# Patient Record
Sex: Female | Born: 1940
Health system: Southern US, Community
[De-identification: ages and names within clinical notes are randomized; demographics above are authoritative.]

## PROBLEM LIST (undated history)

## (undated) DIAGNOSIS — M51369 Other intervertebral disc degeneration, lumbar region without mention of lumbar back pain or lower extremity pain: Secondary | ICD-10-CM

## (undated) DIAGNOSIS — D1803 Hemangioma of intra-abdominal structures: Secondary | ICD-10-CM

## (undated) DIAGNOSIS — G629 Polyneuropathy, unspecified: Secondary | ICD-10-CM

## (undated) DIAGNOSIS — Z9889 Other specified postprocedural states: Secondary | ICD-10-CM

## (undated) DIAGNOSIS — I10 Essential (primary) hypertension: Secondary | ICD-10-CM

## (undated) DIAGNOSIS — M5136 Other intervertebral disc degeneration, lumbar region: Secondary | ICD-10-CM

## (undated) DIAGNOSIS — K5790 Diverticulosis of intestine, part unspecified, without perforation or abscess without bleeding: Secondary | ICD-10-CM

## (undated) DIAGNOSIS — C801 Malignant (primary) neoplasm, unspecified: Secondary | ICD-10-CM

## (undated) DIAGNOSIS — K429 Umbilical hernia without obstruction or gangrene: Secondary | ICD-10-CM

## (undated) DIAGNOSIS — G5760 Lesion of plantar nerve, unspecified lower limb: Secondary | ICD-10-CM

## (undated) DIAGNOSIS — E785 Hyperlipidemia, unspecified: Secondary | ICD-10-CM

## (undated) DIAGNOSIS — D682 Hereditary deficiency of other clotting factors: Secondary | ICD-10-CM

## (undated) DIAGNOSIS — D649 Anemia, unspecified: Secondary | ICD-10-CM

## (undated) DIAGNOSIS — Z9289 Personal history of other medical treatment: Secondary | ICD-10-CM

## (undated) DIAGNOSIS — G4700 Insomnia, unspecified: Secondary | ICD-10-CM

## (undated) DIAGNOSIS — F419 Anxiety disorder, unspecified: Secondary | ICD-10-CM

## (undated) DIAGNOSIS — K5792 Diverticulitis of intestine, part unspecified, without perforation or abscess without bleeding: Secondary | ICD-10-CM

## (undated) DIAGNOSIS — T8859XA Other complications of anesthesia, initial encounter: Secondary | ICD-10-CM

## (undated) DIAGNOSIS — S92901A Unspecified fracture of right foot, initial encounter for closed fracture: Secondary | ICD-10-CM

## (undated) DIAGNOSIS — M1990 Unspecified osteoarthritis, unspecified site: Secondary | ICD-10-CM

## (undated) DIAGNOSIS — R112 Nausea with vomiting, unspecified: Secondary | ICD-10-CM

## (undated) DIAGNOSIS — J189 Pneumonia, unspecified organism: Secondary | ICD-10-CM

## (undated) DIAGNOSIS — M797 Fibromyalgia: Secondary | ICD-10-CM

## (undated) DIAGNOSIS — Z8719 Personal history of other diseases of the digestive system: Secondary | ICD-10-CM

## (undated) DIAGNOSIS — T4145XA Adverse effect of unspecified anesthetic, initial encounter: Secondary | ICD-10-CM

## (undated) DIAGNOSIS — F32A Depression, unspecified: Secondary | ICD-10-CM

## (undated) DIAGNOSIS — N816 Rectocele: Secondary | ICD-10-CM

## (undated) DIAGNOSIS — E611 Iron deficiency: Secondary | ICD-10-CM

## (undated) DIAGNOSIS — K589 Irritable bowel syndrome without diarrhea: Secondary | ICD-10-CM

## (undated) DIAGNOSIS — K219 Gastro-esophageal reflux disease without esophagitis: Secondary | ICD-10-CM

## (undated) DIAGNOSIS — B2790 Infectious mononucleosis, unspecified without complication: Secondary | ICD-10-CM

## (undated) DIAGNOSIS — F329 Major depressive disorder, single episode, unspecified: Secondary | ICD-10-CM

## (undated) DIAGNOSIS — N3010 Interstitial cystitis (chronic) without hematuria: Secondary | ICD-10-CM

## (undated) HISTORY — DX: Anemia, unspecified: D64.9

## (undated) HISTORY — DX: Gastro-esophageal reflux disease without esophagitis: K21.9

## (undated) HISTORY — PX: RIGHT OOPHORECTOMY: SHX2359

## (undated) HISTORY — DX: Iron deficiency: E61.1

## (undated) HISTORY — DX: Depression, unspecified: F32.A

## (undated) HISTORY — DX: Insomnia, unspecified: G47.00

## (undated) HISTORY — DX: Malignant (primary) neoplasm, unspecified: C80.1

## (undated) HISTORY — PX: CHOLECYSTECTOMY: SHX55

## (undated) HISTORY — DX: Irritable bowel syndrome, unspecified: K58.9

## (undated) HISTORY — DX: Diverticulitis of intestine, part unspecified, without perforation or abscess without bleeding: K57.92

## (undated) HISTORY — PX: OOPHORECTOMY: SHX86

## (undated) HISTORY — DX: Umbilical hernia without obstruction or gangrene: K42.9

## (undated) HISTORY — PX: SHOULDER SURGERY: SHX246

## (undated) HISTORY — PX: TONSILLECTOMY: SUR1361

## (undated) HISTORY — PX: HIATAL HERNIA REPAIR: SHX195

## (undated) HISTORY — DX: Hemangioma of intra-abdominal structures: D18.03

## (undated) HISTORY — PX: GANGLION CYST EXCISION: SHX1691

## (undated) HISTORY — DX: Major depressive disorder, single episode, unspecified: F32.9

## (undated) HISTORY — DX: Other intervertebral disc degeneration, lumbar region without mention of lumbar back pain or lower extremity pain: M51.369

## (undated) HISTORY — DX: Lesion of plantar nerve, unspecified lower limb: G57.60

## (undated) HISTORY — DX: Anxiety disorder, unspecified: F41.9

## (undated) HISTORY — PX: HEMORRHOID SURGERY: SHX153

## (undated) HISTORY — DX: Unspecified fracture of right foot, initial encounter for closed fracture: S92.901A

## (undated) HISTORY — DX: Infectious mononucleosis, unspecified without complication: B27.90

## (undated) HISTORY — DX: Other intervertebral disc degeneration, lumbar region: M51.36

## (undated) HISTORY — DX: Diverticulosis of intestine, part unspecified, without perforation or abscess without bleeding: K57.90

## (undated) HISTORY — PX: LAPAROSCOPIC INCISIONAL / UMBILICAL / VENTRAL HERNIA REPAIR: SUR789

## (undated) HISTORY — PX: HERNIA REPAIR: SHX51

## (undated) HISTORY — DX: Rectocele: N81.6

## (undated) HISTORY — PX: LAPAROSCOPIC ESOPHAGOGASTRIC FUNDOPLASTY: SUR767

## (undated) HISTORY — DX: Essential (primary) hypertension: I10

## (undated) HISTORY — DX: Hyperlipidemia, unspecified: E78.5

## (undated) HISTORY — DX: Hereditary deficiency of other clotting factors: D68.2

## (undated) HISTORY — DX: Unspecified osteoarthritis, unspecified site: M19.90

## (undated) HISTORY — DX: Polyneuropathy, unspecified: G62.9

## (undated) HISTORY — DX: Interstitial cystitis (chronic) without hematuria: N30.10

## (undated) SURGERY — COLONOSCOPY WITH PROPOFOL
Anesthesia: Monitor Anesthesia Care

---

## 1978-05-04 HISTORY — PX: OTHER SURGICAL HISTORY: SHX169

## 1993-05-04 DIAGNOSIS — S92901A Unspecified fracture of right foot, initial encounter for closed fracture: Secondary | ICD-10-CM

## 1993-05-04 HISTORY — DX: Unspecified fracture of right foot, initial encounter for closed fracture: S92.901A

## 1997-05-04 DIAGNOSIS — D1803 Hemangioma of intra-abdominal structures: Secondary | ICD-10-CM

## 1997-05-04 HISTORY — DX: Hemangioma of intra-abdominal structures: D18.03

## 1997-08-29 ENCOUNTER — Other Ambulatory Visit: Admission: RE | Admit: 1997-08-29 | Discharge: 1997-08-29 | Payer: Self-pay | Admitting: Internal Medicine

## 1998-02-15 ENCOUNTER — Ambulatory Visit (HOSPITAL_COMMUNITY): Admission: RE | Admit: 1998-02-15 | Discharge: 1998-02-15 | Payer: Self-pay | Admitting: *Deleted

## 1998-06-27 ENCOUNTER — Other Ambulatory Visit: Admission: RE | Admit: 1998-06-27 | Discharge: 1998-06-27 | Payer: Self-pay | Admitting: *Deleted

## 1998-08-29 ENCOUNTER — Emergency Department (HOSPITAL_COMMUNITY): Admission: EM | Admit: 1998-08-29 | Discharge: 1998-08-29 | Payer: Self-pay | Admitting: Emergency Medicine

## 1998-08-29 ENCOUNTER — Encounter: Payer: Self-pay | Admitting: Emergency Medicine

## 1999-04-08 ENCOUNTER — Encounter: Payer: Self-pay | Admitting: *Deleted

## 1999-04-08 ENCOUNTER — Ambulatory Visit (HOSPITAL_COMMUNITY): Admission: RE | Admit: 1999-04-08 | Discharge: 1999-04-08 | Payer: Self-pay | Admitting: *Deleted

## 1999-04-11 ENCOUNTER — Emergency Department (HOSPITAL_COMMUNITY): Admission: EM | Admit: 1999-04-11 | Discharge: 1999-04-11 | Payer: Self-pay | Admitting: Emergency Medicine

## 1999-04-11 ENCOUNTER — Encounter: Payer: Self-pay | Admitting: Emergency Medicine

## 1999-04-15 ENCOUNTER — Ambulatory Visit (HOSPITAL_COMMUNITY): Admission: RE | Admit: 1999-04-15 | Discharge: 1999-04-15 | Payer: Self-pay | Admitting: General Surgery

## 1999-06-02 ENCOUNTER — Encounter: Admission: RE | Admit: 1999-06-02 | Discharge: 1999-06-02 | Payer: Self-pay | Admitting: Internal Medicine

## 1999-06-02 ENCOUNTER — Encounter: Payer: Self-pay | Admitting: Internal Medicine

## 1999-07-31 ENCOUNTER — Other Ambulatory Visit: Admission: RE | Admit: 1999-07-31 | Discharge: 1999-07-31 | Payer: Self-pay | Admitting: *Deleted

## 1999-09-08 ENCOUNTER — Encounter: Admission: RE | Admit: 1999-09-08 | Discharge: 1999-12-07 | Payer: Self-pay | Admitting: Internal Medicine

## 1999-10-28 ENCOUNTER — Encounter: Admission: RE | Admit: 1999-10-28 | Discharge: 2000-01-26 | Payer: Self-pay | Admitting: Orthopedic Surgery

## 2000-04-29 ENCOUNTER — Encounter: Payer: Self-pay | Admitting: *Deleted

## 2000-04-29 ENCOUNTER — Ambulatory Visit (HOSPITAL_COMMUNITY): Admission: RE | Admit: 2000-04-29 | Discharge: 2000-04-29 | Payer: Self-pay | Admitting: *Deleted

## 2000-06-13 ENCOUNTER — Ambulatory Visit (HOSPITAL_BASED_OUTPATIENT_CLINIC_OR_DEPARTMENT_OTHER): Admission: RE | Admit: 2000-06-13 | Discharge: 2000-06-13 | Payer: Self-pay | Admitting: Internal Medicine

## 2000-06-24 ENCOUNTER — Ambulatory Visit (HOSPITAL_COMMUNITY): Admission: RE | Admit: 2000-06-24 | Discharge: 2000-06-24 | Payer: Self-pay | Admitting: Internal Medicine

## 2000-06-24 ENCOUNTER — Encounter: Payer: Self-pay | Admitting: Internal Medicine

## 2000-08-31 ENCOUNTER — Ambulatory Visit (HOSPITAL_BASED_OUTPATIENT_CLINIC_OR_DEPARTMENT_OTHER): Admission: RE | Admit: 2000-08-31 | Discharge: 2000-08-31 | Payer: Self-pay | Admitting: Orthopaedic Surgery

## 2000-10-22 ENCOUNTER — Other Ambulatory Visit: Admission: RE | Admit: 2000-10-22 | Discharge: 2000-10-22 | Payer: Self-pay | Admitting: *Deleted

## 2001-07-29 ENCOUNTER — Encounter: Admission: RE | Admit: 2001-07-29 | Discharge: 2001-07-29 | Payer: Self-pay | Admitting: Internal Medicine

## 2001-07-29 ENCOUNTER — Encounter: Payer: Self-pay | Admitting: Internal Medicine

## 2001-12-05 ENCOUNTER — Other Ambulatory Visit: Admission: RE | Admit: 2001-12-05 | Discharge: 2001-12-05 | Payer: Self-pay | Admitting: Obstetrics and Gynecology

## 2002-02-28 ENCOUNTER — Ambulatory Visit (HOSPITAL_COMMUNITY): Admission: RE | Admit: 2002-02-28 | Discharge: 2002-02-28 | Payer: Self-pay | Admitting: Obstetrics and Gynecology

## 2002-02-28 ENCOUNTER — Encounter: Payer: Self-pay | Admitting: Obstetrics and Gynecology

## 2002-04-10 ENCOUNTER — Encounter (INDEPENDENT_AMBULATORY_CARE_PROVIDER_SITE_OTHER): Payer: Self-pay

## 2002-04-10 ENCOUNTER — Ambulatory Visit (HOSPITAL_COMMUNITY): Admission: RE | Admit: 2002-04-10 | Discharge: 2002-04-10 | Payer: Self-pay | Admitting: Gastroenterology

## 2003-03-07 ENCOUNTER — Ambulatory Visit (HOSPITAL_COMMUNITY): Admission: RE | Admit: 2003-03-07 | Discharge: 2003-03-07 | Payer: Self-pay | Admitting: Obstetrics and Gynecology

## 2003-03-20 ENCOUNTER — Other Ambulatory Visit: Admission: RE | Admit: 2003-03-20 | Discharge: 2003-03-20 | Payer: Self-pay | Admitting: Obstetrics and Gynecology

## 2003-05-05 DIAGNOSIS — K5792 Diverticulitis of intestine, part unspecified, without perforation or abscess without bleeding: Secondary | ICD-10-CM

## 2003-05-05 DIAGNOSIS — K5790 Diverticulosis of intestine, part unspecified, without perforation or abscess without bleeding: Secondary | ICD-10-CM

## 2003-05-05 HISTORY — DX: Diverticulitis of intestine, part unspecified, without perforation or abscess without bleeding: K57.92

## 2003-05-05 HISTORY — DX: Diverticulosis of intestine, part unspecified, without perforation or abscess without bleeding: K57.90

## 2003-05-08 ENCOUNTER — Encounter (INDEPENDENT_AMBULATORY_CARE_PROVIDER_SITE_OTHER): Payer: Self-pay | Admitting: Specialist

## 2003-05-08 ENCOUNTER — Ambulatory Visit (HOSPITAL_COMMUNITY): Admission: RE | Admit: 2003-05-08 | Discharge: 2003-05-08 | Payer: Self-pay | Admitting: Orthopaedic Surgery

## 2003-05-08 ENCOUNTER — Ambulatory Visit (HOSPITAL_BASED_OUTPATIENT_CLINIC_OR_DEPARTMENT_OTHER): Admission: RE | Admit: 2003-05-08 | Discharge: 2003-05-08 | Payer: Self-pay | Admitting: Orthopaedic Surgery

## 2003-09-18 ENCOUNTER — Observation Stay (HOSPITAL_COMMUNITY): Admission: RE | Admit: 2003-09-18 | Discharge: 2003-09-19 | Payer: Self-pay | Admitting: Obstetrics and Gynecology

## 2003-09-18 ENCOUNTER — Encounter (INDEPENDENT_AMBULATORY_CARE_PROVIDER_SITE_OTHER): Payer: Self-pay | Admitting: Specialist

## 2003-10-11 ENCOUNTER — Encounter: Admission: RE | Admit: 2003-10-11 | Discharge: 2004-01-09 | Payer: Self-pay | Admitting: Internal Medicine

## 2004-03-28 ENCOUNTER — Ambulatory Visit (HOSPITAL_COMMUNITY): Admission: RE | Admit: 2004-03-28 | Discharge: 2004-03-28 | Payer: Self-pay | Admitting: Obstetrics and Gynecology

## 2005-05-14 ENCOUNTER — Ambulatory Visit (HOSPITAL_COMMUNITY): Admission: RE | Admit: 2005-05-14 | Discharge: 2005-05-14 | Payer: Self-pay | Admitting: Obstetrics and Gynecology

## 2005-05-22 ENCOUNTER — Ambulatory Visit (HOSPITAL_COMMUNITY): Admission: RE | Admit: 2005-05-22 | Discharge: 2005-05-22 | Payer: Self-pay | Admitting: Internal Medicine

## 2006-05-17 ENCOUNTER — Ambulatory Visit (HOSPITAL_COMMUNITY): Admission: RE | Admit: 2006-05-17 | Discharge: 2006-05-17 | Payer: Self-pay | Admitting: Obstetrics and Gynecology

## 2006-06-23 ENCOUNTER — Encounter: Admission: RE | Admit: 2006-06-23 | Discharge: 2006-06-23 | Payer: Self-pay | Admitting: Internal Medicine

## 2006-08-21 ENCOUNTER — Encounter: Admission: RE | Admit: 2006-08-21 | Discharge: 2006-08-21 | Payer: Self-pay | Admitting: Orthopaedic Surgery

## 2006-10-29 ENCOUNTER — Encounter: Admission: RE | Admit: 2006-10-29 | Discharge: 2006-10-29 | Payer: Self-pay | Admitting: Gastroenterology

## 2007-04-11 ENCOUNTER — Ambulatory Visit (HOSPITAL_COMMUNITY): Admission: RE | Admit: 2007-04-11 | Discharge: 2007-04-11 | Payer: Self-pay | Admitting: General Surgery

## 2007-05-10 ENCOUNTER — Encounter: Admission: RE | Admit: 2007-05-10 | Discharge: 2007-05-10 | Payer: Self-pay | Admitting: Gastroenterology

## 2007-05-26 ENCOUNTER — Ambulatory Visit (HOSPITAL_COMMUNITY): Admission: RE | Admit: 2007-05-26 | Discharge: 2007-05-26 | Payer: Self-pay | Admitting: Obstetrics and Gynecology

## 2007-06-01 ENCOUNTER — Ambulatory Visit (HOSPITAL_COMMUNITY): Admission: RE | Admit: 2007-06-01 | Discharge: 2007-06-01 | Payer: Self-pay | Admitting: General Surgery

## 2007-06-22 ENCOUNTER — Encounter (INDEPENDENT_AMBULATORY_CARE_PROVIDER_SITE_OTHER): Payer: Self-pay | Admitting: General Surgery

## 2007-06-23 ENCOUNTER — Inpatient Hospital Stay (HOSPITAL_COMMUNITY): Admission: AD | Admit: 2007-06-23 | Discharge: 2007-06-27 | Payer: Self-pay | Admitting: General Surgery

## 2007-07-01 ENCOUNTER — Inpatient Hospital Stay (HOSPITAL_COMMUNITY): Admission: AD | Admit: 2007-07-01 | Discharge: 2007-07-09 | Payer: Self-pay | Admitting: General Surgery

## 2007-07-13 ENCOUNTER — Emergency Department (HOSPITAL_COMMUNITY): Admission: EM | Admit: 2007-07-13 | Discharge: 2007-07-14 | Payer: Self-pay | Admitting: Emergency Medicine

## 2007-07-17 ENCOUNTER — Inpatient Hospital Stay (HOSPITAL_COMMUNITY): Admission: EM | Admit: 2007-07-17 | Discharge: 2007-07-26 | Payer: Self-pay | Admitting: Emergency Medicine

## 2007-07-17 ENCOUNTER — Ambulatory Visit: Payer: Self-pay | Admitting: Internal Medicine

## 2007-07-20 ENCOUNTER — Encounter (INDEPENDENT_AMBULATORY_CARE_PROVIDER_SITE_OTHER): Payer: Self-pay | Admitting: Surgery

## 2007-10-31 ENCOUNTER — Encounter: Admission: RE | Admit: 2007-10-31 | Discharge: 2007-10-31 | Payer: Self-pay | Admitting: Gastroenterology

## 2008-05-30 IMAGING — XA IR REPLACE G/J TUBE W/ FLUORO
1 series · 2 of 2 positions shown · non-contrast
Comparison: none

CLINICAL DATA: Gastrojejunostomy tube is occluded.  
 GASTROJEJUNOSTOMY TUBE EXCHANGE 07/21/07 AT 4991 HOURS:
 Procedure:  The gastrojejunostomy tube was prepped and draped in a sterile fashion and lidocaine was utilized for local anesthesia.  It was cut and exchanged over a stiff glidewire for a new 28 French gastrojejunostomy tube.  The tip was positioned in the jejunum.  The gastric tip was positioned in the antrum of the stomach.  Contrast was injected into each port opacifying the stomach and jejunum.  No complications.

[Series 1000: run · 0.15mm/px · 2 of 2 slices shown]
[im 1/2]
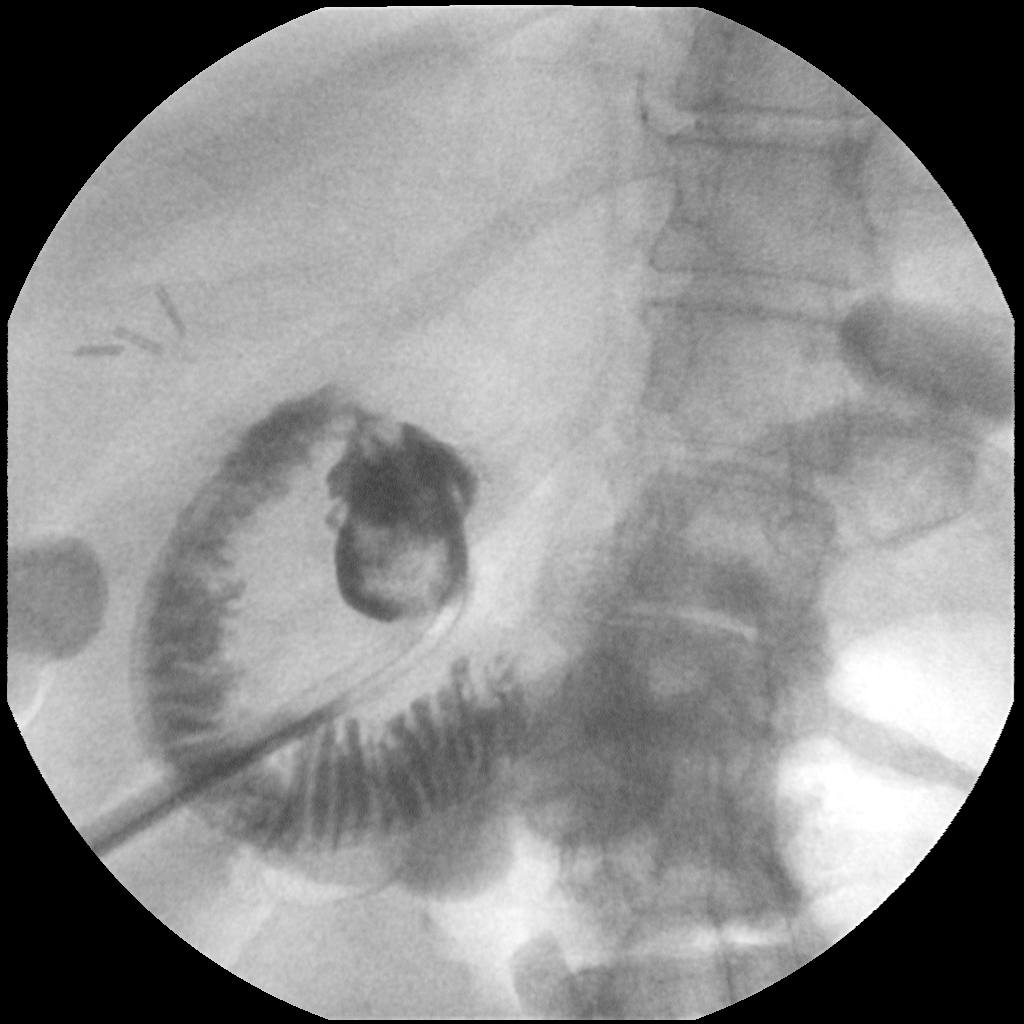
[im 2/2]
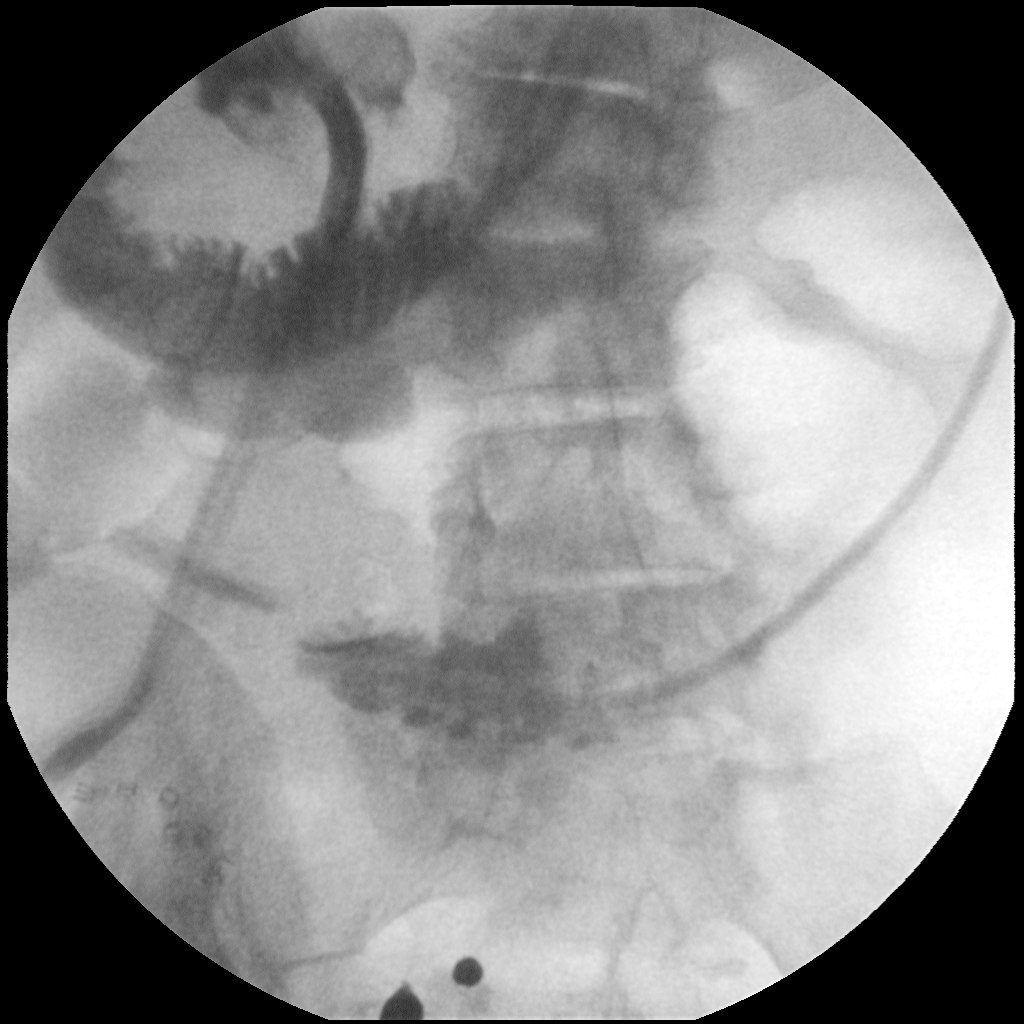

[2 of 2 positions shown; findings below may reference images not displayed]

FINDINGS: The image demonstrates exchange of the gastrojejunostomy tube.
IMPRESSION: Successful gastrojejunostomy exchange.

## 2008-10-18 ENCOUNTER — Ambulatory Visit (HOSPITAL_COMMUNITY): Admission: RE | Admit: 2008-10-18 | Discharge: 2008-10-18 | Payer: Self-pay | Admitting: Obstetrics and Gynecology

## 2009-08-16 ENCOUNTER — Ambulatory Visit (HOSPITAL_BASED_OUTPATIENT_CLINIC_OR_DEPARTMENT_OTHER): Admission: RE | Admit: 2009-08-16 | Discharge: 2009-08-16 | Payer: Self-pay | Admitting: Urology

## 2010-03-03 ENCOUNTER — Ambulatory Visit (HOSPITAL_COMMUNITY): Admission: RE | Admit: 2010-03-03 | Discharge: 2010-03-03 | Payer: Self-pay | Admitting: Obstetrics and Gynecology

## 2010-05-04 DIAGNOSIS — K429 Umbilical hernia without obstruction or gangrene: Secondary | ICD-10-CM

## 2010-05-04 HISTORY — DX: Umbilical hernia without obstruction or gangrene: K42.9

## 2010-05-25 ENCOUNTER — Encounter: Payer: Self-pay | Admitting: Obstetrics and Gynecology

## 2010-05-25 ENCOUNTER — Encounter: Payer: Self-pay | Admitting: Gastroenterology

## 2010-07-22 LAB — GLUCOSE, CAPILLARY: Glucose-Capillary: 106 mg/dL — ABNORMAL HIGH (ref 70–99)

## 2010-07-22 LAB — POCT I-STAT 4, (NA,K, GLUC, HGB,HCT)
Hemoglobin: 14.3 g/dL (ref 12.0–15.0)
Sodium: 140 mEq/L (ref 135–145)

## 2010-09-16 NOTE — Op Note (Signed)
[name], Gross                 ACCOUNT NO.:  [id number]   MEDICAL RECORD NO.:  [id number]          PATIENT TYPE:  INP   LOCATION:  1335                         FACILITY:  [facility]   PHYSICIAN:  Clovis Pu. Cornett, M.D.DATE OF BIRTH:  1941-03-05   DATE OF PROCEDURE:  07/20/2007  DATE OF DISCHARGE:                               OPERATIVE REPORT   PREOPERATIVE DIAGNOSIS:  Anal canal and rectal pain.   POSTOPERATIVE DIAGNOSIS:  Thrombosed grade 3 prolapsed internal and  external hemorrhoids involving the left lateral column of the anal  canal.   PROCEDURE:  1. Exam under anesthesia.  2. Excision of thrombosed grade 3 left lateral internal/external      thrombosed hemorrhoids.   SURGEON:  Maisie Fus A. Cornett, M.D.   ANESTHESIA:  General anesthesia with 0.25% local anesthesia.   ESTIMATED BLOOD LOSS:  50 mL.   SPECIMEN:  Hemorrhoidal tissue which was showing signs of thrombosis  from the left lateral posterior column of the anal canal.   INDICATIONS FOR PROCEDURE:  The patient is a 70 year old female who  underwent a laparoscopic hiatal hernia repair with placement of a  gastrostomy tube last month by Dr. Avel Peace.  She was admitted  early Sunday morning with abdominal pain and no bowel movement for seven  days.  I was asked to see her by the internal medicine doctor covering  her for that day for that.  She was also complaining of rectal pain.  Her abdominal pain had gone away, but she still had issues of  constipation but she was having no nausea, vomiting, or abdominal pain  at that point.  She was complaining mostly of rectal pain.  On  examination, she had a column of hemorrhoidal disease that looked  inflamed but did not appear thrombosed on external examination.  We  elected to manage her medically for a couple of days to see if we could  help with this, but her pain did not improve and I recommended exam  under anesthesia to exclude an abscess or any necrotic  internal  component that I could not see on exam.  She is brought to the operating  room today for persistent anal pain.   DESCRIPTION OF PROCEDURE:  The patient was brought to the operating room  and placed supine.  After induction of general anesthesia, she was  placed in lithotomy and the perianal canal was prepped and draped in a  sterile fashion.  Of note, she had a tremendous amount of hard  inspissated stool in the rectum which I irrigated and went ahead and  flushed this out initially.  Once I was able to irrigate out her rectum,  she had a very large pile of thrombosed grade 3 prolapsed left lateral  internal and external hemorrhoids.  I felt that excising this would help  with examination with the anoscope.  I placed a stitch in the apex of  the hemorrhoid and then used a large curved Mayo, laid these flat so as  to not cut the internal sphincter mechanism, and excised this area to  include a  thrombosed external component with it once we opened this.  After removing the tissue and clot, we oversewed it with 3-0 Monocryl.  Upon examination, there was another cluster of grade 2 hemorrhoids that  actually had clotted with an internal component in the right posterior  region.  I placed a rubber band on this under direct vision.  There was  still some swelling in this area, probably from hematoma, and I  oversewed this with 3-0 Monocryl with adequate result.  The remainder of  her anal canal examination did not reveal any other significant  abnormality.  She had some hemorrhoidal tissue in the right lateral  region and this did not appear to be an issue today.  After removing the  anoscope, I placed some gauze packing, waited about five minutes,  rechecked it, and there was no signs of bleeding.  I then infiltrated  the region with 2% lidocaine jelly and placed a packing consisting of  Gelfoam and Surgicel in the anal canal.  Hemostasis was excellent.  All  final counts of sponge,  needle and instruments were found to be correct  this portion of the case.  The patient was then awakened and taken to  recovery in satisfactory condition.      Thomas A. Cornett, M.D.  Electronically Signed     TAC/MEDQ  D:  07/20/2007  T:  07/20/2007  Job:  [id number]   cc:   Thora Lance, M.D.  Fax: [phone]   Adolph Pollack, M.D.  [address], [address]  [place]  [place] [address]

## 2010-09-16 NOTE — H&P (Signed)
Annette, Gross                 ACCOUNT NO.:  [id number]   MEDICAL RECORD NO.:  [id number]          PATIENT TYPE:  OIB   LOCATION:  1528                         FACILITY:  [facility]   PHYSICIAN:  Adolph Pollack, M.D.DATE OF BIRTH:  [birth year]   DATE OF ADMISSION:  06/22/2007  DATE OF DISCHARGE:                              HISTORY & PHYSICAL   REASON:  Repair of large hiatal hernia.   HISTORY OF PRESENT ILLNESS:  Annette Gross is a 70 year old female who has  had a longstanding hiatal hernia.  She has been becoming more  symptomatic with intermittent dysphagia and reflux.  Proton pump  inhibitors have helped her reflux,  but she still has intermittent  feelings of dysphagia.  An upper GI demonstrated a combination of a  sliding and paraesophageal hernia without any complicating features.  At  least 50% of the stomach was involved in the hernia.  A 13-mm tablet did  pass through easily.  No evidence of volvulus.  We attempted to get  manometry, but because of the way hernia was the probe could not be  placed in the proper position.  I did get a gastric-emptying study on  her, and this showed  some delayed emptying.  I sent her back to Dr.  Laural Benes, but he felt that she did not need to be started on Reglan.  I  had long discussions with her.  The plan is for a laparoscopic hiatal  hernia repair with fundoplication and placement of a gastrostomy.  The  procedure and the risks have been discussed with her at length  preoperatively.   PAST MEDICAL HISTORY:  1. Hypertension.  2. Longstanding hiatal hernia with gastroesophageal reflux disease.  3. Type 2 diabetes.  4. Hypercholesterolemia.  5. 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  6. Asthma, bronchitis.  7. History of factor V deficiency although she has not had any      bleeding with recent surgeries.  8. Depression.  9. Mononucleosis.  10.Iron deficiency anemia.  11.Hepatic hemangioma.   PREVIOUS  OPERATIONS:  1. Tonsillectomy.  2. Hemorrhoidectomy.  3. Cholecystectomy.  4. Left oophorectomy.  5. Laparoscopic umbilical hernia repair.  6. Right shoulder surgery.  7. Removal of right ganglion cyst.  8. Right salpingo-oophorectomy.   ALLERGIES:  X-RAY DYE.  ALSO SHE HAS INTOLERANCE TO SHE STATES VICODIN  AND OXYCODONE.  SHE ALSO GETS ITCHING WITH SULFA MEDICINES.   MEDICATIONS:  Diovan HCT, Zyrtec, diclofenac, Protonix, Tylenol,  multivitamin, vitamin C, aspirin, albuterol inhaler, Darvocet as needed  for pain.   SOCIAL HISTORY:  She is married and works as a Runner, broadcasting/film/video for [employer].  No tobacco or alcohol use.   FAMILY HISTORY:  Notable for heart disease.   PHYSICAL EXAMINATION:  VITAL SIGNS:  Temperature is 98 degrees, pulse  102, blood pressure 111/68.  GENERAL:  An overweight female.  She is in no acute distress, pleasant  and cooperative.  HEENT:  Extraocular motions intact.  No icterus.  NECK:  Supple without masses.  RESPIRATORY:  The breath sounds are equal  and clear.  Respirations are  unlabored.  CARDIOVASCULAR:  Demonstrates regular rate, regular rhythm.  No murmur.  ABDOMEN:  Soft.  Small scars noted.  Nontender, nondistended.  No  organomegaly or masses.  EXTREMITIES:  SCDs on.   IMPRESSION:  Large longstanding hiatal hernia with some delayed gastric  emptying, which likely is chronic although she does not have symptoms of  this.   PLAN:  Laparoscopic, possible open, hiatal hernia repair with Nissen  fundoplication and gastrostomy.      Adolph Pollack, M.D.  Electronically Signed     TJR/MEDQ  D:  06/22/2007  T:  06/23/2007  Job:  [id number]

## 2010-09-16 NOTE — H&P (Signed)
NAME:  Annette Gross, Annette Gross                 ACCOUNT NO.:  [id number]   MEDICAL RECORD NO.:  [id number]          PATIENT TYPE:  INP   LOCATION:                               FACILITY:  [facility]   PHYSICIAN:  Donalynn Furlong, MD      DATE OF BIRTH:  [birth year]   DATE OF ADMISSION:  07/16/2007  DATE OF DISCHARGE:                              HISTORY & PHYSICAL   PRIMARY CARE PHYSICIAN:  Thora Lance, M.D.   CHIEF COMPLAINT:  Rectal pain and nausea along with urinary complaints.   HISTORY OF PRESENT ILLNESS:  Ms. Blunck is a 70 year old female who  presented to the ER tonight with the complaint of rectal pain starting  at 10:00 yesterday morning. She mentioned that she was hurting in her  rectum every time when she tried to pass bowel movement and felt like  she is having hemorrhoids. She does have a history of hemorrhoids. She  mentioned that she had no bowel movements for the last seven days; that  is why she took some laxatives and suppository the night before  yesterday, and she had some bowel movement yesterday morning when it  started hurting in her rectum. She did have lower abdominal cramps at  the same time. She complained of nausea. She mentions that she had some  burning pain while passing urine but no fever, chills, or other  symptoms. She was treated for UTI with Cipro by Dr. Abbey Chatters about a  week ago. She finished the course of therapy but did not get better. She  saw Dr. Abbey Chatters on Friday, and she received one antibiotic (name not  known) from him. In the ER, the patient got one dose of IV Rocephin. The  patient is stable at this time. The patient denies any cough, shortness  of breath, chest pain, headache, or other symptoms.   PAST MEDICAL HISTORY:  1. Chronic gastroparesis with large hiatal hernia.  2. Type 2 diabetes mellitus.  3. Hypertension.  4. Hyperlipidemia.  5. Degenerative disease of back with spinal stenosis.  6. Asthma/bronchitis.  7. History of Factor V  deficiency.  8. Depression.  9. Iron-deficiency anemia.  10.Hepatic angioma.   PAST SURGERIES:  1. Tonsillectomy.  2. Hemorrhoidectomy.  3. Cholecystectomy.  4. Left oophorectomy.  5. Laparoscopic umbilical hernia repair.  6. Right shoulder surgery.  7. Removal of right ganglion cyst.  8. Right salpingo-oophorectomy.  9. She did have surgery for hiatal hernia which was laparoscopic      hiatal hernia repair with Nissen fundoplication gastrostomy and J-      tube placement in the month of February.   FAMILY HISTORY:  Nothing contributory.   SOCIAL HISTORY:  The patient is married. Lives with her husband. Works  as a Runner, broadcasting/film/video in [employer]. No alcohol, drug,  tobacco use.   ALLERGIES:  INCLUDE CONTRAST MEDIA, SULFA, CODEINE, OXYCODONE,  HYDROCODONE.   REVIEW OF SYSTEMS:  Positive as per HPI. Otherwise negative review of  systems done for 14 systems.   PHYSICAL EXAMINATION:  VITAL SIGNS:  Blood pressure 147/88, pulse  117,  respirations 18, temperature 98.2, oxygen saturation 100% on room air.  GENERAL EXAM:  Alert and oriented x3. Not in acute distress.  HEAD:  Normocephalic, nontraumatic. EYES:  Pupils are equal, round, and  reactive to light and accommodation. Extraocular muscles intact. ORAL  CAVITY:  Oral mucosa moist. No thrush noted.  NECK:  No thyromegaly or JVD.  CARDIOVASCULAR:  S1/S2 regular. Tachycardia. No murmur, rub, gallop  appreciated.  RESPIRATORY SYSTEM:  Lungs clear to auscultation bilaterally. Good  bilateral air entry.  ABDOMEN:  Nondistended. The patient is obese. Mild tenderness over the  lower part of the abdomen over the suprapubic area. No organomegaly. J  tube identified. No guarding or rigidity.  EXTREMITIES:  No clubbing, cyanosis, or edema. Pulses palpable in all  four extremities.  SKIN:  No rash or bruises.  NEUROLOGIC EXAM:  Shows intact cranial nerves, sensation, and reflexes.  PSYCHIATRIC EXAM:  Shows no depression  or anxiety.   LABORATORY DATA:  WBC 14.7, hemoglobin 13.5, platelets 553. Sodium 141,  potassium 3.7, chloride 98, bicarbonate 32, glucose 166, BUN 33,  creatinine 0.83, calcium 10, total protein 7.5, albumin 3.7, AST 34, ALT  29, alkaline phosphatase 113, bilirubin 0.7. GFR more than 60. Urine  showed too numerous to count and many bacteria. Urine color is amber,  appearance turbid, along with moderate hemoglobin, trace ketones, total  protein 100, nitrite positive, leukocyte esterase large. CT abdomen and  pelvis obtained which does not show any acute abnormal findings. CT  abdomen and pelvis reading shows no intestinal obstruction. X-ray of  abdomen shows no acute findings. Chest x-ray shows no pneumonia.  Unremarkable bowel-gas pattern on the abdominal x-ray.   ASSESSMENT AND PLAN:  1. Urinary tract infection.  2. Rectal pain with possible hemorrhoids. No blood in the stool noted.  3. Chronic gastroparesis.  4. Hypertension.  5. Diabetes mellitus.  6. Degenerative joint disease.  7. Hyperlipidemia.  8. Asthma/bronchitis.  9. Factor V deficiency.  10.Depression.  11.Iron-deficiency anemia.   PLAN:  We will admit patient to telemetry bed under Dr. Kirby Funk. We  will check CBC, CMP, magnesium, and phosphorus in the morning. We will  check blood culture, urinalysis, urine culture now. We will continue IV  Rocephin which was started by ER physician. We will start IV hydration  with normal saline at 60 mL per hour. We will give pain medication,  Darvocet-N at home dose. Will give Protonix, Zyrtec also. Will start  Lovenox for DVT prophylaxis. We will consult Dr. Abbey Chatters in the  morning. Further workup according to the lab pending.      Donalynn Furlong, MD  Electronically Signed     TVP/MEDQ  D:  07/17/2007  T:  07/17/2007  Job:  [id number]   cc:   Thora Lance, M.D.  Fax: [phone]

## 2010-09-16 NOTE — Op Note (Signed)
[name], Gross                 ACCOUNT NO.:  [id number]   MEDICAL RECORD NO.:  [id number]          PATIENT TYPE:  OIB   LOCATION:  1528                         FACILITY:  [facility]   PHYSICIAN:  Adolph Pollack, M.D.DATE OF BIRTH:  08/19/1940   DATE OF PROCEDURE:  06/22/2007  DATE OF DISCHARGE:                               OPERATIVE REPORT   PREOPERATIVE DIAGNOSIS:  Large hiatal hernia and gastroesophageal reflux  disease.   POSTOPERATIVE DIAGNOSIS:  Large hiatal hernia and gastroesophageal  reflux disease.   PROCEDURE:  1. Laparoscopic hiatal hernia repair with Nissen fundoplication.  2. Upper endoscopy.  3. Laparoscopic gastrostomy (#24-French Foley).   SURGEON:  Adolph Pollack, M.D.   ASSISTANT:  Ollen Gross. Vernell Morgans, M.D.   ANESTHESIA:  General.   INDICATION:  This 70 year old female has been suffering with a large  hiatal hernia for a very long time.  She is becoming increasingly  symptomatic from it.  She now presents for the above procedures.   TECHNIQUE:  She is seen in the holding area and brought to the operating  and placed supine on the operating table.  General anesthetic was  administered.  Abdominal wall sterilely prepped and draped and Foley  catheter inserted.  An epigastric incision was made through the skin and  subcutaneous tissue.  The fascia was then divided entering the  peritoneal cavity.  She has very thin, attenuated fascia.  A pursestring  suture of 0-0 Vicryl placed around the fascial edges.  A Hasson trocar  was introduced to the peritoneal cavity and pneumoperitoneum created by  insufflation of CO2 gas.   Next, a laparoscope was introduced.  I then placed an 11 mm trocar in  the right upper quadrant and one just lateral to the midline on the  right and also two in the left upper quadrant region.  A 5 mm trocar was  placed in the subxiphoid region and a self-retaining Nathanson's liver  retractor was inserted and the left lobe of liver  was held up.  Left  lobe of the liver was very floppy.  Also of note was right around the  hiatus there was a hepatic hemangioma.   I began reducing the stomach from the hernia and a least 50 to 60% of  stomach was up into the chest.  I divided the thin gastrohepatic  ligament with a harmonic scalpel heading toward the right crus.  A large  amount of sac was adherent to the right crus made of a lot of fibrofatty  tissue which was quite vascular.  I carefully used the harmonic scalpel  to dissect this free and removed part of it with some small samples.  I  then did dissected some of the sac free anteriorly using the harmonic  scalpel and identified the anterior aspect of the left crus.  There is a  lot of sac between the stomach and the left crus.  I used the harmonic  scalpel to divide the sac and remove it in small sample type pieces.  The stomach appeared to be fairly flaccid and atonic in  the part that I  was reducing.   Next, I identified the greater curvature and made sure it was in proper  orientation.  I then divided short gastric vessels off the fundus of  stomach up to the angle of His region.  I then identified posterior sac  attachments to the stomach and used harmonic scalpel to divide these and  removed them in small sample sizes as well.  The sac was fairly vascular  and we had of some intermittent bleeding from this which was controlled  with harmonic scalpel.  I then approached the right crus and during  trying to mobilize some of the right crus, I noted hemangioma and it  started bleeding some.  I controlled this with cautery and Surgicel.  Also noted was there was a small abrasion on the anterior aspect of  liver from one of the instruments.  The bleeding was controlled with  electrocautery.  However, during the case I noted a very small leakage  of bile from this area.  This stopped with cautery and a piece of  Surgicel.  There was a very superficial abrasion to the  surface of the  liver.   Following this I was then able to create a retroesophageal window.  I  saw more sac attachments between the mediastinum and the stomach and I  divided these with a harmonic scalpel and remove them.  I then had all  of the stomach reduced and no further sac attachments.   Following this I then repaired the hiatus.  I used a size 0-0  nonabsorbable pledgeted sutures and put five sutures in which resulted  in a good closure.   Upper endoscopy was performed visualizing esophagus and no perforation  was noted.  She had diffuse gastritis.  I then evacuated all the air and  removed the endoscope.   Following this, a size 56 lighted bougie was then passed into the  esophagus and stomach under laparoscopic vision.  I performed a 360  degree fundoplication passing the fundus posteriorly at the  gastroesophageal junction.  This was done using three stitches, the  proximal two of which were clipped to be able to identify them on  further x-ray needed.  The bougie was removed intact and the wrap was  loose and under no tension.  I then irrigated out the area and evacuated  fluid.  I examined the spleen and liver.  There was no further bleeding.  No further bile leak noted.  However because of the bleeding from the  hemangioma and some of bile leakage, I decided to go ahead and leave a  drain in the right side area and thus a 19 Blake drain was inserted  through the lateral 11 mm trocar in the right side, placed appropriately  and then anchored to the skin with 3-0 nylon suture.   Following this I released some of the pneumoperitoneum.  I chose a spot  in the left upper quadrant and made incision through the skin,  subcutaneous tissue and fascia.  Using a Babcock, I grabbed part of the  body of the stomach and brought it up through the small wound.  I then  placed a pursestring suture of 2-0 silk around the stomach.  I then  performed a four-quadrant gastropexy using  interrupted 2-0 silk to the  posterior abdominal wall.  A gastrotomy was made, a 24-French Foley with  5 mL balloon was then inserted through the gastrotomy to the stomach.  The  pursestring suture was tightened down, the balloon insufflated.  I  then tied down the gastropexy sutures as well.   I then pulled up the gastrostomy tube until the balloon was snug against  the pursestring suture.  I subsequently closed the small fascial defect  around the gastrostomy with interrupted 0-0 Vicryl sutures.  I then  anchored to the gastrostomy to the skin partially closing the wound also  with two 3-0 nylon sutures.   Following this I reinsufflated the abdomen and hemostasis was adequate.  Gastrostomy looked good with good gastropexy.  I then released the CO2  gas and removed the remaining trocars.   The epigastric fascial defect was closed by tightening up and tying down  the pursestring suture.  The remaining skin incisions were closed with  staples followed by a sterile dressings.   She tolerated the procedure well without any apparent complications and  was taken to recovery room in satisfactory condition.      Adolph Pollack, M.D.  Electronically Signed     TJR/MEDQ  D:  06/22/2007  T:  06/23/2007  Job:  [id number]   cc:   Thora Lance, M.D.  Fax: [phone]   Danise Edge, M.D.  Fax: [phone]

## 2010-09-16 NOTE — Discharge Summary (Signed)
Annette Gross, Annette Gross                 ACCOUNT NO.:  [id number]   MEDICAL RECORD NO.:  [id number]          PATIENT TYPE:  INP   LOCATION:  1335                         FACILITY:  [facility]   PHYSICIAN:  Thora Lance, M.D.  DATE OF BIRTH:  March 16, 1941   DATE OF ADMISSION:  07/16/2007  DATE OF DISCHARGE:  07/26/2007                               DISCHARGE SUMMARY   REASON FOR ADMISSION:  A 70 year old white female who presented  complaining of rectal pain of 24 hours duration.  She had severe pain on  trying to pass a bowel movement.  She had no bowel movement for the last  7 days and had taken laxatives and a suppository the night before  and  then began having pain in the rectum, abdominal cramps and pain with  defecation.  She also had burning pain on urination but no fever, chills  or other symptoms.  She had been treated for a UTI by Dr. Abbey Chatters 1  week prior with Cipro.  She had finished a course of therapy but had not  gotten better.   SIGNIFICANT FINDINGS:  Blood pressure 147/88, heart rate 117,  respirations 18, temperature 98.2.  ABDOMEN:  Nondistended.  Mild tenderness in the left lower quadrant and  over the suprapubic area.  No organomegaly.  A GJ tube was in place.   LABORATORY:  WBC 14.7, hemoglobin 13.5, platelet count 553, sodium 141,  potassium 3.7, chloride 98, bicarbonate 32, glucose 166, BUN 33,  creatinine 0.8, calcium 10, total protein 7.5, albumin 3.7, AST 34, ALT  29, alkaline phosphatase 113, total bilirubin 0.7.  UA showed too  numerous to count WBC, many bacteria.  CT scan of the pelvis in the ER  did not show any acute abdominal findings.  Chest x-ray was no acute  disease.   HOSPITAL COURSE:  1. UTI.  The patient was started on Rocephin for her UTI.  Her urine      culture which had been done as an outpatient proved to grow an E-      coli which was resistant to ciprofloxacin.  The patient was treated      with 1 week of Rocephin and had a documented  clearing of her urine      culture.  Her Foley was discontinued 4 days prior to discharge,      although had to be briefly replaced because of some bladder spasm      and difficulty urinating.  It was then rediscontinued 1 day prior      to discharge and that she had no trouble urinating in the last 24      hours.  2. Constipation.  The patient had significant constipation prior to      being admitted.  She was nauseated.  She was treated with MiraLAX,      Senokot, and Colace and eventually began having bowel movements and      then diarrhea.  She was also treated briefly with IV Reglan.      Because of the diarrhea the patient's laxatives were eventually  stopped.  C.  Difficile antigen was negative.  The patient's      diarrhea resolved and at discharge she had had a small bowel      movement 1 day prior to discharge.  3. Hemorrhoids.  The patient had a thrombosed hemorrhoid.  She was      seen by Dr. Doristine Counter of surgical service.  She was treated with      ProctoFoam HC and sitz baths.  She continued to have rectal pain      and was taken to the OR on July 19, 2007 and had a      hemorrhoidectomy.  Afterwards she was treated with sitz baths and      local care.  Her hemorrhoid pain gradually improved but was still      having some at discharge and will be treated with lidocaine jelly      p.r.n.  4. Tube feeding.  The patient had had a hiatal hernia repair several      weeks prior to admission.  After the surgery she had severe nausea      and was unable to tolerate a diet so a G J tube had been placed by      Dr. Abbey Chatters.  During the hospitalization she was fed through the      jejunal tube.  She did have some nausea initially but this did      improve by discharge.  She was seen by Dr. Laural Benes her      gastroenterologist who had noted that she had a preop and postop      nuclear medicine scan showing 80% gastric retention at 2 hours.      Dr. Henriette Combs actions were to stop  her laxatives because they were      causing diarrhea.  The patient was started on IV Reglan for nausea      relief and gastric stimulation.  She was placed on a regular diet      which she tolerated well.  On the day prior to discharge her      jejunal tube feedings were discontinued.  On two occasions her J      tube was proved to be blocked and had to be repositioned and      flushed by interventional radiology.  5. Hypertension.  The patient's Diovan HCT was held during the      admission.  The patient's blood pressure remained normal and this      will 9 be held and restarted likely as an outpatient.  6. Diabetes mellitus type 2.  The patient's blood sugars generally      remained under pretty good control.  At one point they got into the      high 100's and her J-tube feedings were switched to Glucerna with      good control in the low 100s thereafter.  She is diet controlled      diabetic.   DISCHARGE DIAGNOSES:  1. Urinary tract infection.  2. Constipation.  3. Thrombosed external hemorrhoid.  4. Diabetic gastroparesis.  5. Diabetes mellitus.  6. Hypertension.  7. Gastroesophageal reflux disease with a stable large hiatal hernia      surgically repaired. 8.  Hyperlipidemia.  8. Degenerative disc disease of the back.  9. Asthma.  10.Depression.  11.Iron-deficiency anemia.   PROCEDURES:  One column hemorrhoidectomy.   DISCHARGE MEDICATIONS:  1. Protonix 40 mg one p.o. daily.  2. Zyrtec 10 mg one p.o.  daily.  3. MiraLAX 17 grams in water daily b.i.d. p.r.n. for constipation.  4. Reglan 10 mg 30 minutes before each meal.  5. Colace 100 mg twice a day.  6. Senokot 2-4 each p.m. as needed for constipation.  7. Lidocaine 2% jelly to anal area twice a day as needed.  8. Diovan HCT,  diclofenac and aspirin held at discharge.   DISPOSITION:  Discharged to home.   FOLLOW-UP:  With Dr. Valentina Lucks in 1 week.  With Dr. Abbey Chatters as  instructed by general surgery.   DIET:   Regular diet.   ACTIVITY:  As tolerated.   The GJ tube care per general surgery.  Local care for hemorrhoids per  general surgery.           ______________________________  Thora Lance, M.D.     JJG/MEDQ  D:  07/26/2007  T:  07/26/2007  Job:  [id number]   cc:   Danise Edge, M.D.  Fax: [phone]   Adolph Pollack, M.D.  [address]  [place]  [place] [address]   Clovis Pu. Cornett, M.D.  [address] [place] [address]  [place] [address]

## 2010-09-16 NOTE — Consult Note (Signed)
[name], Annette Gross                 ACCOUNT NO.:  [id number]   MEDICAL RECORD NO.:  [id number]          PATIENT TYPE:  INP   LOCATION:  1335                         FACILITY:  [facility]   PHYSICIAN:  Clovis Pu. Cornett, M.D.DATE OF BIRTH:  1941-01-14   DATE OF CONSULTATION:  07/17/2007  DATE OF DISCHARGE:                                 CONSULTATION   PHYSICIAN REQUESTING CONSULTATION:  Theressa Millard, M.D.   REASON FOR CONSULTATION:  Status post laparoscopic hiatal hernia repair  and G tube placement by Dr. Avel Peace last month with rectal pain,  constipation and abdominal pain, readmitted to the medical service  overnight.   HISTORY OF PRESENT ILLNESS:  The patient is a 70 year old female with  multiple medical problems.  Last month she underwent a laparoscopic  hiatal hernia repair by Dr. Avel Peace with placement of a  gastrostomy tube due to poor gastric emptying for a large hiatal hernia.  Her course was complicated.  She was discharged and readmitted for  failure to thrive.  She has problems with chronic constipation,  hemorrhoids and actually poor gastric emptying.  The G tube was  exchanged on March 4 for a feeding gastrojejunostomy tube to try to feed  her distal to her stomach.  She states she has had no bowel movement for  7 days.  She presented to the emergency department last night with  severe abdominal pain.  CT was obtained which showed no acute process.  Her feeding tube was in an appropriate position, her stomach with the  balloon inflated, contrast went past this with contrast in her small  bowel at the tip of her feeding tube in the jejunum.  No evidence of  perforation, free air abscess, or any complicating problem from her  surgery it looked like.  She did not have any significant small-bowel  dilation her or actually colonic dilation.  There is stool in her colon,  but this did not seem extensive to me.  In any event, she has been  getting tube feeds at  home.  She has had significant rectal  pain and  what appeared to be a urinary tract infection and was admitted to the  medical service.  I was asked to see her to help sort out some of these  issues to see if there is anything else to offer from the standpoint of  her complaint.  Currently, she denies any significant abdominal pain.  No evidence of nausea, vomiting, fever or chills.  She is complaining of  rectal pain.  This is severe in nature.  She has had a longstanding  history of hemorrhoids.  She has tried some Preparation H but nothing  else.   PAST MEDICAL HISTORY:  1. Hypertension.  2. Type 2 diabetes mellitus.  3. Elevated cholesterol.  4. Degenerative joint disease.  5. Obesity.  6. Aspirin.  7. Factor V deficiency.  8 . Depression.  1. History of internal hemorrhoids.  2. Iron deficiency anemia.  3. Hepatic angioma.   PAST SURGICAL HISTORY:  1. Laparoscopic hiatal hernia repair with NG tube placement  by Dr.      Abbey Chatters in February 2009.  2. Ventral hernia repair as mentioned  3. Hemorrhoidectomy.  4. Tonsillectomy.  5. Cholecystectomy.  6. Oophorectomy and bilateral salpingo-oophorectomy.   FAMILY HISTORY:  Significant for peripheral vascular disease.   ALLERGIES:  X-RAY DYE, VICODIN, OXYCODONE, and SULFA.   MEDICATIONS:  Include Diovan, hydrochlorothiazide, Zyrtec, diclofenac,  Protonix, Tylenol, multivitamin, vitamin C.   FAMILY HISTORY:  Noncontributory.   REVIEW OF SYSTEMS:  As stated above, otherwise 15 points negative.   PHYSICAL EXAMINATION:  VITAL SIGNS:  Temperature is 97, blood pressure  149/85, respiratory rate 22.  GENERAL:  White female in no apparent distress.  HEENT:  Extraocular movements are intact.  Pupils are equal, round, and  reactive to light.  Oropharynx clear.  NECK:  Supple, nontender.  No mass.  CHEST:  Clear to auscultation.  Chest wall motion normal.  CARDIOVASCULAR:  Regular rate and rhythm without rub, murmur or  gallop.  EXTREMITIES:  Warm, well perfused.  ABDOMEN:  Soft, nontender.  No rebound or guarding. Incision is well  healed.  Gastrostomy tube site clean, dry and intact.  EXTREMITIES:  No clubbing, cyanosis nor edema.  Muscle tone is  decreased.  RECTAL:  She does have significant internal and external hemorrhoid  disease. These are Grade 3.  These do reduce even though they are  swollen and engorged and inflamed appearing today.  I do not see any  evidence of pus or abscess at this point in time.   DIAGNOSTIC STUDIES:  I reviewed her abdominal and pelvic CT.  No acute  changes were noted.  Post surgical changes appear intact with no  problems from that standpoint.  Feeding tube is in the jejunum and  appears to be in good position without perforation.  Gastrostomy tube  was inflated without problem.   Laboratory studies show white count 9000 this morning.  Hemoglobin 12.  Platelet count 432,000.  Electrolytes within normal limits.  SGOT 330,  SGPT 312, normal bilirubin and a slightly elevated alkaline phosphatase  to 165.  UA shows signs of a urinary tract infection.   IMPRESSION:  1. Status post laparoscopic hiatal hernia repair with gastrostomy tube      in a 70 year old female with multiple medical problems to include      poor gastric emptying, chronic constipation, poor GI motility,      failure to thrive, now with 7-day history of no bowel movement,      elevated transaminase, poor feeding tolerance, and hemorrhoids.   PLAN:  At this point in time, will start Sitz baths, ProctoFoam for  local treatment of her hemorrhoids while she is in the hospital.  She  does have significant grade 3 disease which appears quite swollen and  inflamed today and would benefit from hemorrhoidectomy down road once  her medical issues are better controlled.  I do not see any evidence of  infection or abscess at this point.   As for her constipation, she needs to be placed on some laxatives and   may even benefit from a trial of Reglan to see if helping her stomach  empty helps some of her poor feeding.  She would benefit from a GI  consultation, and Dr. Laural Benes has seen her in the past for some of these  issues.   As for transaminases, I see no evidence of ductal dilatation.  She may  require gastroenterology's help on that issue as well.   I do not  see anything related to her surgery that is complicating her  care, and these appear to be mostly medical issues; but hopefully if we  can manage better, that will help her rectal pain and help her feel  better and be able to be discharged home.  I see no complicating factors  from her surgery at this point and have nothing further to add except to  treat the above issues.  Dr. Abbey Chatters is out of town.  I will follow  her in his absence.  Thank you for this consultation.      Thomas A. Cornett, M.D.  Electronically Signed     TAC/MEDQ  D:  07/17/2007  T:  07/17/2007  Job:  [id number]   cc:   Thora Lance, M.D.  Fax: [phone]   Adolph Pollack, M.D.  [address]  [place]  [place] [address]   Danise Edge, M.D.  Fax: [phone]

## 2010-09-16 NOTE — H&P (Signed)
Annette Gross, Annette Gross NO.:  [id number]   MEDICAL RECORD NO.:  [id number]          PATIENT TYPE:  INP   LOCATION:  5157                         FACILITY:  [facility]   PHYSICIAN:  Adolph Pollack, M.D.DATE OF BIRTH:  1941-04-30   DATE OF ADMISSION:  07/01/2007  DATE OF DISCHARGE:                              HISTORY & PHYSICAL   REASON:  Gastroparesis and failure to thrive.   HISTORY OF PRESENT ILLNESS:  Annette Gross is a 70 year old female who had  a longstanding (20 to 30 years) hiatal hernia, becoming increasingly  symptomatic with dysphasia, reflux.  At least 58% of her stomach was in  her chest/mediastinum.  She had known preoperative delayed gastric  emptying but had no symptoms of this.  She subsequently underwent a  laparoscopic repair of the large hiatal hernia with Nissen  fundoplication and a gastrostomy, June 22, 2007.  Postoperatively,  she was placed on Prokinetics, actually did well in and out of the  hospital and was at home when she started having increasing drainage  around her G-tube the past 2 days.  She states it has begun to look  somewhat like the food she is eating.  She is having BMs as well.  She  comes in today for evaluation to the office.  There have been no fever  or chills.   PAST MEDICAL HISTORY:  1. Large hiatal hernia with gastroesophageal reflux.  2. Delayed gastric emptying.  3. Type 2 diabetes.  4. Hypertension.  5. Hypercholesteremia.  6. Degenerative disease of the back with spinal stenosis.  7. Asthma and bronchitis.  8. History of factor 5 deficiency.  9. Depression.  10.Mononucleosis.  11.Iron deficiency anemia.  12.Hepatic hemangioma.   PREVIOUS OPERATIONS:  1. Tonsillectomy.  2. Hemorrhoidectomy.  3. Cholecystectomy.  4. Left oophorectomy.  5. Laparoscopic umbilical hernia repair.  6. Right shoulder surgery.  7. Removal of right ganglion cyst.  8. Right salpingo-oophorectomy.   ALLERGIES:  INCLUDE X-RAY  DIET, AS WELL AS SULFA, VICODIN, OXYCODONE,  AND CODEINE.   SOCIAL HISTORY:  She is married here with her husband.  Works as a  Runner, broadcasting/film/video for [employer].  No tobacco or alcohol  use.   PHYSICAL EXAM:  GENERAL:  Slightly weak female but very pleasant and  cooperative.  She is afebrile.  HEENT:  EYES:  Extraocular systemic no icterus.  RESPIRATORY:  Breath sounds equal and clear, respirations unlabored.  CARDIOVASCULAR:  Demonstrates a regular rate and regular rhythm.  I hear  no murmur.  ABDOMEN:  Soft with well-healed incisions.  There is a mild amount of  tenderness around the G-tube site with little redness.  There is some  gastric drainage coming out around the G-tube side.  The G-tube was  flushed and aspirated and is patent, and it returns gastric contents.  SKIN:  No jaundice.   IMPRESSION:  Postoperative gastroparesis,  in somebody who has known,  chronic delayed gastric emptying.  Also looks to have failure to thrive  at this time.   PLAN:  Will admit to the hospital and  re-hydrate her.  Will start her on  TNA with plans to cycle it.  Will place here G tube to gravity and give  G-tube wound care.  Will plan on repeating a gastric emptying scan in 3  days.  This been discussed with her and her husband at length.  I told  her there is a good chance she will need to be on prolonged parenteral  nutrition and even potentially in all some form of tube feeding to allow  the stomach to regain it's tone and start working well again.      Adolph Pollack, M.D.  Electronically Signed     TJR/MEDQ  D:  07/01/2007  T:  07/02/2007  Job:  [id number]   cc:   Danise Edge, M.D.  Thora Lance, M.D.

## 2010-09-16 NOTE — Discharge Summary (Signed)
Annette Gross, Annette Gross                 ACCOUNT NO.:  [id number]   MEDICAL RECORD NO.:  [id number]          PATIENT TYPE:  INP   LOCATION:  1528                         FACILITY:  [facility]   PHYSICIAN:  Adolph Pollack, M.D.DATE OF BIRTH:  [birth year]   DATE OF ADMISSION:  06/22/2007  DATE OF DISCHARGE:  06/27/2007                               DISCHARGE SUMMARY   FINAL DISCHARGE DIAGNOSIS:  Large hiatal hernia.   SECONDARY DIAGNOSES:  1. Chronic gastroparesis.  2. Hypertension.  3. Type 2 diabetes.  4. Degenerative joint disease.  5. Hypercholesterolemia.  6. Asthma.  7. Bronchitis.  8. History of  Factor V deficiency  9. Depression.  10.Iron-deficiency anemia.   PROCEDURE:  Laparoscopic hiatal hernia repair, Nissen fundoplication,  gastrostomy, upper endoscopy June 22, 2007.   INDICATION:  Annette Gross is a 70 year old female who has had a very  longstanding hiatal hernia.  She has intermittent dysphagia and reflux  that is progressively worsening.  She has had a complete evaluation.  An  upper GI showed at least 50% of the stomach into the mediastinum.  Manometry was unable to be done as the catheter was not able to be  positioned properly.  She has also known delayed gastric emptying.  Because of her progressive symptoms, she was admitted for elective  repair.   HOSPITAL COURSE:  She underwent the above procedure.  Postoperatively  she had some problems with urinary retention.  On postop day #1 upper GI  demonstrated no evidence of leak from the esophagus or stomach.  I  started her on some Reglan and erythromycin for delayed gastric  emptying.  A G tube was to gravity.  She had some nausea as well so I  was holding her diet.  The nausea passed, and I was able to start her on  a diet.  Foley was removed on the fourth postoperative day, and she  tolerated this fairly well.  She began to tolerate a full liquid diet.  I was able to clamp the G tube.  She had a drain left in,  which was  removed, and by her fifth postoperative day her staples were removed,  drain was removed, and she was able to be discharged with instructions.   DISPOSITION:  Discharged home June 27, 2007.  She was given specific  instructions on a discharge instruction sheet and some Darvocet for pain  as well as Reglan and erythromycin.  She was told that if she felt  nauseated and bloated she could go ahead and open up the G tube, which  is clamped, and drain it.  Plan to see her back in the office in about a  week.      Adolph Pollack, M.D.  Electronically Signed     TJR/MEDQ  D:  07/12/2007  T:  07/13/2007  Job:  [id number]   cc:   Danise Edge, M.D.  Fax: [phone]   Thora Lance, M.D.  Fax: [phone]

## 2010-09-19 NOTE — Op Note (Signed)
NAME:  TALICIA, SUI                           ACCOUNT NO.:  [id number]   MEDICAL RECORD NO.:  [id number]                   PATIENT TYPE:  AMB   LOCATION:  ENDO                                 FACILITY:  [facility]   PHYSICIAN:  Danise Edge, M.D.                DATE OF BIRTH:  [birth year]   DATE OF PROCEDURE:  04/10/2002  DATE OF DISCHARGE:                                 OPERATIVE REPORT   PROCEDURE:  Esophagogastroduodenoscopy, small bowel biopsy, and colonoscopy.   INDICATIONS:  The patient is a 70 year old female born 04/18/1941.  The patient  has unexplained iron-deficiency anemia based on a low serum ferritin, low  iron saturation, hemoglobin 9.5 g, with microcytic indices.   In 1999 the patient underwent a flexible proctosigmoidoscopy followed by air  contrast barium enema, which revealed left colonic diverticulosis.  An upper  GI small bowel follow-through x-ray series revealed a large hiatal hernia.  MRI of the liver revealed two hemangiomas.   A March 2003 CT scan of the abdomen and pelvis revealed a large hiatal  hernia, umbilical hernia, right ovarian cyst, and liver hemangiomas.   ENDOSCOPIST:  Danise Edge, M.D.   PREMEDICATION:  The patient received a total of 10 mg Versed and 80 mg of  Demerol for both procedures.   PROCEDURE:  Esophagogastroduodenoscopy with small bowel biopsies.   DESCRIPTION OF PROCEDURE:  After obtaining informed consent, the patient was  placed in the left lateral decubitus position.  I administered intravenous  Demerol and intravenous Versed to achieve conscious sedation for the  procedure.  The patient's blood pressure, oxygen saturation, and cardiac  rhythm were monitored throughout the procedure and documented in the medical  record.   The Olympus pediatric colonoscope was passed through the posterior pharynx  into the proximal esophagus without difficulty.  The hypopharynx and larynx  appeared normal.  I did not visualize the vocal  cords.   Esophagoscopy:  The proximal, mid-, and lower segments of the esophagus  appear normal.   Gastroscopy:  The patient has a large hiatal hernia.  Retroflexed view of  the gastric cardia and fundus was normal.  The gastric body, antrum, and  pylorus appeared normal.   Duodenoscopy:  The duodenal bulb, mid-duodenum, distal duodenum, and  proximal jejunum appeared normal.  Six biopsies were taken from the second-  third portions of the duodenum to rule out celiac sprue.   ASSESSMENT:  Large hiatal hernia, otherwise normal  esophagogastroduodenoscopy.  Small bowel biopsies are pending.   PROCEDURE:  Proctocolonoscopy with rectal polypectomy.   DESCRIPTION OF PROCEDURE:  Anal inspection was normal.  Digital rectal exam  was normal.  The Olympus pediatric video colonoscope was introduced into the  rectum and advanced to the cecum.  Colonic preparation for the exam today  was excellent.   Rectum:  From the distal rectum a 2 mm sessile polyp was removed with the  electrocautery snare and submitted for pathologic interpretation.   Sigmoid colon and descending colon reveal left colonic diverticulosis.   Splenic flexure normal.   Transverse colon normal.   Hepatic flexure normal.   Ascending colon normal.   Cecum and ileocecal valve normal.   ASSESSMENT:  1. Left colonic diverticulosis.  2. A 2 mm sessile polyp was removed from the distal rectum.                                               Danise Edge, M.D.    MJ/MEDQ  D:  04/10/2002  T:  04/10/2002  Job:  [id number]   cc:   Thora Lance, M.D.  [address]  [place]  [place] [address]  Fax: [phone]

## 2010-09-19 NOTE — Discharge Summary (Signed)
NAMEEMILIE, CARP                 ACCOUNT NO.:  [id number]   MEDICAL RECORD NO.:  [id number]          PATIENT TYPE:  INP   LOCATION:  5157                         FACILITY:  [facility]   PHYSICIAN:  Adolph Pollack, M.D.DATE OF BIRTH:  Sep 04, 1940   DATE OF ADMISSION:  07/01/2007  DATE OF DISCHARGE:  07/09/2007                               DISCHARGE SUMMARY   PRINCIPAL DISCHARGE DIAGNOSIS:  Postoperative gastroparesis.   SECONDARY DIAGNOSES:  1. Chronic delayed gastric emptying.  2. Type 2 diabetes.  3. Hypertension.  4. Hypercholesterolemia.  5. 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  6. Asthma.  7. Bronchitis.  8. Factor V deficiency.  9. Depression.  10.Mononucleosis.  11.Anemia.  12.Hepatic hemangioma.  13.Failure to thrive.  14.Protein-calorie malnutrition.   PROCEDURES:  Change of gastrostomy tube to gastrostomy/jejunostomy tube.   REASON FOR ADMISSION:  This is a 70 year old female who underwent a  laparoscopic repair of a chronic large hiatal hernia with Nissen  fundoplication on June 22, 2007 and did well until, she came to the  office with increasing drainage around her G tube and failure to thrive.  Because of this and her inability to hold to gain adequate nutrition,  she was admitted to the hospital.   HOSPITAL COURSE:  She is admitted to the hospital PICC line started.  She is on TNA, but she immediately got severe arthralgias and headaches  from that TNA.  Gastric emptying scan did demonstrated some delayed  gastric emptying as well.  Subsequently, the TNA was modified to take  the Intralipids out of the TNA and she tolerated this much better.  She  had a wound care consult and they were able to help control leakage  around the G tube.  She had interventional radiology do Gastrografin G-  tube study, which showed that the G tube was in adequate condition.  We  have tried her on Reglan and erythromycin, but this showed no  improvement and gastric emptying as she had 78% retention after 2 hours  which is of improvement.  On July 06, 2007, she had a gastrostomy tube  exchanged for gastrostomy/jejunostomy tube and a leakage around the G  tube resolved.  She was maintained on T&A and I had begun her on some  essential Intralipid replacements and then started J-tube feedings.  Eventually, we were able to get around a 16-hour J-tube feedings, which  she tolerated well.  By July 09, 2007, I was able to have her discharge  with home health nursing care as well as checking her blood glucoses at  home.   DISPOSITION:  Discharged to home with home health nursing care on July 09, 2007 on 16-hour a day jejunostomy feedings as well as free water  administration.  She was given specific discharge instructions.  She is  going to monitor her blood sugars and report these results to my office.  I will have her come back and see me in 6 days.      Adolph Pollack, M.D.  Electronically Signed     TJR/MEDQ  D:  08/16/2007  T:  08/17/2007  Job:  [id number]   cc:   Danise Edge, M.D.  Thora Lance, M.D.

## 2010-09-19 NOTE — Op Note (Signed)
NAME:  Annette Gross, Annette Gross                           ACCOUNT NO.:  [id number]   MEDICAL RECORD NO.:  [id number]                   PATIENT TYPE:  AMB   LOCATION:  DSC                                  FACILITY:  [facility]   PHYSICIAN:  Lubertha Basque. Jerl Santos, M.D.             DATE OF BIRTH:  Feb 05, 1941   DATE OF PROCEDURE:  05/08/2003  DATE OF DISCHARGE:                                 OPERATIVE REPORT   PREOPERATIVE DIAGNOSIS:  Right foot cyst.   POSTOPERATIVE DIAGNOSIS:  Right foot cyst.   PROCEDURE:  Excision of cyst, right foot.   ANESTHESIA:  Ankle block, MAC.   ATTENDING SURGEON:  Lubertha Basque. Jerl Santos, M.D.   ASSISTANT:  Lindwood Qua, P.A.   INDICATIONS FOR PROCEDURE:  The patient is a 70 year old woman with a long  history of a right foot cyst.  This has limited her shoe wear and caused  some discomfort.  She would like to have it removed.  She is offered  excision under ankle block anesthetic.  Informed operative consent was  obtained after a discussion of the possible complications of reaction to  anesthesia, infection, neurovascular injury and a 10% chance of recurrence.   DESCRIPTION OF PROCEDURE:  The patient was taken to the operating suite  where ankle block was applied along with some sedation.  She was positioned  supine and prepped and draped in a normal sterile fashion.  After the  administration of preoperative IV antibiotics, the right leg was elevated,  exsanguinated, and a tourniquet inflated about the calf.  A longitudinal  incision was made over her dorsolateral cyst with dissection down to the  cyst and an overlying vein.  The vein was ligated.  The cyst was then  removed.  This was a gelatinous-filled cyst about 1 cm in diameter with a  stalk which I also removed.  The tourniquet was deflated and the foot became  pink and warm immediately.  A small amount of bleeding was easily controlled  with Bovie cautery.  The wound was irrigated followed by re-approximation of  deep tissues with 2-0 undyed Vicryl and the skin with nylon.  Adaptic was  placed over the wound followed by dry gauze and a loose Ace wrap.  Estimated  blood loss and intraoperative fluids can be obtained from the anesthesia  records as can accurate tourniquet time.   DISPOSITION:  The patient was taken to the recovery room in stable  condition.  The plans were for her to go home the same day and to follow up  in the office in less than a week.  I will contact her by phone tonight.                                               Theron Arista  Janann Colonel, M.D.    PGD/MEDQ  D:  05/08/2003  T:  05/08/2003  Job:  [id number]

## 2010-09-19 NOTE — Op Note (Signed)
[place]  Patient:    Annette Gross, Annette Gross                        MRN: [id number] Proc. Date: 08/31/00 Adm. Date:  [id number] Attending:  Marcene Corning                           Operative Report  PREOPERATIVE DIAGNOSES: 1. Right shoulder rotator cuff tear. 2. Right shoulder impingement.  POSTOPERATIVE DIAGNOSES: 1. Right shoulder rotator cuff tear. 2. Right shoulder impingement.  PROCEDURES: 1. Right shoulder arthroscopic rotator cuff repair. 2. Right shoulder arthroscopic acromioplasty.  ANESTHESIA:  General and block.  SURGEON:  Lubertha Basque. Jerl Santos, M.D.  ASSISTANT:  Prince Rome, P.A.  INDICATION FOR PROCEDURE:  The patient is a 70 year old woman with a long history of right-dominant shoulder pain.  This has persisted despite oral anti-inflammatories and activity restriction.  She did respond in a transient way to a subacromial injection.  She underwent a preoperative MRI scan, which showed a small, retracted complete rotator cuff tear involving the supraspinatus and also some significant cuff degeneration and impingement. Planned procedure at this point is for arthroscopy.  The procedure was discussed with the patient, and informed operative consent was obtained after discussion of the possible complications of, reaction to anesthesia, and infection.  DESCRIPTION OF PROCEDURE:  The patient was taken to the operating suite, where a general anesthetic was applied without difficulty.  She was given a regional block in the preanesthesia area.  She was positioned in beach chair position and prepped and draped in normal sterile fashion.  After the administration of preoperative IV antibiotics, an arthroscopy of the right shoulder was performed through a total of three portals.  The glenohumeral joint showed no degenerative change, and the biceps tendon and all labral structures were well-attached.  The rotator cuff appeared at least  partially torn on undersurface inspection.  In the subacromial space, she had a prominent subacromial spur, which was addressed with a decompression back to a flat surface.  This was done with the bur in the lateral position, followed by transfer of the bur to the posterior position.  The [facility] joint was not addressed, as she had no pain in that area and no spur was seen.  The rotator cuff was thoroughly examined.  She did have a small tear, which measured about a centimeter across.  The tear was directly off the greater tuberosity.  This degenerative area was removed, and a bleeding bed of bone was created below with an arthroscopic bur.  A single bio-corkscrew anchor was placed with two Ethibond sutures emanating.  These were then passed through the rotator cuff free edge and used to tie this down in mattress fashion to the bleeding bed of bone.  We achieved a good, tight repair.  The shoulder was thoroughly irrigated at the end of the case, followed by placement of Marcaine with epinephrine and morphine.  Simple sutures of nylon were used to reapproximate the portals, followed by Adaptic and a dry gauze dressing with tape. Estimated blood loss and intraoperative fluids can be obtained from anesthesia records.  DISPOSITION:  The patient was extubated in the operating room and taken to the recovery room in stable condition.  Plans were for her to go home the same day and to follow up in the office in less than a week.  I will contact her by phone  tonight. DD:  08/31/00 TD:  08/31/00 Job: [id number]

## 2010-09-19 NOTE — Consult Note (Signed)
[place]  Patient:    Annette Gross, Annette Gross                          MRN: [id number] Proc. Date: 10/28/99 Attending:  Barry Dienes. Eloise Harman, M.D. Dictator:   Barry Dienes. Eloise Harman, M.D.                          Consultation Report  REQUESTING PHYSICIAN:  Dr. Kirby Funk.  REASON FOR CONSULTATION:  Left foot pain in a patient with diabetes.  HISTORY OF PRESENT ILLNESS:  The patient is a 70 year old white female with diabetes mellitus type 2, diagnosed in February 2001.  She is on diet and exercise to control her diabetes, and her last hemoglobin A1C was 7.5% on July 11, 1999.  She complains of pain in the distal lateral left foot dorsum for approximately one month.  The patient is increased with swimming or other activities, has a burning sensation to it, and is somewhat improved with rest.  PAST MEDICAL HISTORY: 1. Hypertension. 2. Asthma. 3. Degenerative disc disease in the lumbar spine. 4. Gastroesophageal reflux disease.  PAST SURGICAL HISTORY: 1. Remote tonsillectomy in 1991. 2. Cholecystectomy. 3. Hernia repair in 2000.  ALLERGIES:  SULFA, MORPHINE, CODEINE, SHRIMP.  MEDICATIONS: 1. AeroBid 2 puffs p.o. b.i.d. 2. Albuterol 2 puffs p.o. q.4h. p.r.n. 3. Zurtec 10 mg p.o. q.d. 4. Flonase 2 sprays each nostril q.d. 5. Serzone 150 mg p.o. b.i.d. 6. Prevacid 30 mg p.o. q.d. 7. Diovan 80 mg p.o. q.d. 8. Ocuvite one tablet p.o. q.d. 9. Lipitor 20 mg p.o. q.d.  PHYSICAL EXAMINATION:  The shape of her feet are normal, as are her pedal pulses.  She has normal sensation to testing with 5.07 weight not on filament.  There is mild onychomycosis affecting multiple toenails.  On the left foot between the third and fourth digits there is tenderness to deep palpation that reproduces the pain of her chief complaint.  There is no evidence of heavy callus formation or foot ulceration.  IMPRESSION:  Left foot pain likely due to Mortons neuroma, and less  likely due to diabetic peripheral neuropathy or stress fracture.  RECOMMENDATIONS:  A custom insert will be made for her shoes to decrease the pressure on the Mortons neuroma.  She would rather hold on other options such as corticosteroid local injection or surgical repair.  She will be seen in followup as necessary if her symptoms persist. DD:  10/28/99 TD:  10/28/99 Job: [id number]

## 2010-09-19 NOTE — Op Note (Signed)
NAME:  Annette Gross, Annette Gross                           ACCOUNT NO.:  [id number]   MEDICAL RECORD NO.:  [id number]                   PATIENT TYPE:  INP   LOCATION:  9310                                 FACILITY:  [facility]   PHYSICIAN:  Sherry A. Rosalio Macadamia, M.D.           DATE OF BIRTH:  [birth year]   DATE OF PROCEDURE:  09/18/2003  DATE OF DISCHARGE:                                 OPERATIVE REPORT   PREOPERATIVE DIAGNOSES:  1. Postmenopausal bleeding.  2. Right lower quadrant pain.  3. Enlarged right ovary.   POSTOPERATIVE DIAGNOSES:  1. Postmenopausal bleeding.  2. Right lower quadrant pain.  3. Enlarged right ovary.  4. Right retroperitoneal adnexal cyst.   SURGEONS:  Sherry A. Rosalio Macadamia, M.D., Genia Del, M.D., and Pershing Cox, M.D.   PROCEDURES:  1. Dilatation and curettage, hysteroscopy with resectoscope.  2. Open diagnostic laparoscopy with right salpingo-oophorectomy and removal     of right adnexal cyst.   ANESTHESIA:  General.   INDICATIONS:  This is a 70 year old G6, P4-0-2-4, woman who complained of  postmenopausal bleeding with small blood clots.  The patient stated she  could not tell whether this was from the vagina or from the uterus but felt  that this was probably from the uterus.  The patient had complaints of right  lower quadrant pain, for which she had an abdominal CT scan.  Abdominal CT  revealed a right ovarian cyst approximately 5 cm in size.  Ultrasound was  performed, which showed a normal-sized uterus with normal endometrium and  normal right ovary but because of the persistence of her right lower  quadrant and the abnormality found on CT scan, the patient is brought to the  operating room for [facility] hysteroscopy with resectoscope as well as diagnostic  laparoscopy and removal of right tube and ovary.   FINDINGS:  Normal-sized anteflexed uterus.  Surgically absent left ovary.  Normal appendix.  Normal right tube and ovary.  Right adnexal  retroperitoneal cyst.  Normal endometrium.   PROCEDURE:  The patient was brought into the operating room and given  adequate general anesthesia.  She was placed in a dorsal lithotomy position.  Her abdomen and vagina were washed with Hibiclens, a Foley catheter was  inserted in the bladder.  The patient was draped in a sterile fashion.  A  speculum was placed within the vagina.  The anterior lip of the cervix was  grasped with a single-tooth tenaculum after changing to excessively long  speculum.  Then a paracervical block was administered with 1% Nesacaine.  Cervix was sounded.  Cervix was dilated with Pratt dilators to a #31.  Hysteroscope was introduced into the endometrial cavity.  Pictures were  obtained.  Superficial endometrial resections were taken with a double loop  resectoscope circumferentially.  Adequate hemostasis was present.  The  hysteroscope was removed.  A single-tooth Hulka tenaculum was placed in the  cervix into  the endometrial cavity in anteflexed fashion.  First tenaculum  and speculum were then removed.  Surgeon's gown and gloves were changed.  Subumbilical incision was made and brought down sharply to fascia.  Fascia  was grasped with Kocher clamps.  Fascia was identified.  While identifying  the fascia, there were some small wire corkscrew springs that were removed  through the incision.  Four of these were found and were removed.  The what  was felt to be fascial tissue was incised; however, after evaluating these  coil springs and this tissue, it was felt that this had probably been a mesh  placed for an abdominal hernia at some previous time, which the patient had  not mentioned to the surgeon.  This tissue was opened and the peritoneum was  identified.  There were some adhesions of omentum to the anterior abdominal  wall above the umbilicus, but the peritoneal space could be identified just  below the umbilicus.  A Hasson trocar was able to be placed into  the  peritoneal space and cinched down with a 0 Vicryl suture that had been  placed circumferentially prior to placing the Hasson.  Carbon dioxide was  insufflated.  The pelvic cavity was identified.  Marcaine was used for all  skin incisions prior to any incision, and a suprapubic incision was made  with a suprapubic trocar placed under direct visualization.  Nezhat was used  for irrigation and for pelvic washings.  Pelvic washings were removed.  The  pelvis was inspected with the above findings and a decision was made to try  to attempt to remove the right tube and ovary and retroperitoneal cyst.  The  right fallopian tube was grasped along its cornual region and cauterized x3  and cut in the middle.  The right utero-ovarian ligament was identified.  It  was cauterized x3, then cut in the middle.  A careful dissection was  performed below the ovary with cautery and cutting.  The lateral wall  peritoneum was visualized, small bleeders were cauterized, and the  peritoneum was cut to be able to release the peritoneal cyst.  The cyst was  dissected free with blunt and some sharp dissection.  Any blood vessels  below it were cauterized and then cut using the tripolar cautery.  The  specimen was then continued to be dissected with cautery x2 and cutting  right next to the peritoneal edge to be able to remove the tube, ovary, and  cyst intact.  This was accomplished.  A 5 mm scope was then placed through  the right side port and an EndoCatch bag was placed through the umbilical  port.  The specimen was able to be placed in the EndoCatch bag, and this was  removed through the umbilical incision by removing the Hasson and taking the  EndoCatch bag out through the incision.  The Hasson was then replaced and  cinched down with 0 Vicryl sutures.  The regular laparoscope was reconnected and placed in the umbilical port.  The pelvis was inspected.  The pelvis was  irrigated with Nezhat fluid.  There  was a small amount of bleeding along the  base of the right retroperitoneal dissection.  These were cauterized  superficially with the tripolar.  The ureter was attempted to be identified  but because of the patient's severe obesity and positioning necessary to be  able to see in the operating site, the patient's urine output was very  limited, there was no peristalsis that  could be seen, this area was felt to  be well above any ureter, but only careful superficial cautery was  performed.  A small amount of Surgicel was placed in this retroperitoneal  space.  Adequate hemostasis was felt to be present.  The entire pelvis was  inspected.  During the procedure there were some white patches seen on the  rectosigmoid colon as well as on portions of the small intestine.  The white  patches on the small intestine were felt to be consistent with some  adhesions.  The white lesion on the rectosigmoid could not be identified.  The uterus was inspected.  There was no cautery injury through the uterus  from the resectoscope.  There was no cautery injury from the tripolar as the  procedure had been done; however, this white patch was very unusual in its  location and description.  Therefore, a decision was made to just monitor  the patient carefully.  The pelvis was reinspected.  Carbon dioxide was  allowed to escape, and the surgical site was inspected and no further  significant bleeding was present.  All irrigation was removed.  Carbon  dioxide was removed.  The right trocar was removed under direct  visualization.  The suprapubic trocar was removed after all carbon dioxide  had escaped, and the Hasson sleeve was removed.  The fascia was closed over  a finger to assure no bowel being caught in the fascial stitch.  Using 0  Vicryl, the fascia or mesh was closed from the pursestring stitch.  Some  deep subcutaneous tissue was closed with 0 Vicryl in a mattress-type stitch.  Incision was closed with  4-0 Monocryl in a subcuticular running stitch.  All  three incisions were then closed with Dermabond.  The Hulka tenaculum was  removed from the vagina.  The patient was taken out of the dorsal lithotomy  position.  She was awakened.  She was extubated.  She was removed from the  operating table to a stretcher in stable condition.   COMPLICATIONS:  None.   ESTIMATED BLOOD LOSS:  10 mL.   SORBITOL DIFFERENTIAL:  -50 mL.   SPECIMENS:  1. Endometrial resections.  2. Right tube and ovary and cyst.                                               Cordelia Pen A. Rosalio Macadamia, M.D.    SAD/MEDQ  D:  09/18/2003  T:  09/19/2003  Job:  [id number]

## 2011-01-23 ENCOUNTER — Encounter (INDEPENDENT_AMBULATORY_CARE_PROVIDER_SITE_OTHER): Payer: Self-pay | Admitting: General Surgery

## 2011-01-23 LAB — CBC
HCT: 39.3
HCT: 29.6 — ABNORMAL LOW
Hemoglobin: 10.1 — ABNORMAL LOW
MCHC: 34.1
MCV: 91.1
MCV: 93
RBC: 4.31
RBC: 3.19 — ABNORMAL LOW
RDW: 13.7
WBC: 7.1

## 2011-01-23 LAB — BASIC METABOLIC PANEL
CO2: 33 — ABNORMAL HIGH
Calcium: 8.2 — ABNORMAL LOW
Chloride: 103
Chloride: 100
GFR calc Af Amer: >60
Glucose, Bld: 149 — ABNORMAL HIGH
Potassium: 4.5
Sodium: 136

## 2011-01-23 LAB — URINALYSIS, ROUTINE W REFLEX MICROSCOPIC
Hgb urine dipstick: NEGATIVE
Nitrite: NEGATIVE
Specific Gravity, Urine: 1.025
Urobilinogen, UA: 0.2

## 2011-01-23 LAB — PROTIME-INR: INR: 1

## 2011-01-23 LAB — DIFFERENTIAL
Eosinophils Absolute: 0.4
Eosinophils Relative: 6 — ABNORMAL HIGH
Lymphocytes Relative: 31
Lymphs Abs: 2.2
Monocytes Relative: 12
Neutrophils Relative %: 50

## 2011-01-23 LAB — PROTEIN, TOTAL: Total Protein: 6.4

## 2011-01-23 LAB — TYPE AND SCREEN
ABO/RH(D): O POS
Antibody Screen: NEGATIVE

## 2011-01-23 LAB — URINE MICROSCOPIC-ADD ON

## 2011-01-23 LAB — AST: AST: 19

## 2011-01-26 ENCOUNTER — Encounter (INDEPENDENT_AMBULATORY_CARE_PROVIDER_SITE_OTHER): Payer: Self-pay | Admitting: General Surgery

## 2011-01-26 ENCOUNTER — Other Ambulatory Visit (INDEPENDENT_AMBULATORY_CARE_PROVIDER_SITE_OTHER): Payer: Self-pay

## 2011-01-26 ENCOUNTER — Ambulatory Visit (INDEPENDENT_AMBULATORY_CARE_PROVIDER_SITE_OTHER): Payer: Medicare Other | Admitting: General Surgery

## 2011-01-26 VITALS — BP 126/82 | HR 70 | Temp 97.3°F | Resp 20 | Ht 60.0 in | Wt 169.8 lb

## 2011-01-26 DIAGNOSIS — K432 Incisional hernia without obstruction or gangrene: Secondary | ICD-10-CM

## 2011-01-26 DIAGNOSIS — R140 Abdominal distension (gaseous): Secondary | ICD-10-CM

## 2011-01-26 LAB — MAGNESIUM
Magnesium: 2
Magnesium: 2
Magnesium: 2.2

## 2011-01-26 LAB — DIFFERENTIAL
Basophils Absolute: 0.1
Basophils Absolute: 0.1
Basophils Relative: 1
Basophils Relative: 1
Eosinophils Absolute: 0
Eosinophils Absolute: 0.6
Eosinophils Relative: 4
Eosinophils Relative: 0
Lymphocytes Relative: 22
Lymphocytes Relative: 29
Lymphs Abs: 2.1
Monocytes Absolute: 0.9
Monocytes Relative: 10
Monocytes Relative: 15 — ABNORMAL HIGH
Neutro Abs: 5.7
Neutrophils Relative %: 79 — ABNORMAL HIGH
Neutrophils Relative %: 47

## 2011-01-26 LAB — URINALYSIS, ROUTINE W REFLEX MICROSCOPIC
Bilirubin Urine: NEGATIVE
Glucose, UA: NEGATIVE
Glucose, UA: NEGATIVE
Hgb urine dipstick: NEGATIVE
Protein, ur: 100 — AB
Protein, ur: NEGATIVE
Urobilinogen, UA: 1
Urobilinogen, UA: 0.2

## 2011-01-26 LAB — COMPREHENSIVE METABOLIC PANEL
ALT: 29
ALT: 16
ALT: 150 — ABNORMAL HIGH
AST: 24
AST: 34
AST: 36
AST: 62 — ABNORMAL HIGH
Albumin: 2.5 — ABNORMAL LOW
Albumin: 2.4 — ABNORMAL LOW
Albumin: 2.6 — ABNORMAL LOW
Alkaline Phosphatase: 88
Alkaline Phosphatase: 113
Alkaline Phosphatase: 90
Alkaline Phosphatase: 135 — ABNORMAL HIGH
BUN: 7
BUN: 19
BUN: 24 — ABNORMAL HIGH
CO2: 32
CO2: 32
CO2: 32
Calcium: 8.5
Calcium: 8.6
Calcium: 9.1
Chloride: 107
Chloride: 98
Chloride: 107
Chloride: 99
Creatinine, Ser: 0.54
Creatinine, Ser: 0.65
Creatinine, Ser: 0.72
GFR calc Af Amer: >60
GFR calc Af Amer: >60
GFR calc Af Amer: >60
GFR calc Af Amer: >60
GFR calc non Af Amer: >60
GFR calc non Af Amer: >60
GFR calc non Af Amer: >60
Glucose, Bld: 166 — ABNORMAL HIGH
Glucose, Bld: 177 — ABNORMAL HIGH
Glucose, Bld: 157 — ABNORMAL HIGH
Glucose, Bld: 172 — ABNORMAL HIGH
Potassium: 3.6
Potassium: 4
Potassium: 3.1 — ABNORMAL LOW
Sodium: 141
Sodium: 136
Sodium: 138
Total Bilirubin: 0.6
Total Bilirubin: 0.7
Total Bilirubin: 0.3
Total Bilirubin: 0.5
Total Protein: 5.4 — ABNORMAL LOW
Total Protein: 5.2 — ABNORMAL LOW
Total Protein: 5.1 — ABNORMAL LOW
Total Protein: 6.2

## 2011-01-26 LAB — PREALBUMIN: Prealbumin: 16.3 — ABNORMAL LOW

## 2011-01-26 LAB — BASIC METABOLIC PANEL
BUN: 12
BUN: 4 — ABNORMAL LOW
BUN: 7
CO2: 24
CO2: 26
CO2: 26
CO2: 26
Calcium: 8.7
Calcium: 8.7
Calcium: 8.6
Calcium: 8.6
Calcium: 8.8
Chloride: 101
Chloride: 105
Chloride: 105
Chloride: 103
Chloride: 98
Creatinine, Ser: 0.5
Creatinine, Ser: 0.55
Creatinine, Ser: 0.55
GFR calc Af Amer: >60
GFR calc Af Amer: >60
GFR calc Af Amer: >60
GFR calc Af Amer: >60
GFR calc Af Amer: >60
GFR calc Af Amer: >60
GFR calc non Af Amer: >60
GFR calc non Af Amer: >60
GFR calc non Af Amer: >60
GFR calc non Af Amer: >60
GFR calc non Af Amer: >60
GFR calc non Af Amer: >60
Glucose, Bld: 104 — ABNORMAL HIGH
Glucose, Bld: 129 — ABNORMAL HIGH
Glucose, Bld: 181 — ABNORMAL HIGH
Potassium: 3.7
Potassium: 3.8
Potassium: 3.8
Potassium: 3.9
Potassium: 3.9
Potassium: 4.1
Sodium: 135
Sodium: 137
Sodium: 138
Sodium: 138
Sodium: 136
Sodium: 137

## 2011-01-26 LAB — CULTURE, BLOOD (ROUTINE X 2): Culture: NO GROWTH

## 2011-01-26 LAB — CLOSTRIDIUM DIFFICILE EIA
C difficile Toxins A+B, EIA: NEGATIVE
C difficile Toxins A+B, EIA: NEGATIVE

## 2011-01-26 LAB — CBC
HCT: 31.5 — ABNORMAL LOW
HCT: 35.9 — ABNORMAL LOW
Hemoglobin: 13.5
Hemoglobin: 12
MCHC: 33.8
MCHC: 33.5
MCHC: 34.1
MCV: 92.8
MCV: 89.8
MCV: 90.3
Platelets: 538 — ABNORMAL HIGH
Platelets: 644 — ABNORMAL HIGH
Platelets: 359
RBC: 4.4
RBC: 3.51 — ABNORMAL LOW
RBC: 3.53 — ABNORMAL LOW
RDW: 13.8
RDW: 14
RDW: 13.8
WBC: 9
WBC: 14.7 — ABNORMAL HIGH
WBC: 7.4

## 2011-01-26 LAB — HEMOGLOBIN A1C
Hgb A1c MFr Bld: 6.2 — ABNORMAL HIGH
Mean Plasma Glucose: 143

## 2011-01-26 LAB — PHOSPHORUS
Phosphorus: 2.5
Phosphorus: 3.8

## 2011-01-26 LAB — CHOLESTEROL, TOTAL: Cholesterol: 155

## 2011-01-26 LAB — TRIGLYCERIDES: Triglycerides: 347 — ABNORMAL HIGH

## 2011-01-26 LAB — URINE CULTURE: Culture: NO GROWTH

## 2011-01-26 NOTE — Progress Notes (Signed)
Chief Complaint  Patient presents with  . Other    Eval umb hernia    HPI Annette Gross is a 70 y.o. female.   HPI  She is referred by Dr. Valentina Lucks for evaluation of an umbilical hernia. She has irritable bowel syndrome. This causes intermittent cramping pains. She noticed a firm nodule to the left of her umbilicus. She showed this to Dr. Valentina Lucks he thought it might be a hernia versus abdominal mass. She underwent a laparoscopic repair of this hernia 12 years ago. Mesh was used. It had been chronically incarcerated. She does a lot of repetitive lifting. No nausea or vomiting secondary to this.  Past Medical History  Diagnosis Date  . Hypertension   . Diabetes mellitus   . Hyperlipidemia   . Degenerative joint disease   . Asthma   . Factor V deficiency   . Depression   . Mononucleosis   . Anemia     Past Surgical History  Procedure Date  . Tonsillectomy   . Hemorrhoid surgery   . Cholecystectomy   . Oophorectomy     left  . Laparoscopic incisional / umbilical / ventral hernia repair     umbilical hernia  . Shoulder surgery     right  . Ganglion cyst excision     right  . Blood vessel tumor removal 1980    from chin    Family History  Problem Relation Age of Onset  . Stroke Father   . Heart disease Father     Social History History  Substance Use Topics  . Smoking status: Not on file  . Smokeless tobacco: Never Used  . Alcohol Use: No    Allergies  Allergen Reactions  . Ivp Dye (Iodinated Diagnostic Agents) Anaphylaxis  . Codeine Other (See Comments)    Severe stomach cramping.  . Lomotil Other (See Comments)    Severe stomach cramping  . Iohexol     Current Outpatient Prescriptions  Medication Sig Dispense Refill  . Cetirizine HCl (ZYRTEC PO) Take by mouth.        . Cholecalciferol (VITAMIN D-3 PO) Take 1,000 mg by mouth daily.        . hyoscyamine (LEVBID) 0.375 MG 12 hr tablet Take 0.375 mg by mouth every 12 (twelve) hours as needed.        .  Multiple Vitamin (MULTIVITAMIN) tablet Take 1 tablet by mouth daily.        . Nutritional Supplements (MELATONIN PO) Take by mouth daily.        Marland Kitchen omeprazole (PRILOSEC) 40 MG capsule Take 40 mg by mouth daily.        Marland Kitchen aspirin 81 MG tablet Take 81 mg by mouth daily.        Marland Kitchen DICLOFENAC POTASSIUM PO Take by mouth.        . Pantoprazole Sodium (PROTONIX PO) Take by mouth.        . Valsartan-Hydrochlorothiazide (DIOVAN HCT PO) Take by mouth.          Review of Systems Review of Systems  Constitutional: Negative for activity change and unexpected weight change.  Respiratory: Negative for chest tightness.   Cardiovascular: Negative for chest pain.  Gastrointestinal:       Chronic intermittent nausea. She has delayed gastric emptying.    Blood pressure 126/82, pulse 70, temperature 97.3 F (36.3 C), temperature source Temporal, resp. rate 20, height 5' (1.524 m), weight 169 lb 12.8 oz (77.021 kg).  Physical Exam Physical Exam  Constitutional: No distress.       Obese.  Cardiovascular: Normal rate and regular rhythm.   No murmur heard. Pulmonary/Chest: Effort normal and breath sounds normal.  Abdominal: Soft. She exhibits mass. She exhibits no distension. There is no tenderness.       There is a bulge to the left of the umbilicus that is not reducible. It is nontender. Small upper abdominal scars are noted.  Musculoskeletal: She exhibits no edema.  Skin: No rash noted.    Data Reviewed Dr. Jone Baseman note. Assessment    Periumbilical mass which could be a soft tissue mass versus recurrent umbilical hernia. Minimal asymptomatic. No obstruction symptoms.    Plan    CT scan of the abdomen and pelvis to define this process. If it is a recurrent hernia, and I recommended open repair with mesh.    I have discussed the procedure, risks, and aftercare. Risks include but are not limited to bleeding, infection, wound healing problems, anesthesia, recurrence, accidental injury to  intra-abdominal organs-such as intestine, liver, spleen, bladder, etc. We also discussed the rare complication of mesh rejection. All questions were answered.  Will await results of the CT scan.  Adlynn Lowenstein J 01/26/2011, 10:27 AM

## 2011-01-30 ENCOUNTER — Ambulatory Visit
Admission: RE | Admit: 2011-01-30 | Discharge: 2011-01-30 | Disposition: A | Discharge status: Home / Self Care | Payer: Medicare Other | Source: Ambulatory Visit | Attending: General Surgery | Admitting: General Surgery

## 2011-01-30 DIAGNOSIS — R140 Abdominal distension (gaseous): Secondary | ICD-10-CM

## 2011-02-04 ENCOUNTER — Telehealth (INDEPENDENT_AMBULATORY_CARE_PROVIDER_SITE_OTHER): Payer: Self-pay

## 2011-02-04 NOTE — Telephone Encounter (Signed)
LM with pt's husband for her to contact our office for CT results.

## 2011-02-10 ENCOUNTER — Telehealth (INDEPENDENT_AMBULATORY_CARE_PROVIDER_SITE_OTHER): Payer: Self-pay | Admitting: General Surgery

## 2011-02-10 NOTE — Telephone Encounter (Signed)
I spoke with Annette Gross about her CT results. This demonstrates an umbilical hernia with some fatty tissue on the right side and fluid on the left side. Mesh is noted in the area and is contracted. She states she really doesn't have a lot of discomfort from this. She wondered if she really had to have the surgery. I told her there were 2 options. First was to proceed with the umbilical hernia repair like we discussed. The second was expected management. This meant that if the hernia became larger or more comfortable that she would come back and see me and we will talk about the surgery again. She prefers not to have an operation at this time due to family issues. Thus she would like to pursue the expected management course. I told her to call us back if became uncomfortable or started getting larger.

## 2011-02-19 ENCOUNTER — Other Ambulatory Visit (HOSPITAL_COMMUNITY): Payer: Self-pay | Admitting: Internal Medicine

## 2011-02-19 DIAGNOSIS — Z1231 Encounter for screening mammogram for malignant neoplasm of breast: Secondary | ICD-10-CM

## 2011-03-11 ENCOUNTER — Ambulatory Visit (HOSPITAL_COMMUNITY)
Admission: RE | Admit: 2011-03-11 | Discharge: 2011-03-11 | Disposition: A | Discharge status: Home / Self Care | Payer: Medicare Other | Source: Ambulatory Visit | Attending: Internal Medicine | Admitting: Internal Medicine

## 2011-03-11 DIAGNOSIS — Z1231 Encounter for screening mammogram for malignant neoplasm of breast: Secondary | ICD-10-CM | POA: Insufficient documentation

## 2011-09-03 ENCOUNTER — Other Ambulatory Visit: Payer: Self-pay | Admitting: Allergy

## 2011-09-03 ENCOUNTER — Ambulatory Visit
Admission: RE | Admit: 2011-09-03 | Discharge: 2011-09-03 | Disposition: A | Discharge status: Home / Self Care | Payer: Medicare Other | Source: Ambulatory Visit | Attending: Allergy | Admitting: Allergy

## 2011-09-03 DIAGNOSIS — J019 Acute sinusitis, unspecified: Secondary | ICD-10-CM

## 2012-03-29 ENCOUNTER — Encounter (INDEPENDENT_AMBULATORY_CARE_PROVIDER_SITE_OTHER): Payer: Self-pay

## 2012-03-29 ENCOUNTER — Other Ambulatory Visit (HOSPITAL_COMMUNITY): Payer: Self-pay | Admitting: Internal Medicine

## 2012-03-29 DIAGNOSIS — Z1231 Encounter for screening mammogram for malignant neoplasm of breast: Secondary | ICD-10-CM

## 2012-04-14 ENCOUNTER — Ambulatory Visit (HOSPITAL_COMMUNITY): Payer: Medicare Other | Attending: Internal Medicine

## 2012-04-15 ENCOUNTER — Ambulatory Visit
Admission: RE | Admit: 2012-04-15 | Discharge: 2012-04-15 | Disposition: A | Discharge status: Home / Self Care | Payer: Medicare Other | Source: Ambulatory Visit | Attending: Internal Medicine | Admitting: Internal Medicine

## 2012-04-15 ENCOUNTER — Other Ambulatory Visit: Payer: Self-pay | Admitting: Internal Medicine

## 2012-04-15 DIAGNOSIS — R1031 Right lower quadrant pain: Secondary | ICD-10-CM

## 2012-04-19 ENCOUNTER — Ambulatory Visit (INDEPENDENT_AMBULATORY_CARE_PROVIDER_SITE_OTHER): Payer: Medicare Other | Admitting: General Surgery

## 2012-04-19 ENCOUNTER — Encounter (INDEPENDENT_AMBULATORY_CARE_PROVIDER_SITE_OTHER): Payer: Self-pay | Admitting: General Surgery

## 2012-04-19 VITALS — BP 118/68 | HR 60 | Temp 98.4°F | Resp 12 | Ht 59.5 in | Wt 175.0 lb

## 2012-04-19 DIAGNOSIS — D2360 Other benign neoplasm of skin of unspecified upper limb, including shoulder: Secondary | ICD-10-CM

## 2012-04-19 NOTE — Progress Notes (Signed)
Subjective:     Patient ID: Annette Gross, female   DOB: July 04, 1940, 71 y.o.   MRN: [id number]  HPI  She is sent over by Dr. Pete Glatter to evaluate a cystic mass in the left upper arm.  She has had this mass for a long time resulting from some allergy shots in the distant past. She recently noticed a tic in this area and removed it subsequently has developed a rash. She states the nodule may be slightly larger since she had a tick bite but it is not painful.   Review of SystemsNo fever or chills. She was treated with doxycycline initially for the tick bite.     Objective:   Physical Exam Gen.-she looks well and is noted to distress.  Musculoskeletal-in the left  upper arm there is a dry rash with underlying 1 cm mobile soft tissue mass.In the right arm there is a 0.5 cm mass in the same area.    Assessment:     Left upper arm subcutaneous mass which patient states has been there for quite some time. There is an overlying rash following a tick bite.  Given the condition of the skin over the subcutaneous mass I do not think it would heal following the procedure. Also, since the mass seems to be fairly chronic unless it becomes larger or painful I do not necessarily think it needs to be removed at this time.    Plan:     I recommended she gets a moisturizing cream with aloe to put on the rash. If this does not work she may need referral to a dermatologist. Return to see me if the subcutaneous mass starts getting larger or becomes painful.

## 2012-04-19 NOTE — Patient Instructions (Signed)
Using moisturizing cream with aloe on the area. If he does not improve within 3 weeks please call us and we can arrange for you to be seen by a dermatologist.  If the area (lump) gets larger please come back and see me.

## 2012-05-06 ENCOUNTER — Ambulatory Visit (HOSPITAL_COMMUNITY)
Admission: RE | Admit: 2012-05-06 | Discharge: 2012-05-06 | Disposition: A | Discharge status: Home / Self Care | Payer: Medicare Other | Source: Ambulatory Visit | Attending: Internal Medicine | Admitting: Internal Medicine

## 2012-05-06 DIAGNOSIS — Z1231 Encounter for screening mammogram for malignant neoplasm of breast: Secondary | ICD-10-CM | POA: Insufficient documentation

## 2012-05-12 ENCOUNTER — Other Ambulatory Visit: Payer: Self-pay | Admitting: Internal Medicine

## 2012-05-12 DIAGNOSIS — R928 Other abnormal and inconclusive findings on diagnostic imaging of breast: Secondary | ICD-10-CM

## 2012-05-23 ENCOUNTER — Other Ambulatory Visit: Payer: Self-pay | Admitting: Dermatology

## 2012-11-29 ENCOUNTER — Encounter: Payer: Medicare Other | Attending: Internal Medicine | Admitting: *Deleted

## 2012-11-29 VITALS — Ht 59.0 in | Wt 169.8 lb

## 2012-11-29 DIAGNOSIS — E119 Type 2 diabetes mellitus without complications: Secondary | ICD-10-CM | POA: Insufficient documentation

## 2012-11-29 DIAGNOSIS — Z713 Dietary counseling and surveillance: Secondary | ICD-10-CM | POA: Insufficient documentation

## 2012-12-02 ENCOUNTER — Encounter: Payer: Self-pay | Admitting: *Deleted

## 2012-12-02 NOTE — Progress Notes (Signed)
Patient was seen on 11/29/2012 for the first of a series of three diabetes self-management courses at the [facility].   Current HbA1c: 6.4% on 11/16/2012  The following learning objectives were met by the patient during this course:   Defines the role of glucose and insulin  Identifies type of diabetes and pathophysiology  Defines the diagnostic criteria for diabetes and prediabetes  States the risk factors for Type 2 Diabetes  States the symptoms of Type 2 Diabetes  Defines Type 2 Diabetes treatment goals  Defines Type 2 Diabetes treatment options  States the rationale for glucose monitoring  Identifies A1C, glucose targets, and testing times  Identifies proper sharps disposal  Defines the purpose of a diabetes food plan  Identifies carbohydrate food groups  Defines effects of carbohydrate foods on glucose levels  Identifies carbohydrate choices/grams/food labels  States benefits of physical activity and effect on glucose  Review of suggested activity guidelines  Handouts given during class include:  Type 2 Diabetes: Basics Book  My Food Plan Book  Food and Activity Log  Your patient has identified their diabetes self-care support plan as:  [facility] support group  Continued diabetes education classes  Follow-Up Plan: Attend core 2 and core 3

## 2012-12-06 ENCOUNTER — Encounter: Payer: Medicare Other | Attending: Internal Medicine

## 2012-12-06 DIAGNOSIS — E119 Type 2 diabetes mellitus without complications: Secondary | ICD-10-CM | POA: Insufficient documentation

## 2012-12-06 DIAGNOSIS — Z713 Dietary counseling and surveillance: Secondary | ICD-10-CM | POA: Insufficient documentation

## 2012-12-07 NOTE — Progress Notes (Signed)
Patient was seen on 12/06/12 for the second of a series of three diabetes self-management courses at the [facility]. The following learning objectives were met by the patient during this course:   Explain basic nutrition maintenance and quality assurance  Describe causes, symptoms and treatment of hypoglycemia and hyperglycemia  Explain how to manage diabetes during illness  Describe the importance of good nutrition for health and healthy eating strategies  List strategies to follow meal plan when dining out  Describe the effects of alcohol on glucose and how to use it safely  Describe problem solving skills for day-to-day glucose challenges  Describe strategies to use when treatment plan needs to change  Identify important factors involved in successful weight loss  Describe ways to remain physically active  Describe the impact of regular activity on insulin resistance  Identify current diabetes medications, their action on blood glucose, and [pssible side effects.  Handouts given in class:  Refrigerator magnet for Sick Day Guidelines  [facility] Oral medication/insulin handout  Your patient has identified their diabetes self-care support plan as:  [facility] support group   Follow-Up Plan: Patient will attend the final class of the ADA Diabetes Self-Care Education.   

## 2012-12-20 DIAGNOSIS — E119 Type 2 diabetes mellitus without complications: Secondary | ICD-10-CM

## 2012-12-22 NOTE — Progress Notes (Signed)
Patient was seen on 12/20/2012 for the third of a series of three diabetes self-management courses at the [facility]. The following learning objectives were met by the patient during this course:    Describe how diabetes changes over time   Identify diabetes complications and ways to prevent them   Describe strategies that can promote heart health including lowering blood pressure and cholesterol   Describe strategies to lower dietary fat and sodium in the diet   Identify physical activities that benefit cardiovascular health   Describe role of stress on blood glucose and develop strategies to address psychosocial issues   Evaluate success in meeting personal goal   Describe the belief that they can live successfully with diabetes day to day   Establish 2-3 goals that they will plan to diligently work on until they return for the free 51-month follow-up visit  The following handouts were given in class:  Goal setting handout  Class evaluation form  Low-sodium seasoning tips  Stress management handout  Your patient has established the following 4 month goals for diabetes self-care:  Reduce amount of sugar in diet  Be active 30 minutes or more 4 times a week  Your patient has identified these potential barriers to change:  No barriers offered today  Your patient has identified their diabetes self-care support plan as:  [facility] support group   Follow-Up Plan: Patient was offered a 4 month follow-up visit for diabetes self-management education.

## 2013-04-19 ENCOUNTER — Ambulatory Visit: Payer: Medicare Other | Admitting: *Deleted

## 2014-02-02 ENCOUNTER — Other Ambulatory Visit (HOSPITAL_COMMUNITY): Payer: Self-pay | Admitting: Internal Medicine

## 2014-02-02 DIAGNOSIS — Z1231 Encounter for screening mammogram for malignant neoplasm of breast: Secondary | ICD-10-CM

## 2014-02-07 ENCOUNTER — Ambulatory Visit (HOSPITAL_COMMUNITY)
Admission: RE | Admit: 2014-02-07 | Discharge: 2014-02-07 | Disposition: A | Discharge status: Home / Self Care | Payer: Medicare Other | Source: Ambulatory Visit | Attending: Internal Medicine | Admitting: Internal Medicine

## 2014-02-07 DIAGNOSIS — Z1231 Encounter for screening mammogram for malignant neoplasm of breast: Secondary | ICD-10-CM | POA: Insufficient documentation

## 2014-02-27 ENCOUNTER — Other Ambulatory Visit: Payer: Self-pay | Admitting: Nurse Practitioner

## 2014-02-27 ENCOUNTER — Ambulatory Visit
Admission: RE | Admit: 2014-02-27 | Discharge: 2014-02-27 | Disposition: A | Discharge status: Home / Self Care | Payer: Medicare Other | Source: Ambulatory Visit | Attending: Nurse Practitioner | Admitting: Nurse Practitioner

## 2014-02-27 DIAGNOSIS — J069 Acute upper respiratory infection, unspecified: Secondary | ICD-10-CM

## 2014-04-11 ENCOUNTER — Ambulatory Visit
Admission: RE | Admit: 2014-04-11 | Discharge: 2014-04-11 | Disposition: A | Discharge status: Home / Self Care | Payer: Medicare Other | Source: Ambulatory Visit | Attending: Allergy and Immunology | Admitting: Allergy and Immunology

## 2014-04-11 ENCOUNTER — Other Ambulatory Visit: Payer: Self-pay | Admitting: Allergy and Immunology

## 2014-04-11 DIAGNOSIS — R059 Cough, unspecified: Secondary | ICD-10-CM

## 2014-04-11 DIAGNOSIS — R05 Cough: Secondary | ICD-10-CM

## 2015-02-05 ENCOUNTER — Other Ambulatory Visit: Payer: Self-pay | Admitting: Internal Medicine

## 2015-02-05 ENCOUNTER — Ambulatory Visit
Admission: RE | Admit: 2015-02-05 | Discharge: 2015-02-05 | Disposition: A | Discharge status: Home / Self Care | Payer: Medicare Other | Source: Ambulatory Visit | Attending: Internal Medicine | Admitting: Internal Medicine

## 2015-02-05 ENCOUNTER — Other Ambulatory Visit: Payer: Self-pay

## 2015-02-05 DIAGNOSIS — R1032 Left lower quadrant pain: Secondary | ICD-10-CM

## 2015-07-11 ENCOUNTER — Other Ambulatory Visit: Payer: Self-pay | Admitting: Gastroenterology

## 2015-07-30 ENCOUNTER — Other Ambulatory Visit: Payer: Self-pay | Admitting: Gastroenterology

## 2015-08-26 ENCOUNTER — Encounter (HOSPITAL_COMMUNITY): Admission: RE | Payer: Self-pay | Source: Ambulatory Visit

## 2015-08-26 ENCOUNTER — Ambulatory Visit (HOSPITAL_COMMUNITY): Admission: RE | Admit: 2015-08-26 | Payer: Medicare Other | Source: Ambulatory Visit | Admitting: Gastroenterology

## 2015-08-26 SURGERY — COLONOSCOPY WITH PROPOFOL
Anesthesia: Monitor Anesthesia Care

## 2015-08-28 ENCOUNTER — Other Ambulatory Visit: Payer: Self-pay | Admitting: Gastroenterology

## 2015-08-30 ENCOUNTER — Encounter (HOSPITAL_COMMUNITY): Payer: Self-pay | Admitting: *Deleted

## 2015-09-02 ENCOUNTER — Ambulatory Visit (HOSPITAL_COMMUNITY): Payer: Medicare Other | Admitting: Anesthesiology

## 2015-09-02 ENCOUNTER — Encounter (HOSPITAL_COMMUNITY)
Admission: RE | Disposition: A | Discharge status: Home / Self Care | Payer: Self-pay | Source: Ambulatory Visit | Attending: Gastroenterology

## 2015-09-02 ENCOUNTER — Ambulatory Visit (HOSPITAL_COMMUNITY)
Admission: RE | Admit: 2015-09-02 | Discharge: 2015-09-02 | Disposition: A | Discharge status: Home / Self Care | Payer: Medicare Other | Source: Ambulatory Visit | Attending: Gastroenterology | Admitting: Gastroenterology

## 2015-09-02 ENCOUNTER — Encounter (HOSPITAL_COMMUNITY): Payer: Self-pay

## 2015-09-02 DIAGNOSIS — E7800 Pure hypercholesterolemia, unspecified: Secondary | ICD-10-CM | POA: Diagnosis not present

## 2015-09-02 DIAGNOSIS — Z1211 Encounter for screening for malignant neoplasm of colon: Secondary | ICD-10-CM | POA: Diagnosis not present

## 2015-09-02 DIAGNOSIS — I10 Essential (primary) hypertension: Secondary | ICD-10-CM | POA: Diagnosis not present

## 2015-09-02 DIAGNOSIS — Z9049 Acquired absence of other specified parts of digestive tract: Secondary | ICD-10-CM | POA: Insufficient documentation

## 2015-09-02 DIAGNOSIS — K5730 Diverticulosis of large intestine without perforation or abscess without bleeding: Secondary | ICD-10-CM | POA: Diagnosis not present

## 2015-09-02 DIAGNOSIS — D122 Benign neoplasm of ascending colon: Secondary | ICD-10-CM | POA: Diagnosis not present

## 2015-09-02 DIAGNOSIS — E1140 Type 2 diabetes mellitus with diabetic neuropathy, unspecified: Secondary | ICD-10-CM | POA: Diagnosis not present

## 2015-09-02 HISTORY — DX: Personal history of other medical treatment: Z92.89

## 2015-09-02 HISTORY — DX: Adverse effect of unspecified anesthetic, initial encounter: T41.45XA

## 2015-09-02 HISTORY — DX: Other complications of anesthesia, initial encounter: T88.59XA

## 2015-09-02 HISTORY — DX: Other specified postprocedural states: Z98.890

## 2015-09-02 HISTORY — PX: COLONOSCOPY WITH PROPOFOL: SHX5780

## 2015-09-02 HISTORY — DX: Other specified postprocedural states: R11.2

## 2015-09-02 LAB — GLUCOSE, CAPILLARY: Glucose-Capillary: 89 mg/dL (ref 65–99)

## 2015-09-02 SURGERY — COLONOSCOPY WITH PROPOFOL
Anesthesia: Monitor Anesthesia Care

## 2015-09-02 MED ORDER — LACTATED RINGERS IV SOLN
INTRAVENOUS | Status: DC
Start: 2015-09-02 — End: 2015-09-02
  Administered 2015-09-02: 07:00:00 via INTRAVENOUS
  Administered 2015-09-02: 1000 mL via INTRAVENOUS

## 2015-09-02 MED ORDER — PROPOFOL 10 MG/ML IV BOLUS
INTRAVENOUS | Status: AC
  Filled 2015-09-02: qty 40

## 2015-09-02 MED ORDER — LIDOCAINE HCL (CARDIAC) 20 MG/ML IV SOLN
INTRAVENOUS | Status: AC
  Filled 2015-09-02: qty 5

## 2015-09-02 MED ORDER — PROPOFOL 10 MG/ML IV BOLUS
INTRAVENOUS | Status: AC
  Filled 2015-09-02: qty 20

## 2015-09-02 MED ORDER — PROPOFOL 500 MG/50ML IV EMUL
INTRAVENOUS | Status: DC | PRN
  Administered 2015-09-02: 100 ug/kg/min via INTRAVENOUS

## 2015-09-02 MED ORDER — PROPOFOL 500 MG/50ML IV EMUL
INTRAVENOUS | Status: DC | PRN
  Administered 2015-09-02 (×3): 20 mg via INTRAVENOUS
  Administered 2015-09-02: 40 mg via INTRAVENOUS
  Administered 2015-09-02 (×2): 20 mg via INTRAVENOUS

## 2015-09-02 SURGICAL SUPPLY — 22 items

## 2015-09-02 NOTE — Op Note (Signed)
[facility] Patient Name: Annette Gross Procedure Date: 09/02/2015 MRN: [id number] Attending MD: Garlan Fair , MD Date of Birth: 1941/02/05 CSN:  Age: 75 Admit Type: Outpatient Procedure:                Colonoscopy Indications:              Screening for colorectal malignant neoplasm Providers:                Garlan Fair, MD, Laverta Baltimore, RN, Alfonso Patten, Technician, Rosario Adie, CRNA Referring MD:              Medicines:                Propofol per Anesthesia Complications:            No immediate complications. Estimated Blood Loss:     Estimated blood loss: none. Procedure:                Pre-Anesthesia Assessment:                           - Prior to the procedure, a History and Physical                            was performed, and patient medications and                            allergies were reviewed. The patient's tolerance of                            previous anesthesia was also reviewed. The risks                            and benefits of the procedure and the sedation                            options and risks were discussed with the patient.                            All questions were answered, and informed consent                            was obtained. Prior Anticoagulants: The patient has                            taken no previous anticoagulant or antiplatelet                            agents. ASA Grade Assessment: III - A patient with                            severe systemic disease. After reviewing the risks  and benefits, the patient was deemed in                            satisfactory condition to undergo the procedure.                           After obtaining informed consent, the colonoscope                            was passed under direct vision. Throughout the                            procedure, the patient's blood pressure, pulse, and            oxygen saturations were monitored continuously. The                            [id number]) scope was introduced through                            the anus and advanced to the the cecum, identified                            by appendiceal orifice and ileocecal valve. The                            colonoscopy was technically difficult and complex                            due to significant looping. The patient tolerated                            the procedure well. The quality of the bowel                            preparation was good. The appendiceal orifice and                            the rectum were photographed. Scope In: 8:13:36 AM Scope Out: 8:45:07 AM Scope Withdrawal Time: 0 hours 12 minutes 51 seconds  Total Procedure Duration: 0 hours 31 minutes 31 seconds  Findings:      The perianal and digital rectal examinations were normal.      A 5 mm polyp was found in the mid ascending colon. The polyp was       sessile. The polyp was removed with a cold biopsy forceps. Resection and       retrieval were complete.      A 3 mm polyp was found in the distal ascending colon. The polyp was       sessile. The polyp was removed with a cold biopsy forceps. Resection and       retrieval were complete.      Multiple small and large-mouthed diverticula were found in the sigmoid       colon.      The exam was otherwise without abnormality. Impression:               -  One 5 mm polyp in the mid ascending colon,                            removed with a cold biopsy forceps. Resected and                            retrieved.                           - One 3 mm polyp in the distal ascending colon,                            removed with a cold biopsy forceps. Resected and                            retrieved.                           - Diverticulosis in the sigmoid colon.                           - The examination was otherwise normal. Moderate Sedation:      N/A- Per  Anesthesia Care Recommendation:           - Patient has a contact number available for                            emergencies. The signs and symptoms of potential                            delayed complications were discussed with the                            patient. Return to normal activities tomorrow.                            Written discharge instructions were provided to the                            patient.                           - Repeat colonoscopy is not recommended for                            surveillance.                           - Resume previous diet.                           - Continue present medications. Procedure Code(s):        --- Professional ---                           [phone], Colonoscopy, flexible; with biopsy, single  or multiple Diagnosis Code(s):        --- Professional ---                           Z12.11, Encounter for screening for malignant                            neoplasm of colon                           D12.2, Benign neoplasm of ascending colon                           K57.30, Diverticulosis of large intestine without                            perforation or abscess without bleeding CPT copyright 2016 American [employer]. All rights reserved. The codes documented in this report are preliminary and upon coder review may  be revised to meet current compliance requirements. Earle Gell, MD Garlan Fair, MD 09/02/2015 8:52:26 AM This report has been signed electronically. Number of Addenda: 0

## 2015-09-02 NOTE — Discharge Instructions (Signed)

## 2015-09-02 NOTE — H&P (Signed)
  Procedure: Screening colonoscopy. 2009 normal colonoscopy to the transverse colon followed by virtual colonoscopy. No colon polyps were seen. October 2016 CT scan of the abdomen and pelvis was consistent with sigmoid colon diverticulitis.  History: The patient is a 74 year old female born [birth year]. She is scheduled to undergo a repeat screening colonoscopy today. She was treated for sigmoid colon diverticulitis in October 2016.  Past medical history: Type 2 diabetes mellitus. Hypercholesterolemia. Mixed irritable bowel syndrome. Interstitial cystitis syndrome. Allergic rhinitis. Gastro assist. Diabetic neuropathy. Anxiety with depression. Rectocele. Liver hemangiomas by MRI performed in 1999. Right foot fracture. Tonsillectomy. Hemorrhoidectomy. Cholecystectomy. Left oophorectomy. Umbilical hernia repair. Right shoulder surgery. Right ganglion cyst surgery. Bilateral salpingo-oophorectomy.  Exam: The patient is alert and lying comfortably on the endoscopy stretcher. Abdomen is soft and nontender to palpation. Lungs are clear to auscultation. Cardiac exam reveals a regular rhythm.  Plan: Proceed with screening colonoscopy

## 2015-09-02 NOTE — Anesthesia Postprocedure Evaluation (Signed)
Anesthesia Post Note  Patient: Annette Gross  Procedure(s) Performed: Procedure(s) (LRB): COLONOSCOPY WITH PROPOFOL (N/A)  Patient location during evaluation: PACU Anesthesia Type: MAC Level of consciousness: awake Pain management: pain level controlled Vital Signs Assessment: post-procedure vital signs reviewed and stable Respiratory status: spontaneous breathing Cardiovascular status: stable Anesthetic complications: no    Last Vitals:  Filed Vitals:   09/02/15 0855 09/02/15 0910  BP: 170/78 195/88  Pulse: 78   Temp: 36.4 C   Resp: 18     Last Pain:  Filed Vitals:   09/02/15 0915  PainSc: 4                  EDWARDS,Fardeen Steinberger

## 2015-09-02 NOTE — Anesthesia Preprocedure Evaluation (Addendum)
Anesthesia Evaluation  Patient identified by MRN, date of birth, ID band Patient awake    History of Anesthesia Complications (+) PONV  Airway Mallampati: II  TM Distance: >3 FB Neck ROM: Full    Dental   Pulmonary asthma ,    breath sounds clear to auscultation       Cardiovascular hypertension,  Rhythm:Regular Rate:Normal     Neuro/Psych    GI/Hepatic Neg liver ROS, GERD  ,  Endo/Other  diabetes  Renal/GU      Musculoskeletal   Abdominal   Peds  Hematology   Anesthesia Other Findings   Reproductive/Obstetrics                            Anesthesia Physical Anesthesia Plan  ASA: III  Anesthesia Plan: MAC   Post-op Pain Management:    Induction: Intravenous  Airway Management Planned: Simple Face Mask  Additional Equipment:   Intra-op Plan:   Post-operative Plan:   Informed Consent: I have reviewed the patients History and Physical, chart, labs and discussed the procedure including the risks, benefits and alternatives for the proposed anesthesia with the patient or authorized representative who has indicated his/her understanding and acceptance.   Dental advisory given  Plan Discussed with: CRNA and Anesthesiologist  Anesthesia Plan Comments:         Anesthesia Quick Evaluation

## 2015-09-02 NOTE — Transfer of Care (Signed)
Immediate Anesthesia Transfer of Care Note  Patient: Annette Gross  Procedure(s) Performed: Procedure(s): COLONOSCOPY WITH PROPOFOL (N/A)  Patient Location: PACU  Anesthesia Type:MAC  Level of Consciousness: awake, alert  and oriented  Airway & Oxygen Therapy: Patient Spontanous Breathing and Patient connected to face mask oxygen  Post-op Assessment: Report given to RN and Post -op Vital signs reviewed and stable  Post vital signs: Reviewed and stable  Last Vitals:  Filed Vitals:   09/02/15 0715  BP: 191/85  Pulse: 82  Temp: 36.7 C  Resp: 14    Last Pain:  Filed Vitals:   09/02/15 0716  PainSc: 4          Complications: No apparent anesthesia complications

## 2015-09-03 ENCOUNTER — Encounter (HOSPITAL_COMMUNITY): Payer: Self-pay | Admitting: Gastroenterology

## 2016-02-17 ENCOUNTER — Other Ambulatory Visit: Payer: Self-pay | Admitting: Internal Medicine

## 2016-02-17 ENCOUNTER — Ambulatory Visit
Admission: RE | Admit: 2016-02-17 | Discharge: 2016-02-17 | Disposition: A | Discharge status: Home / Self Care | Payer: Medicare Other | Source: Ambulatory Visit | Attending: Internal Medicine | Admitting: Internal Medicine

## 2016-02-17 DIAGNOSIS — J3489 Other specified disorders of nose and nasal sinuses: Secondary | ICD-10-CM

## 2016-04-20 ENCOUNTER — Other Ambulatory Visit: Payer: Self-pay | Admitting: Internal Medicine

## 2016-04-20 DIAGNOSIS — R1319 Other dysphagia: Secondary | ICD-10-CM

## 2016-04-21 ENCOUNTER — Ambulatory Visit
Admission: RE | Admit: 2016-04-21 | Discharge: 2016-04-21 | Disposition: A | Discharge status: Home / Self Care | Payer: Medicare Other | Source: Ambulatory Visit | Attending: Internal Medicine | Admitting: Internal Medicine

## 2016-04-21 DIAGNOSIS — R1319 Other dysphagia: Secondary | ICD-10-CM

## 2016-04-23 ENCOUNTER — Other Ambulatory Visit: Payer: Self-pay | Admitting: Internal Medicine

## 2016-04-23 DIAGNOSIS — E041 Nontoxic single thyroid nodule: Secondary | ICD-10-CM

## 2016-05-07 ENCOUNTER — Ambulatory Visit
Admission: RE | Admit: 2016-05-07 | Discharge: 2016-05-07 | Disposition: A | Discharge status: Home / Self Care | Payer: Medicare Other | Source: Ambulatory Visit | Attending: Internal Medicine | Admitting: Internal Medicine

## 2016-05-07 ENCOUNTER — Other Ambulatory Visit (HOSPITAL_COMMUNITY)
Admission: RE | Admit: 2016-05-07 | Discharge: 2016-05-07 | Disposition: A | Discharge status: Home / Self Care | Payer: Medicare Other | Source: Ambulatory Visit | Attending: Radiology | Admitting: Radiology

## 2016-05-07 DIAGNOSIS — E041 Nontoxic single thyroid nodule: Secondary | ICD-10-CM

## 2016-05-27 ENCOUNTER — Ambulatory Visit: Payer: Self-pay | Admitting: General Surgery

## 2016-05-27 NOTE — H&P (Signed)
Annette Gross 05/27/2016 10:20 AM Location: [facility] Patient #: [id number] DOB: Sep 05, 1940 Married / Language: English / Race: White Female  History of Present Illness Odis Hollingshead MD; 05/27/2016 12:03 PM) The patient is a 76 year old female.   Note:She is referred by Dr. Lavone Orn for consultation regarding a left thyroid nodule with atypical follicular cells suspicious for malignancy. This all started out with her having some cervical dysphasia at times. An ultrasound demonstrated multiple small nodules bilaterally and a 1.2 cm nodule in the left upper pole. Fine needle aspiration biopsy was recommended. This was done with the above pathology results. She reports that her TSH level is normal. There is no family history of thyroid cancer. She reports that her sister has hypothyroidism. She reports increased fatigue and having her hair fall out at times. Her husband is here with her.  Her past medical history is notable for type 2 diabetes, depression, peripheral neuropathy, iron deficiency anemia, hyperlipidemia, gastroparesis, bronchitis.  Past Surgical History (April Staton, CMA; 05/27/2016 10:20 AM) Colon Polyp Removal - Colonoscopy Foot Surgery Left. Gallbladder Surgery - Laparoscopic Hemorrhoidectomy Nissen Fundoplication Shoulder Surgery Right. Tonsillectomy Ventral / Umbilical Hernia Surgery Bilateral.  Diagnostic Studies History (April Staton, CMA; 05/27/2016 10:20 AM) Colonoscopy within last year Mammogram 1-3 years ago Pap Smear 1-5 years ago  Allergies (April Staton, CMA; 05/27/2016 10:38 AM) Sulfa Antibiotics Codeine and Related IBUPROFEN ASPIRIN Lipitor *ANTIHYPERLIPIDEMICS* Lexapro *ANTIDEPRESSANTS* Milk-related Compounds Beef/Potatoes/Peas *DIETARY PRODUCTS/DIETARY MANAGEMENT PRODUCTS* Trees Shrimp Mold Spores Dust Cockroaches  Social History (April Staton, CMA; 05/27/2016 10:20 AM) Alcohol use Occasional  alcohol use. Caffeine use Carbonated beverages, Coffee. No drug use Tobacco use Never smoker.  Family History (April Staton, [place]; 05/27/2016 10:20 AM) Cancer Family Members In General. Depression Father. Heart Disease Father. Hypertension Father.  Pregnancy / Birth History (April Staton, [place]; 05/27/2016 10:20 AM) Age at menarche 23 years. Age of menopause [age] Contraceptive History Oral contraceptives. Gravida 6 Length (months) of breastfeeding 12-24 Maternal age 13-25 Para 18  Other Problems (April Staton, CMA; 05/27/2016 10:20 AM) Anxiety Disorder Arthritis Asthma Back Pain Bladder Problems Cholelithiasis Depression Diabetes Mellitus Diverticulosis Gastroesophageal Reflux Disease Hemorrhoids High blood pressure Hypercholesterolemia Oophorectomy Bilateral. Thyroid Cancer Thyroid Disease Transfusion history Umbilical Hernia Repair     Review of Systems (April Staton CMA; 05/27/2016 10:20 AM) General Present- Fatigue and Weight Gain. Not Present- Appetite Loss, Chills, Fever, Night Sweats and Weight Loss. Skin Not Present- Change in Wart/Mole, Dryness, Hives, Jaundice, New Lesions, Non-Healing Wounds, Rash and Ulcer. HEENT Present- Seasonal Allergies, Sinus Pain, Sore Throat and Wears glasses/contact lenses. Not Present- Earache, Hearing Loss, Hoarseness, Nose Bleed, Oral Ulcers, Ringing in the Ears, Visual Disturbances and Yellow Eyes. Respiratory Present- Wheezing. Not Present- Bloody sputum, Chronic Cough, Difficulty Breathing and Snoring. Breast Not Present- Breast Mass, Breast Pain, Nipple Discharge and Skin Changes. Cardiovascular Not Present- Chest Pain, Difficulty Breathing Lying Down, Leg Cramps, Palpitations, Rapid Heart Rate, Shortness of Breath and Swelling of Extremities. Gastrointestinal Present- Abdominal Pain, Bloating, Chronic diarrhea, Constipation, Difficulty Swallowing, Hemorrhoids, Indigestion and Nausea. Not Present-  Bloody Stool, Change in Bowel Habits, Excessive gas, Gets full quickly at meals, Rectal Pain and Vomiting. Female Genitourinary Present- Frequency, Nocturia and Urgency. Not Present- Painful Urination and Pelvic Pain. Musculoskeletal Present- Back Pain, Joint Stiffness and Muscle Weakness. Not Present- Joint Pain, Muscle Pain and Swelling of Extremities. Psychiatric Present- Anxiety and Depression. Not Present- Bipolar, Change in Sleep Pattern, Fearful and Frequent crying. Endocrine Not Present- Cold Intolerance, Excessive Hunger, Hair  Changes, Heat Intolerance, Hot flashes and New Diabetes. Hematology Not Present- Blood Thinners, Easy Bruising, Excessive bleeding, Gland problems, HIV and Persistent Infections.  Vitals (April Staton CMA; 05/27/2016 10:41 AM) 05/27/2016 10:40 AM Weight: 179.38 lb Height: 60in Body Surface Area: 1.78 m Body Mass Index: 35.03 kg/m  Temp.: 98.65F(Oral)  Pulse: 98 (Regular)  BP: 160/100 (Sitting, Left Arm, Standard)      Physical Exam Odis Hollingshead MD; 05/27/2016 12:05 PM)  The physical exam findings are as follows: Note:GENERAL APPEARANCE: Obese female in NAD. Pleasant and cooperative.  EARS, NOSE, MOUTH THROAT: [place]/AT external ears: no lesions or deformities external nose: no lesions or deformities hearing: grossly normal lips: moist, no deformities EYES external: conjunctiva, lids, sclerae normal pupils: equal, round glasses: no  NECK: Supple, no obvious mass or thyroid mass/enlargement, no trachea deviation  CV ascultation: RRR, no murmur extremity edema: no extremity varicosities: no  RESP auscultation: breath sounds equal and clear respiratory effort: normal  GASTROINTESTINAL abdomen: Soft, non-tender, non-distended, no masses liver and spleen: not enlarged. hernia: small epigastric scar: multiple MUSCULOSKELETAL station and gait: normal digits/nails: no clubbing or cyanosis instability: none   LYMPHATIC: No  palpable cervical, supraclavicular adenopathy.  SKIN NEUROLOGIC speech: normal, no hoarseness  PSYCHIATRIC alertness and orientation: normal mood/affect/behavior: normal judgement and insight: normal    Assessment & Plan Odis Hollingshead MD; 05/27/2016 11:20 AM)  LEFT THYROID NODULE (E04.1) Impression: Bethesda level V. We discussed that this implies that there is a 60-75% chance of malignancy. Has multiple small nodules on the right side.  Plan: We discussed partial thyroidectomy and if malignancy is confirmed, going back for completion thyroidectomy versus total thyroidectomy initially. She would prefer proceeding with total thyroidectomy and I am in agreement with this. The procedure and risks of thyroid surgery have been discussed. Risks include but not limited to bleeding, infection, wound healing problems, presence of scar, anesthesia, injury to recurrent laryngeal nerve and permanent hoarseness, permanent hypocalcemia, voice changes, dysphagia. She seems to understand and agrees with the plan.  Jackolyn Confer, M.D.

## 2016-06-29 ENCOUNTER — Encounter (HOSPITAL_COMMUNITY)
Admission: RE | Admit: 2016-06-29 | Discharge: 2016-06-29 | Disposition: A | Discharge status: Home / Self Care | Payer: Medicare Other | Source: Ambulatory Visit | Attending: General Surgery | Admitting: General Surgery

## 2016-06-29 ENCOUNTER — Encounter (HOSPITAL_COMMUNITY): Payer: Self-pay

## 2016-06-29 DIAGNOSIS — Z01810 Encounter for preprocedural cardiovascular examination: Secondary | ICD-10-CM | POA: Insufficient documentation

## 2016-06-29 DIAGNOSIS — K432 Incisional hernia without obstruction or gangrene: Secondary | ICD-10-CM | POA: Insufficient documentation

## 2016-06-29 DIAGNOSIS — I10 Essential (primary) hypertension: Secondary | ICD-10-CM | POA: Diagnosis not present

## 2016-06-29 DIAGNOSIS — E119 Type 2 diabetes mellitus without complications: Secondary | ICD-10-CM | POA: Diagnosis not present

## 2016-06-29 DIAGNOSIS — E785 Hyperlipidemia, unspecified: Secondary | ICD-10-CM | POA: Diagnosis not present

## 2016-06-29 DIAGNOSIS — K219 Gastro-esophageal reflux disease without esophagitis: Secondary | ICD-10-CM | POA: Diagnosis not present

## 2016-06-29 DIAGNOSIS — I4510 Unspecified right bundle-branch block: Secondary | ICD-10-CM | POA: Diagnosis not present

## 2016-06-29 DIAGNOSIS — Z01812 Encounter for preprocedural laboratory examination: Secondary | ICD-10-CM | POA: Diagnosis not present

## 2016-06-29 DIAGNOSIS — D2360 Other benign neoplasm of skin of unspecified upper limb, including shoulder: Secondary | ICD-10-CM | POA: Insufficient documentation

## 2016-06-29 HISTORY — DX: Pneumonia, unspecified organism: J18.9

## 2016-06-29 HISTORY — DX: Fibromyalgia: M79.7

## 2016-06-29 HISTORY — DX: Personal history of other diseases of the digestive system: Z87.19

## 2016-06-29 LAB — COMPREHENSIVE METABOLIC PANEL
ALK PHOS: 110 U/L (ref 38–126)
ALT: 19 U/L (ref 14–54)
ANION GAP: 9 (ref 5–15)
AST: 21 U/L (ref 15–41)
Albumin: 3.9 g/dL (ref 3.5–5.0)
BUN: 16 mg/dL (ref 6–20)
CALCIUM: 9.9 mg/dL (ref 8.9–10.3)
CO2: 26 mmol/L (ref 22–32)
CREATININE: 0.86 mg/dL (ref 0.44–1.00)
Chloride: 101 mmol/L (ref 101–111)
GFR calc non Af Amer: >60 mL/min (ref >60)
GLUCOSE: 184 mg/dL — AB (ref 65–99)
Potassium: 4.4 mmol/L (ref 3.5–5.1)
Sodium: 136 mmol/L (ref 135–145)
TOTAL PROTEIN: 6.7 g/dL (ref 6.5–8.1)
Total Bilirubin: 0.6 mg/dL (ref 0.3–1.2)

## 2016-06-29 LAB — CBC WITH DIFFERENTIAL/PLATELET
Basophils Absolute: 0.1 10*3/uL (ref 0.0–0.1)
Basophils Relative: 1 %
Eosinophils Absolute: 0.2 10*3/uL (ref 0.0–0.7)
Eosinophils Relative: 3 %
HEMATOCRIT: 43.1 % (ref 36.0–46.0)
HEMOGLOBIN: 14.3 g/dL (ref 12.0–15.0)
LYMPHS ABS: 3.5 10*3/uL (ref 0.7–4.0)
LYMPHS PCT: 38 %
MCH: 31.6 pg (ref 26.0–34.0)
MCHC: 33.2 g/dL (ref 30.0–36.0)
MCV: 95.1 fL (ref 78.0–100.0)
MONOS PCT: 11 %
Monocytes Absolute: 1 10*3/uL (ref 0.1–1.0)
NEUTROS ABS: 4.4 10*3/uL (ref 1.7–7.7)
NEUTROS PCT: 47 %
Platelets: 334 10*3/uL (ref 150–400)
RBC: 4.53 MIL/uL (ref 3.87–5.11)
RDW: 12.9 % (ref 11.5–15.5)
WBC: 9.2 10*3/uL (ref 4.0–10.5)

## 2016-06-29 LAB — TSH: TSH: 2.675 u[IU]/mL (ref 0.350–4.500)

## 2016-06-29 NOTE — Pre-Procedure Instructions (Addendum)
     Annette Gross  06/29/2016      [id number] [place], [place] - [address] [place] [address] Phone: [phone] Fax: [place], [place] [facility] [address] [place] [address] [place] [address] Phone: [phone] Fax: [phone]    Your procedure is scheduled on Monday, March 5th   Report to [facility] Admitting at 9:00 AM             (posted surgery time 11:00 am - 1:30 pm)   Call this number if you have problems the MORNING of surgery:  [phone].  [place] is closed on weekends.     Remember:  Do not eat food or drink liquids after midnight Sunday.   Take these medicines the morning of surgery with A SIP OF WATER : Cymbalta, Levothyroxine, Omeprazole.             4-5 days prior to surgery, STOP taking any Vitamins, Herbal Supplements, Anti-inflammatories   Do not wear jewelry, make-up or nail polish.  Do not wear lotions, powders,  perfumes, or deoderant.  Do not shave underarms & legs 48 hours prior to surgery.     Do not bring valuables to the hospital.  [facility] is not responsible for any belongings or valuables.  Contacts, dentures or bridgework may not be worn into surgery.  Leave your suitcase in the car.  After surgery it may be brought to your room.  Please read over the following fact sheets that you were given. Pain Booklet and Surgical Site Infection Prevention

## 2016-06-29 NOTE — Progress Notes (Addendum)
PCP is Dr. Lavone Orn  LOv 06/2016 for her yrly physical Denies any current cardiac issues.  She states that she had some "heart tests, done 20 yrs ago", results were negative Diagnosed "pre" diabetic -- diet controlled and takes no meds

## 2016-06-30 LAB — GLUCOSE, CAPILLARY: Glucose-Capillary: 222 mg/dL — ABNORMAL HIGH (ref 65–99)

## 2016-06-30 LAB — HEMOGLOBIN A1C
HEMOGLOBIN A1C: 7.3 % — AB (ref 4.8–5.6)
MEAN PLASMA GLUCOSE: 163 mg/dL

## 2016-07-06 ENCOUNTER — Inpatient Hospital Stay (HOSPITAL_COMMUNITY)
Admission: AD | Admit: 2016-07-06 | Discharge: 2016-07-10 | DRG: 625 | Disposition: A | Discharge status: Home / Self Care | Payer: Medicare Other | Source: Ambulatory Visit | Attending: General Surgery | Admitting: General Surgery

## 2016-07-06 ENCOUNTER — Encounter (HOSPITAL_COMMUNITY): Payer: Self-pay | Admitting: Urology

## 2016-07-06 ENCOUNTER — Encounter (HOSPITAL_COMMUNITY)
Admission: AD | Disposition: A | Discharge status: Home / Self Care | Payer: Self-pay | Source: Ambulatory Visit | Attending: General Surgery

## 2016-07-06 ENCOUNTER — Ambulatory Visit (HOSPITAL_COMMUNITY): Payer: Medicare Other | Admitting: Certified Registered"

## 2016-07-06 DIAGNOSIS — Z91013 Allergy to seafood: Secondary | ICD-10-CM

## 2016-07-06 DIAGNOSIS — E876 Hypokalemia: Secondary | ICD-10-CM | POA: Diagnosis not present

## 2016-07-06 DIAGNOSIS — R062 Wheezing: Secondary | ICD-10-CM

## 2016-07-06 DIAGNOSIS — Z91041 Radiographic dye allergy status: Secondary | ICD-10-CM

## 2016-07-06 DIAGNOSIS — E785 Hyperlipidemia, unspecified: Secondary | ICD-10-CM | POA: Diagnosis present

## 2016-07-06 DIAGNOSIS — E46 Unspecified protein-calorie malnutrition: Secondary | ICD-10-CM | POA: Diagnosis present

## 2016-07-06 DIAGNOSIS — Z888 Allergy status to other drugs, medicaments and biological substances status: Secondary | ICD-10-CM

## 2016-07-06 DIAGNOSIS — Z808 Family history of malignant neoplasm of other organs or systems: Secondary | ICD-10-CM

## 2016-07-06 DIAGNOSIS — M545 Low back pain: Secondary | ICD-10-CM | POA: Diagnosis present

## 2016-07-06 DIAGNOSIS — Z91018 Allergy to other foods: Secondary | ICD-10-CM

## 2016-07-06 DIAGNOSIS — E041 Nontoxic single thyroid nodule: Secondary | ICD-10-CM | POA: Diagnosis present

## 2016-07-06 DIAGNOSIS — Z91011 Allergy to milk products: Secondary | ICD-10-CM

## 2016-07-06 DIAGNOSIS — Z91048 Other nonmedicinal substance allergy status: Secondary | ICD-10-CM

## 2016-07-06 DIAGNOSIS — Z6835 Body mass index (BMI) 35.0-35.9, adult: Secondary | ICD-10-CM

## 2016-07-06 DIAGNOSIS — Y95 Nosocomial condition: Secondary | ICD-10-CM | POA: Diagnosis not present

## 2016-07-06 DIAGNOSIS — J45909 Unspecified asthma, uncomplicated: Secondary | ICD-10-CM | POA: Diagnosis present

## 2016-07-06 DIAGNOSIS — E871 Hypo-osmolality and hyponatremia: Secondary | ICD-10-CM | POA: Diagnosis present

## 2016-07-06 DIAGNOSIS — M4800 Spinal stenosis, site unspecified: Secondary | ICD-10-CM | POA: Diagnosis present

## 2016-07-06 DIAGNOSIS — E1142 Type 2 diabetes mellitus with diabetic polyneuropathy: Secondary | ICD-10-CM | POA: Diagnosis present

## 2016-07-06 DIAGNOSIS — R0981 Nasal congestion: Secondary | ICD-10-CM | POA: Diagnosis present

## 2016-07-06 DIAGNOSIS — C73 Malignant neoplasm of thyroid gland: Principal | ICD-10-CM | POA: Diagnosis present

## 2016-07-06 DIAGNOSIS — Z885 Allergy status to narcotic agent status: Secondary | ICD-10-CM

## 2016-07-06 DIAGNOSIS — J329 Chronic sinusitis, unspecified: Secondary | ICD-10-CM | POA: Diagnosis present

## 2016-07-06 DIAGNOSIS — F329 Major depressive disorder, single episode, unspecified: Secondary | ICD-10-CM | POA: Diagnosis present

## 2016-07-06 DIAGNOSIS — E1143 Type 2 diabetes mellitus with diabetic autonomic (poly)neuropathy: Secondary | ICD-10-CM | POA: Diagnosis present

## 2016-07-06 DIAGNOSIS — E042 Nontoxic multinodular goiter: Secondary | ICD-10-CM | POA: Diagnosis present

## 2016-07-06 DIAGNOSIS — F419 Anxiety disorder, unspecified: Secondary | ICD-10-CM | POA: Diagnosis present

## 2016-07-06 DIAGNOSIS — K3184 Gastroparesis: Secondary | ICD-10-CM | POA: Diagnosis present

## 2016-07-06 DIAGNOSIS — Z882 Allergy status to sulfonamides status: Secondary | ICD-10-CM

## 2016-07-06 DIAGNOSIS — Z79899 Other long term (current) drug therapy: Secondary | ICD-10-CM

## 2016-07-06 DIAGNOSIS — J189 Pneumonia, unspecified organism: Secondary | ICD-10-CM | POA: Diagnosis not present

## 2016-07-06 DIAGNOSIS — K9189 Other postprocedural complications and disorders of digestive system: Secondary | ICD-10-CM | POA: Diagnosis not present

## 2016-07-06 DIAGNOSIS — M797 Fibromyalgia: Secondary | ICD-10-CM | POA: Diagnosis present

## 2016-07-06 DIAGNOSIS — Z886 Allergy status to analgesic agent status: Secondary | ICD-10-CM

## 2016-07-06 DIAGNOSIS — K219 Gastro-esophageal reflux disease without esophagitis: Secondary | ICD-10-CM | POA: Diagnosis present

## 2016-07-06 DIAGNOSIS — M479 Spondylosis, unspecified: Secondary | ICD-10-CM | POA: Diagnosis present

## 2016-07-06 DIAGNOSIS — I10 Essential (primary) hypertension: Secondary | ICD-10-CM | POA: Diagnosis present

## 2016-07-06 DIAGNOSIS — G8929 Other chronic pain: Secondary | ICD-10-CM | POA: Diagnosis present

## 2016-07-06 DIAGNOSIS — D682 Hereditary deficiency of other clotting factors: Secondary | ICD-10-CM | POA: Diagnosis present

## 2016-07-06 DIAGNOSIS — Z881 Allergy status to other antibiotic agents status: Secondary | ICD-10-CM

## 2016-07-06 HISTORY — PX: TOTAL THYROIDECTOMY: SHX2547

## 2016-07-06 HISTORY — PX: THYROIDECTOMY: SHX17

## 2016-07-06 LAB — BASIC METABOLIC PANEL
Anion gap: 13 (ref 5–15)
BUN: 8 mg/dL (ref 6–20)
CHLORIDE: 94 mmol/L — AB (ref 101–111)
CO2: 24 mmol/L (ref 22–32)
CREATININE: 0.59 mg/dL (ref 0.44–1.00)
Calcium: 8.4 mg/dL — ABNORMAL LOW (ref 8.9–10.3)
GFR calc non Af Amer: >60 mL/min (ref >60)
Glucose, Bld: 250 mg/dL — ABNORMAL HIGH (ref 65–99)
POTASSIUM: 3.6 mmol/L (ref 3.5–5.1)
SODIUM: 131 mmol/L — AB (ref 135–145)

## 2016-07-06 LAB — GLUCOSE, CAPILLARY
GLUCOSE-CAPILLARY: 125 mg/dL — AB (ref 65–99)
GLUCOSE-CAPILLARY: 163 mg/dL — AB (ref 65–99)
GLUCOSE-CAPILLARY: 229 mg/dL — AB (ref 65–99)
Glucose-Capillary: 129 mg/dL — ABNORMAL HIGH (ref 65–99)

## 2016-07-06 SURGERY — THYROIDECTOMY
Anesthesia: General | Site: Neck

## 2016-07-06 MED ORDER — ROCURONIUM BROMIDE 50 MG/5ML IV SOSY
PREFILLED_SYRINGE | INTRAVENOUS | Status: AC
  Filled 2016-07-06: qty 5

## 2016-07-06 MED ORDER — PROMETHAZINE HCL 25 MG/ML IJ SOLN
6.2500 mg | Freq: Once | INTRAMUSCULAR | Status: AC
  Administered 2016-07-06: 6.25 mg via INTRAVENOUS

## 2016-07-06 MED ORDER — CEFAZOLIN SODIUM-DEXTROSE 2-4 GM/100ML-% IV SOLN
INTRAVENOUS | Status: AC
  Filled 2016-07-06: qty 100

## 2016-07-06 MED ORDER — ENALAPRILAT 1.25 MG/ML IV SOLN
1.2500 mg | Freq: Four times a day (QID) | INTRAVENOUS | Status: DC | PRN
  Filled 2016-07-06 (×2): qty 1

## 2016-07-06 MED ORDER — DIPHENHYDRAMINE HCL 50 MG/ML IJ SOLN
INTRAMUSCULAR | Status: AC
  Filled 2016-07-06: qty 1

## 2016-07-06 MED ORDER — FENTANYL CITRATE (PF) 100 MCG/2ML IJ SOLN
INTRAMUSCULAR | Status: AC
  Filled 2016-07-06: qty 2

## 2016-07-06 MED ORDER — ACETAMINOPHEN 325 MG PO TABS
650.0000 mg | ORAL_TABLET | Freq: Four times a day (QID) | ORAL | Status: DC | PRN
  Administered 2016-07-07 – 2016-07-10 (×4): 650 mg via ORAL
  Filled 2016-07-06 (×4): qty 2

## 2016-07-06 MED ORDER — CHLORHEXIDINE GLUCONATE CLOTH 2 % EX PADS: 6.0000 | MEDICATED_PAD | Freq: Once | CUTANEOUS | Status: DC

## 2016-07-06 MED ORDER — KCL IN DEXTROSE-NACL 20-5-0.9 MEQ/L-%-% IV SOLN
INTRAVENOUS | Status: DC
  Administered 2016-07-06 – 2016-07-07 (×2): via INTRAVENOUS
  Filled 2016-07-06 (×3): qty 1000

## 2016-07-06 MED ORDER — DULOXETINE HCL 30 MG PO CPEP
30.0000 mg | ORAL_CAPSULE | Freq: Every day | ORAL | Status: DC
  Administered 2016-07-07 – 2016-07-10 (×4): 30 mg via ORAL
  Filled 2016-07-06 (×4): qty 1

## 2016-07-06 MED ORDER — FENTANYL CITRATE (PF) 100 MCG/2ML IJ SOLN
INTRAMUSCULAR | Status: DC | PRN
  Administered 2016-07-06: 25 ug via INTRAVENOUS
  Administered 2016-07-06: 50 ug via INTRAVENOUS
  Administered 2016-07-06: 25 ug via INTRAVENOUS
  Administered 2016-07-06: 100 ug via INTRAVENOUS

## 2016-07-06 MED ORDER — BUPIVACAINE HCL (PF) 0.5 % IJ SOLN
INTRAMUSCULAR | Status: DC | PRN
  Administered 2016-07-06: 30 mL

## 2016-07-06 MED ORDER — CEFAZOLIN SODIUM-DEXTROSE 2-4 GM/100ML-% IV SOLN
2.0000 g | INTRAVENOUS | Status: AC
  Administered 2016-07-06: 2 g via INTRAVENOUS

## 2016-07-06 MED ORDER — PHENYLEPHRINE HCL 10 MG/ML IJ SOLN
INTRAMUSCULAR | Status: DC | PRN
  Administered 2016-07-06: 40 ug via INTRAVENOUS
  Administered 2016-07-06 (×2): 80 ug via INTRAVENOUS
  Administered 2016-07-06: 120 ug via INTRAVENOUS

## 2016-07-06 MED ORDER — LABETALOL HCL 5 MG/ML IV SOLN
INTRAVENOUS | Status: AC
  Filled 2016-07-06: qty 4

## 2016-07-06 MED ORDER — METOCLOPRAMIDE HCL 5 MG/ML IJ SOLN
INTRAMUSCULAR | Status: AC
  Filled 2016-07-06: qty 2

## 2016-07-06 MED ORDER — PROPOFOL 10 MG/ML IV BOLUS
INTRAVENOUS | Status: DC | PRN
  Administered 2016-07-06: 150 mg via INTRAVENOUS

## 2016-07-06 MED ORDER — ALBUTEROL SULFATE (2.5 MG/3ML) 0.083% IN NEBU
2.5000 mg | INHALATION_SOLUTION | RESPIRATORY_TRACT | Status: DC | PRN
Start: 2016-07-06 — End: 2016-07-08
  Administered 2016-07-06 – 2016-07-08 (×5): 2.5 mg via RESPIRATORY_TRACT
  Filled 2016-07-06 (×5): qty 3

## 2016-07-06 MED ORDER — SUGAMMADEX SODIUM 200 MG/2ML IV SOLN
INTRAVENOUS | Status: DC | PRN
  Administered 2016-07-06: 200 mg via INTRAVENOUS

## 2016-07-06 MED ORDER — LORATADINE 10 MG PO TABS
10.0000 mg | ORAL_TABLET | Freq: Every day | ORAL | Status: DC
  Administered 2016-07-06 – 2016-07-09 (×4): 10 mg via ORAL
  Filled 2016-07-06 (×4): qty 1

## 2016-07-06 MED ORDER — PANTOPRAZOLE SODIUM 40 MG PO TBEC
40.0000 mg | DELAYED_RELEASE_TABLET | Freq: Every day | ORAL | Status: DC
  Administered 2016-07-07 – 2016-07-10 (×4): 40 mg via ORAL
  Filled 2016-07-06 (×4): qty 1

## 2016-07-06 MED ORDER — ALUM & MAG HYDROXIDE-SIMETH 200-200-20 MG/5 ML NICU TOPICAL
1.0000 "application " | TOPICAL | Status: DC | PRN
  Filled 2016-07-06: qty 355

## 2016-07-06 MED ORDER — ONDANSETRON HCL 4 MG/2ML IJ SOLN
4.0000 mg | INTRAMUSCULAR | Status: DC | PRN
  Administered 2016-07-06 – 2016-07-10 (×10): 4 mg via INTRAVENOUS
  Filled 2016-07-06 (×10): qty 2

## 2016-07-06 MED ORDER — DOCUSATE SODIUM 100 MG PO CAPS
100.0000 mg | ORAL_CAPSULE | Freq: Two times a day (BID) | ORAL | Status: DC
Start: 2016-07-06 — End: 2016-07-10
  Administered 2016-07-06 – 2016-07-10 (×7): 100 mg via ORAL
  Filled 2016-07-06 (×8): qty 1

## 2016-07-06 MED ORDER — ALUM & MAG HYDROXIDE-SIMETH 200-200-20 MG/5ML PO SUSP: 15.0000 mL | ORAL | Status: DC | PRN

## 2016-07-06 MED ORDER — LACTATED RINGERS IV SOLN
INTRAVENOUS | Status: DC
  Administered 2016-07-06 (×2): via INTRAVENOUS

## 2016-07-06 MED ORDER — ONDANSETRON HCL 4 MG/2ML IJ SOLN
INTRAMUSCULAR | Status: DC | PRN
  Administered 2016-07-06: 4 mg via INTRAVENOUS

## 2016-07-06 MED ORDER — PROMETHAZINE HCL 25 MG/ML IJ SOLN
INTRAMUSCULAR | Status: AC
  Filled 2016-07-06: qty 1

## 2016-07-06 MED ORDER — SUGAMMADEX SODIUM 200 MG/2ML IV SOLN
INTRAVENOUS | Status: AC
  Filled 2016-07-06: qty 2

## 2016-07-06 MED ORDER — BUPIVACAINE HCL (PF) 0.5 % IJ SOLN
INTRAMUSCULAR | Status: AC
  Filled 2016-07-06: qty 30

## 2016-07-06 MED ORDER — HYDROCODONE-ACETAMINOPHEN 5-325 MG PO TABS
1.0000 | ORAL_TABLET | ORAL | Status: DC | PRN
  Administered 2016-07-07 – 2016-07-09 (×4): 2 via ORAL
  Filled 2016-07-06 (×5): qty 2

## 2016-07-06 MED ORDER — ESMOLOL HCL 100 MG/10ML IV SOLN
INTRAVENOUS | Status: DC | PRN
  Administered 2016-07-06 (×3): 20 mg via INTRAVENOUS

## 2016-07-06 MED ORDER — PROPOFOL 10 MG/ML IV BOLUS
INTRAVENOUS | Status: AC
  Filled 2016-07-06: qty 20

## 2016-07-06 MED ORDER — MORPHINE SULFATE (PF) 2 MG/ML IV SOLN
2.0000 mg | INTRAVENOUS | Status: DC | PRN
  Administered 2016-07-06 – 2016-07-07 (×5): 2 mg via INTRAVENOUS
  Filled 2016-07-06 (×5): qty 1

## 2016-07-06 MED ORDER — 0.9 % SODIUM CHLORIDE (POUR BTL) OPTIME
TOPICAL | Status: DC | PRN
  Administered 2016-07-06: 1000 mL

## 2016-07-06 MED ORDER — LIDOCAINE HCL 4 % EX SOLN
CUTANEOUS | Status: DC | PRN
  Administered 2016-07-06: 2.5 mL via TOPICAL

## 2016-07-06 MED ORDER — METOCLOPRAMIDE HCL 5 MG/ML IJ SOLN
10.0000 mg | Freq: Once | INTRAMUSCULAR | Status: AC | PRN
  Administered 2016-07-06: 10 mg via INTRAVENOUS

## 2016-07-06 MED ORDER — FENTANYL CITRATE (PF) 100 MCG/2ML IJ SOLN
25.0000 ug | INTRAMUSCULAR | Status: DC | PRN
  Administered 2016-07-06 (×3): 50 ug via INTRAVENOUS

## 2016-07-06 MED ORDER — ALUM & MAG HYDROXIDE-SIMETH 200-200-20 MG/5ML PO SUSP
30.0000 mL | Freq: Every day | ORAL | Status: DC | PRN
  Administered 2016-07-06: 30 mL via ORAL
  Filled 2016-07-06: qty 30

## 2016-07-06 MED ORDER — ROCURONIUM BROMIDE 100 MG/10ML IV SOLN
INTRAVENOUS | Status: DC | PRN
  Administered 2016-07-06: 10 mg via INTRAVENOUS
  Administered 2016-07-06: 50 mg via INTRAVENOUS

## 2016-07-06 MED ORDER — LEVOCETIRIZINE DIHYDROCHLORIDE 5 MG PO TABS: 5.0000 mg | ORAL_TABLET | Freq: Every evening | ORAL | Status: DC

## 2016-07-06 MED ORDER — LABETALOL HCL 5 MG/ML IV SOLN
10.0000 mg | Freq: Once | INTRAVENOUS | Status: AC
  Administered 2016-07-06: 10 mg via INTRAVENOUS

## 2016-07-06 MED ORDER — LEVOTHYROXINE SODIUM 100 MCG PO TABS
100.0000 ug | ORAL_TABLET | Freq: Every day | ORAL | Status: DC
  Administered 2016-07-07 – 2016-07-10 (×4): 100 ug via ORAL
  Filled 2016-07-06 (×4): qty 1

## 2016-07-06 MED ORDER — DIPHENHYDRAMINE HCL 50 MG/ML IJ SOLN
INTRAMUSCULAR | Status: DC | PRN
  Administered 2016-07-06: 12.5 mg via INTRAVENOUS

## 2016-07-06 MED ORDER — CALCIUM CARBONATE ANTACID 500 MG PO CHEW
2.0000 | CHEWABLE_TABLET | Freq: Three times a day (TID) | ORAL | Status: DC
  Administered 2016-07-06 – 2016-07-07 (×2): 400 mg via ORAL
  Filled 2016-07-06 (×2): qty 2

## 2016-07-06 MED ORDER — ONDANSETRON 4 MG PO TBDP: 4.0000 mg | ORAL_TABLET | Freq: Four times a day (QID) | ORAL | Status: DC | PRN

## 2016-07-06 MED ORDER — LIDOCAINE HCL (CARDIAC) 20 MG/ML IV SOLN
INTRAVENOUS | Status: DC | PRN
Start: 2016-07-06 — End: 2016-07-06
  Administered 2016-07-06: 100 mg via INTRAVENOUS

## 2016-07-06 MED ORDER — HYDRALAZINE HCL 20 MG/ML IJ SOLN
10.0000 mg | INTRAMUSCULAR | Status: DC | PRN
  Administered 2016-07-06 – 2016-07-08 (×3): 10 mg via INTRAVENOUS
  Filled 2016-07-06 (×3): qty 1

## 2016-07-06 MED ORDER — MIDAZOLAM HCL 2 MG/2ML IJ SOLN
INTRAMUSCULAR | Status: AC
  Filled 2016-07-06: qty 2

## 2016-07-06 SURGICAL SUPPLY — 52 items
APL SKNCLS STERI-STRIP NONHPOA (GAUZE/BANDAGES/DRESSINGS) ×1
BENZOIN TINCTURE PRP APPL 2/3 (GAUZE/BANDAGES/DRESSINGS) ×3 IMPLANT
BLADE CLIPPER SURG (BLADE) IMPLANT
BLADE SURG 10 STRL SS (BLADE) ×1 IMPLANT
BLADE SURG 15 STRL LF DISP TIS (BLADE) ×1 IMPLANT
BLADE SURG 15 STRL SS (BLADE) ×3
CANISTER SUCT 3000ML PPV (MISCELLANEOUS) ×3 IMPLANT
CHLORAPREP W/TINT 10.5 ML (MISCELLANEOUS) ×3 IMPLANT
CLIP TI MEDIUM 24 (CLIP) ×3 IMPLANT
CLIP TI WIDE RED SMALL 24 (CLIP) ×3 IMPLANT
CLOSURE WOUND 1/2 X4 (GAUZE/BANDAGES/DRESSINGS) ×1
COVER SURGICAL LIGHT HANDLE (MISCELLANEOUS) ×3 IMPLANT
CRADLE DONUT ADULT HEAD (MISCELLANEOUS) ×3 IMPLANT
DECANTER SPIKE VIAL GLASS SM (MISCELLANEOUS) ×2 IMPLANT
DRAPE LAPAROTOMY 100X72 PEDS (DRAPES) ×3 IMPLANT
DRAPE UTILITY XL STRL (DRAPES) ×3 IMPLANT
ELECT CAUTERY BLADE 6.4 (BLADE) ×3 IMPLANT
ELECT REM PT RETURN 9FT ADLT (ELECTROSURGICAL) ×3
ELECTRODE REM PT RTRN 9FT ADLT (ELECTROSURGICAL) ×1 IMPLANT
GAUZE SPONGE 4X4 12PLY STRL (GAUZE/BANDAGES/DRESSINGS) ×3 IMPLANT
GAUZE SPONGE 4X4 16PLY XRAY LF (GAUZE/BANDAGES/DRESSINGS) ×5 IMPLANT
GLOVE BIOGEL PI IND STRL 8 (GLOVE) ×1 IMPLANT
GLOVE BIOGEL PI INDICATOR 8 (GLOVE) ×2
GLOVE ECLIPSE 8.0 STRL XLNG CF (GLOVE) ×3 IMPLANT
GOWN STRL REUS W/ TWL LRG LVL3 (GOWN DISPOSABLE) ×2 IMPLANT
GOWN STRL REUS W/TWL LRG LVL3 (GOWN DISPOSABLE) ×6
HEMOSTAT SURGICEL 2X4 FIBR (HEMOSTASIS) ×3 IMPLANT
ILLUMINATOR WAVEGUIDE N/F (MISCELLANEOUS) ×2 IMPLANT
KIT BASIN OR (CUSTOM PROCEDURE TRAY) ×3 IMPLANT
KIT ROOM TURNOVER OR (KITS) ×3 IMPLANT
NDL HYPO 25GX1X1/2 BEV (NEEDLE) IMPLANT
NEEDLE HYPO 25GX1X1/2 BEV (NEEDLE) ×3 IMPLANT
NS IRRIG 1000ML POUR BTL (IV SOLUTION) ×3 IMPLANT
PACK SURGICAL SETUP 50X90 (CUSTOM PROCEDURE TRAY) ×3 IMPLANT
PAD ARMBOARD 7.5X6 YLW CONV (MISCELLANEOUS) ×3 IMPLANT
PENCIL BUTTON HOLSTER BLD 10FT (ELECTRODE) ×3 IMPLANT
SHEARS HARMONIC 9CM CVD (BLADE) ×3 IMPLANT
SPECIMEN JAR MEDIUM (MISCELLANEOUS) ×3 IMPLANT
SPONGE INTESTINAL PEANUT (DISPOSABLE) ×3 IMPLANT
STRIP CLOSURE SKIN 1/2X4 (GAUZE/BANDAGES/DRESSINGS) ×2 IMPLANT
SUT MON AB 4-0 PC3 18 (SUTURE) ×3 IMPLANT
SUT SILK 2 0 (SUTURE) ×3
SUT SILK 2-0 18XBRD TIE 12 (SUTURE) ×1 IMPLANT
SUT SILK 3 0 (SUTURE) ×3
SUT SILK 3-0 18XBRD TIE 12 (SUTURE) ×1 IMPLANT
SUT VIC AB 3-0 SH 18 (SUTURE) ×5 IMPLANT
SYR BULB 3OZ (MISCELLANEOUS) ×3 IMPLANT
SYR CONTROL 10ML LL (SYRINGE) ×2 IMPLANT
TOWEL OR 17X24 6PK STRL BLUE (TOWEL DISPOSABLE) ×3 IMPLANT
TOWEL OR 17X26 10 PK STRL BLUE (TOWEL DISPOSABLE) ×1 IMPLANT
TUBE CONNECTING 12'X1/4 (SUCTIONS) ×1
TUBE CONNECTING 12X1/4 (SUCTIONS) ×2 IMPLANT

## 2016-07-06 NOTE — Transfer of Care (Cosign Needed)
Immediate Anesthesia Transfer of Care Note  Patient: Annette Gross  Procedure(s) Performed: Procedure(s): TOTAL THYROIDECTOMY (N/A)  Patient Location: PACU  Anesthesia Type:General  Level of Consciousness: awake, alert  and oriented  Airway & Oxygen Therapy: Patient Spontanous Breathing and Patient connected to nasal cannula oxygen  Post-op Assessment: Report given to RN and Post -op Vital signs reviewed and stable  Post vital signs: Reviewed and stable  Last Vitals:  Vitals:   07/06/16 0853  BP: (!) 153/74  Pulse: 91  Resp: 18  Temp: 36.7 C    Last Pain:  Vitals:   07/06/16 0853  TempSrc: Oral         Complications: No apparent anesthesia complications

## 2016-07-06 NOTE — Progress Notes (Signed)
   07/06/16 1733  Vitals  BP (!) 183/69  Dr. Redmond Pulling notified and orders received.

## 2016-07-06 NOTE — Op Note (Signed)
OPERATIVE NOTE:  Pre-operative Diagnosis: Dysplastic left thyroid nodule (Bethesda 5)  Post-operative Diagnosis:  Same  Procedure:  Total thyroidectomy  Surgeon:  Jackolyn Confer, M.D.  Assistant: Armandina Gemma MD   Anesthesia:  General  Estimated Blood Loss: Less than 100 cc          Specimen: thyroid to pathology  Indications: This is a 76 year old female who has multiple thyroid nodules.  She had a greater than 1 cm in total the left lobe of the thyroid gland.  Needle biopsy demonstrated dysplastic thyroid cells in the Bethesda level 5.  She now presents for total thyroidectomy.  Procedure Details:  She was brought to the operating room and placed in a supine position on the operating room table.  Following administration of general anesthesia, the patient was positioned and then prepped and draped in the usual aseptic fashion.  A timeout was performed.    After ascertaining that an adequate level of anesthesia had been achieved, a lower transverse neck incision was made with #15 blade.  Dissection was carried through subcutaneous tissues and platysma. Hemostasis was achieved with the electrocautery.  Subplatysmal flaps were elevated cephalad and caudad from the thyroid notch to the sternal notch.  The [place] self-retaining retractor was placed for exposure.  Strap muscles were incised in the midline and dissection was begun on the left side.  Strap muscles were reflected laterally.  Left thyroid lobe was identified and the nodule palpated.  The left lobe was gently mobilized with blunt dissection staying on the thyroid gland capsule.  Superior pole vessels were dissected out and divided individually between small and medium Ligaclips with the Harmonic scalpel.  The thyroid lobe was rolled anteriorly.  Branches of the inferior thyroid artery were divided between small Ligaclips with the Harmonic scalpel.  Inferior and middle venous tributaries were divided between Ligaclips.  Both the  superior and inferior parathyroid glands were identified and preserved on their vascular pedicles.  The recurrent laryngeal nerve was identified and preserved along its course.  The ligament of Gwenlyn Found was released with the electrocautery and the gland was mobilized onto the anterior trachea. Isthmus was mobilized across the midline.  There was minimal pyramidal lobe present and it was mobilized.  Dry pack was placed in the left neck.  Next, the right thyroid lobe was gently mobilized with blunt dissection staying on the capsule of the gland.  Right thyroid lobe was noted to have multiple small nodules.  Superior pole vessels were dissected out and divided between small and medium Ligaclips with the Harmonic scalpel.  Superior  parathyroid gland was identified and preserved.  I did not definitely see an inferior right parathyroid gland.  Inferior and middle venous tributaries were divided between medium Ligaclips with the Harmonic scalpel.  The right thyroid lobe was rolled anteriorly and the branches of the inferior thyroid artery divided between small Ligaclips.  The right recurrent laryngeal nerve was identified and preserved along its course.  The ligament of Gwenlyn Found was released with the electrocautery.  The right thyroid lobe was mobilized onto the anterior trachea and the remainder of the thyroid was dissected off the anterior trachea and the thyroid was completely excised.  A suture was used to mark the upper pole of the left lobe. The entire thyroid gland was submitted to pathology for review.  The neck was irrigated with warm saline.  Fibrillar was placed throughout the operative field.  Strap muscles were reapproximated in the midline with interrupted 3-0 Vicryl sutures.  Platysma was closed with interrupted 3-0 Vicryl sutures.  Skin was closed with a running 4-0 Monocryl subcuticular suture.  Wound was washed and dried and benzoin and steri-strips were applied.  Dry gauze dressing was placed.  The  procedure was well tolerated, without any apparent complications and she was awakened from anesthesia and brought to the recovery room in satisfactory condition.

## 2016-07-06 NOTE — H&P (Signed)
Annette Gross DOB: [birth year] Married / Language: English / Race: White Female  History of Present Illness  The patient is a 76 year old female.   Note:She was referred by Dr. Lavone Orn for consultation regarding a left thyroid nodule with atypical follicular cells suspicious for malignancy. This all started out with her having some cervical dysphasia at times. An ultrasound demonstrated multiple small nodules bilaterally and a 1.2 cm nodule in the left upper pole. Fine needle aspiration biopsy was recommended. This was done with the above pathology results. She reports that her TSH level is normal. There is no family history of thyroid cancer. She reports that her sister has hypothyroidism. She reports increased fatigue and having her hair fall out at times. Her husband is here with her.  Her past medical history is notable for type 2 diabetes, depression, peripheral neuropathy, iron deficiency anemia, hyperlipidemia, gastroparesis, bronchitis.  Past Surgical History  Colon Polyp Removal - Colonoscopy Foot Surgery Left. Gallbladder Surgery - Laparoscopic Hemorrhoidectomy Nissen Fundoplication Shoulder Surgery Right. Tonsillectomy Ventral / Umbilical Hernia Surgery Bilateral.  Allergies  Sulfa Antibiotics Codeine and Related IBUPROFEN ASPIRIN Lipitor *ANTIHYPERLIPIDEMICS* Lexapro *ANTIDEPRESSANTS* Milk-related Compounds Beef/Potatoes/Peas *DIETARY PRODUCTS/DIETARY MANAGEMENT PRODUCTS* Trees Shrimp Mold Spores Dust Cockroaches  Prior to Admission medications   Medication Sig Start Date End Date Taking? Authorizing Provider  acetaminophen (TYLENOL) 500 MG tablet Take 1,000 mg by mouth 2 (two) times daily.   Yes Historical Provider, MD  albuterol (PROVENTIL HFA;VENTOLIN HFA) 108 (90 Base) MCG/ACT inhaler Inhale 2 puffs into the lungs every 4 (four) hours as needed for wheezing or shortness of breath.   Yes Historical Provider, MD   Cholecalciferol (VITAMIN D3) 2000 units TABS Take 4,000 Units by mouth daily.   Yes Historical Provider, MD  docusate sodium (COLACE) 100 MG capsule Take 100 mg by mouth 2 (two) times daily as needed for constipation.   Yes Historical Provider, MD  DULoxetine (CYMBALTA) 30 MG capsule Take 30 mg by mouth daily. 08/09/15  Yes Historical Provider, MD  EPINEPHrine 0.3 mg/0.3 mL IJ SOAJ injection Inject 0.3 mg into the muscle as needed.   Yes Historical Provider, MD  fluticasone (FLONASE) 50 MCG/ACT nasal spray Place 2 sprays into the nose daily.    Yes Historical Provider, MD  levocetirizine (XYZAL) 5 MG tablet Take 5 mg by mouth every evening. 08/09/15  Yes Historical Provider, MD  levothyroxine (SYNTHROID, LEVOTHROID) 25 MCG tablet Take 25 mcg by mouth daily before breakfast.   Yes Historical Provider, MD  montelukast (SINGULAIR) 10 MG tablet Take 10 mg by mouth every evening. 06/07/15  Yes Historical Provider, MD  Multiple Vitamin (MULTIVITAMIN) tablet Take 1 tablet by mouth daily.     Yes Historical Provider, MD  Multiple Vitamins-Minerals (MULTIVITAMIN ADULT) CHEW Chew 2 each by mouth daily.   Yes Historical Provider, MD  omeprazole (PRILOSEC) 40 MG capsule Take 40 mg by mouth daily.     Yes Historical Provider, MD  Propylene Glycol (SYSTANE BALANCE OP) Place 1 drop into both eyes daily as needed (dry eyes).    Historical Provider, MD     Social History  Alcohol use Occasional alcohol use. Caffeine use Carbonated beverages, Coffee. No drug use Tobacco use Never smoker.  Family History  Cancer Family Members In General. Depression Father. Heart Disease Father. Hypertension Father.  Other Problems (April Staton, CMA; 05/27/2016 10:20 AM) Anxiety Disorder Arthritis Asthma Back Pain Bladder Problems Cholelithiasis Depression Diabetes Mellitus Diverticulosis Gastroesophageal Reflux Disease Hemorrhoids High blood pressure Hypercholesterolemia Oophorectomy  Bilateral. Thyroid Cancer Thyroid Disease Transfusion history Umbilical Hernia Repair   Review of Systems:  No recent fever, chills, nausea, vomiting, diarrhea.     Physical Exam   The physical exam findings are as follows: Note:GENERAL APPEARANCE: Obese female in NAD. Pleasant and cooperative.  EARS, NOSE, MOUTH THROAT: [place]/AT external ears: no lesions or deformities external nose: no lesions or deformities hearing: grossly normal lips: moist, no deformities EYES external: conjunctiva, lids, sclerae normal pupils: equal, round glasses: no  NECK: Supple, no obvious mass or thyroid mass/enlargement, no trachea deviation  CV ascultation: RRR, no murmur extremity edema: no extremity varicosities: no  RESP auscultation: breath sounds equal and clear respiratory effort: normal  GASTROINTESTINAL abdomen: Soft, non-tender, non-distended, no masses liver and spleen: not enlarged. hernia: small epigastric scar: multiple MUSCULOSKELETAL station and gait: normal digits/nails: no clubbing or cyanosis instability: none   LYMPHATIC: No palpable cervical, supraclavicular adenopathy.  SKIN NEUROLOGIC speech: normal, no hoarseness  PSYCHIATRIC alertness and orientation: normal mood/affect/behavior: normal judgement and insight: normal    Assessment & Plan   LEFT THYROID NODULE (E04.1) Impression: Bethesda level V. We discussed that this implies that there is a 60-75% chance of malignancy. Has multiple small nodules on the right side.  Plan: We discussed partial thyroidectomy and if malignancy is confirmed, going back for completion thyroidectomy versus total thyroidectomy initially. She would prefer proceeding with total thyroidectomy and I am in agreement with this. The procedure and risks of thyroid surgery have been discussed. Risks include but not limited to bleeding, infection, wound healing problems, presence of scar, anesthesia, injury  to recurrent laryngeal nerve and permanent hoarseness, permanent hypocalcemia, voice changes, dysphagia. She seems to understand and agrees with the plan.  Jackolyn Confer, M.D.

## 2016-07-06 NOTE — Discharge Instructions (Signed)
[facility]      [facility] °[phone] ° °THYROID/ PARATHYROID SURGERY: POST OP INSTRUCTIONS ° °Always review your discharge instruction sheet given to you by the facility where your surgery was performed. ° °IF YOU HAVE DISABILITY OR FAMILY LEAVE FORMS, YOU MUST BRING THEM TO THE OFFICE FOR PROCESSING.  PLEASE DO NOT GIVE THEM TO YOUR DOCTOR. ° °1. A prescription for pain medication may be given to you upon discharge.  Take your pain medication as prescribed, if needed.  If narcotic pain medicine is not needed, then you may take acetaminophen (Tylenol) or ibuprofen (Advil) as needed. °2. Take your usually prescribed medications unless otherwise directed. °3. If you need a refill on your pain medication, please contact your pharmacy. They will contact our office to request authorization.  Prescriptions will not be filled after 5pm or on week-ends. °4. You should follow a light diet the first 24 hours after arrival home, such as soup and crackers, etc.  Be sure to include lots of fluids daily.  Resume your normal diet the day after surgery. °5. Most patients will experience some swelling and bruising on the chest and neck area.  Ice packs will help.  Swelling and bruising can take several days to resolve.  °6. It is common to experience some constipation if taking pain medication after surgery.  Increasing fluid intake and taking a stool softener will usually help or prevent this problem from occurring.  A mild laxative (Milk of Magnesia or Miralax) should be taken according to package directions if there are no bowel movements after 48 hours. °7. Unless discharge instructions indicate otherwise, you may remove your bandages 24-48 hours after surgery, and you may shower at that time.  You may have steri-strips (small skin tapes) in place directly over the incision.  These strips should be left on the skin for 7-10 days.  If your surgeon used skin glue on the incision, you may shower in 24 hours.  The  glue will flake off over the next 2-3 weeks.  Any sutures or staples will be removed at the office during your follow-up visit. °8. ACTIVITIES:  You may resume regular (light) daily activities beginning the next day--such as daily self-care, walking, climbing stairs--gradually increasing activities as tolerated.  You may have sexual intercourse when it is comfortable.  Refrain from any heavy lifting or straining until approved by your doctor. °a. You may drive when you no longer are taking prescription pain medication, you can comfortably wear a seatbelt, and you can safely maneuver your car and apply brakes °b. RETURN TO WORK:  __________________________________________________________ °9. You should see your doctor in the office for a follow-up appointment approximately two weeks after your surgery.  Make sure that you call for this appointment within a day or two after you arrive home to insure a convenient appointment time. °10. OTHER INSTRUCTIONS: ____________________________________________________________________________ _________________________________________________________________________________________________________________ °_________________________________________________________________________________________________________________ ° ° °WHEN TO CALL YOUR DOCTOR: °1. Fever over 101.0 °2. Inability to urinate °3. Nausea and/or vomiting °4. Extreme swelling or bruising °5. Continued bleeding from incision. °6. Increased pain, redness, or drainage from the incision. °7. Difficulty swallowing or breathing °8. Muscle cramping or spasms. °9. Numbness or tingling in hands or feet or around lips. ° °The clinic staff is available to answer your questions during regular business hours.  Please don’t hesitate to call and ask to speak to one of the nurses if you have concerns. ° °For further questions, please visit [email or web address] °

## 2016-07-06 NOTE — Anesthesia Postprocedure Evaluation (Signed)
Anesthesia Post Note  Patient: Buck Mam  Procedure(s) Performed: Procedure(s) (LRB): TOTAL THYROIDECTOMY (N/A)  Patient location during evaluation: PACU Anesthesia Type: General Level of consciousness: awake and alert Pain management: pain level controlled Vital Signs Assessment: post-procedure vital signs reviewed and stable Respiratory status: spontaneous breathing, nonlabored ventilation, respiratory function stable and patient connected to nasal cannula oxygen Cardiovascular status: blood pressure returned to baseline and stable Postop Assessment: no signs of nausea or vomiting Anesthetic complications: no       Last Vitals:  Vitals:   07/06/16 1322 07/06/16 1335  BP: (!) 163/92 (!) 182/74  Pulse: 94 (!) 106  Resp: (!) 25 15  Temp: 36.5 C     Last Pain:  Vitals:   07/06/16 1350  TempSrc:   PainSc: 4                  Montez Hageman

## 2016-07-06 NOTE — Anesthesia Preprocedure Evaluation (Signed)
Anesthesia Evaluation  Patient identified by MRN, date of birth, ID band Patient awake    Reviewed: Allergy & Precautions, NPO status , Patient's Chart, lab work & pertinent test results  History of Anesthesia Complications (+) PONV  Airway Mallampati: II  TM Distance: >3 FB Neck ROM: Full    Dental no notable dental hx.    Pulmonary asthma ,    Pulmonary exam normal breath sounds clear to auscultation       Cardiovascular hypertension, Normal cardiovascular exam Rhythm:Regular Rate:Normal     Neuro/Psych negative neurological ROS  negative psych ROS   GI/Hepatic negative GI ROS, Neg liver ROS,   Endo/Other  diabetes  Renal/GU negative Renal ROS  negative genitourinary   Musculoskeletal negative musculoskeletal ROS (+) Fibromyalgia -  Abdominal   Peds negative pediatric ROS (+)  Hematology negative hematology ROS (+)   Anesthesia Other Findings   Reproductive/Obstetrics negative OB ROS                             Anesthesia Physical Anesthesia Plan  ASA: II  Anesthesia Plan: General   Post-op Pain Management:    Induction: Intravenous  Airway Management Planned: Oral ETT  Additional Equipment:   Intra-op Plan:   Post-operative Plan: Extubation in OR  Informed Consent: I have reviewed the patients History and Physical, chart, labs and discussed the procedure including the risks, benefits and alternatives for the proposed anesthesia with the patient or authorized representative who has indicated his/her understanding and acceptance.   Dental advisory given  Plan Discussed with: CRNA  Anesthesia Plan Comments:         Anesthesia Quick Evaluation

## 2016-07-06 NOTE — Interval H&P Note (Signed)
History and Physical Interval Note:  07/06/2016 11:18 AM  Annette Gross  has presented today for surgery, with the diagnosis of atypical left thyroid nodule-Bethesda V  The various methods of treatment have been discussed with the patient and family. After consideration of risks, benefits and other options for treatment, the patient has consented to  Procedure(s): TOTAL THYROIDECTOMY (N/A) as a surgical intervention .  The patient's history has been reviewed, patient examined, no change in status, stable for surgery.  I have reviewed the patient's chart and labs.  Questions were answered to the patient's satisfaction.     Vaniah Chambers Lenna Sciara

## 2016-07-06 NOTE — Anesthesia Preprocedure Evaluation (Signed)
Anesthesia Evaluation    History of Anesthesia Complications (+) PONV  Airway        Dental   Pulmonary asthma ,           Cardiovascular hypertension, Pt. on medications      Neuro/Psych    GI/Hepatic   Endo/Other  diabetes  Renal/GU      Musculoskeletal  (+) Fibromyalgia -  Abdominal   Peds  Hematology   Anesthesia Other Findings   Reproductive/Obstetrics                             Anesthesia Physical Anesthesia Plan Anesthesia Quick Evaluation

## 2016-07-06 NOTE — Progress Notes (Signed)
Dr Ermalene Postin notified of BP 193/79 consistently having systolic ranging from 0000000. Will give 10mg  labetalol IV as ordered and continue to monitor.

## 2016-07-06 NOTE — Anesthesia Procedure Notes (Cosign Needed)
Procedure Name: Intubation Date/Time: 07/06/2016 11:34 AM Performed by: CARIGNAN, PETER Pre-anesthesia Checklist: Patient identified, Emergency Drugs available, Suction available and Patient being monitored Patient Re-evaluated:Patient Re-evaluated prior to inductionOxygen Delivery Method: Circle system utilized Preoxygenation: Pre-oxygenation with 100% oxygen Intubation Type: IV induction Ventilation: Mask ventilation without difficulty Laryngoscope Size: Miller and 2 Grade View: Grade I Tube type: Oral Tube size: 7.0 mm Number of attempts: 1 Airway Equipment and Method: Patient positioned with wedge pillow,  Stylet and LTA kit utilized Placement Confirmation: ETT inserted through vocal cords under direct vision and positive ETCO2 Secured at: 19 cm Tube secured with: Tape Dental Injury: Teeth and Oropharynx as per pre-operative assessment        

## 2016-07-07 ENCOUNTER — Encounter (HOSPITAL_COMMUNITY): Payer: Self-pay | Admitting: General Surgery

## 2016-07-07 DIAGNOSIS — M4800 Spinal stenosis, site unspecified: Secondary | ICD-10-CM | POA: Diagnosis present

## 2016-07-07 DIAGNOSIS — E041 Nontoxic single thyroid nodule: Secondary | ICD-10-CM | POA: Diagnosis not present

## 2016-07-07 DIAGNOSIS — J189 Pneumonia, unspecified organism: Secondary | ICD-10-CM | POA: Diagnosis not present

## 2016-07-07 DIAGNOSIS — J329 Chronic sinusitis, unspecified: Secondary | ICD-10-CM | POA: Diagnosis present

## 2016-07-07 DIAGNOSIS — E1142 Type 2 diabetes mellitus with diabetic polyneuropathy: Secondary | ICD-10-CM | POA: Diagnosis present

## 2016-07-07 DIAGNOSIS — E039 Hypothyroidism, unspecified: Secondary | ICD-10-CM | POA: Diagnosis not present

## 2016-07-07 DIAGNOSIS — C73 Malignant neoplasm of thyroid gland: Secondary | ICD-10-CM | POA: Diagnosis present

## 2016-07-07 DIAGNOSIS — E1143 Type 2 diabetes mellitus with diabetic autonomic (poly)neuropathy: Secondary | ICD-10-CM | POA: Diagnosis present

## 2016-07-07 DIAGNOSIS — E876 Hypokalemia: Secondary | ICD-10-CM | POA: Diagnosis not present

## 2016-07-07 DIAGNOSIS — J45909 Unspecified asthma, uncomplicated: Secondary | ICD-10-CM | POA: Diagnosis present

## 2016-07-07 DIAGNOSIS — E118 Type 2 diabetes mellitus with unspecified complications: Secondary | ICD-10-CM | POA: Diagnosis not present

## 2016-07-07 DIAGNOSIS — D682 Hereditary deficiency of other clotting factors: Secondary | ICD-10-CM | POA: Diagnosis present

## 2016-07-07 DIAGNOSIS — K9189 Other postprocedural complications and disorders of digestive system: Secondary | ICD-10-CM | POA: Diagnosis not present

## 2016-07-07 DIAGNOSIS — K3184 Gastroparesis: Secondary | ICD-10-CM | POA: Diagnosis present

## 2016-07-07 DIAGNOSIS — F329 Major depressive disorder, single episode, unspecified: Secondary | ICD-10-CM | POA: Diagnosis present

## 2016-07-07 DIAGNOSIS — Y95 Nosocomial condition: Secondary | ICD-10-CM | POA: Diagnosis not present

## 2016-07-07 DIAGNOSIS — R0981 Nasal congestion: Secondary | ICD-10-CM | POA: Diagnosis present

## 2016-07-07 DIAGNOSIS — E042 Nontoxic multinodular goiter: Secondary | ICD-10-CM | POA: Diagnosis present

## 2016-07-07 DIAGNOSIS — E871 Hypo-osmolality and hyponatremia: Secondary | ICD-10-CM | POA: Diagnosis present

## 2016-07-07 DIAGNOSIS — E46 Unspecified protein-calorie malnutrition: Secondary | ICD-10-CM | POA: Diagnosis present

## 2016-07-07 DIAGNOSIS — K219 Gastro-esophageal reflux disease without esophagitis: Secondary | ICD-10-CM | POA: Diagnosis present

## 2016-07-07 DIAGNOSIS — I10 Essential (primary) hypertension: Secondary | ICD-10-CM | POA: Diagnosis present

## 2016-07-07 DIAGNOSIS — M479 Spondylosis, unspecified: Secondary | ICD-10-CM | POA: Diagnosis present

## 2016-07-07 DIAGNOSIS — M797 Fibromyalgia: Secondary | ICD-10-CM | POA: Diagnosis present

## 2016-07-07 DIAGNOSIS — E785 Hyperlipidemia, unspecified: Secondary | ICD-10-CM | POA: Diagnosis present

## 2016-07-07 DIAGNOSIS — F419 Anxiety disorder, unspecified: Secondary | ICD-10-CM | POA: Diagnosis present

## 2016-07-07 LAB — BASIC METABOLIC PANEL
ANION GAP: 11 (ref 5–15)
Anion gap: 10 (ref 5–15)
BUN: 8 mg/dL (ref 6–20)
BUN: 8 mg/dL (ref 6–20)
CHLORIDE: 102 mmol/L (ref 101–111)
CO2: 27 mmol/L (ref 22–32)
CO2: 29 mmol/L (ref 22–32)
CREATININE: 0.67 mg/dL (ref 0.44–1.00)
Calcium: 7.6 mg/dL — ABNORMAL LOW (ref 8.9–10.3)
Calcium: 8.7 mg/dL — ABNORMAL LOW (ref 8.9–10.3)
Chloride: 97 mmol/L — ABNORMAL LOW (ref 101–111)
Creatinine, Ser: 0.62 mg/dL (ref 0.44–1.00)
GFR calc Af Amer: >60 mL/min (ref >60)
GFR calc non Af Amer: >60 mL/min (ref >60)
GLUCOSE: 229 mg/dL — AB (ref 65–99)
Glucose, Bld: 80 mg/dL (ref 65–99)
POTASSIUM: 3.8 mmol/L (ref 3.5–5.1)
Potassium: 3.4 mmol/L — ABNORMAL LOW (ref 3.5–5.1)
SODIUM: 141 mmol/L (ref 135–145)
Sodium: 135 mmol/L (ref 135–145)

## 2016-07-07 LAB — GLUCOSE, CAPILLARY
GLUCOSE-CAPILLARY: 213 mg/dL — AB (ref 65–99)
GLUCOSE-CAPILLARY: 91 mg/dL (ref 65–99)
Glucose-Capillary: 232 mg/dL — ABNORMAL HIGH (ref 65–99)
Glucose-Capillary: 179 mg/dL — ABNORMAL HIGH (ref 65–99)

## 2016-07-07 MED ORDER — ENOXAPARIN SODIUM 40 MG/0.4ML ~~LOC~~ SOLN
40.0000 mg | SUBCUTANEOUS | Status: DC
  Administered 2016-07-07 – 2016-07-10 (×4): 40 mg via SUBCUTANEOUS
  Filled 2016-07-07 (×4): qty 0.4

## 2016-07-07 MED ORDER — SODIUM CHLORIDE 0.9 % IV SOLN
2.0000 g | Freq: Once | INTRAVENOUS | Status: AC
  Administered 2016-07-07: 2 g via INTRAVENOUS
  Filled 2016-07-07: qty 20

## 2016-07-07 MED ORDER — CALCIUM CARBONATE 1250 (500 CA) MG PO TABS
1000.0000 mg | ORAL_TABLET | Freq: Three times a day (TID) | ORAL | Status: DC
  Administered 2016-07-07 (×2): 1000 mg via ORAL
  Filled 2016-07-07 (×2): qty 1

## 2016-07-07 MED ORDER — PROMETHAZINE HCL 25 MG/ML IJ SOLN
6.2500 mg | Freq: Four times a day (QID) | INTRAMUSCULAR | Status: DC | PRN
  Administered 2016-07-07 – 2016-07-08 (×4): 6.25 mg via INTRAVENOUS
  Filled 2016-07-07 (×4): qty 1

## 2016-07-07 MED ORDER — POTASSIUM CHLORIDE IN NACL 20-0.9 MEQ/L-% IV SOLN
INTRAVENOUS | Status: DC
  Administered 2016-07-07 – 2016-07-08 (×2): via INTRAVENOUS
  Filled 2016-07-07 (×2): qty 1000

## 2016-07-07 MED ORDER — CALCIUM CARBONATE ANTACID 500 MG PO CHEW
800.0000 mg | CHEWABLE_TABLET | Freq: Every day | ORAL | Status: DC
  Administered 2016-07-07: 800 mg via ORAL
  Filled 2016-07-07: qty 4

## 2016-07-07 MED ORDER — INSULIN ASPART 100 UNIT/ML ~~LOC~~ SOLN
0.0000 [IU] | Freq: Three times a day (TID) | SUBCUTANEOUS | Status: DC
Start: 2016-07-07 — End: 2016-07-10
  Administered 2016-07-07 – 2016-07-08 (×2): 5 [IU] via SUBCUTANEOUS
  Administered 2016-07-08 – 2016-07-09 (×4): 3 [IU] via SUBCUTANEOUS
  Administered 2016-07-10: 2 [IU] via SUBCUTANEOUS
  Administered 2016-07-10: 3 [IU] via SUBCUTANEOUS

## 2016-07-07 NOTE — Progress Notes (Addendum)
Assessment Principal Problem:   Highly dysplastic (Bethesda V) left thyroid nodule s/p total thyroidectomy   Post op hypocalcemia   Asthma-required Albuterol nebulizer treatment   HTN-BP better this AM.   DM-glucose > 200.   DVT prophylaxis-SCDs, no evidence of postop bleeding so will start Lovenox today.   Plan:  Increase calcium supplementation and recheck calcium level this evening. Start SSI.  Albuterol treatments as needed.  Add low dose Phenergan for breakthrough nausea.  Mobilize.  Not ready for discharge.    LOS: 0 days     1 Day Post-Op  Subjective: Having postop n/v.  Wheezing. She reports that nebulizer treatments help. Did not sleep well.   Objective: Vital signs in last 24 hours: Temp:  [97.5 F (36.4 C)-98 F (36.7 C)] 97.7 F (36.5 C) (03/06 0539) Pulse Rate:  [72-106] 89 (03/06 0539) Resp:  [14-25] 16 (03/06 0539) BP: (145-193)/(57-93) 145/68 (03/06 0539) SpO2:  [92 %-98 %] 98 % (03/06 0539)    Intake/Output from previous day: 03/05 0701 - 03/06 0700 In: 2382.5 [P.O.:330; I.V.:2052.5] Out: 270 [Urine:250; Blood:20] Intake/Output this shift: No intake/output data recorded.  PE: General- In NAD. Not hoarse. No Chvostek's sign. Neck-incision is clean and intact, no significant swelling Lungs-bilateral wheezes; some stridor when she tries to breath fast, no stridor when she breaths normally. No increased WOB.   Lab Results:  No results for input(s): WBC, HGB, HCT, PLT in the last 72 hours. BMET  Recent Labs  07/06/16 1834 07/07/16 0737  NA 131* 135  K 3.6 3.8  CL 94* 97*  CO2 24 27  GLUCOSE 250* 229*  BUN 8 8  CREATININE 0.59 0.62  CALCIUM 8.4* 7.6*   PT/INR No results for input(s): LABPROT, INR in the last 72 hours. Comprehensive Metabolic Panel:    Component Value Date/Time   NA 135 07/07/2016 0737   NA 131 (L) 07/06/2016 1834   K 3.8 07/07/2016 0737   K 3.6 07/06/2016 1834   CL 97 (L) 07/07/2016 0737   CL 94 (L) 07/06/2016 1834   CO2 27 07/07/2016 0737   CO2 24 07/06/2016 1834   BUN 8 07/07/2016 0737   BUN 8 07/06/2016 1834   CREATININE 0.62 07/07/2016 0737   CREATININE 0.59 07/06/2016 1834   GLUCOSE 229 (H) 07/07/2016 0737   GLUCOSE 250 (H) 07/06/2016 1834   CALCIUM 7.6 (L) 07/07/2016 0737   CALCIUM 8.4 (L) 07/06/2016 1834   AST 21 06/29/2016 1151   AST 36 07/19/2007 0415   ALT 19 06/29/2016 1151   ALT 103 (H) 07/19/2007 0415   ALKPHOS 110 06/29/2016 1151   ALKPHOS 145 (H) 07/19/2007 0415   BILITOT 0.6 06/29/2016 1151   BILITOT 0.5 07/19/2007 0415   PROT 6.7 06/29/2016 1151   PROT 5.1 (L) 07/19/2007 0415   ALBUMIN 3.9 06/29/2016 1151   ALBUMIN 2.4 (L) 07/19/2007 0415     Studies/Results: No results found.  Anti-infectives: Anti-infectives    Start     Dose/Rate Route Frequency Ordered Stop   07/06/16 0919  ceFAZolin (ANCEF) 2-4 GM/100ML-% IVPB    Comments:  Rosenberger, [name]: cabinet override      07/06/16 0919 07/06/16 1141   07/06/16 0914  ceFAZolin (ANCEF) IVPB 2g/100 mL premix     2 g 200 mL/hr over 30 Minutes Intravenous On call to O.R. 07/06/16 0914 07/06/16 1141       Annette Gross J 07/07/2016

## 2016-07-07 NOTE — Progress Notes (Signed)
RN called for prn tx for pt. Noted pt to have intermittent upper airway inspiratory noise (stridor ?).  Pt denies SOB, no distress noted, no increased WOB noted.  Recommended RN follow up w/ MD.

## 2016-07-08 ENCOUNTER — Inpatient Hospital Stay (HOSPITAL_COMMUNITY): Payer: Medicare Other

## 2016-07-08 DIAGNOSIS — E876 Hypokalemia: Secondary | ICD-10-CM

## 2016-07-08 DIAGNOSIS — E118 Type 2 diabetes mellitus with unspecified complications: Secondary | ICD-10-CM

## 2016-07-08 DIAGNOSIS — J189 Pneumonia, unspecified organism: Secondary | ICD-10-CM

## 2016-07-08 DIAGNOSIS — I10 Essential (primary) hypertension: Secondary | ICD-10-CM

## 2016-07-08 DIAGNOSIS — E041 Nontoxic single thyroid nodule: Secondary | ICD-10-CM

## 2016-07-08 LAB — GLUCOSE, CAPILLARY
GLUCOSE-CAPILLARY: 161 mg/dL — AB (ref 65–99)
Glucose-Capillary: 185 mg/dL — ABNORMAL HIGH (ref 65–99)
Glucose-Capillary: 202 mg/dL — ABNORMAL HIGH (ref 65–99)
Glucose-Capillary: 113 mg/dL — ABNORMAL HIGH (ref 65–99)

## 2016-07-08 LAB — BASIC METABOLIC PANEL
ANION GAP: 10 (ref 5–15)
ANION GAP: 11 (ref 5–15)
BUN: 8 mg/dL (ref 6–20)
BUN: 6 mg/dL (ref 6–20)
CHLORIDE: 98 mmol/L — AB (ref 101–111)
CHLORIDE: 93 mmol/L — AB (ref 101–111)
CO2: 28 mmol/L (ref 22–32)
CO2: 29 mmol/L (ref 22–32)
Calcium: 7.7 mg/dL — ABNORMAL LOW (ref 8.9–10.3)
Calcium: 7.9 mg/dL — ABNORMAL LOW (ref 8.9–10.3)
Creatinine, Ser: 0.6 mg/dL (ref 0.44–1.00)
Creatinine, Ser: 0.52 mg/dL (ref 0.44–1.00)
GFR calc non Af Amer: >60 mL/min (ref >60)
GFR calc non Af Amer: >60 mL/min (ref >60)
Glucose, Bld: 181 mg/dL — ABNORMAL HIGH (ref 65–99)
Glucose, Bld: 174 mg/dL — ABNORMAL HIGH (ref 65–99)
POTASSIUM: 3.7 mmol/L (ref 3.5–5.1)
POTASSIUM: 3.3 mmol/L — AB (ref 3.5–5.1)
SODIUM: 136 mmol/L (ref 135–145)
SODIUM: 133 mmol/L — AB (ref 135–145)

## 2016-07-08 LAB — STREP PNEUMONIAE URINARY ANTIGEN: Strep Pneumo Urinary Antigen: NEGATIVE

## 2016-07-08 LAB — MAGNESIUM: MAGNESIUM: 1.6 mg/dL — AB (ref 1.7–2.4)

## 2016-07-08 MED ORDER — DIPHENHYDRAMINE HCL 25 MG PO CAPS
25.0000 mg | ORAL_CAPSULE | Freq: Every evening | ORAL | Status: DC | PRN
  Administered 2016-07-09: 50 mg via ORAL
  Filled 2016-07-08: qty 2

## 2016-07-08 MED ORDER — POLYETHYLENE GLYCOL 3350 17 G PO PACK
17.0000 g | PACK | Freq: Every day | ORAL | Status: DC
  Administered 2016-07-08 – 2016-07-09 (×2): 17 g via ORAL
  Filled 2016-07-08 (×2): qty 1

## 2016-07-08 MED ORDER — LABETALOL HCL 5 MG/ML IV SOLN
20.0000 mg | INTRAVENOUS | Status: DC | PRN
  Administered 2016-07-08: 20 mg via INTRAVENOUS
  Filled 2016-07-08 (×2): qty 4

## 2016-07-08 MED ORDER — METOCLOPRAMIDE HCL 5 MG/ML IJ SOLN
10.0000 mg | Freq: Four times a day (QID) | INTRAMUSCULAR | Status: DC | PRN
  Administered 2016-07-08 – 2016-07-09 (×2): 10 mg via INTRAVENOUS
  Filled 2016-07-08 (×2): qty 2

## 2016-07-08 MED ORDER — MONTELUKAST SODIUM 10 MG PO TABS
10.0000 mg | ORAL_TABLET | Freq: Every evening | ORAL | Status: DC
  Administered 2016-07-08 – 2016-07-09 (×2): 10 mg via ORAL
  Filled 2016-07-08 (×2): qty 1

## 2016-07-08 MED ORDER — VANCOMYCIN HCL IN DEXTROSE 750-5 MG/150ML-% IV SOLN
750.0000 mg | Freq: Two times a day (BID) | INTRAVENOUS | Status: DC
  Administered 2016-07-08 – 2016-07-10 (×3): 750 mg via INTRAVENOUS
  Filled 2016-07-08 (×6): qty 150

## 2016-07-08 MED ORDER — ALBUTEROL SULFATE (2.5 MG/3ML) 0.083% IN NEBU
2.5000 mg | INHALATION_SOLUTION | Freq: Four times a day (QID) | RESPIRATORY_TRACT | Status: DC
  Administered 2016-07-08: 2.5 mg via RESPIRATORY_TRACT
  Filled 2016-07-08: qty 3

## 2016-07-08 MED ORDER — CALCITRIOL 0.25 MCG PO CAPS
0.2500 ug | ORAL_CAPSULE | Freq: Every day | ORAL | Status: DC
  Administered 2016-07-08 – 2016-07-10 (×3): 0.25 ug via ORAL
  Filled 2016-07-08 (×3): qty 1

## 2016-07-08 MED ORDER — CALCIUM CARBONATE 1250 (500 CA) MG PO TABS
1000.0000 mg | ORAL_TABLET | Freq: Four times a day (QID) | ORAL | Status: DC
  Administered 2016-07-08 (×2): 1000 mg via ORAL
  Filled 2016-07-08 (×4): qty 1

## 2016-07-08 MED ORDER — SODIUM CHLORIDE 0.9 % IV SOLN
1.0000 g | Freq: Once | INTRAVENOUS | Status: AC
  Administered 2016-07-08: 1 g via INTRAVENOUS
  Filled 2016-07-08: qty 10

## 2016-07-08 MED ORDER — BUTALBITAL-APAP-CAFFEINE 50-325-40 MG PO TABS
1.0000 | ORAL_TABLET | ORAL | Status: DC | PRN
  Administered 2016-07-09: 1 via ORAL
  Filled 2016-07-08: qty 1

## 2016-07-08 MED ORDER — FLUTICASONE PROPIONATE 50 MCG/ACT NA SUSP
2.0000 | Freq: Every day | NASAL | Status: DC
  Administered 2016-07-08 – 2016-07-10 (×3): 2 via NASAL
  Filled 2016-07-08: qty 16

## 2016-07-08 MED ORDER — ALBUTEROL SULFATE (2.5 MG/3ML) 0.083% IN NEBU
3.0000 mL | INHALATION_SOLUTION | RESPIRATORY_TRACT | Status: DC | PRN
  Administered 2016-07-10: 3 mL via RESPIRATORY_TRACT
  Filled 2016-07-08: qty 3

## 2016-07-08 MED ORDER — POTASSIUM CHLORIDE IN NACL 40-0.9 MEQ/L-% IV SOLN
INTRAVENOUS | Status: DC
  Administered 2016-07-08: 75 mL/h via INTRAVENOUS
  Filled 2016-07-08 (×2): qty 1000

## 2016-07-08 MED ORDER — LISINOPRIL 10 MG PO TABS
10.0000 mg | ORAL_TABLET | Freq: Every day | ORAL | Status: DC
  Administered 2016-07-09 – 2016-07-10 (×2): 10 mg via ORAL
  Filled 2016-07-08 (×2): qty 1

## 2016-07-08 MED ORDER — MAGNESIUM SULFATE 2 GM/50ML IV SOLN
2.0000 g | Freq: Once | INTRAVENOUS | Status: AC
  Administered 2016-07-08: 2 g via INTRAVENOUS
  Filled 2016-07-08: qty 50

## 2016-07-08 MED ORDER — DEXTROSE 5 % IV SOLN
1.0000 g | Freq: Three times a day (TID) | INTRAVENOUS | Status: DC
  Administered 2016-07-08 – 2016-07-10 (×6): 1 g via INTRAVENOUS
  Filled 2016-07-08 (×8): qty 1

## 2016-07-08 MED ORDER — POTASSIUM CHLORIDE CRYS ER 20 MEQ PO TBCR: 40.0000 meq | EXTENDED_RELEASE_TABLET | Freq: Once | ORAL | Status: DC

## 2016-07-08 NOTE — Progress Notes (Signed)
Spoke with RN.  Still having nausea and some vomiting despite Zofran and Phenergan.  Also, having some shortness of breath.  Will check CXR to see if she has aspirated.

## 2016-07-08 NOTE — Progress Notes (Signed)
Pharmacy Antibiotic Note  Annette Gross is a 76 y.o. female admitted on 07/06/2016 for total thyroidectomy. CXR today concerning for RLL pneumonia.  Pharmacy has been consulted for Vancomycin dosing x 8 days for pneumonia.  Also starting Cefepime 1 gm IV q8hrs x 8 days. Dose appropriate.  Plan:   Vancomycin 750 mg IV q12hrs x 8 days.   Target Vancomycin troughs 15-20 mcg/ml.   Cefepime 1 gm IV q8hrs x 8 days as ordered.   Will follow renal function, culture data, progress.   Consider Vanc trough level at steady-state.  Height: 5' (152.4 cm) Weight: 182 lb (82.6 kg) IBW/kg (Calculated) : 45.5  Temp (24hrs), Avg:99.1 F (37.3 C), Min:98.6 F (37 C), Max:99.6 F (37.6 C)   Recent Labs Lab 07/06/16 1834 07/07/16 0737 07/07/16 1623 07/08/16 0508 07/08/16 1600  CREATININE 0.59 0.62 0.67 0.60 0.52    Estimated Creatinine Clearance: 57.8 mL/min (by C-G formula based on SCr of 0.52 mg/dL).    Allergies  Allergen Reactions  . Ciprofloxacin Other (See Comments)    Tendonitis  . Iohexol Anaphylaxis  . Ivp Dye [Iodinated Diagnostic Agents] Anaphylaxis  . Kiwi Extract Anaphylaxis  . Gabapentin Other (See Comments)    Auditory hallucinations  . Sulfa Antibiotics Hives    Covered legs  . Aspirin Other (See Comments)    Stomach pain  . Lexapro [Escitalopram Oxalate] Other (See Comments)    Muscle pain  . Lipitor [Atorvastatin] Other (See Comments)    Muscle pain  . Codeine Other (See Comments)    Severe stomach cramping.  . Diphenoxylate-Atropine Other (See Comments)    Severe stomach and dizziness    Antimicrobials this admission:   Cefazolin 2gm IV pre-op on 3/5   Vanc 3/7>>(3/15)   Cefepime 3/7>>(3/15)  Dose adjustments this admission:  n/a  Microbiology results:   3/7 blood x 2 -   3/7 sputum -  Thank you for allowing pharmacy to be a part of this patient's care.  Arty Baumgartner, [place] Pager: [phone] 07/08/2016 9:26 PM

## 2016-07-08 NOTE — Progress Notes (Signed)
CXR is concerning for RLL pneumonia.  DM is under fair control with SSI.  Systolic HTN is still a problem.  Calcium a little better this afternoon.  She has a PMH of HTN and DM but does not take medication for these.  Have requested an Medicine consultation to assist with the PNA and her other comorbidities.

## 2016-07-08 NOTE — Consult Note (Signed)
Medical Consultation   Annette Gross  FIE:[id number]  DOB: [birth year]  DOA: 07/06/2016  PCP: Irven Shelling, MD  Outpatient specialists:   Requesting physician: Dr. Jackolyn Confer, general surgery  Reason for consultation: Medical management of HTN, DM, pneumonia   History of Present Illness: 76 year old female with history of hypertension, diabetes mellitus, asthma, hyperlipidemia, anxiety, depression, who presented with left thyroid nodule. 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 Patient was admitted by general surgery. They have been supplementing for calcium. Patient does not take any medications at home for hypertension or diabetes. Currently she states that she feels horrible.  She has had a wheeze since her surgery. She also complains of nausea and vomiting and inability to keep anything down. Patient also complains of sinus pressure and headache. She states she recently saw her primary doctor last month and was told everything was fine. Denies chest pain, diarrhea, dizziness.  Endorses some shortness of breath with mildly productive cough. Feels she's been constipated although states her her intake has been decreased.  Review of Systems:  All other systems reviewed and are negative.    Past Medical History: Past Medical History:  Diagnosis Date  . Anemia    many yrs ago  . Anxiety   . Asthma    no recent issues  . Complication of anesthesia   . Cystitis, interstitial   . Degenerative disc disease, lumbar   . Degenerative joint disease    spinal stenosis "Chronic low back pain"  . Depression   . Diabetes mellitus    diet control.  . Diverticulitis 2005  . Diverticulosis 2005  . Factor V deficiency ([place])   . Fibromyalgia   . Fracture of right foot 1995  . GERD (gastroesophageal reflux disease)   . History of hiatal hernia   . Hyperlipidemia   . Hypertension    off med x 5 yrs.  . IBS  (irritable bowel syndrome)   . Insomnia   . Iron deficiency   . Liver hemangioma 1999   MRI  . Mononucleosis   . Morton's neuroma    Left foot  . Peripheral neuropathy ([place])   . Pneumonia    years 66 & 39.  None since  . PONV (postoperative nausea and vomiting)    nausea no vomiting  . Rectocele   . Transfusion history    child x2, many yrs ago after childbirth"hemorrhage"  . Umbilical hernia 1660    Past Surgical History: Past Surgical History:  Procedure Laterality Date  . blood vessel tumor removal  1980   from chin  . CHOLECYSTECTOMY    . COLONOSCOPY WITH PROPOFOL N/A 09/02/2015   Procedure: COLONOSCOPY WITH PROPOFOL;  Surgeon: Garlan Fair, MD;  Location: [facility];  Service: Endoscopy;  Laterality: N/A;  . GANGLION CYST EXCISION     right  . HEMORRHOID SURGERY    . HERNIA REPAIR     repair was aimed at the [facility]  . HIATAL HERNIA REPAIR     and nissen fundoplication  . LAPAROSCOPIC ESOPHAGOGASTRIC FUNDOPLASTY    . LAPAROSCOPIC INCISIONAL / UMBILICAL / VENTRAL HERNIA REPAIR     umbilical hernia  . OOPHORECTOMY     left  . RIGHT OOPHORECTOMY     '05-laparaoscopic  . SHOULDER SURGERY Right   . THYROIDECTOMY N/A 07/06/2016   Procedure: TOTAL THYROIDECTOMY;  Surgeon: Jackolyn Confer, MD;  Location: [place];  Service: General;  Laterality: N/A;  . TONSILLECTOMY    .  TOTAL THYROIDECTOMY  07/06/2016    Allergies:   Allergies  Allergen Reactions  . Ciprofloxacin Other (See Comments)    Tendonitis  . Iohexol Anaphylaxis  . Ivp Dye [Iodinated Diagnostic Agents] Anaphylaxis  . Kiwi Extract Anaphylaxis  . Gabapentin Other (See Comments)    Auditory hallucinations  . Sulfa Antibiotics Hives    Covered legs  . Aspirin Other (See Comments)    Stomach pain  . Lexapro [Escitalopram Oxalate] Other (See Comments)    Muscle pain  . Lipitor [Atorvastatin] Other (See Comments)    Muscle pain  . Codeine Other (See Comments)    Severe stomach cramping.  .  Diphenoxylate-Atropine Other (See Comments)    Severe stomach and dizziness    Social History:  reports that she has never smoked. She has never used smokeless tobacco. She reports that she drinks about 1.2 oz of alcohol per week . She reports that she does not use drugs.  Family History: Family History  Problem Relation Age of Onset  . Stroke Father   . Heart disease Father     Physical Exam: Blood pressure (!) 136/59, pulse 83, temperature 98.6 F (37 C), temperature source Oral, resp. rate 16, height 5' (1.524 m), weight 82.6 kg (182 lb), SpO2 96 %.   General: Well developed, well nourished, NAD, appears stated age  HEENT: NCAT, PERRLA, EOMI, Anicteic Sclera, mucous membranes moist.   Neck: Supple, no JVD, no masses  Cardiovascular: S1 S2 auscultated, no rubs, murmurs or gallops. Regular rate and rhythm.  Respiratory: Clear to auscultation bilaterally with equal chest rise  Abdomen: Soft, nontender, nondistended, + bowel sounds  Extremities: warm dry without cyanosis clubbing or edema  Neuro: AAOx3, cranial nerves grossly intact. Strength 5/5 in patient's upper and lower extremities bilaterally  Skin: Without rashes exudates or nodules  Psych: Normal affect and demeanor with intact judgement and insight  Data reviewed:  I have personally reviewed following labs and imaging studies Labs:  CBC: No results for input(s): WBC, NEUTROABS, HGB, HCT, MCV, PLT in the last 168 hours.  Basic Metabolic Panel:  Recent Labs Lab 07/06/16 1834 07/07/16 0737 07/07/16 1623 07/08/16 0508 07/08/16 1600  NA 131* 135 141 136 133*  K 3.6 3.8 3.4* 3.7 3.3*  CL 94* 97* 102 98* 93*  CO2 24 27 29 28 29   GLUCOSE 250* 229* 80 181* 174*  BUN 8 8 8 8 6   CREATININE 0.59 0.62 0.67 0.60 0.52  CALCIUM 8.4* 7.6* 8.7* 7.7* 7.9*   GFR Estimated Creatinine Clearance: 57.8 mL/min (by C-G formula based on SCr of 0.52 mg/dL). Liver Function Tests: No results for input(s): AST, ALT, ALKPHOS,  BILITOT, PROT, ALBUMIN in the last 168 hours. No results for input(s): LIPASE, AMYLASE in the last 168 hours. No results for input(s): AMMONIA in the last 168 hours. Coagulation profile No results for input(s): INR, PROTIME in the last 168 hours.  Cardiac Enzymes: No results for input(s): CKTOTAL, CKMB, CKMBINDEX, TROPONINI in the last 168 hours. BNP: Invalid input(s): POCBNP CBG:  Recent Labs Lab 07/07/16 1644 07/07/16 2155 07/08/16 0815 07/08/16 1222 07/08/16 1719  GLUCAP 91 179* 185* 161* 202*   D-Dimer No results for input(s): DDIMER in the last 72 hours. Hgb A1c No results for input(s): HGBA1C in the last 72 hours. Lipid Profile No results for input(s): CHOL, HDL, LDLCALC, TRIG, CHOLHDL, LDLDIRECT in the last 72 hours. Thyroid function studies No results for input(s): TSH, T4TOTAL, T3FREE, THYROIDAB in the last 72 hours.  Invalid  input(s): FREET3 Anemia work up No results for input(s): VITAMINB12, FOLATE, FERRITIN, TIBC, IRON, RETICCTPCT in the last 72 hours. Urinalysis    Component Value Date/Time   COLORURINE YELLOW 07/23/2007 0932   APPEARANCEUR CLEAR 07/23/2007 0932   LABSPEC 1.018 07/23/2007 0932   PHURINE 7.5 07/23/2007 0932   GLUCOSEU NEGATIVE 07/23/2007 0932   HGBUR NEGATIVE 07/23/2007 0932   BILIRUBINUR NEGATIVE 07/23/2007 0932   KETONESUR NEGATIVE 07/23/2007 0932   PROTEINUR NEGATIVE 07/23/2007 0932   UROBILINOGEN 0.2 07/23/2007 0932   NITRITE NEGATIVE 07/23/2007 0932   LEUKOCYTESUR  07/23/2007 0932    NEGATIVE MICROSCOPIC NOT DONE ON URINES WITH NEGATIVE PROTEIN, BLOOD, LEUKOCYTES, NITRITE, OR GLUCOSE <1000 mg/dL.    Sepsis Labs Invalid input(s): PROCALCITONIN,  WBC,  LACTICIDVEN Microbiology No results found for this or any previous visit (from the past 240 hour(s)).  Inpatient Medications:   Scheduled Meds: . calcitRIOL  0.25 mcg Oral Daily  . calcium carbonate  1,000 mg of elemental calcium Oral QID  . calcium gluconate  1 g Intravenous  Once  . ceFEPime (MAXIPIME) IV  1 g Intravenous Q8H  . docusate sodium  100 mg Oral BID  . DULoxetine  30 mg Oral Daily  . enoxaparin (LOVENOX) injection  40 mg Subcutaneous Q24H  . fluticasone  2 spray Each Nare Daily  . insulin aspart  0-15 Units Subcutaneous TID WC  . levothyroxine  100 mcg Oral QAC breakfast  . lisinopril  10 mg Oral Daily  . loratadine  10 mg Oral QHS  . montelukast  10 mg Oral QPM  . pantoprazole  40 mg Oral Daily  . polyethylene glycol  17 g Oral Daily   Continuous Infusions: . 0.9 % NaCl with KCl 40 mEq / L      Radiological Exams on Admission: Dg Chest 2 View  Result Date: 07/08/2016 CLINICAL DATA:  Fever, shortness of breath, wheezing EXAM: CHEST  2 VIEW COMPARISON:  10/02/2014 FINDINGS: Cardiomegaly. Consolidation at the right lung base. No focal opacity on the left. No effusions or acute bony abnormality. IMPRESSION: Opacity at the right lung base concerning for pneumonia. Cardiomegaly. Electronically Signed   By: Rolm Baptise M.D.   On: 07/08/2016 15:45    Impression/Recommendations Left thyroid nodule/papillary carcinoma -Treatment per primary -Status post thyroidectomy  Essential hypertension -Currently on no home medications -Given diabetes, will start patient on lisinopril 10 mg and will monitor BP as well as renal function closely  Diabetes mellitus, type II -Last hemoglobin A1c was 7.3 in February 2018 -No not sure why patient is not on any medications for diabetes  -Will consult diabetes coordinator for education -Continue insulin sliding scale CBG monitoring -Upon discharge, metformin may be a possibility  Pneumonia, healthcare associated -Will order pneumonia order set, vancomycin and cefepime per pharmacy -Sputum culture if possible -HIV, urine strep pneumonia urine antigen -Fluttler valve, incentive spirometry -CBC for the morning  Hypocalcemia -Will obtain CMP and ionized calcium for the morning -Patient may need correction for  her albumin -Continue supplementation  Hypokalemia /Hypomagnesemia -Will replace and continue to monitor  Malnutrition -Will consult nutrition, patient may benefit from supplements  Sinus headache/congestion -Continue flonase, claritin -Will add on Fioricet for headache  Asthma -Continue albuterol nebs scheduled and PRN  Thank you for the consultation and allowing [place] to participate in the care and management of your patient. Will continue to follow the patient with you.  Time Spent: 60 minutes  Miracle Mongillo D.O. [facility] 07/08/2016, 5:34 PM

## 2016-07-08 NOTE — Progress Notes (Addendum)
Assessment Principal Problem:   Highly dysplastic (Bethesda V) left thyroid nodule s/p total thyroidectomy-pathology demonstrates a 1.2 cm papillary carcinoma with extracapsular extension (we discussed this, outpatient Endocrinology referral   and possible need for radioactive iodine treatment).   Post op hypocalcemia-Calcium low this AM.   Asthma-requiring intermittent Albuterol nebulizer treatment   HTN-SBP elevated this AM;    DM- CBGs under better control with SSI.   DVT prophylaxis-SCDs, Lovenox.   Plan:  Increase oral calcium supplementation and add Rocaltrol. Restart Flonase and Singulair for sinus headache. Check calcium levels BID. Continue SSI.  Albuterol treatments as needed.  Miralax for constipation. Prn labetolol.    LOS: 1 day     2 Days Post-Op  Subjective: She c/o a sinus headache this AM.  She has chronic nausea in the mornings and had some this morning. Constipated (takes Miralax at home). Not sleeping well. Not walking.  No flatus or BM.  Objective: Vital signs in last 24 hours: Temp:  [98.6 F (37 C)-99.5 F (37.5 C)] 99.2 F (37.3 C) (03/07 0515) Pulse Rate:  [89-94] 94 (03/07 0515) Resp:  [17-19] 18 (03/07 0515) BP: (155-171)/(57-89) 165/58 (03/07 0515) SpO2:  [96 %-98 %] 97 % (03/07 0515)    Intake/Output from previous day: 03/06 0701 - 03/07 0700 In: 1480 [P.O.:180; I.V.:1300] Out: 1675 [Urine:1675] Intake/Output this shift: No intake/output data recorded.  PE: General- In NAD. Voice ok. Neck-incision is clean and intact, no swelling Lungs-soft, bilateral wheezes;  No stridor or increased WOB.   Lab Results:  No results for input(s): WBC, HGB, HCT, PLT in the last 72 hours. BMET  Recent Labs  07/07/16 1623 07/08/16 0508  NA 141 136  K 3.4* 3.7  CL 102 98*  CO2 29 28  GLUCOSE 80 181*  BUN 8 8  CREATININE 0.67 0.60  CALCIUM 8.7* 7.7*   PT/INR No results for input(s): LABPROT, INR in the last 72 hours. Comprehensive Metabolic  Panel:    Component Value Date/Time   NA 136 07/08/2016 0508   NA 141 07/07/2016 1623   K 3.7 07/08/2016 0508   K 3.4 (L) 07/07/2016 1623   CL 98 (L) 07/08/2016 0508   CL 102 07/07/2016 1623   CO2 28 07/08/2016 0508   CO2 29 07/07/2016 1623   BUN 8 07/08/2016 0508   BUN 8 07/07/2016 1623   CREATININE 0.60 07/08/2016 0508   CREATININE 0.67 07/07/2016 1623   GLUCOSE 181 (H) 07/08/2016 0508   GLUCOSE 80 07/07/2016 1623   CALCIUM 7.7 (L) 07/08/2016 0508   CALCIUM 8.7 (L) 07/07/2016 1623   AST 21 06/29/2016 1151   AST 36 07/19/2007 0415   ALT 19 06/29/2016 1151   ALT 103 (H) 07/19/2007 0415   ALKPHOS 110 06/29/2016 1151   ALKPHOS 145 (H) 07/19/2007 0415   BILITOT 0.6 06/29/2016 1151   BILITOT 0.5 07/19/2007 0415   PROT 6.7 06/29/2016 1151   PROT 5.1 (L) 07/19/2007 0415   ALBUMIN 3.9 06/29/2016 1151   ALBUMIN 2.4 (L) 07/19/2007 0415     Studies/Results: No results found.  Anti-infectives: Anti-infectives    Start     Dose/Rate Route Frequency Ordered Stop   07/06/16 0919  ceFAZolin (ANCEF) 2-4 GM/100ML-% IVPB    Comments:  Rosenberger, [name]: cabinet override      07/06/16 0919 07/06/16 1141   07/06/16 0914  ceFAZolin (ANCEF) IVPB 2g/100 mL premix     2 g 200 mL/hr over 30 Minutes Intravenous On call to O.R. 07/06/16 3536  07/06/16 1141       Annette Gross J 07/08/2016

## 2016-07-09 LAB — GLUCOSE, CAPILLARY
GLUCOSE-CAPILLARY: 182 mg/dL — AB (ref 65–99)
GLUCOSE-CAPILLARY: 133 mg/dL — AB (ref 65–99)
GLUCOSE-CAPILLARY: 83 mg/dL (ref 65–99)
Glucose-Capillary: 185 mg/dL — ABNORMAL HIGH (ref 65–99)

## 2016-07-09 LAB — COMPREHENSIVE METABOLIC PANEL
ALBUMIN: 3 g/dL — AB (ref 3.5–5.0)
ALK PHOS: 82 U/L (ref 38–126)
ALT: 24 U/L (ref 14–54)
AST: 19 U/L (ref 15–41)
Anion gap: 8 (ref 5–15)
BUN: 8 mg/dL (ref 6–20)
CALCIUM: 7.6 mg/dL — AB (ref 8.9–10.3)
CO2: 30 mmol/L (ref 22–32)
CREATININE: 0.58 mg/dL (ref 0.44–1.00)
Chloride: 92 mmol/L — ABNORMAL LOW (ref 101–111)
GFR calc Af Amer: >60 mL/min (ref >60)
GFR calc non Af Amer: >60 mL/min (ref >60)
GLUCOSE: 130 mg/dL — AB (ref 65–99)
Potassium: 3.4 mmol/L — ABNORMAL LOW (ref 3.5–5.1)
Sodium: 130 mmol/L — ABNORMAL LOW (ref 135–145)
Total Bilirubin: 0.8 mg/dL (ref 0.3–1.2)
Total Protein: 5.9 g/dL — ABNORMAL LOW (ref 6.5–8.1)

## 2016-07-09 LAB — MAGNESIUM: Magnesium: 1.9 mg/dL (ref 1.7–2.4)

## 2016-07-09 LAB — CBC
HEMATOCRIT: 39.3 % (ref 36.0–46.0)
HEMOGLOBIN: 12.9 g/dL (ref 12.0–15.0)
MCH: 31.2 pg (ref 26.0–34.0)
MCHC: 32.8 g/dL (ref 30.0–36.0)
MCV: 95.2 fL (ref 78.0–100.0)
Platelets: 317 10*3/uL (ref 150–400)
RBC: 4.13 MIL/uL (ref 3.87–5.11)
RDW: 13.1 % (ref 11.5–15.5)
WBC: 9.5 10*3/uL (ref 4.0–10.5)

## 2016-07-09 LAB — CALCIUM, IONIZED: Calcium, Ionized, Serum: 4.2 mg/dL — ABNORMAL LOW (ref 4.5–5.6)

## 2016-07-09 MED ORDER — CALCITRIOL 0.25 MCG PO CAPS: 0.2500 ug | ORAL_CAPSULE | Freq: Every day | ORAL | 1 refills | Status: DC

## 2016-07-09 MED ORDER — BOOST / RESOURCE BREEZE PO LIQD
1.0000 | Freq: Two times a day (BID) | ORAL | Status: DC
  Administered 2016-07-09 – 2016-07-10 (×4): 1 via ORAL

## 2016-07-09 MED ORDER — CALCIUM CARBONATE 1250 (500 CA) MG PO TABS: 4.0000 | ORAL_TABLET | Freq: Four times a day (QID) | ORAL | 3 refills | Status: DC

## 2016-07-09 MED ORDER — CARVEDILOL 6.25 MG PO TABS
6.2500 mg | ORAL_TABLET | Freq: Two times a day (BID) | ORAL | Status: DC
  Administered 2016-07-09 – 2016-07-10 (×3): 6.25 mg via ORAL
  Filled 2016-07-09 (×3): qty 1

## 2016-07-09 MED ORDER — CALCIUM CARBONATE 1250 (500 CA) MG PO TABS
2000.0000 mg | ORAL_TABLET | Freq: Four times a day (QID) | ORAL | Status: DC
  Administered 2016-07-09 – 2016-07-10 (×7): 2000 mg via ORAL
  Filled 2016-07-09 (×9): qty 4

## 2016-07-09 MED ORDER — SODIUM CHLORIDE 0.9 % IV SOLN
1.0000 g | Freq: Once | INTRAVENOUS | Status: AC
  Administered 2016-07-09: 1 g via INTRAVENOUS
  Filled 2016-07-09: qty 10

## 2016-07-09 MED ORDER — ALBUTEROL SULFATE (2.5 MG/3ML) 0.083% IN NEBU
2.5000 mg | INHALATION_SOLUTION | Freq: Three times a day (TID) | RESPIRATORY_TRACT | Status: DC
  Administered 2016-07-09 (×3): 2.5 mg via RESPIRATORY_TRACT
  Filled 2016-07-09 (×4): qty 3

## 2016-07-09 MED ORDER — POTASSIUM CHLORIDE CRYS ER 20 MEQ PO TBCR
40.0000 meq | EXTENDED_RELEASE_TABLET | Freq: Once | ORAL | Status: AC
  Administered 2016-07-09: 40 meq via ORAL
  Filled 2016-07-09: qty 2

## 2016-07-09 NOTE — Progress Notes (Signed)
Initial Nutrition Assessment  DOCUMENTATION CODES:   Obesity unspecified  INTERVENTION:   -Boost Breeze po BID, each supplement provides 250 kcal and 9 grams of protein  NUTRITION DIAGNOSIS:   Inadequate oral intake related to nausea as evidenced by meal completion < 25%, per patient/family report.  GOAL:   Patient will meet greater than or equal to 90% of their needs  MONITOR:   PO intake, Supplement acceptance, Labs, Weight trends, Skin, I & O's  REASON FOR ASSESSMENT:   Consult Assessment of nutrition requirement/status  ASSESSMENT:   76 year old female with history of hypertension, diabetes mellitus, asthma, hyperlipidemia, anxiety, depression, who presented with left thyroid nodule. 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 Patient was admitted by general surgery. They have been supplementing for calcium. Patient does not take any medications at home for hypertension or diabetes.  S/p Procedure(s) (LRB) on 07/06/16: TOTAL THYROIDECTOMY (N/A)  Spoke with pt at bedside, who reports great appetite PTA. She generally consumes 3 meals per day, consisting of meat, starch, and vegetable. Beverages are mainly diet soda and water. Pt reports she has gained wt over the past year, secondary to thyroid condition.   Pt shares fair DM control PTA. She does not self-test at home and was not on any medications PTA. Per her report, her last Hgb A1c was 7.3 and goal was set for 7.6- pt shares that MD would likely put her on oral medications of DM control if Hgb A1c rose about 7.6. DM coordinator following.   Pt reports poor oral intake during hospitalization, secondary to nausea. She shares the food odors exacerbate symptoms. Noted meal completion 10%. Discussed with pt ordering cold foods with less odors to help alleviate nausea to improve PO intake. Pt shares that she is lactose intolerance and refused Glucerna supplement. Will  trial Boost Breeze and will monitor blood sugars and acceptance.   Nutrition-Focused physical exam completed. Findings are no fat depletion, no muscle depletion, and no edema.   Albumin has a half-life of 21 days and is strongly affected by stress response and inflammatory process, therefore, do not expect to see an improvement in this lab value during acute hospitalization. When a patient presents with low albumin, it is likely skewed due to the acute inflammatory response.  Unless it is suspected that patient had poor PO intake or malnutrition prior to admission, then RD should not be consulted solely for low albumin. Note that low albumin is no longer used to diagnose malnutrition; [facility] uses the new malnutrition guidelines published by the American Society for Parenteral and Enteral Nutrition (A.S.P.E.N.) and the Academy of Nutrition and Dietetics (AND).    Labs reviewed: Na: 130 (on IV supplementation), K: 3.4 (on IV supplementation), CBGS: 113-182.   Diet Order:  Diet Carb Modified Fluid consistency: Thin; Room service appropriate? Yes  Skin:   (closed neck incision)  Last BM:  PTA  Height:   Ht Readings from Last 1 Encounters:  07/06/16 5' (1.524 m)    Weight:   Wt Readings from Last 1 Encounters:  07/06/16 182 lb (82.6 kg)    Ideal Body Weight:  45.5 kg  BMI:  Body mass index is 35.54 kg/m.  Estimated Nutritional Needs:   Kcal:  1600-1800  Protein:  70-85 grams  Fluid:  1.6-1.8 L  EDUCATION NEEDS:   Education needs addressed  Thu Baggett A. Jimmye Norman, RD, LDN, CDE Pager: [phone] After hours Pager: [phone]

## 2016-07-09 NOTE — Progress Notes (Signed)
PROGRESS NOTE    Annette Gross  IOE:[id number] DOB: [birth year] DOA: 07/06/2016 PCP: Irven Shelling, MD   No chief complaint on file.    Brief Narrative:  Consulted for medical management of DM, HTN, Asthma, pneumonia  Assessment & Plan   Left thyroid nodule/papillary carcinoma -Treatment per primary -Status post thyroidectomy  Essential hypertension -Currently on no home medications -Given diabetes, Started patient on lisinopril 10 mg  -Spoke with Dr. Laurann Montana, patient has historically had a low/Normal BP.  Possibly elevated as she is in the hospital. Would like for patient to follow up with him in the office for BP meds/needs  Diabetes mellitus, type II -Last hemoglobin A1c was 7.3 in February 2018 -No not sure why patient is not on any medications for diabetes  -Will consult diabetes coordinator for education -Continue insulin sliding scale CBG monitoring -Upon discharge, metformin may be a possibility (Dr. Laurann Montana did agree with metformin upon discharge). Will start low and slow, 500mg  daily.  Pneumonia, healthcare associated -Continue vancomycin and cefepime per pharmacy -Sputum culture if possible -HIV, urine strep pneumonia urine antigen -Fluttler valve, incentive spirometry -CBC for the morning  Hypocalcemia -corrected calcium 8.4 this morning -ionized calcium 4.2 -Continue supplementation  Hypokalemia /Hypomagnesemia -Continue to replace and monitor   Hyponatremia -Currently 130 -TSH 2.675 on 06/29/2016 -Currently on NS -Continue to monitor BMP  Malnutrition -Nutrition consulted, patient may benefit from supplements  Sinus headache/congestion -Continue flonase, claritin -Continue Fioricet, hydrocodone for headache  Asthma -Continue albuterol nebs scheduled and PRN  DVT Prophylaxis  Lovenox  Code Status: Full  Family Communication: None at bedside  Disposition Plan: Admitted, disposition per primary  team  Consultants TRH  Procedures  thyroidectomy  Antibiotics   Anti-infectives    Start     Dose/Rate Route Frequency Ordered Stop   07/08/16 1900  vancomycin (VANCOCIN) IVPB 750 mg/150 ml premix     750 mg 150 mL/hr over 60 Minutes Intravenous Every 12 hours 07/08/16 1806 07/16/16 1859   07/08/16 1800  ceFEPIme (MAXIPIME) 1 g in dextrose 5 % 50 mL IVPB     1 g 100 mL/hr over 30 Minutes Intravenous Every 8 hours 07/08/16 1734 07/16/16 1759   07/06/16 0919  ceFAZolin (ANCEF) 2-4 GM/100ML-% IVPB    Comments:  Rosenberger, [name]: cabinet override      07/06/16 0919 07/06/16 1141   07/06/16 0914  ceFAZolin (ANCEF) IVPB 2g/100 mL premix     2 g 200 mL/hr over 30 Minutes Intravenous On call to O.R. 07/06/16 0914 07/06/16 1141      Subjective:   Karsten Fells seen and examined today.  Patient states she is feeling better today.  Feels her headache has improved as well as sinus congestion.  Feels breathing is also improving.  Continues to feel weak.  Denies chest pain, abdominal pain.   Objective:   Vitals:   07/08/16 2057 07/08/16 2148 07/09/16 0643 07/09/16 0928  BP:  (!) 139/50 (!) 154/93   Pulse:  87 92 91  Resp:  18  18  Temp:  98.7 F (37.1 C) 98.7 F (37.1 C)   TempSrc:  Oral Oral   SpO2: 97% 98% 97% 96%  Weight:      Height:        Intake/Output Summary (Last 24 hours) at 07/09/16 1217 Last data filed at 07/09/16 1159  Gross per 24 hour  Intake          1589.17 ml  Output  1740 ml  Net          -150.83 ml   Filed Weights   07/06/16 0853  Weight: 82.6 kg (182 lb)    Exam  General: Well developed, well nourished, NAD, appears stated age  HEENT: NCAT, mucous membranes moist.   Cardiovascular: S1 S2 auscultated, no rubs, murmurs or gallops. Regular rate and rhythm.  Respiratory: Diminished but clear, mild expiratory wheezing anteriorly (?VC)  Abdomen: Soft, nontender, nondistended, + bowel sounds  Extremities: warm dry without cyanosis  clubbing or edema  Neuro: AAOx3, nonfocal  Psych: Normal affect and demeanor with intact judgement and insight   Data Reviewed: I have personally reviewed following labs and imaging studies  CBC:  Recent Labs Lab 07/09/16 0415  WBC 9.5  HGB 12.9  HCT 39.3  MCV 95.2  PLT 119   Basic Metabolic Panel:  Recent Labs Lab 07/07/16 0737 07/07/16 1623 07/08/16 0508 07/08/16 1600 07/08/16 1730 07/09/16 0415  NA 135 141 136 133*  --  130*  K 3.8 3.4* 3.7 3.3*  --  3.4*  CL 97* 102 98* 93*  --  92*  CO2 27 29 28 29   --  30  GLUCOSE 229* 80 181* 174*  --  130*  BUN 8 8 8 6   --  8  CREATININE 0.62 0.67 0.60 0.52  --  0.58  CALCIUM 7.6* 8.7* 7.7* 7.9*  --  7.6*  MG  --   --   --   --  1.6* 1.9   GFR: Estimated Creatinine Clearance: 57.8 mL/min (by C-G formula based on SCr of 0.58 mg/dL). Liver Function Tests:  Recent Labs Lab 07/09/16 0415  AST 19  ALT 24  ALKPHOS 82  BILITOT 0.8  PROT 5.9*  ALBUMIN 3.0*   No results for input(s): LIPASE, AMYLASE in the last 168 hours. No results for input(s): AMMONIA in the last 168 hours. Coagulation Profile: No results for input(s): INR, PROTIME in the last 168 hours. Cardiac Enzymes: No results for input(s): CKTOTAL, CKMB, CKMBINDEX, TROPONINI in the last 168 hours. BNP (last 3 results) No results for input(s): PROBNP in the last 8760 hours. HbA1C: No results for input(s): HGBA1C in the last 72 hours. CBG:  Recent Labs Lab 07/08/16 1222 07/08/16 1719 07/08/16 2147 07/09/16 0806 07/09/16 1156  GLUCAP 161* 202* 113* 182* 133*   Lipid Profile: No results for input(s): CHOL, HDL, LDLCALC, TRIG, CHOLHDL, LDLDIRECT in the last 72 hours. Thyroid Function Tests: No results for input(s): TSH, T4TOTAL, FREET4, T3FREE, THYROIDAB in the last 72 hours. Anemia Panel: No results for input(s): VITAMINB12, FOLATE, FERRITIN, TIBC, IRON, RETICCTPCT in the last 72 hours. Urine analysis:    Component Value Date/Time   COLORURINE  YELLOW 07/23/2007 0932   APPEARANCEUR CLEAR 07/23/2007 0932   LABSPEC 1.018 07/23/2007 0932   PHURINE 7.5 07/23/2007 0932   GLUCOSEU NEGATIVE 07/23/2007 0932   HGBUR NEGATIVE 07/23/2007 0932   BILIRUBINUR NEGATIVE 07/23/2007 0932   KETONESUR NEGATIVE 07/23/2007 0932   PROTEINUR NEGATIVE 07/23/2007 0932   UROBILINOGEN 0.2 07/23/2007 0932   NITRITE NEGATIVE 07/23/2007 0932   LEUKOCYTESUR  07/23/2007 0932    NEGATIVE MICROSCOPIC NOT DONE ON URINES WITH NEGATIVE PROTEIN, BLOOD, LEUKOCYTES, NITRITE, OR GLUCOSE <1000 mg/dL.   Sepsis Labs: @LABRCNTIP (procalcitonin:4,lacticidven:4)  )No results found for this or any previous visit (from the past 240 hour(s)).    Radiology Studies: Dg Chest 2 View  Result Date: 07/08/2016 CLINICAL DATA:  Fever, shortness of breath, wheezing EXAM: CHEST  2  VIEW COMPARISON:  10/02/2014 FINDINGS: Cardiomegaly. Consolidation at the right lung base. No focal opacity on the left. No effusions or acute bony abnormality. IMPRESSION: Opacity at the right lung base concerning for pneumonia. Cardiomegaly. Electronically Signed   By: Rolm Baptise M.D.   On: 07/08/2016 15:45     Scheduled Meds: . albuterol  2.5 mg Nebulization TID  . calcitRIOL  0.25 mcg Oral Daily  . calcium carbonate  2,000 mg of elemental calcium Oral QID  . carvedilol  6.25 mg Oral BID WC  . ceFEPime (MAXIPIME) IV  1 g Intravenous Q8H  . docusate sodium  100 mg Oral BID  . DULoxetine  30 mg Oral Daily  . enoxaparin (LOVENOX) injection  40 mg Subcutaneous Q24H  . feeding supplement  1 Container Oral BID BM  . fluticasone  2 spray Each Nare Daily  . insulin aspart  0-15 Units Subcutaneous TID WC  . levothyroxine  100 mcg Oral QAC breakfast  . lisinopril  10 mg Oral Daily  . loratadine  10 mg Oral QHS  . montelukast  10 mg Oral QPM  . pantoprazole  40 mg Oral Daily  . polyethylene glycol  17 g Oral Daily  . vancomycin  750 mg Intravenous Q12H   Continuous Infusions: . 0.9 % NaCl with KCl 40  mEq / L 50 mL/hr (07/09/16 0845)     LOS: 2 days   Time Spent in minutes   30 minutes  Vonzella Althaus D.O. on 07/09/2016 at 12:17 PM  Between 7am to 7pm - Pager - [phone]  After 7pm go to [email or web address] - password [email or web address]  And look for the night coverage person covering for me after hours  [facility] Office  [phone]

## 2016-07-09 NOTE — Progress Notes (Signed)
Assessment Principal Problem:   Papillary thyroid cancer s/p total thyroidectomy-pathology demonstrates a 1.2 cm papillary carcinoma with extracapsular extension (we discussed this, outpatient Endocrinology referral   and possible need for radioactive iodine treatment).   Post op hypocalcemia-calcium level 7.6 (corrected for hypoalbuminemia is about 8.4) this AM; 9.9 preop   Asthma-requiring intermittent Albuterol nebulizer treatment   HTN-medications started   DM- CBGs under better control with SSI.   DVT prophylaxis-SCDs, Lovenox.   Plan:  Increase oral calcium supplementation.  Await ionized calcium results. Solid diet.    I will be out of town beginning this afternoon.  Dr. Harlow Asa will see her tomorrow for me.    LOS: 2 days     3 Days Post-Op  Subjective: She feels better this morning after antibiotics were started. Would like to try solid food.  Objective: Vital signs in last 24 hours: Temp:  [98.6 F (37 C)-99.6 F (37.6 C)] 98.7 F (37.1 C) (03/08 0643) Pulse Rate:  [83-105] 92 (03/08 0643) Resp:  [16-18] 18 (03/07 2148) BP: (136-195)/(50-93) 154/93 (03/08 0643) SpO2:  [92 %-98 %] 97 % (03/08 0643)    Intake/Output from previous day: 03/07 0701 - 03/08 0700 In: 1699.2 [P.O.:240; I.V.:1099.2; IV Piggyback:360] Out: 1700 [Urine:1650; Blood:50] Intake/Output this shift: Total I/O In: 1171.3 [P.O.:120; I.V.:801.3; IV Piggyback:250] Out: 1150 [Urine:1100; Blood:50]  PE: General- In NAD. Voice ok. Neck-incision is clean and intact, no swelling    Lab Results:   Recent Labs  07/09/16 0415  WBC 9.5  HGB 12.9  HCT 39.3  PLT 317   BMET  Recent Labs  07/08/16 1600 07/09/16 0415  NA 133* 130*  K 3.3* 3.4*  CL 93* 92*  CO2 29 30  GLUCOSE 174* 130*  BUN 6 8  CREATININE 0.52 0.58  CALCIUM 7.9* 7.6*   PT/INR No results for input(s): LABPROT, INR in the last 72 hours. Comprehensive Metabolic Panel:    Component Value Date/Time   NA 130 (L)  07/09/2016 0415   NA 133 (L) 07/08/2016 1600   K 3.4 (L) 07/09/2016 0415   K 3.3 (L) 07/08/2016 1600   CL 92 (L) 07/09/2016 0415   CL 93 (L) 07/08/2016 1600   CO2 30 07/09/2016 0415   CO2 29 07/08/2016 1600   BUN 8 07/09/2016 0415   BUN 6 07/08/2016 1600   CREATININE 0.58 07/09/2016 0415   CREATININE 0.52 07/08/2016 1600   GLUCOSE 130 (H) 07/09/2016 0415   GLUCOSE 174 (H) 07/08/2016 1600   CALCIUM 7.6 (L) 07/09/2016 0415   CALCIUM 7.9 (L) 07/08/2016 1600   AST 19 07/09/2016 0415   AST 21 06/29/2016 1151   ALT 24 07/09/2016 0415   ALT 19 06/29/2016 1151   ALKPHOS 82 07/09/2016 0415   ALKPHOS 110 06/29/2016 1151   BILITOT 0.8 07/09/2016 0415   BILITOT 0.6 06/29/2016 1151   PROT 5.9 (L) 07/09/2016 0415   PROT 6.7 06/29/2016 1151   ALBUMIN 3.0 (L) 07/09/2016 0415   ALBUMIN 3.9 06/29/2016 1151     Studies/Results: Dg Chest 2 View  Result Date: 07/08/2016 CLINICAL DATA:  Fever, shortness of breath, wheezing EXAM: CHEST  2 VIEW COMPARISON:  10/02/2014 FINDINGS: Cardiomegaly. Consolidation at the right lung base. No focal opacity on the left. No effusions or acute bony abnormality. IMPRESSION: Opacity at the right lung base concerning for pneumonia. Cardiomegaly. Electronically Signed   By: Rolm Baptise M.D.   On: 07/08/2016 15:45    Anti-infectives: Anti-infectives    Start  Dose/Rate Route Frequency Ordered Stop   07/08/16 1900  vancomycin (VANCOCIN) IVPB 750 mg/150 ml premix     750 mg 150 mL/hr over 60 Minutes Intravenous Every 12 hours 07/08/16 1806 07/16/16 1859   07/08/16 1800  ceFEPIme (MAXIPIME) 1 g in dextrose 5 % 50 mL IVPB     1 g 100 mL/hr over 30 Minutes Intravenous Every 8 hours 07/08/16 1734 07/16/16 1759   07/06/16 0919  ceFAZolin (ANCEF) 2-4 GM/100ML-% IVPB    Comments:  Rosenberger, [name]: cabinet override      07/06/16 0919 07/06/16 1141   07/06/16 0914  ceFAZolin (ANCEF) IVPB 2g/100 mL premix     2 g 200 mL/hr over 30 Minutes Intravenous On call to  O.R. 07/06/16 0914 07/06/16 1141       Demetri Kerman J 07/09/2016

## 2016-07-09 NOTE — Progress Notes (Signed)
Inpatient Diabetes Program Recommendations  AACE/ADA: New Consensus Statement on Inpatient Glycemic Control (2015)  Target Ranges:  Prepandial:   less than 140 mg/dL      Peak postprandial:   less than 180 mg/dL (1-2 hours)      Critically ill patients:  140 - 180 mg/dL   Spoke with patient about diabetes and home regimen for diabetes control. Patient reports that she is followed by her PCP, Dr. Laurann Montana with [facility], for diabetes management and currently is not on medications. She took DM classes 2 years ago. Patient reports because of the bowel syndrome that she has, most of the veggies that she eats "runs right through her." Patient reports she follows up with her Doctor every 6 months and this past time her A1c was 7.6% and the possible need for oral DM medications was brought up. Patient states at her next appointment if her A1c increased, She was going to be placed on medications. There is not a whole lot of modifications for the patient to do and she is not able to exercise. Spoke with patient about her current A1c level.  Patient does not have any questions, concerns, or needs at this time. Recommend Patient follow up with her PCP as scheduled and have him reevaluate her need for DM medications. Patient's A1c is almost at goal.  Patient has a glucose meter and supplies at home.   Thanks,  Tama Headings RN, MSN, [facility] Inpatient Diabetes Coordinator Team Pager [phone] (8a-5p)

## 2016-07-10 ENCOUNTER — Inpatient Hospital Stay (HOSPITAL_COMMUNITY): Payer: Medicare Other

## 2016-07-10 DIAGNOSIS — E039 Hypothyroidism, unspecified: Secondary | ICD-10-CM

## 2016-07-10 LAB — BASIC METABOLIC PANEL
Anion gap: 6 (ref 5–15)
BUN: 9 mg/dL (ref 6–20)
CHLORIDE: 94 mmol/L — AB (ref 101–111)
CO2: 29 mmol/L (ref 22–32)
Calcium: 7.1 mg/dL — ABNORMAL LOW (ref 8.9–10.3)
Creatinine, Ser: 0.62 mg/dL (ref 0.44–1.00)
GFR calc Af Amer: >60 mL/min (ref >60)
GLUCOSE: 181 mg/dL — AB (ref 65–99)
POTASSIUM: 4 mmol/L (ref 3.5–5.1)
Sodium: 129 mmol/L — ABNORMAL LOW (ref 135–145)

## 2016-07-10 LAB — GLUCOSE, CAPILLARY
GLUCOSE-CAPILLARY: 193 mg/dL — AB (ref 65–99)
Glucose-Capillary: 143 mg/dL — ABNORMAL HIGH (ref 65–99)

## 2016-07-10 LAB — MAGNESIUM: MAGNESIUM: 1.5 mg/dL — AB (ref 1.7–2.4)

## 2016-07-10 MED ORDER — MAGNESIUM SULFATE 2 GM/50ML IV SOLN
2.0000 g | Freq: Once | INTRAVENOUS | Status: AC
  Administered 2016-07-10: 2 g via INTRAVENOUS
  Filled 2016-07-10: qty 50

## 2016-07-10 MED ORDER — METFORMIN HCL 500 MG PO TABS: 500.0000 mg | ORAL_TABLET | Freq: Every day | ORAL | 0 refills | Status: DC

## 2016-07-10 MED ORDER — ALBUTEROL SULFATE (2.5 MG/3ML) 0.083% IN NEBU: 2.5000 mg | INHALATION_SOLUTION | Freq: Two times a day (BID) | RESPIRATORY_TRACT | Status: DC

## 2016-07-10 MED ORDER — BOOST / RESOURCE BREEZE PO LIQD: 1.0000 | Freq: Two times a day (BID) | ORAL | 0 refills | Status: DC

## 2016-07-10 MED ORDER — AMOXICILLIN-POT CLAVULANATE 875-125 MG PO TABS: 1.0000 | ORAL_TABLET | Freq: Two times a day (BID) | ORAL | 0 refills | Status: DC

## 2016-07-10 MED ORDER — LISINOPRIL 10 MG PO TABS: 10.0000 mg | ORAL_TABLET | Freq: Every day | ORAL | 0 refills | Status: DC

## 2016-07-10 MED ORDER — CARVEDILOL 6.25 MG PO TABS: 6.2500 mg | ORAL_TABLET | Freq: Two times a day (BID) | ORAL | 0 refills | Status: DC

## 2016-07-10 NOTE — Progress Notes (Signed)
PROGRESS NOTE    Annette Gross  DJT:[id number] DOB: Dec 08, 1940 DOA: 07/06/2016 PCP: Irven Shelling, MD   No chief complaint on file.    Brief Narrative:  Consulted for medical management of DM, HTN, Asthma, pneumonia  Assessment & Plan   Left thyroid nodule/papillary carcinoma -Treatment per primary -Status post thyroidectomy  Essential hypertension -Currently on no home medications -Given diabetes, Started patient on lisinopril 10 mg  -Spoke with Dr. Laurann Montana, patient has historically had a low/Normal BP.  Possibly elevated as she is in the hospital. Would like for patient to follow up with him in the office for BP meds/needs -Will discharge patient with scripts for lisinopril and coreg. Have instructed patient to use her husband's blood pressure cuff and write down her BP readings and follow up with Dr. Laurann Montana.   Diabetes mellitus, type II -Last hemoglobin A1c was 7.3 in February 2018 -No not sure why patient is not on any medications for diabetes  -Will consult diabetes coordinator for education -Continue insulin sliding scale CBG monitoring -Upon discharge, metformin may be a possibility (Dr. Laurann Montana did agree with metformin upon discharge). Will start low and slow, 500mg  daily.  Pneumonia, healthcare associated -Was placed on  vancomycin and cefepime per pharmacy- will discharge with Augmentin -Sputum culture if possible -urine strep pneumonia urine antigen negative -Fluttler valve, incentive spirometry  Hypocalcemia -corrected calcium still low, currently 7.9 -ionized calcium 4.2 -Continue supplementation -repeat calcium in one week  Hypokalemia /Hypomagnesemia -Continue to replace and monitor- repeat BMP and magnesium in one week  Hyponatremia -Currently 129 -TSH 2.675 on 06/29/2016 -Currently on NS  Malnutrition -Nutrition consulted, patient may benefit from supplements  Sinus headache/congestion -Continue flonase,  claritin -Improved.  Asthma -Continue albuterol nebs scheduled and PRN  DVT Prophylaxis  Lovenox  Code Status: Full  Family Communication: None at bedside  Disposition Plan: Admitted, disposition per primary team (home today) Have spoken with Dr. Harlow Asa. Scripts for BP meds, metformin, Augmentin sent to patient's pharmacy.  Consultants [facility]  Procedures  thyroidectomy  Antibiotics   Anti-infectives    Start     Dose/Rate Route Frequency Ordered Stop   07/10/16 0000  amoxicillin-clavulanate (AUGMENTIN) 875-125 MG tablet     1 tablet Oral 2 times daily 07/10/16 1433     07/08/16 1900  vancomycin (VANCOCIN) IVPB 750 mg/150 ml premix     750 mg 150 mL/hr over 60 Minutes Intravenous Every 12 hours 07/08/16 1806 07/16/16 1759   07/08/16 1800  ceFEPIme (MAXIPIME) 1 g in dextrose 5 % 50 mL IVPB     1 g 100 mL/hr over 30 Minutes Intravenous Every 8 hours 07/08/16 1734 07/16/16 1759   07/06/16 0919  ceFAZolin (ANCEF) 2-4 GM/100ML-% IVPB    Comments:  Rosenberger, [name]: cabinet override      07/06/16 0919 07/06/16 1141   07/06/16 0914  ceFAZolin (ANCEF) IVPB 2g/100 mL premix     2 g 200 mL/hr over 30 Minutes Intravenous On call to O.R. 07/06/16 0914 07/06/16 1141      Subjective:   Annette Gross seen and examined today.  Feels her headache has improved as well as sinus congestion.  Continues to feel weak.  Denies chest pain, abdominal pain.   Objective:   Vitals:   07/09/16 1924 07/09/16 2137 07/10/16 0609 07/10/16 0905  BP:  118/63 (!) 149/57   Pulse:  83 81   Resp:  19 18   Temp:  99 F (37.2 C) 98.5 F (36.9 C) 98.2 F (36.8 C)  TempSrc:  Oral Oral Oral  SpO2: 99% 98% 97% 95%  Weight:      Height:        Intake/Output Summary (Last 24 hours) at 07/10/16 1435 Last data filed at 07/10/16 0906  Gross per 24 hour  Intake          2247.92 ml  Output             1600 ml  Net           647.92 ml   Filed Weights   07/06/16 0853  Weight: 82.6 kg (182 lb)     Exam  General: Well developed, well nourished, NAD, appears stated age  94: NCAT, mucous membranes moist.   Cardiovascular: S1 S2 auscultated, RRR, no murmurs  Respiratory: Diminished but clear  Abdomen: Soft, nontender, nondistended, + bowel sounds  Extremities: warm dry without cyanosis clubbing or edema  Neuro: AAOx3, nonfocal  Psych: Normal affect and demeanor , pleasant   Data Reviewed: I have personally reviewed following labs and imaging studies  CBC:  Recent Labs Lab 07/09/16 0415  WBC 9.5  HGB 12.9  HCT 39.3  MCV 95.2  PLT 474   Basic Metabolic Panel:  Recent Labs Lab 07/07/16 1623 07/08/16 0508 07/08/16 1600 07/08/16 1730 07/09/16 0415 07/10/16 1002  NA 141 136 133*  --  130* 129*  K 3.4* 3.7 3.3*  --  3.4* 4.0  CL 102 98* 93*  --  92* 94*  CO2 29 28 29   --  30 29  GLUCOSE 80 181* 174*  --  130* 181*  BUN 8 8 6   --  8 9  CREATININE 0.67 0.60 0.52  --  0.58 0.62  CALCIUM 8.7* 7.7* 7.9*  --  7.6* 7.1*  MG  --   --   --  1.6* 1.9 1.5*   GFR: Estimated Creatinine Clearance: 57.8 mL/min (by C-G formula based on SCr of 0.62 mg/dL). Liver Function Tests:  Recent Labs Lab 07/09/16 0415  AST 19  ALT 24  ALKPHOS 82  BILITOT 0.8  PROT 5.9*  ALBUMIN 3.0*   No results for input(s): LIPASE, AMYLASE in the last 168 hours. No results for input(s): AMMONIA in the last 168 hours. Coagulation Profile: No results for input(s): INR, PROTIME in the last 168 hours. Cardiac Enzymes: No results for input(s): CKTOTAL, CKMB, CKMBINDEX, TROPONINI in the last 168 hours. BNP (last 3 results) No results for input(s): PROBNP in the last 8760 hours. HbA1C: No results for input(s): HGBA1C in the last 72 hours. CBG:  Recent Labs Lab 07/09/16 1156 07/09/16 1727 07/09/16 2134 07/10/16 0737 07/10/16 1103  GLUCAP 133* 83 185* 143* 193*   Lipid Profile: No results for input(s): CHOL, HDL, LDLCALC, TRIG, CHOLHDL, LDLDIRECT in the last 72  hours. Thyroid Function Tests: No results for input(s): TSH, T4TOTAL, FREET4, T3FREE, THYROIDAB in the last 72 hours. Anemia Panel: No results for input(s): VITAMINB12, FOLATE, FERRITIN, TIBC, IRON, RETICCTPCT in the last 72 hours. Urine analysis:    Component Value Date/Time   COLORURINE YELLOW 07/23/2007 0932   APPEARANCEUR CLEAR 07/23/2007 0932   LABSPEC 1.018 07/23/2007 0932   PHURINE 7.5 07/23/2007 0932   GLUCOSEU NEGATIVE 07/23/2007 0932   HGBUR NEGATIVE 07/23/2007 0932   BILIRUBINUR NEGATIVE 07/23/2007 0932   KETONESUR NEGATIVE 07/23/2007 0932   PROTEINUR NEGATIVE 07/23/2007 0932   UROBILINOGEN 0.2 07/23/2007 0932   NITRITE NEGATIVE 07/23/2007 0932   LEUKOCYTESUR  07/23/2007 0932    NEGATIVE MICROSCOPIC NOT DONE  ON URINES WITH NEGATIVE PROTEIN, BLOOD, LEUKOCYTES, NITRITE, OR GLUCOSE <1000 mg/dL.   Sepsis Labs: @LABRCNTIP (procalcitonin:4,lacticidven:4)  ) Recent Results (from the past 240 hour(s))  Culture, blood (routine x 2) Call MD if unable to obtain prior to antibiotics being given     Status: None (Preliminary result)   Collection Time: 07/08/16  6:17 PM  Result Value Ref Range Status   Specimen Description BLOOD LEFT ANTECUBITAL  Final   Special Requests BOTTLES DRAWN AEROBIC AND ANAEROBIC 5CC  Final   Culture NO GROWTH 2 DAYS  Final   Report Status PENDING  Incomplete  Culture, blood (routine x 2) Call MD if unable to obtain prior to antibiotics being given     Status: None (Preliminary result)   Collection Time: 07/08/16  6:20 PM  Result Value Ref Range Status   Specimen Description BLOOD RIGHT ANTECUBITAL  Final   Special Requests BOTTLES DRAWN AEROBIC AND ANAEROBIC 5CC  Final   Culture NO GROWTH 2 DAYS  Final   Report Status PENDING  Incomplete      Radiology Studies: Dg Chest 2 View  Result Date: 07/10/2016 CLINICAL DATA:  Shortness of Breath EXAM: CHEST  2 VIEW COMPARISON:  07/08/2016 FINDINGS: Mild cardiomegaly. Low lung volumes with bibasilar  atelectasis. No effusions. No acute bony abnormality. IMPRESSION: Low lung volumes, bibasilar atelectasis.  Stable cardiomegaly. Electronically Signed   By: Rolm Baptise M.D.   On: 07/10/2016 09:17   Dg Chest 2 View  Result Date: 07/08/2016 CLINICAL DATA:  Fever, shortness of breath, wheezing EXAM: CHEST  2 VIEW COMPARISON:  10/02/2014 FINDINGS: Cardiomegaly. Consolidation at the right lung base. No focal opacity on the left. No effusions or acute bony abnormality. IMPRESSION: Opacity at the right lung base concerning for pneumonia. Cardiomegaly. Electronically Signed   By: Rolm Baptise M.D.   On: 07/08/2016 15:45     Scheduled Meds: . albuterol  2.5 mg Nebulization BID  . calcitRIOL  0.25 mcg Oral Daily  . calcium carbonate  2,000 mg of elemental calcium Oral QID  . carvedilol  6.25 mg Oral BID WC  . ceFEPime (MAXIPIME) IV  1 g Intravenous Q8H  . docusate sodium  100 mg Oral BID  . DULoxetine  30 mg Oral Daily  . enoxaparin (LOVENOX) injection  40 mg Subcutaneous Q24H  . feeding supplement  1 Container Oral BID BM  . fluticasone  2 spray Each Nare Daily  . insulin aspart  0-15 Units Subcutaneous TID WC  . levothyroxine  100 mcg Oral QAC breakfast  . lisinopril  10 mg Oral Daily  . loratadine  10 mg Oral QHS  . magnesium sulfate 1 - 4 g bolus IVPB  2 g Intravenous Once  . montelukast  10 mg Oral QPM  . pantoprazole  40 mg Oral Daily  . polyethylene glycol  17 g Oral Daily  . vancomycin  750 mg Intravenous Q12H   Continuous Infusions: . 0.9 % NaCl with KCl 40 mEq / L 50 mL/hr (07/09/16 0845)     LOS: 3 days   Time Spent in minutes   30 minutes  Annette Gross D.O. on 07/10/2016 at 2:35 PM  Between 7am to 7pm - Pager - [phone]  After 7pm go to [email or web address] - password [email or web address]  And look for the night coverage person covering for me after hours  [facility] Office  [phone]

## 2016-07-10 NOTE — Progress Notes (Signed)
Pharmacy Antibiotic Note  Annette Gross is a 75 y.o. female admitted on 07/06/2016 with pneumonia.  Pharmacy has been consulted for Vancomycin and Cefepime dosing x 8 days therapy.  Pt is afebrile with good uop and last WBC wnl.  Labs remain pending this AM.  Vancomycin dose not given last PM; dose due at change of shift.  Plan: Continue Vancomycin 750mg  IV q12, changing times to 6A and 6P Continue Cefepime 1g IV q8 F/U cx and trend clinical status Vancomycin trough at steady state  Height: 5' (152.4 cm) Weight: 182 lb (82.6 kg) IBW/kg (Calculated) : 45.5  Temp (24hrs), Avg:98.7 F (37.1 C), Min:98.2 F (36.8 C), Max:99 F (37.2 C)   Recent Labs Lab 07/07/16 0737 07/07/16 1623 07/08/16 0508 07/08/16 1600 07/09/16 0415  WBC  --   --   --   --  9.5  CREATININE 0.62 0.67 0.60 0.52 0.58    Estimated Creatinine Clearance: 57.8 mL/min (by C-G formula based on SCr of 0.58 mg/dL).    Allergies  Allergen Reactions  . Ciprofloxacin Other (See Comments)    Tendonitis  . Iohexol Anaphylaxis  . Ivp Dye [Iodinated Diagnostic Agents] Anaphylaxis  . Kiwi Extract Anaphylaxis  . Gabapentin Other (See Comments)    Auditory hallucinations  . Sulfa Antibiotics Hives    Covered legs  . Aspirin Other (See Comments)    Stomach pain  . Lexapro [Escitalopram Oxalate] Other (See Comments)    Muscle pain  . Lipitor [Atorvastatin] Other (See Comments)    Muscle pain  . Codeine Other (See Comments)    Severe stomach cramping.  . Diphenoxylate-Atropine Other (See Comments)    Severe stomach and dizziness    Antimicrobials this admission: Cefazolin 2gm IV pre-op on 3/5 Vanc 3/7>>(3/15) Cefepime 3/7>>(3/15)  Dose adjustments this admission: n/a  Microbiology results: 3/7 blood x 2 - 3/7 sputum -   Thank you for allowing pharmacy to be a part of this patient's care.  Gracy Bruins, PharmD Clinical Pharmacist [facility]

## 2016-07-10 NOTE — Progress Notes (Signed)
Discharge home. Home discharge instruction given, no question verbalized. 

## 2016-07-10 NOTE — Progress Notes (Signed)
Patient reports SOB after nasal cannula displaced and fell to floor. Audible inspiratory wheezing noted on assessment. Nasal cannula repositioned and PRN nebulizer treatment administered. Patient reports symptoms resolved post med neb.

## 2016-07-10 NOTE — Progress Notes (Signed)
General Surgery [facility]  Assessment & Plan:  POD#4 - status post total thyroidectomy  Papillary thyroid cancer  Post op hypocalcemia   Calcium carbonate 4X daily   Rocaltrol daily   Levels stabilized in 7.5-8.0 range  Asthma   requiring intermittent Albuterol nebulizer treatment  Hospital acquired pneumonia  HTN   On home meds  DM   CBGs under better control with SSI.  Patient appears stable.  Voice stronger.  Taking po diet without dysphagia.  OK for discharge home on calcium and Vit D supplements when stable from medical service standpoint.        Earnstine Regal, MD, [facility]       Office: [phone]    Subjective: Patient up at bedside, eating breakfast.  States voice is stronger.  Wants to shower.  Objective: Vital signs in last 24 hours: Temp:  [98.2 F (36.8 C)-99 F (37.2 C)] 98.2 F (36.8 C) (03/09 0905) Pulse Rate:  [81-83] 81 (03/09 0609) Resp:  [16-19] 18 (03/09 0609) BP: (118-149)/(57-71) 149/57 (03/09 0609) SpO2:  [95 %-100 %] 95 % (03/09 0905) Last BM Date: 07/09/16  Intake/Output from previous day: 03/08 0701 - 03/09 0700 In: 620 [P.O.:620] Out: 2090 [Urine:2090] Intake/Output this shift: Total I/O In: 1867.9 [I.V.:1417.9; IV Piggyback:450] Out: 300 [Urine:300]  Physical Exam: HEENT - sclerae clear, mucous membranes moist Neck - wound dry and intact; mild STS; mild hoarseness; no stridor Chest - clear bilaterally Cor - RRR Neuro - alert & oriented, no focal deficits  Lab Results:   Recent Labs  07/09/16 0415  WBC 9.5  HGB 12.9  HCT 39.3  PLT 317   BMET  Recent Labs  07/08/16 1600 07/09/16 0415  NA 133* 130*  K 3.3* 3.4*  CL 93* 92*  CO2 29 30  GLUCOSE 174* 130*  BUN 6 8  CREATININE 0.52 0.58  CALCIUM 7.9* 7.6*   PT/INR No results for input(s): LABPROT, INR in the last 72 hours. Comprehensive Metabolic Panel:    Component Value Date/Time   NA 130 (L)  07/09/2016 0415   NA 133 (L) 07/08/2016 1600   K 3.4 (L) 07/09/2016 0415   K 3.3 (L) 07/08/2016 1600   CL 92 (L) 07/09/2016 0415   CL 93 (L) 07/08/2016 1600   CO2 30 07/09/2016 0415   CO2 29 07/08/2016 1600   BUN 8 07/09/2016 0415   BUN 6 07/08/2016 1600   CREATININE 0.58 07/09/2016 0415   CREATININE 0.52 07/08/2016 1600   GLUCOSE 130 (H) 07/09/2016 0415   GLUCOSE 174 (H) 07/08/2016 1600   CALCIUM 7.6 (L) 07/09/2016 0415   CALCIUM 7.9 (L) 07/08/2016 1600   AST 19 07/09/2016 0415   AST 21 06/29/2016 1151   ALT 24 07/09/2016 0415   ALT 19 06/29/2016 1151   ALKPHOS 82 07/09/2016 0415   ALKPHOS 110 06/29/2016 1151   BILITOT 0.8 07/09/2016 0415   BILITOT 0.6 06/29/2016 1151   PROT 5.9 (L) 07/09/2016 0415   PROT 6.7 06/29/2016 1151   ALBUMIN 3.0 (L) 07/09/2016 0415   ALBUMIN 3.9 06/29/2016 1151    Studies/Results: Dg Chest 2 View  Result Date: 07/10/2016 CLINICAL DATA:  Shortness of Breath EXAM: CHEST  2 VIEW COMPARISON:  07/08/2016 FINDINGS: Mild cardiomegaly. Low lung volumes with bibasilar atelectasis. No effusions. No acute bony abnormality. IMPRESSION: Low lung volumes, bibasilar atelectasis.  Stable cardiomegaly. Electronically Signed   By: Rolm Baptise M.D.  On: 07/10/2016 09:17   Dg Chest 2 View  Result Date: 07/08/2016 CLINICAL DATA:  Fever, shortness of breath, wheezing EXAM: CHEST  2 VIEW COMPARISON:  10/02/2014 FINDINGS: Cardiomegaly. Consolidation at the right lung base. No focal opacity on the left. No effusions or acute bony abnormality. IMPRESSION: Opacity at the right lung base concerning for pneumonia. Cardiomegaly. Electronically Signed   By: Rolm Baptise M.D.   On: 07/08/2016 15:45      [place] M 07/10/2016  Patient ID: Annette Gross, female   DOB: May 14, 1940, 76 y.o.   MRN: [id number]

## 2016-07-10 NOTE — Progress Notes (Signed)
Patient reports nausea, PRN medication given.

## 2016-07-13 LAB — CULTURE, BLOOD (ROUTINE X 2)
CULTURE: NO GROWTH
Culture: NO GROWTH

## 2016-07-27 NOTE — Discharge Summary (Signed)
Physician Discharge Summary  Patient ID: Annette Gross MRN: [id number] DOB/AGE: December 13, 1940 76 y.o.  Admit date: 07/06/2016 Discharge date: 07/10/2016  Admission Diagnoses:  Highly dysplastic left thyroid nodule  Discharge Diagnoses: Papillary thyroid cancer Postoperative hypocalcemia Asthma Hypertension Diabetes mellitus Anxiety and depression History of factor V deficiency Postoperative nausea vomiting Pneumonia     Discharged Condition: good  Hospital Course: She was taken to the operating room the day of admission and underwent total thyroidectomy. Postoperatively she struggled with nausea and vomiting as well as hypocalcemia. Calcium levels went to the mid sevens but corrected were in the low 8 range. She was receiving oral calcium as well as Rocaltrol and intermittent IV calcium.  Postoperatively she had some and a productive cough. Chest x-ray suggested pneumonia and she started empirically on antibiotics. She was also started on albuterol treatments which helped her asthma. States she got her calcium under control. She was discharged to home on calcium and vitamin D supplements. She will follow-up in the office and have her calcium rechecked as a level stabilized between 7.5 and 8 range which corrected would be approximately 8.3-8.8. Consults: Internal medicine   Discharge Exam: Blood pressure 128/63, pulse 85, temperature 98.6 F (37 C), temperature source Oral, resp. rate 17, height 5' (1.524 m), weight 82.6 kg (182 lb), SpO2 94 %.   Disposition: 01-Home or Self Care  Discharge Instructions    Apply dressing    Complete by:  As directed    Apply light gauze dressing to wound before discharge home today.     Allergies as of 07/10/2016      Reactions   Ciprofloxacin Other (See Comments)   Tendonitis   Iohexol Anaphylaxis   Ivp Dye [iodinated Diagnostic Agents] Anaphylaxis   Kiwi Extract Anaphylaxis   Gabapentin Other (See Comments)   Auditory hallucinations   Sulfa  Antibiotics Hives   Covered legs   Aspirin Other (See Comments)   Stomach pain   Lexapro [escitalopram Oxalate] Other (See Comments)   Muscle pain   Lipitor [atorvastatin] Other (See Comments)   Muscle pain   Codeine Other (See Comments)   Severe stomach cramping.   Diphenoxylate-atropine Other (See Comments)   Severe stomach and dizziness      Medication List    TAKE these medications   acetaminophen 500 MG tablet Commonly known as:  TYLENOL Take 1,000 mg by mouth 2 (two) times daily.   albuterol 108 (90 Base) MCG/ACT inhaler Commonly known as:  PROVENTIL HFA;VENTOLIN HFA Inhale 2 puffs into the lungs every 4 (four) hours as needed for wheezing or shortness of breath.   amoxicillin-clavulanate 875-125 MG tablet Commonly known as:  AUGMENTIN Take 1 tablet by mouth 2 (two) times daily.   calcitRIOL 0.25 MCG capsule Commonly known as:  ROCALTROL Take 1 capsule (0.25 mcg total) by mouth daily.   calcium carbonate 1250 (500 Ca) MG tablet Commonly known as:  OS-CAL - dosed in mg of elemental calcium Take 4 tablets (2,000 mg of elemental calcium total) by mouth 4 (four) times daily.   carvedilol 6.25 MG tablet Commonly known as:  COREG Take 1 tablet (6.25 mg total) by mouth 2 (two) times daily with a meal.   docusate sodium 100 MG capsule Commonly known as:  COLACE Take 100 mg by mouth 2 (two) times daily as needed for constipation.   DULoxetine 30 MG capsule Commonly known as:  CYMBALTA Take 30 mg by mouth daily.   EPINEPHrine 0.3 mg/0.3 mL Soaj injection Commonly known  as:  EPI-PEN Inject 0.3 mg into the muscle as needed.   feeding supplement Liqd Take 1 Container by mouth 2 (two) times daily between meals.   fluticasone 50 MCG/ACT nasal spray Commonly known as:  FLONASE Place 2 sprays into the nose daily.   levocetirizine 5 MG tablet Commonly known as:  XYZAL Take 5 mg by mouth every evening.   levothyroxine 25 MCG tablet Commonly known as:  SYNTHROID,  LEVOTHROID Take 25 mcg by mouth daily before breakfast.   lisinopril 10 MG tablet Commonly known as:  PRINIVIL,ZESTRIL Take 1 tablet (10 mg total) by mouth daily.   metFORMIN 500 MG tablet Commonly known as:  GLUCOPHAGE Take 1 tablet (500 mg total) by mouth daily with breakfast.   montelukast 10 MG tablet Commonly known as:  SINGULAIR Take 10 mg by mouth every evening.   MULTIVITAMIN ADULT Chew Chew 2 each by mouth daily.   multivitamin tablet Take 1 tablet by mouth daily.   omeprazole 40 MG capsule Commonly known as:  PRILOSEC Take 40 mg by mouth daily.   SYSTANE BALANCE OP Place 1 drop into both eyes daily as needed (dry eyes).   Vitamin D3 2000 units Tabs Take 4,000 Units by mouth daily.      Follow-up Information    Aundrey Elahi J, MD. Schedule an appointment as soon as possible for a visit in 3 week(s).   Specialty:  General Surgery Contact information: [address] [place] [address] [phone]        Irven Shelling, MD. Schedule an appointment as soon as possible for a visit in 1 week(s).   Specialty:  Internal Medicine Why:  Hospital follow up, Discuss blood pressure and diabetes.  Have labs drawn- BMP, Magnesium,  Contact information: [address] [place] [address] [phone]           Signed: Odis Hollingshead 07/27/2016, 7:32 AM

## 2016-09-04 ENCOUNTER — Other Ambulatory Visit: Payer: Self-pay | Admitting: Internal Medicine

## 2016-09-04 DIAGNOSIS — C73 Malignant neoplasm of thyroid gland: Secondary | ICD-10-CM

## 2016-09-11 ENCOUNTER — Ambulatory Visit
Admission: RE | Admit: 2016-09-11 | Discharge: 2016-09-11 | Disposition: A | Discharge status: Home / Self Care | Payer: Medicare Other | Source: Ambulatory Visit | Attending: Internal Medicine | Admitting: Internal Medicine

## 2016-09-11 DIAGNOSIS — C73 Malignant neoplasm of thyroid gland: Secondary | ICD-10-CM

## 2016-10-12 ENCOUNTER — Other Ambulatory Visit (HOSPITAL_COMMUNITY): Payer: Self-pay | Admitting: Internal Medicine

## 2016-10-12 DIAGNOSIS — C73 Malignant neoplasm of thyroid gland: Secondary | ICD-10-CM

## 2016-10-13 ENCOUNTER — Other Ambulatory Visit (HOSPITAL_COMMUNITY): Payer: Self-pay | Admitting: Internal Medicine

## 2016-10-13 DIAGNOSIS — C73 Malignant neoplasm of thyroid gland: Secondary | ICD-10-CM

## 2016-12-28 ENCOUNTER — Other Ambulatory Visit (HOSPITAL_COMMUNITY): Payer: Medicare Other

## 2016-12-28 ENCOUNTER — Encounter (HOSPITAL_COMMUNITY): Payer: Self-pay

## 2016-12-29 ENCOUNTER — Other Ambulatory Visit (HOSPITAL_COMMUNITY): Payer: Medicare Other

## 2016-12-30 ENCOUNTER — Other Ambulatory Visit (HOSPITAL_COMMUNITY): Payer: Medicare Other

## 2016-12-30 ENCOUNTER — Encounter (HOSPITAL_COMMUNITY)
Admission: RE | Admit: 2016-12-30 | Discharge: 2016-12-30 | Disposition: A | Discharge status: Home / Self Care | Payer: Medicare Other | Source: Ambulatory Visit | Attending: Internal Medicine | Admitting: Internal Medicine

## 2016-12-30 DIAGNOSIS — C73 Malignant neoplasm of thyroid gland: Secondary | ICD-10-CM | POA: Insufficient documentation

## 2016-12-30 MED ORDER — THYROTROPIN ALFA 1.1 MG IM SOLR
INTRAMUSCULAR | Status: AC
  Filled 2016-12-30: qty 0.9

## 2016-12-30 MED ORDER — THYROTROPIN ALFA 1.1 MG IM SOLR
0.9000 mg | INTRAMUSCULAR | Status: AC
  Administered 2016-12-30: 0.9 mg via INTRAMUSCULAR

## 2016-12-31 ENCOUNTER — Encounter (HOSPITAL_COMMUNITY)
Admission: RE | Admit: 2016-12-31 | Discharge: 2016-12-31 | Disposition: A | Discharge status: Home / Self Care | Payer: Medicare Other | Source: Ambulatory Visit | Attending: Internal Medicine | Admitting: Internal Medicine

## 2016-12-31 DIAGNOSIS — C73 Malignant neoplasm of thyroid gland: Secondary | ICD-10-CM | POA: Diagnosis not present

## 2016-12-31 MED ORDER — THYROTROPIN ALFA 1.1 MG IM SOLR
0.9000 mg | INTRAMUSCULAR | Status: AC
  Administered 2016-12-31: 0.9 mg via INTRAMUSCULAR

## 2016-12-31 MED ORDER — THYROTROPIN ALFA 1.1 MG IM SOLR
INTRAMUSCULAR | Status: AC
  Filled 2016-12-31: qty 0.9

## 2017-01-01 ENCOUNTER — Other Ambulatory Visit (HOSPITAL_COMMUNITY): Payer: Medicare Other

## 2017-01-01 ENCOUNTER — Encounter (HOSPITAL_COMMUNITY)
Admission: RE | Admit: 2017-01-01 | Discharge: 2017-01-01 | Disposition: A | Discharge status: Home / Self Care | Payer: Medicare Other | Source: Ambulatory Visit | Attending: Internal Medicine | Admitting: Internal Medicine

## 2017-01-01 DIAGNOSIS — C73 Malignant neoplasm of thyroid gland: Secondary | ICD-10-CM | POA: Diagnosis not present

## 2017-01-01 MED ORDER — SODIUM IODIDE I 131 CAPSULE
124.3000 | Freq: Once | INTRAVENOUS | Status: AC | PRN
  Administered 2017-01-01: 124.3 via ORAL

## 2017-01-11 ENCOUNTER — Encounter (HOSPITAL_COMMUNITY)
Admission: RE | Admit: 2017-01-11 | Discharge: 2017-01-11 | Disposition: A | Discharge status: Home / Self Care | Payer: Medicare Other | Source: Ambulatory Visit | Attending: Internal Medicine | Admitting: Internal Medicine

## 2017-01-11 DIAGNOSIS — C73 Malignant neoplasm of thyroid gland: Secondary | ICD-10-CM | POA: Insufficient documentation

## 2017-08-13 ENCOUNTER — Encounter: Payer: Self-pay | Admitting: Podiatry

## 2017-08-13 ENCOUNTER — Ambulatory Visit: Payer: Medicare Other | Admitting: Podiatry

## 2017-08-13 DIAGNOSIS — M129 Arthropathy, unspecified: Secondary | ICD-10-CM

## 2017-08-13 DIAGNOSIS — M79675 Pain in left toe(s): Secondary | ICD-10-CM | POA: Diagnosis not present

## 2017-08-13 DIAGNOSIS — B351 Tinea unguium: Secondary | ICD-10-CM

## 2017-08-13 DIAGNOSIS — M79674 Pain in right toe(s): Secondary | ICD-10-CM | POA: Diagnosis not present

## 2017-08-13 DIAGNOSIS — M205X9 Other deformities of toe(s) (acquired), unspecified foot: Secondary | ICD-10-CM

## 2017-08-13 DIAGNOSIS — M2020 Hallux rigidus, unspecified foot: Secondary | ICD-10-CM

## 2017-08-13 NOTE — Progress Notes (Signed)
This patient presents to the office with chief complaint of long thick nails and diabetic feet.  This patient  says he is having no pain and discomfort in his feet.  This patient says she  has long thick painful nails.  These nails are painful walking and wearing shoes.  She has no history of infection or drainage from both feet.  This patient presents the office today for treatment of the  long nails and a foot evaluation due to history of  Diabetes. Patient has diabetes and is diet controlled.  General Appearance  Alert, conversant and in no acute stress.  Vascular  Dorsalis pedis and posterior tibial  pulses are palpable  bilaterally.  Capillary return is within normal limits  bilaterally. Temperature is within normal limits  bilaterally.  Neurologic  Senn-Weinstein monofilament wire test within normal limits  bilaterally. Muscle power within normal limits bilaterally.  Nails Thick disfigured discolored nails with subungual debris  from hallux to fifth toes bilaterally. No evidence of bacterial infection or drainage bilaterally.  Orthopedic  No limitations of motion of motion feet .  No crepitus or effusions noted.  DJD 1st MPJ  B/L.  DJD 1st MCJ  B/L  Overlapping second digit left foot.  Tailors bunion left foot.  Skin  normotropic skin with no porokeratosis noted bilaterally.  No signs of infections or ulcers noted.     Onychomycosis  Diabetes with no foot complications  IE  Debride nails x 10.  A diabetic foot exam was performed and there is no evidence of any vascular or neurologic pathology.   RTC 3 months.   Gardiner Barefoot DPM

## 2017-11-12 ENCOUNTER — Ambulatory Visit: Payer: Medicare Other | Admitting: Podiatry

## 2017-12-01 ENCOUNTER — Ambulatory Visit: Payer: Medicare Other | Admitting: Podiatry

## 2017-12-01 ENCOUNTER — Encounter: Payer: Self-pay | Admitting: Podiatry

## 2017-12-01 ENCOUNTER — Ambulatory Visit (INDEPENDENT_AMBULATORY_CARE_PROVIDER_SITE_OTHER): Payer: Medicare Other

## 2017-12-01 DIAGNOSIS — M76821 Posterior tibial tendinitis, right leg: Secondary | ICD-10-CM

## 2017-12-01 DIAGNOSIS — M129 Arthropathy, unspecified: Secondary | ICD-10-CM

## 2017-12-01 DIAGNOSIS — M2141 Flat foot [pes planus] (acquired), right foot: Secondary | ICD-10-CM

## 2017-12-01 DIAGNOSIS — M76822 Posterior tibial tendinitis, left leg: Secondary | ICD-10-CM

## 2017-12-01 NOTE — Progress Notes (Signed)
His patient presents the office with chief complaint of painful right foot.  She says she's had pain and discomfort in her foot for over 20 years.  She states that her foot is very flat when she walks.  She says that her foot becomes painful after activity.  She does have a history of diabetes.  She presents the office today for an evaluation and treatment of her right foot.  General Appearance  Alert, conversant and in no acute stress.  Vascular  Dorsalis pedis and posterior tibial  pulses are palpable  bilaterally.  Capillary return is within normal limits  bilaterally. Temperature is within normal limits  bilaterally.  Neurologic  Senn-Weinstein monofilament wire test within normal limits  bilaterally. Muscle power within normal limits bilaterally.  Nails Thick disfigured discolored nails with subungual debris  from hallux to fifth toes bilaterally. No evidence of bacterial infection or drainage bilaterally.  Orthopedic  No limitations of motion of motion feet .  No crepitus or effusions noted.  No bony pathology or digital deformities noted.  Skin  normotropic skin with no porokeratosis noted bilaterally.  No signs of infections or ulcers noted.  DJD 1st MPJ  B/L.  DJD Liz-Frank feet  B/L with right foot greater than left foot. Overlapping second digit left foot.  Tailors bunion, left foot.   Chronic arthropathy B/L  Pes planus  B/l  ROV.  X-rays reveal significant arthritis in the midfoot, right foot. Healed fracture second metatarsal left foot.  The talus is significantly plantarflexed leading  to the pes planus foot type.  Patient states that her pain is 1 out of 10 and that she is not interested in any medication or injection therapy today.  He is to return to the office when necessary.   Gardiner Barefoot DPM

## 2018-04-20 ENCOUNTER — Other Ambulatory Visit: Payer: Self-pay | Admitting: Internal Medicine

## 2018-04-20 DIAGNOSIS — R109 Unspecified abdominal pain: Secondary | ICD-10-CM

## 2018-04-20 DIAGNOSIS — R110 Nausea: Secondary | ICD-10-CM

## 2018-04-20 DIAGNOSIS — K219 Gastro-esophageal reflux disease without esophagitis: Secondary | ICD-10-CM

## 2018-04-28 ENCOUNTER — Ambulatory Visit
Admission: RE | Admit: 2018-04-28 | Discharge: 2018-04-28 | Disposition: A | Discharge status: Home / Self Care | Payer: Medicare Other | Source: Ambulatory Visit | Attending: Internal Medicine | Admitting: Internal Medicine

## 2018-04-28 DIAGNOSIS — K219 Gastro-esophageal reflux disease without esophagitis: Secondary | ICD-10-CM

## 2018-04-28 DIAGNOSIS — R109 Unspecified abdominal pain: Secondary | ICD-10-CM

## 2018-04-28 DIAGNOSIS — R110 Nausea: Secondary | ICD-10-CM

## 2019-03-08 ENCOUNTER — Other Ambulatory Visit: Payer: Self-pay

## 2019-03-08 DIAGNOSIS — Z20822 Contact with and (suspected) exposure to covid-19: Secondary | ICD-10-CM

## 2019-03-09 LAB — NOVEL CORONAVIRUS, NAA: SARS-CoV-2, NAA: NOT DETECTED

## 2019-08-17 ENCOUNTER — Encounter (INDEPENDENT_AMBULATORY_CARE_PROVIDER_SITE_OTHER): Payer: Medicare Other | Admitting: Ophthalmology

## 2019-08-29 ENCOUNTER — Other Ambulatory Visit: Payer: Self-pay

## 2019-08-29 ENCOUNTER — Ambulatory Visit (INDEPENDENT_AMBULATORY_CARE_PROVIDER_SITE_OTHER): Payer: Medicare Other | Admitting: Ophthalmology

## 2019-08-29 ENCOUNTER — Encounter (INDEPENDENT_AMBULATORY_CARE_PROVIDER_SITE_OTHER): Payer: Self-pay | Admitting: Ophthalmology

## 2019-08-29 DIAGNOSIS — H2512 Age-related nuclear cataract, left eye: Secondary | ICD-10-CM

## 2019-08-29 DIAGNOSIS — E113391 Type 2 diabetes mellitus with moderate nonproliferative diabetic retinopathy without macular edema, right eye: Secondary | ICD-10-CM | POA: Diagnosis not present

## 2019-08-29 DIAGNOSIS — H26491 Other secondary cataract, right eye: Secondary | ICD-10-CM | POA: Diagnosis not present

## 2019-08-29 DIAGNOSIS — H353131 Nonexudative age-related macular degeneration, bilateral, early dry stage: Secondary | ICD-10-CM

## 2019-08-29 HISTORY — DX: Age-related nuclear cataract, left eye: H25.12

## 2019-08-29 NOTE — Assessment & Plan Note (Signed)
The nature of moderate nonproliferative diabetic retinopathy was discussed with the patient as well as the need for more frequent follow up to judge for progression. Good blood glucose, blood pressure, and serum lipid control was recommended as well as avoidance of smoking and maintenance of normal weight.  Close follow up with PCP encouraged.The nature of moderate nonproliferative diabetic retinopathy was discussed with the patient as well as the need for more frequent follow up to judge for progression. Good blood glucose, blood pressure, and serum lipid control was recommended as well as avoidance of smoking and maintenance of normal weight.  Close follow up with PCP encouraged.

## 2019-08-29 NOTE — Assessment & Plan Note (Signed)

## 2019-08-29 NOTE — Progress Notes (Signed)
08/29/2019     CHIEF COMPLAINT Patient presents for Retina Follow Up   HISTORY OF PRESENT ILLNESS: Annette Gross is a 79 y.o. female who presents to the clinic today for:   HPI    Retina Follow Up    Patient presents with  Diabetic Retinopathy.  In left eye.  This started 1 year ago.  Severity is moderate.  Duration of 1 year.  Since onset it is gradually worsening.          Comments    1 Year Diabetic F/U OU  Pt c/o worsening VA OS due to cataract progression. No ocular pain, flashes, or floaters OU.  LBS: pt does not recall A1c: 8.0, 01.2021       Last edited by Rockie Neighbours, [place] on 08/29/2019 10:01 AM. (History)      Referring physician: Lavone Orn, MD [place] [place],  [place] [address]  HISTORICAL INFORMATION:   Selected notes from the MEDICAL RECORD NUMBER    Lab Results  Component Value Date   HGBA1C 7.3 (H) 06/29/2016     CURRENT MEDICATIONS: Current Outpatient Medications (Ophthalmic Drugs)  Medication Sig  . Propylene Glycol (SYSTANE BALANCE OP) Place 1 drop into both eyes daily as needed (dry eyes).   No current facility-administered medications for this visit. (Ophthalmic Drugs)   Current Outpatient Medications (Other)  Medication Sig  . acetaminophen (TYLENOL) 500 MG tablet Take 1,000 mg by mouth 2 (two) times daily.  Marland Kitchen albuterol (PROVENTIL HFA;VENTOLIN HFA) 108 (90 Base) MCG/ACT inhaler Inhale 2 puffs into the lungs every 4 (four) hours as needed for wheezing or shortness of breath.  Marland Kitchen amoxicillin-clavulanate (AUGMENTIN) 875-125 MG tablet Take 1 tablet by mouth 2 (two) times daily. (Patient not taking: Reported on 08/13/2017)  . calcitRIOL (ROCALTROL) 0.25 MCG capsule Take 1 capsule (0.25 mcg total) by mouth daily.  . calcium carbonate (OS-CAL - DOSED IN MG OF ELEMENTAL CALCIUM) 1250 (500 Ca) MG tablet Take 4 tablets (2,000 mg of elemental calcium total) by mouth 4 (four) times daily. (Patient not taking: Reported on  08/13/2017)  . carvedilol (COREG) 6.25 MG tablet Take 1 tablet (6.25 mg total) by mouth 2 (two) times daily with a meal. (Patient not taking: Reported on 08/13/2017)  . Cholecalciferol (VITAMIN D3) 2000 units TABS Take 4,000 Units by mouth daily.  Marland Kitchen docusate sodium (COLACE) 100 MG capsule Take 100 mg by mouth 2 (two) times daily as needed for constipation.  . DULoxetine (CYMBALTA) 30 MG capsule Take 30 mg by mouth daily.  Marland Kitchen EPINEPHrine 0.3 mg/0.3 mL IJ SOAJ injection Inject 0.3 mg into the muscle as needed.  . feeding supplement (BOOST / RESOURCE BREEZE) LIQD Take 1 Container by mouth 2 (two) times daily between meals. (Patient not taking: Reported on 08/13/2017)  . fluticasone (FLONASE) 50 MCG/ACT nasal spray Place 2 sprays into the nose daily.   Marland Kitchen levocetirizine (XYZAL) 5 MG tablet Take 5 mg by mouth every evening.  Marland Kitchen levothyroxine (SYNTHROID, LEVOTHROID) 25 MCG tablet Take 25 mcg by mouth daily before breakfast.  . lisinopril (PRINIVIL,ZESTRIL) 10 MG tablet Take 1 tablet (10 mg total) by mouth daily. (Patient not taking: Reported on 08/13/2017)  . metFORMIN (GLUCOPHAGE) 500 MG tablet Take 1 tablet (500 mg total) by mouth daily with breakfast. (Patient not taking: Reported on 08/13/2017)  . montelukast (SINGULAIR) 10 MG tablet Take 10 mg by mouth every evening.  . Multiple Vitamin (MULTIVITAMIN) tablet Take 1 tablet by mouth daily.    Marland Kitchen  Multiple Vitamins-Minerals (MULTIVITAMIN ADULT) CHEW Chew 2 each by mouth daily.  Marland Kitchen omeprazole (PRILOSEC) 40 MG capsule Take 40 mg by mouth daily.     No current facility-administered medications for this visit. (Other)      REVIEW OF SYSTEMS:    ALLERGIES Allergies  Allergen Reactions  . Ciprofloxacin Other (See Comments)    Tendonitis  . Iohexol Anaphylaxis  . Ivp Dye [Iodinated Diagnostic Agents] Anaphylaxis  . Kiwi Extract Anaphylaxis  . Gabapentin Other (See Comments)    Auditory hallucinations  . Sulfa Antibiotics Hives    Covered legs  .  Aspirin Other (See Comments)    Stomach pain  . Lexapro [Escitalopram Oxalate] Other (See Comments)    Muscle pain  . Lipitor [Atorvastatin] Other (See Comments)    Muscle pain  . Codeine Other (See Comments)    Severe stomach cramping.  . Diphenoxylate-Atropine Other (See Comments)    Severe stomach and dizziness    PAST MEDICAL HISTORY Past Medical History:  Diagnosis Date  . Anemia    many yrs ago  . Anxiety   . Asthma    no recent issues  . Complication of anesthesia   . Cystitis, interstitial   . Degenerative disc disease, lumbar   . Degenerative joint disease    spinal stenosis "Chronic low back pain"  . Depression   . Diabetes mellitus    diet control.  . Diverticulitis 2005  . Diverticulosis 2005  . Factor V deficiency ([place])   . Fibromyalgia   . Fracture of right foot 1995  . GERD (gastroesophageal reflux disease)   . History of hiatal hernia   . Hyperlipidemia   . Hypertension    off med x 5 yrs.  . IBS (irritable bowel syndrome)   . Insomnia   . Iron deficiency   . Liver hemangioma 1999   MRI  . Mononucleosis   . Morton's neuroma    Left foot  . Peripheral neuropathy   . Pneumonia    years 65 & 45.  None since  . PONV (postoperative nausea and vomiting)    nausea no vomiting  . Rectocele   . Transfusion history    child x2, many yrs ago after childbirth"hemorrhage"  . Umbilical hernia 0000000   Past Surgical History:  Procedure Laterality Date  . blood vessel tumor removal  1980   from chin  . CHOLECYSTECTOMY    . COLONOSCOPY WITH PROPOFOL N/A 09/02/2015   Procedure: COLONOSCOPY WITH PROPOFOL;  Surgeon: Garlan Fair, MD;  Location: [facility];  Service: Endoscopy;  Laterality: N/A;  . GANGLION CYST EXCISION     right  . HEMORRHOID SURGERY    . HERNIA REPAIR     repair was aimed at the [facility]  . HIATAL HERNIA REPAIR     and nissen fundoplication  . LAPAROSCOPIC ESOPHAGOGASTRIC FUNDOPLASTY    . LAPAROSCOPIC INCISIONAL / UMBILICAL / VENTRAL  HERNIA REPAIR     umbilical hernia  . OOPHORECTOMY     left  . RIGHT OOPHORECTOMY     '05-laparaoscopic  . SHOULDER SURGERY Right   . THYROIDECTOMY N/A 07/06/2016   Procedure: TOTAL THYROIDECTOMY;  Surgeon: Jackolyn Confer, MD;  Location: [facility];  Service: General;  Laterality: N/A;  . TONSILLECTOMY    . TOTAL THYROIDECTOMY  07/06/2016    FAMILY HISTORY Family History  Problem Relation Age of Onset  . Stroke Father   . Heart disease Father     SOCIAL HISTORY Social History  Tobacco Use  . Smoking status: Never Smoker  . Smokeless tobacco: Never Used  Substance Use Topics  . Alcohol use: Yes    Alcohol/week: 2.0 standard drinks    Types: 2 Glasses of wine per week    Comment: social  . Drug use: No         OPHTHALMIC EXAM:  Base Eye Exam    Visual Acuity (ETDRS)      Right Left   Dist [place] 20/25 -2 20/30 +1   Dist ph [place]  20/25 +2       Tonometry (Tonopen, 10:05 AM)      Right Left   Pressure 17 16       Pupils      Pupils Dark Light Shape React APD   Right PERRL 4 3 Round Brisk None   Left PERRL 4 3 Round Brisk None       Visual Fields (Counting fingers)      Left Right    Full Full       Extraocular Movement      Right Left    Full Full       Neuro/Psych    Oriented x3: Yes   Mood/Affect: Normal       Dilation    Both eyes: 1.0% Mydriacyl, 2.5% Phenylephrine @ 10:05 AM        Slit Lamp and Fundus Exam    External Exam      Right Left   External Normal Normal       Slit Lamp Exam      Right Left   Lids/Lashes Normal Normal   Conjunctiva/Sclera White and quiet White and quiet   Cornea Clear Clear   Anterior Chamber Deep and quiet Deep and quiet   Iris Round and reactive Round and reactive   Lens Posterior chamber intraocular lens 2+ Nuclear sclerosis   Anterior Vitreous Normal Normal       Fundus Exam      Right Left   Posterior Vitreous Normal Normal   Disc Normal Normal   C/D Ratio 0.2 0.3   Macula Hard drusen, no macular  thickening Hard drusen, no macular thickening   Vessels NPDR- Mild NPDR- Mild   Periphery Normal Normal          IMAGING AND PROCEDURES  Imaging and Procedures for 08/29/19  OCT, Retina - OU - Both Eyes       Right Eye Quality was good. Scan locations included subfoveal. Central Foveal Thickness: 310. Progression has been stable. Findings include normal observations, retinal drusen .   Left Eye Quality was good. Scan locations included subfoveal. Central Foveal Thickness: 305. Progression has been stable. Findings include retinal drusen .   Notes No active maculopathy                ASSESSMENT/PLAN:  Moderate nonproliferative diabetic retinopathy of right eye (HCC) The nature of moderate nonproliferative diabetic retinopathy was discussed with the patient as well as the need for more frequent follow up to judge for progression. Good blood glucose, blood pressure, and serum lipid control was recommended as well as avoidance of smoking and maintenance of normal weight.  Close follow up with PCP encouraged.The nature of moderate nonproliferative diabetic retinopathy was discussed with the patient as well as the need for more frequent follow up to judge for progression. Good blood glucose, blood pressure, and serum lipid control was recommended as well as avoidance of smoking and maintenance of normal weight.  Close follow up with PCP encouraged.  Early stage nonexudative age-related macular degeneration of both eyes The nature of dry age related macular degeneration was discussed with the patient as well as its possible conversion to wet. The results of the AREDS 2 study was discussed with the patient. A diet rich in dark leafy green vegetables was advised and specific recommendations were made regarding supplements with AREDS 2 formulation . Control of hypertension and serum cholesterol may slow the disease. Smoking cessation is mandatory to slow the disease and diminish the risk  of progressing to wet age related macular degeneration. The patient was instructed in the use of an [place] and was told to return immediately for any changes in the Grid. Stressed to the patient do not rub eyes      ICD-10-CM   1. Moderate nonproliferative diabetic retinopathy of right eye without macular edema associated with type 2 diabetes mellitus (HCC)  E11.3391 OCT, Retina - OU - Both Eyes  2. Early stage nonexudative age-related macular degeneration of both eyes  H35.3131   3. Right posterior capsular opacification  H26.491   4. Nuclear sclerotic cataract of left eye  H25.12     1.  2.  3.  Ophthalmic Meds Ordered this visit:  No orders of the defined types were placed in this encounter.      No follow-ups on file.  There are no Patient Instructions on file for this visit.   Explained the diagnoses, plan, and follow up with the patient and they expressed understanding.  Patient expressed understanding of the importance of proper follow up care.   Clent Demark Brittanya Winburn M.D. Diseases & Surgery of the Retina and Vitreous [facility] [place] 08/29/19     Abbreviations: M myopia (nearsighted); A astigmatism; H hyperopia (farsighted); P presbyopia; Mrx spectacle prescription;  CTL contact lenses; OD right eye; OS left eye; OU both eyes  XT exotropia; ET esotropia; PEK punctate epithelial keratitis; PEE punctate epithelial erosions; DES dry eye syndrome; MGD meibomian gland dysfunction; ATs artificial tears; PFAT's preservative free artificial tears; [place] nuclear sclerotic cataract; PSC posterior subcapsular cataract; ERM epi-retinal membrane; PVD posterior vitreous detachment; RD retinal detachment; DM diabetes mellitus; DR diabetic retinopathy; NPDR non-proliferative diabetic retinopathy; PDR proliferative diabetic retinopathy; CSME clinically significant macular edema; DME diabetic macular edema; dbh dot blot hemorrhages; CWS cotton wool spot; POAG primary open angle  glaucoma; C/D cup-to-disc ratio; HVF humphrey visual field; GVF goldmann visual field; OCT optical coherence tomography; IOP intraocular pressure; BRVO Branch retinal vein occlusion; CRVO central retinal vein occlusion; CRAO central retinal artery occlusion; BRAO branch retinal artery occlusion; RT retinal tear; SB scleral buckle; PPV pars plana vitrectomy; VH Vitreous hemorrhage; PRP panretinal laser photocoagulation; IVK intravitreal kenalog; VMT vitreomacular traction; MH Macular hole;  NVD neovascularization of the disc; NVE neovascularization elsewhere; AREDS age related eye disease study; ARMD age related macular degeneration; POAG primary open angle glaucoma; EBMD epithelial/anterior basement membrane dystrophy; ACIOL anterior chamber intraocular lens; IOL intraocular lens; PCIOL posterior chamber intraocular lens; Phaco/IOL phacoemulsification with intraocular lens placement; [place] photorefractive keratectomy; LASIK laser assisted in situ keratomileusis; HTN hypertension; DM diabetes mellitus; COPD chronic obstructive pulmonary disease

## 2020-03-05 ENCOUNTER — Ambulatory Visit: Payer: Medicare Other | Admitting: Physical Therapy

## 2020-03-19 ENCOUNTER — Ambulatory Visit: Payer: Medicare Other | Attending: Internal Medicine | Admitting: Physical Therapy

## 2020-03-19 ENCOUNTER — Other Ambulatory Visit: Payer: Self-pay

## 2020-03-19 ENCOUNTER — Encounter: Payer: Self-pay | Admitting: Physical Therapy

## 2020-03-19 DIAGNOSIS — M5441 Lumbago with sciatica, right side: Secondary | ICD-10-CM | POA: Insufficient documentation

## 2020-03-19 DIAGNOSIS — G8929 Other chronic pain: Secondary | ICD-10-CM | POA: Insufficient documentation

## 2020-03-19 DIAGNOSIS — M6281 Muscle weakness (generalized): Secondary | ICD-10-CM | POA: Diagnosis present

## 2020-03-19 DIAGNOSIS — M4125 Other idiopathic scoliosis, thoracolumbar region: Secondary | ICD-10-CM | POA: Diagnosis present

## 2020-03-19 DIAGNOSIS — M5442 Lumbago with sciatica, left side: Secondary | ICD-10-CM | POA: Diagnosis present

## 2020-03-19 DIAGNOSIS — R2689 Other abnormalities of gait and mobility: Secondary | ICD-10-CM | POA: Diagnosis not present

## 2020-03-20 NOTE — Therapy (Signed)
[place], [place], [address] Phone: [phone]   Fax:  [phone]  Physical Therapy Evaluation  Patient Details  Name: Annette Gross MRN: [id number] Date of Birth: Jan 19, 1941 Referring Provider (PT): Lavone Orn, MD   Encounter Date: 03/19/2020   PT End of Session - 03/20/20 1326    Visit Number 1    Number of Visits 12    Date for PT Re-Evaluation 04/30/20    Authorization Type [employer] Medicare, progress note by visit 10    PT Start Time 1500    PT Stop Time 1546    PT Time Calculation (min) 46 min    Activity Tolerance Patient tolerated treatment well    Behavior During Therapy [facility] for tasks assessed/performed           Past Medical History:  Diagnosis Date  . Anemia    many yrs ago  . Anxiety   . Asthma    no recent issues  . Cancer [facility])    s/p thyroidectomy 2018  . Complication of anesthesia   . Cystitis, interstitial   . Degenerative disc disease, lumbar   . Degenerative joint disease    spinal stenosis "Chronic low back pain"  . Depression   . Diabetes mellitus    diet control.  . Diverticulitis 2005  . Diverticulosis 2005  . Factor V deficiency ([place])   . Fibromyalgia   . Fracture of right foot 1995  . GERD (gastroesophageal reflux disease)   . History of hiatal hernia   . Hyperlipidemia   . Hypertension    off med x 5 yrs.  . IBS (irritable bowel syndrome)   . Insomnia   . Iron deficiency   . Liver hemangioma 1999   MRI  . Mononucleosis   . Morton's neuroma    Left foot  . Peripheral neuropathy   . Pneumonia    years 37 & 89.  None since  . PONV (postoperative nausea and vomiting)    nausea no vomiting  . Rectocele   . Transfusion history    child x2, many yrs ago after childbirth"hemorrhage"  . Umbilical hernia 3016    Past Surgical History:  Procedure Laterality Date  . blood vessel tumor removal  1980   from chin  . CHOLECYSTECTOMY    . COLONOSCOPY WITH  PROPOFOL N/A 09/02/2015   Procedure: COLONOSCOPY WITH PROPOFOL;  Surgeon: Garlan Fair, MD;  Location: [facility];  Service: Endoscopy;  Laterality: N/A;  . GANGLION CYST EXCISION     right  . HEMORRHOID SURGERY    . HERNIA REPAIR     repair was aimed at the [facility]  . HIATAL HERNIA REPAIR     and nissen fundoplication  . LAPAROSCOPIC ESOPHAGOGASTRIC FUNDOPLASTY    . LAPAROSCOPIC INCISIONAL / UMBILICAL / VENTRAL HERNIA REPAIR     umbilical hernia  . OOPHORECTOMY     left  . RIGHT OOPHORECTOMY     '05-laparaoscopic  . SHOULDER SURGERY Right   . THYROIDECTOMY N/A 07/06/2016   Procedure: TOTAL THYROIDECTOMY;  Surgeon: Jackolyn Confer, MD;  Location: [place];  Service: General;  Laterality: N/A;  . TONSILLECTOMY    . TOTAL THYROIDECTOMY  07/06/2016    There were no vitals filed for this visit.    Subjective Assessment - 03/20/20 1312    Subjective Pt. is a 79 y/o female referred to PT for gait and imbalance issues as well as chronic LBP with underlying scoliosis. For her  back she reports a history of chronic LBP dating back to the early 1970s after the birth of her 59rd child. Over the last year she reports began to develop bilat. left>right LE radiating pain primarily with standing and walking and eased with rest. No recent imaging but reports diagnosed with scoliosis about 15 years ago and has past imaging findings of lumbar DDD and stenosis. For her balance she reports gait difficulties over the past 3-4 years which have been worse the past 6 months with 2 recent falls over the past few weeks. She does have a history of LE neuropathy with suspected lumbar etiology. See PMH as well.    Pertinent History Thyroid CA s/p thyroidectomy 2018, DM, neuropathy, depression/anxiety, ADD, fibromyalgia, bilateral trochanteric bursitis    Limitations Standing;Walking;House hold activities    Patient Stated Goals Improve back pain and balance    Currently in Pain? No/denies   no pain in sitting at eval,  symptoms primarily with standing and walking             [facility] PT Assessment - 03/20/20 0001      Assessment   Medical Diagnosis Gait Imbalance, falls, back pain, scoliosis    Referring Provider (PT) Lavone Orn, MD    Onset Date/Surgical Date --   chronic-worse the past 6-12 months   Prior Therapy recent PT for neck pain and past PT for low back about 10 years ago      Precautions   Precautions Fall      Restrictions   Weight Bearing Restrictions No      Balance Screen   Has the patient fallen in the past 6 months Yes    How many times? [place] residence    Living Arrangements Spouse/significant other    Type of Home Other(Comment)   townhome   Home Access Level entry    Home Layout One level    [place] - 4 wheels;Shower seat - built in      Prior Function   Level of Independence Independent with basic ADLs;Independent with community mobility without device      Cognition   Overall Cognitive Status Within Functional Limits for tasks assessed      Observation/Other Assessments   Focus on Therapeutic Outcomes (FOTO)  --   not tested due to time constraints     Sensation   Additional Comments "feels more" on left vs. right at L4-5 dermatomes with L2-S2 dermatomal screen      Posture/Postural Control   Posture/Postural Control Postural limitations    Postural Limitations Rounded Shoulders;Forward head;Increased thoracic kyphosis    Posture Comments right convex thoracic scoliosis noted      Deep Tendon Reflexes   DTR Assessment Site Patella;Achilles    Patella DTR 1+    Achilles DTR 1+      AROM   Overall AROM Comments bilat. hip AROM/PROM grossly [facility]    Lumbar Flexion 80    Lumbar Extension 5    Lumbar - Right Side Bend 20    Lumbar - Left Side Bend 28    Lumbar - Right Rotation [facility]    Lumbar - Left Rotation [facility]      Strength   Right Hip Flexion 4/5    Right Hip External Rotation  4/5    Right  Hip Internal Rotation 4/5    Left Hip Flexion 4/5    Left Hip External Rotation 4-/5  Left Hip Internal Rotation 4-/5    Right Knee Flexion 5/5    Right Knee Extension 5/5    Left Knee Flexion 4+/5    Left Knee Extension 4+/5    Right Ankle Dorsiflexion 5/5    Right Ankle Inversion 4+/5    Right Ankle Eversion 4+/5    Left Ankle Dorsiflexion 4/5    Left Ankle Inversion 4/5    Left Ankle Eversion 4-/5      Flexibility   Soft Tissue Assessment /Muscle Length --   mild hamstring tightness bilat.     Palpation   SI assessment  (+) Thigh thrust bilaterally and (+) SI compression    Palpation comment no significant lumbar muscle tenderness noted      Special Tests   Other special tests SLR (-) bilat.      Transfers   Five time sit to stand comments  22 seconds      Ambulation/Gait   Gait Comments Pt. ambulates in clinic independently without AD with tendency foot slap on left, general unsteadiness/tendency to hold onto clinic equipment and walls while ambulating to exam room, significant pes planus bilat. with overpronation, calcaneal valgus, decreased knee extension bilat. through midstance and decreased right steplength      Standardized Balance Assessment   Standardized Balance Assessment Berg Balance Test      Berg Balance Test   Sit to Stand Able to stand  independently using hands    Standing Unsupported Able to stand 2 minutes with supervision    Sitting with Back Unsupported but Feet Supported on Floor or Stool Able to sit safely and securely 2 minutes    Stand to Sit Controls descent by using hands    Transfers Able to transfer safely, definite need of hands    Standing Unsupported with Eyes Closed Able to stand 10 seconds with supervision    Standing Unsupported with Feet Together Able to place feet together independently and stand for 1 minute with supervision    From Standing, Reach Forward with Outstretched Arm Can reach forward >12 cm safely (5")    From Standing  Position, Pick up Object from Floor Able to pick up shoe, needs supervision    From Standing Position, Turn to Look Behind Over each Shoulder Looks behind one side only/other side shows less weight shift    Turn 360 Degrees Able to turn 360 degrees safely but slowly    Standing Unsupported, Alternately Place Feet on Step/Stool Needs assistance to keep from falling or unable to try    Standing Unsupported, One Foot in Front Able to plae foot ahead of the other independently and hold 30 seconds    Standing on One Leg Tries to lift leg/unable to hold 3 seconds but remains standing independently    Total Score 37                      Objective measurements completed on examination: See above findings.       [facility] PT Treatment/Exercise - 03/20/20 0001      Exercises   Exercises --   brief HEP handout review                 PT Education - 03/20/20 1326    Education Details eval findings, symptom etiology, HEP, POC    Person(s) Educated Patient    Methods Explanation;Verbal cues;Handout    Comprehension Verbalized understanding            PT  Short Term Goals - 03/20/20 1334      PT SHORT TERM GOAL #1   Title Independent with initial HEP    Baseline needs HEP    Time 3    Period Weeks    Status New    Target Date 04/09/20      PT SHORT TERM GOAL #2   Title Improve 5 times sit<>stand time at least 5 sec or greater for improved LE strength to decrease fall risk    Time 3    Period Weeks    Status New    Target Date 04/09/20      PT SHORT TERM GOAL #3   Title Pt. to ambulate at least 150 feet mod I with SPC as trial AD to improve safety with ambulation and decrease incidence of falls    Time 3    Period Weeks    Status New    Target Date 04/09/20             PT Long Term Goals - 03/20/20 1335      PT LONG TERM GOAL #1   Title Improve Berg Balance at least 5 points to work towards decreased fall risk    Baseline 37/56    Time 6     Period Weeks    Status New    Target Date 04/30/20      PT LONG TERM GOAL #2   Title Increase bilat. LE strengtth at least grossly 1/2 MMT grade to improve ability for transfers from low seating    Baseline see objective    Time 6    Period Weeks    Status New    Target Date 04/30/20      PT LONG TERM GOAL #3   Title Tolerate standing and ambulation for chores/cooking and shopping periods at least 20-30 min with LBP decreased at least 40% from baseline status    Time 6    Period Weeks    Status New    Target Date 04/30/20      PT LONG TERM GOAL #4   Title Independent with advanced HEP for continued progress after d/c from formal therapy for balance, LE strength and flexion biased prpgram for LBP    Time 6    Period Weeks    Status New    Target Date 04/30/20                  Plan - 03/20/20 1327    Clinical Impression Statement Back: Pt. presents with chronic LBP with underlying spinal scoliosis and degenerative changes with history of DDD and stenosis. Radicular pain in left>right LE with standing and walking and eased with rest consistent with underlying stenosis. She presents with left>right LE weakness and impaired sensation in dermatomal pattern consistent with radiculopathy. She also localizes some of her LBP symptoms around sacral region so given this and (+) SI provocative tests would suspect contributing SI pain. For balance pt. presents with with multifactorial imbalance with decreased sensation/proprioception and LE weakness as contributing factors. Berg Balance score consistent with increased fall risk. Pt. would benefit from to work on improving safety and addressing functional limitations for mobility.    Personal Factors and Comorbidities Comorbidity 3+;Time since onset of injury/illness/exacerbation    Comorbidities see PMH    Examination-Activity Limitations Squat;Lift;Stairs;Locomotion Level;Carry;Stand    Examination-Participation Restrictions Community  Activity;Meal Prep;Laundry;Cleaning;Shop    Stability/Clinical Decision Making Evolving/Moderate complexity    Clinical Decision Making Moderate    Rehab Potential  Fair    PT Frequency 2x / week    PT Duration 6 weeks    PT Treatment/Interventions ADLs/Self Care Home Management;Cryotherapy;Moist Heat;Therapeutic activities;Gait training;Stair training;Functional mobility training;Neuromuscular re-education;Therapeutic exercise;Patient/family education;Balance training;Manual techniques;Taping    PT Next Visit Plan review HEP as needed, NUSTEP warm up, work on flexion bias lumbar ROM, core/LE/lumbar and postural strengthening, balance/proprioceptive challenges and ankle strengthening, potential trial SPC gait training/instruction    PT Home Exercise Plan Access code: [email or web address]    Consulted and Agree with Plan of Care Patient           Patient will benefit from skilled therapeutic intervention in order to improve the following deficits and impairments:  Decreased activity tolerance, Decreased strength, Pain, Decreased balance, Abnormal gait, Difficulty walking, Impaired sensation  Visit Diagnosis: Other abnormalities of gait and mobility  Chronic low back pain with bilateral sciatica, unspecified back pain laterality  Other idiopathic scoliosis, thoracolumbar region  Muscle weakness (generalized)     Problem List Patient Active Problem List   Diagnosis Date Noted  . Moderate nonproliferative diabetic retinopathy of right eye ([place]) 08/29/2019  . Early stage nonexudative age-related macular degeneration of both eyes 08/29/2019  . Right posterior capsular opacification 08/29/2019  . Nuclear sclerotic cataract of left eye 08/29/2019  . Left thyroid nodule 07/06/2016  . Benign neoplasm of skin of upper limb, including shoulder 04/19/2012  . Ventral hernia, recurrent 01/26/2011    Beaulah Dinning, PT, DPT 03/20/20 1:41 PM  [facility] [address] [place], [place], [address] Phone: [phone]   Fax:  [phone]  Name: Annette Gross MRN: [id number] Date of Birth: 11-Sep-1940

## 2020-03-25 ENCOUNTER — Ambulatory Visit: Payer: Medicare Other

## 2020-04-05 ENCOUNTER — Ambulatory Visit: Payer: Medicare Other | Attending: Internal Medicine

## 2020-04-05 ENCOUNTER — Telehealth: Payer: Self-pay

## 2020-04-05 NOTE — Telephone Encounter (Signed)
Pt did not answer, and there was no voicemail to leave a message to remind of appointment on 12/9.   Phill Myron. Yvette Rack, PT, DPT

## 2020-04-11 ENCOUNTER — Telehealth: Payer: Self-pay

## 2020-04-11 ENCOUNTER — Ambulatory Visit: Payer: Medicare Other

## 2020-04-11 NOTE — Telephone Encounter (Signed)
Contacted pt re: no show visit.  Pt stated she needed to take her husband to an appt and she has been having difficulty getting around due to fibromyalgia. Pt stated she did not wish to continue with PT services. Pt is DCed.

## 2020-05-17 DIAGNOSIS — E1169 Type 2 diabetes mellitus with other specified complication: Secondary | ICD-10-CM | POA: Diagnosis not present

## 2020-05-20 DIAGNOSIS — D509 Iron deficiency anemia, unspecified: Secondary | ICD-10-CM | POA: Diagnosis not present

## 2020-05-20 DIAGNOSIS — E1169 Type 2 diabetes mellitus with other specified complication: Secondary | ICD-10-CM | POA: Diagnosis not present

## 2020-05-20 DIAGNOSIS — K219 Gastro-esophageal reflux disease without esophagitis: Secondary | ICD-10-CM | POA: Diagnosis not present

## 2020-05-20 DIAGNOSIS — E782 Mixed hyperlipidemia: Secondary | ICD-10-CM | POA: Diagnosis not present

## 2020-05-20 DIAGNOSIS — E119 Type 2 diabetes mellitus without complications: Secondary | ICD-10-CM | POA: Diagnosis not present

## 2020-05-20 DIAGNOSIS — J4530 Mild persistent asthma, uncomplicated: Secondary | ICD-10-CM | POA: Diagnosis not present

## 2020-05-20 DIAGNOSIS — E890 Postprocedural hypothyroidism: Secondary | ICD-10-CM | POA: Diagnosis not present

## 2020-06-05 DIAGNOSIS — J4530 Mild persistent asthma, uncomplicated: Secondary | ICD-10-CM | POA: Diagnosis not present

## 2020-06-05 DIAGNOSIS — J301 Allergic rhinitis due to pollen: Secondary | ICD-10-CM | POA: Diagnosis not present

## 2020-06-05 DIAGNOSIS — J3089 Other allergic rhinitis: Secondary | ICD-10-CM | POA: Diagnosis not present

## 2020-06-05 DIAGNOSIS — H1045 Other chronic allergic conjunctivitis: Secondary | ICD-10-CM | POA: Diagnosis not present

## 2020-06-18 DIAGNOSIS — E1169 Type 2 diabetes mellitus with other specified complication: Secondary | ICD-10-CM | POA: Diagnosis not present

## 2020-06-18 DIAGNOSIS — K219 Gastro-esophageal reflux disease without esophagitis: Secondary | ICD-10-CM | POA: Diagnosis not present

## 2020-06-18 DIAGNOSIS — J4530 Mild persistent asthma, uncomplicated: Secondary | ICD-10-CM | POA: Diagnosis not present

## 2020-06-18 DIAGNOSIS — G4700 Insomnia, unspecified: Secondary | ICD-10-CM | POA: Diagnosis not present

## 2020-06-18 DIAGNOSIS — E782 Mixed hyperlipidemia: Secondary | ICD-10-CM | POA: Diagnosis not present

## 2020-06-18 DIAGNOSIS — D509 Iron deficiency anemia, unspecified: Secondary | ICD-10-CM | POA: Diagnosis not present

## 2020-06-18 DIAGNOSIS — E890 Postprocedural hypothyroidism: Secondary | ICD-10-CM | POA: Diagnosis not present

## 2020-06-18 DIAGNOSIS — E119 Type 2 diabetes mellitus without complications: Secondary | ICD-10-CM | POA: Diagnosis not present

## 2020-07-18 DIAGNOSIS — R002 Palpitations: Secondary | ICD-10-CM | POA: Diagnosis not present

## 2020-07-18 DIAGNOSIS — R1904 Left lower quadrant abdominal swelling, mass and lump: Secondary | ICD-10-CM | POA: Diagnosis not present

## 2020-07-18 DIAGNOSIS — E1169 Type 2 diabetes mellitus with other specified complication: Secondary | ICD-10-CM | POA: Diagnosis not present

## 2020-07-18 DIAGNOSIS — K219 Gastro-esophageal reflux disease without esophagitis: Secondary | ICD-10-CM | POA: Diagnosis not present

## 2020-07-18 DIAGNOSIS — E890 Postprocedural hypothyroidism: Secondary | ICD-10-CM | POA: Diagnosis not present

## 2020-07-18 DIAGNOSIS — Z1389 Encounter for screening for other disorder: Secondary | ICD-10-CM | POA: Diagnosis not present

## 2020-07-18 DIAGNOSIS — E782 Mixed hyperlipidemia: Secondary | ICD-10-CM | POA: Diagnosis not present

## 2020-07-18 DIAGNOSIS — Z0000 Encounter for general adult medical examination without abnormal findings: Secondary | ICD-10-CM | POA: Diagnosis not present

## 2020-07-18 DIAGNOSIS — Z7984 Long term (current) use of oral hypoglycemic drugs: Secondary | ICD-10-CM | POA: Diagnosis not present

## 2020-07-18 DIAGNOSIS — G629 Polyneuropathy, unspecified: Secondary | ICD-10-CM | POA: Diagnosis not present

## 2020-07-26 DIAGNOSIS — D509 Iron deficiency anemia, unspecified: Secondary | ICD-10-CM | POA: Diagnosis not present

## 2020-07-26 DIAGNOSIS — J4530 Mild persistent asthma, uncomplicated: Secondary | ICD-10-CM | POA: Diagnosis not present

## 2020-07-26 DIAGNOSIS — E782 Mixed hyperlipidemia: Secondary | ICD-10-CM | POA: Diagnosis not present

## 2020-07-26 DIAGNOSIS — K219 Gastro-esophageal reflux disease without esophagitis: Secondary | ICD-10-CM | POA: Diagnosis not present

## 2020-07-26 DIAGNOSIS — E890 Postprocedural hypothyroidism: Secondary | ICD-10-CM | POA: Diagnosis not present

## 2020-07-26 DIAGNOSIS — E1169 Type 2 diabetes mellitus with other specified complication: Secondary | ICD-10-CM | POA: Diagnosis not present

## 2020-07-26 DIAGNOSIS — G4700 Insomnia, unspecified: Secondary | ICD-10-CM | POA: Diagnosis not present

## 2020-07-31 DIAGNOSIS — Z7189 Other specified counseling: Secondary | ICD-10-CM | POA: Diagnosis not present

## 2020-08-07 DIAGNOSIS — I739 Peripheral vascular disease, unspecified: Secondary | ICD-10-CM | POA: Diagnosis not present

## 2020-08-07 DIAGNOSIS — R030 Elevated blood-pressure reading, without diagnosis of hypertension: Secondary | ICD-10-CM | POA: Diagnosis not present

## 2020-08-07 DIAGNOSIS — E782 Mixed hyperlipidemia: Secondary | ICD-10-CM | POA: Diagnosis not present

## 2020-08-19 DIAGNOSIS — G4700 Insomnia, unspecified: Secondary | ICD-10-CM | POA: Diagnosis not present

## 2020-08-19 DIAGNOSIS — E890 Postprocedural hypothyroidism: Secondary | ICD-10-CM | POA: Diagnosis not present

## 2020-08-19 DIAGNOSIS — K219 Gastro-esophageal reflux disease without esophagitis: Secondary | ICD-10-CM | POA: Diagnosis not present

## 2020-08-19 DIAGNOSIS — D509 Iron deficiency anemia, unspecified: Secondary | ICD-10-CM | POA: Diagnosis not present

## 2020-08-19 DIAGNOSIS — E119 Type 2 diabetes mellitus without complications: Secondary | ICD-10-CM | POA: Diagnosis not present

## 2020-08-19 DIAGNOSIS — E782 Mixed hyperlipidemia: Secondary | ICD-10-CM | POA: Diagnosis not present

## 2020-08-19 DIAGNOSIS — J4530 Mild persistent asthma, uncomplicated: Secondary | ICD-10-CM | POA: Diagnosis not present

## 2020-08-26 DIAGNOSIS — M545 Low back pain, unspecified: Secondary | ICD-10-CM | POA: Diagnosis not present

## 2020-08-28 ENCOUNTER — Encounter (INDEPENDENT_AMBULATORY_CARE_PROVIDER_SITE_OTHER): Payer: Medicare Other | Admitting: Ophthalmology

## 2020-09-04 ENCOUNTER — Encounter (INDEPENDENT_AMBULATORY_CARE_PROVIDER_SITE_OTHER): Payer: Medicare Other | Admitting: Ophthalmology

## 2020-09-28 DIAGNOSIS — R42 Dizziness and giddiness: Secondary | ICD-10-CM | POA: Diagnosis not present

## 2020-10-01 DIAGNOSIS — E119 Type 2 diabetes mellitus without complications: Secondary | ICD-10-CM | POA: Diagnosis not present

## 2020-10-01 DIAGNOSIS — E890 Postprocedural hypothyroidism: Secondary | ICD-10-CM | POA: Diagnosis not present

## 2020-10-01 DIAGNOSIS — E782 Mixed hyperlipidemia: Secondary | ICD-10-CM | POA: Diagnosis not present

## 2020-10-01 DIAGNOSIS — E1169 Type 2 diabetes mellitus with other specified complication: Secondary | ICD-10-CM | POA: Diagnosis not present

## 2020-10-01 DIAGNOSIS — K219 Gastro-esophageal reflux disease without esophagitis: Secondary | ICD-10-CM | POA: Diagnosis not present

## 2020-10-01 DIAGNOSIS — G4700 Insomnia, unspecified: Secondary | ICD-10-CM | POA: Diagnosis not present

## 2020-10-01 DIAGNOSIS — J4530 Mild persistent asthma, uncomplicated: Secondary | ICD-10-CM | POA: Diagnosis not present

## 2020-10-10 ENCOUNTER — Other Ambulatory Visit: Payer: Self-pay

## 2020-10-10 ENCOUNTER — Ambulatory Visit (INDEPENDENT_AMBULATORY_CARE_PROVIDER_SITE_OTHER): Payer: Medicare Other | Admitting: Ophthalmology

## 2020-10-10 ENCOUNTER — Encounter (INDEPENDENT_AMBULATORY_CARE_PROVIDER_SITE_OTHER): Payer: Self-pay | Admitting: Ophthalmology

## 2020-10-10 DIAGNOSIS — H2512 Age-related nuclear cataract, left eye: Secondary | ICD-10-CM | POA: Diagnosis not present

## 2020-10-10 DIAGNOSIS — E113392 Type 2 diabetes mellitus with moderate nonproliferative diabetic retinopathy without macular edema, left eye: Secondary | ICD-10-CM

## 2020-10-10 DIAGNOSIS — E113391 Type 2 diabetes mellitus with moderate nonproliferative diabetic retinopathy without macular edema, right eye: Secondary | ICD-10-CM

## 2020-10-10 DIAGNOSIS — H353131 Nonexudative age-related macular degeneration, bilateral, early dry stage: Secondary | ICD-10-CM | POA: Diagnosis not present

## 2020-10-10 NOTE — Assessment & Plan Note (Signed)
Stable over the preceding 1 year

## 2020-10-10 NOTE — Assessment & Plan Note (Signed)
Minor hard drusen does not reach the level of requiring recommendation for vitamin supplementation therapy but optional per patient

## 2020-10-10 NOTE — Progress Notes (Signed)
10/10/2020     CHIEF COMPLAINT Patient presents for Retina Follow Up (1 Year F/U OU//Pt sts OS seems "fuzzy" but sts she needs cataract sx. Pt sts she was having issues with images merging together OD, but 2 mo ago, it stopped. Pt denies any other new symptoms OU./A1c: "7.2 I think" 6 mo ago/LBS: has not checked)   HISTORY OF PRESENT ILLNESS: Annette Gross is a 80 y.o. female who presents to the clinic today for:   HPI     Retina Follow Up           Diagnosis: Diabetic Retinopathy   Laterality: both eyes   Onset: 1 year ago   Severity: mild   Duration: 1 year   Course: gradually worsening   Comments: 1 Year F/U OU  Pt sts OS seems "fuzzy" but sts she needs cataract sx. Pt sts she was having issues with images merging together OD, but 2 mo ago, it stopped. Pt denies any other new symptoms OU. A1c: "7.2 I think" 6 mo ago LBS: has not checked       Last edited by Rockie Neighbours, [place] on 10/10/2020 10:52 AM.      Referring physician: Lavone Orn, MD [address] [place],  [place] [address]  HISTORICAL INFORMATION:   Selected notes from the MEDICAL RECORD NUMBER    Lab Results  Component Value Date   HGBA1C 7.3 (H) 06/29/2016     CURRENT MEDICATIONS: Current Outpatient Medications (Ophthalmic Drugs)  Medication Sig   Propylene Glycol (SYSTANE BALANCE OP) Place 1 drop into both eyes daily as needed (dry eyes). (Patient not taking: Reported on 03/19/2020)   No current facility-administered medications for this visit. (Ophthalmic Drugs)   Current Outpatient Medications (Other)  Medication Sig   acetaminophen (TYLENOL) 500 MG tablet Take 1,000 mg by mouth 2 (two) times daily.   albuterol (PROVENTIL HFA;VENTOLIN HFA) 108 (90 Base) MCG/ACT inhaler Inhale 2 puffs into the lungs every 4 (four) hours as needed for wheezing or shortness of breath.   amoxicillin-clavulanate (AUGMENTIN) 875-125 MG tablet Take 1 tablet by mouth 2 (two) times daily. (Patient not  taking: Reported on 08/13/2017)   calcitRIOL (ROCALTROL) 0.25 MCG capsule Take 1 capsule (0.25 mcg total) by mouth daily. (Patient not taking: Reported on 03/19/2020)   calcium carbonate (OS-CAL - DOSED IN MG OF ELEMENTAL CALCIUM) 1250 (500 Ca) MG tablet Take 4 tablets (2,000 mg of elemental calcium total) by mouth 4 (four) times daily. (Patient not taking: Reported on 08/13/2017)   carvedilol (COREG) 6.25 MG tablet Take 1 tablet (6.25 mg total) by mouth 2 (two) times daily with a meal. (Patient not taking: Reported on 08/13/2017)   Cholecalciferol (VITAMIN D3) 2000 units TABS Take 4,000 Units by mouth daily.   docusate sodium (COLACE) 100 MG capsule Take 100 mg by mouth 2 (two) times daily as needed for constipation.   DULoxetine (CYMBALTA) 30 MG capsule Take 30 mg by mouth daily.   EPINEPHrine 0.3 mg/0.3 mL IJ SOAJ injection Inject 0.3 mg into the muscle as needed.   feeding supplement (BOOST / RESOURCE BREEZE) LIQD Take 1 Container by mouth 2 (two) times daily between meals. (Patient not taking: Reported on 08/13/2017)   fluticasone (FLONASE) 50 MCG/ACT nasal spray Place 2 sprays into the nose daily.    levocetirizine (XYZAL) 5 MG tablet Take 5 mg by mouth every evening.   levothyroxine (SYNTHROID, LEVOTHROID) 25 MCG tablet Take 25 mcg by mouth daily before breakfast.  lisinopril (PRINIVIL,ZESTRIL) 10 MG tablet Take 1 tablet (10 mg total) by mouth daily. (Patient not taking: Reported on 08/13/2017)   metFORMIN (GLUCOPHAGE) 500 MG tablet Take 1 tablet (500 mg total) by mouth daily with breakfast. (Patient not taking: Reported on 08/13/2017)   montelukast (SINGULAIR) 10 MG tablet Take 10 mg by mouth every evening.   Multiple Vitamin (MULTIVITAMIN) tablet Take 1 tablet by mouth daily.   (Patient not taking: Reported on 03/19/2020)   Multiple Vitamins-Minerals (MULTIVITAMIN ADULT) CHEW Chew 2 each by mouth daily.   omeprazole (PRILOSEC) 40 MG capsule Take 40 mg by mouth daily.     No current  facility-administered medications for this visit. (Other)      REVIEW OF SYSTEMS:    ALLERGIES Allergies  Allergen Reactions   Ciprofloxacin Other (See Comments)    Tendonitis   Iohexol Anaphylaxis   Ivp Dye [Iodinated Diagnostic Agents] Anaphylaxis   Kiwi Extract Anaphylaxis   Gabapentin Other (See Comments)    Auditory hallucinations   Sulfa Antibiotics Hives    Covered legs   Aspirin Other (See Comments)    Stomach pain   Lexapro [Escitalopram Oxalate] Other (See Comments)    Muscle pain   Lipitor [Atorvastatin] Other (See Comments)    Muscle pain   Codeine Other (See Comments)    Severe stomach cramping.   Diphenoxylate-Atropine Other (See Comments)    Severe stomach and dizziness    PAST MEDICAL HISTORY Past Medical History:  Diagnosis Date   Anemia    many yrs ago   Anxiety    Asthma    no recent issues   Cancer ([place])    s/p thyroidectomy 7782   Complication of anesthesia    Cystitis, interstitial    Degenerative disc disease, lumbar    Degenerative joint disease    spinal stenosis "Chronic low back pain"   Depression    Diabetes mellitus    diet control.   Diverticulitis 2005   Diverticulosis 2005   Factor V deficiency ([place])    Fibromyalgia    Fracture of right foot 1995   GERD (gastroesophageal reflux disease)    History of hiatal hernia    Hyperlipidemia    Hypertension    off med x 5 yrs.   IBS (irritable bowel syndrome)    Insomnia    Iron deficiency    Liver hemangioma 1999   MRI   Mononucleosis    Morton's neuroma    Left foot   Peripheral neuropathy    Pneumonia    years 89 & 89.  None since   PONV (postoperative nausea and vomiting)    nausea no vomiting   Rectocele    Transfusion history    child x2, many yrs ago after childbirth"hemorrhage"   Umbilical hernia 4235   Past Surgical History:  Procedure Laterality Date   blood vessel tumor removal  1980   from chin   CHOLECYSTECTOMY     COLONOSCOPY WITH PROPOFOL N/A  09/02/2015   Procedure: COLONOSCOPY WITH PROPOFOL;  Surgeon: Garlan Fair, MD;  Location: [facility];  Service: Endoscopy;  Laterality: N/A;   GANGLION CYST EXCISION     right   HEMORRHOID SURGERY     HERNIA REPAIR     repair was aimed at the [place]     and nissen fundoplication   LAPAROSCOPIC ESOPHAGOGASTRIC FUNDOPLASTY     LAPAROSCOPIC INCISIONAL / UMBILICAL / VENTRAL HERNIA REPAIR     umbilical hernia  OOPHORECTOMY     left   RIGHT OOPHORECTOMY     '05-laparaoscopic   SHOULDER SURGERY Right    THYROIDECTOMY N/A 07/06/2016   Procedure: TOTAL THYROIDECTOMY;  Surgeon: Jackolyn Confer, MD;  Location: [facility];  Service: General;  Laterality: N/A;   TONSILLECTOMY     TOTAL THYROIDECTOMY  07/06/2016    FAMILY HISTORY Family History  Problem Relation Age of Onset   Stroke Father    Heart disease Father     SOCIAL HISTORY Social History   Tobacco Use   Smoking status: Never   Smokeless tobacco: Never  Substance Use Topics   Alcohol use: Yes    Alcohol/week: 2.0 standard drinks    Types: 2 Glasses of wine per week    Comment: social   Drug use: No         OPHTHALMIC EXAM:  Base Eye Exam     Visual Acuity (ETDRS)       Right Left   Dist [place] 20/25 +1 20/40 +2   Dist ph   NI         Tonometry (Tonopen, 10:56 AM)       Right Left   Pressure 15 22  Squeezing OS        Pupils       Pupils Dark Light Shape React APD   Right PERRL 5 4 Round Brisk None   Left PERRL 5 4 Round Brisk None         Visual Fields (Counting fingers)       Left Right    Full Full         Extraocular Movement       Right Left    Full Full         Neuro/Psych     Oriented x3: Yes   Mood/Affect: Normal         Dilation     Both eyes: 1.0% Mydriacyl, 2.5% Phenylephrine @ 10:56 AM           Slit Lamp and Fundus Exam     External Exam       Right Left   External Normal Normal         Slit Lamp Exam       Right Left    Lids/Lashes Normal Normal   Conjunctiva/Sclera White and quiet White and quiet   Cornea Clear Clear   Anterior Chamber Deep and quiet Deep and quiet   Iris Round and reactive Round and reactive   Lens Posterior chamber intraocular lens 2+ Nuclear sclerosis   Anterior Vitreous Normal Normal         Fundus Exam       Right Left   Posterior Vitreous Normal Normal   Disc Normal Normal   C/D Ratio 0.2 0.25   Macula Hard drusen, no macular thickening Hard drusen, no macular thickening   Vessels NPDR- Mild NPDR- Mild   Periphery Normal Normal            IMAGING AND PROCEDURES  Imaging and Procedures for 10/10/20  OCT, Retina - OU - Both Eyes       Right Eye Quality was good. Scan locations included subfoveal. Central Foveal Thickness: 311. Progression has been stable. Findings include normal observations, retinal drusen .   Left Eye Quality was good. Scan locations included subfoveal. Central Foveal Thickness: 305. Progression has been stable. Findings include retinal drusen .   Notes No active maculopathy  ASSESSMENT/PLAN:  Early stage nonexudative age-related macular degeneration of both eyes Minor hard drusen does not reach the level of requiring recommendation for vitamin supplementation therapy but optional per patient   Moderate nonproliferative diabetic retinopathy of left eye (HCC) Stable over the preceding 1 year  Moderate nonproliferative diabetic retinopathy of right eye ([place]) Stable over the preceding 1 year  Nuclear sclerotic cataract of left eye The nature of cataract was discussed with the patient as well as the elective nature of surgery. The patient was reassured that surgery at a later date does not put the patient at risk for a worse outcome. It was emphasized that the need for surgery is dictated by the patient's quality of life as influenced by the cataract. Patient was instructed to maintain close follow up with their general eye  care doctor.  Follow-up Dr. Nathanial Rancher as per patient     ICD-10-CM   1. Moderate nonproliferative diabetic retinopathy of right eye without macular edema associated with type 2 diabetes mellitus (HCC)  E11.3391 OCT, Retina - OU - Both Eyes    2. Early stage nonexudative age-related macular degeneration of both eyes  H35.3131     3. Moderate nonproliferative diabetic retinopathy of left eye without macular edema associated with type 2 diabetes mellitus ([place])  Z61.0960     4. Nuclear sclerotic cataract of left eye  H25.12       1.  No progression of ARMD nor progression of NPDR in either eye.  2.  Patient instructed to maintain excellent blood sugar control to slow progression of NPDR.  3.  Ophthalmic Meds Ordered this visit:  No orders of the defined types were placed in this encounter.      Return in about 1 year (around 10/10/2021) for DILATE OU, COLOR FP, OCT.  There are no Patient Instructions on file for this visit.   Explained the diagnoses, plan, and follow up with the patient and they expressed understanding.  Patient expressed understanding of the importance of proper follow up care.   Clent Demark Shareece Bultman M.D. Diseases & Surgery of the Retina and Vitreous Retina [facility] Diabetic [place] 10/10/20     Abbreviations: M myopia (nearsighted); A astigmatism; H hyperopia (farsighted); P presbyopia; Mrx spectacle prescription;  CTL contact lenses; OD right eye; OS left eye; OU both eyes  XT exotropia; ET esotropia; PEK punctate epithelial keratitis; PEE punctate epithelial erosions; DES dry eye syndrome; MGD meibomian gland dysfunction; ATs artificial tears; PFAT's preservative free artificial tears; [place] nuclear sclerotic cataract; PSC posterior subcapsular cataract; ERM epi-retinal membrane; PVD posterior vitreous detachment; RD retinal detachment; DM diabetes mellitus; DR diabetic retinopathy; NPDR non-proliferative diabetic retinopathy; PDR proliferative diabetic retinopathy;  CSME clinically significant macular edema; DME diabetic macular edema; dbh dot blot hemorrhages; CWS cotton wool spot; POAG primary open angle glaucoma; C/D cup-to-disc ratio; HVF humphrey visual field; GVF goldmann visual field; OCT optical coherence tomography; IOP intraocular pressure; BRVO Branch retinal vein occlusion; CRVO central retinal vein occlusion; CRAO central retinal artery occlusion; BRAO branch retinal artery occlusion; RT retinal tear; SB scleral buckle; PPV pars plana vitrectomy; VH Vitreous hemorrhage; PRP panretinal laser photocoagulation; IVK intravitreal kenalog; VMT vitreomacular traction; MH Macular hole;  NVD neovascularization of the disc; NVE neovascularization elsewhere; AREDS age related eye disease study; ARMD age related macular degeneration; POAG primary open angle glaucoma; EBMD epithelial/anterior basement membrane dystrophy; ACIOL anterior chamber intraocular lens; IOL intraocular lens; PCIOL posterior chamber intraocular lens; Phaco/IOL phacoemulsification with intraocular lens placement; PRK photorefractive keratectomy; LASIK  laser assisted in situ keratomileusis; HTN hypertension; DM diabetes mellitus; COPD chronic obstructive pulmonary disease

## 2020-10-10 NOTE — Assessment & Plan Note (Signed)
The nature of cataract was discussed with the patient as well as the elective nature of surgery. The patient was reassured that surgery at a later date does not put the patient at risk for a worse outcome. It was emphasized that the need for surgery is dictated by the patient's quality of life as influenced by the cataract. Patient was instructed to maintain close follow up with their general eye care doctor.  Follow-up Dr. Nathanial Rancher as per patient

## 2020-10-17 DIAGNOSIS — R269 Unspecified abnormalities of gait and mobility: Secondary | ICD-10-CM | POA: Diagnosis not present

## 2020-10-17 DIAGNOSIS — M6281 Muscle weakness (generalized): Secondary | ICD-10-CM | POA: Diagnosis not present

## 2020-10-22 DIAGNOSIS — M6281 Muscle weakness (generalized): Secondary | ICD-10-CM | POA: Diagnosis not present

## 2020-10-22 DIAGNOSIS — R269 Unspecified abnormalities of gait and mobility: Secondary | ICD-10-CM | POA: Diagnosis not present

## 2020-10-23 DIAGNOSIS — J4530 Mild persistent asthma, uncomplicated: Secondary | ICD-10-CM | POA: Diagnosis not present

## 2020-10-23 DIAGNOSIS — E1169 Type 2 diabetes mellitus with other specified complication: Secondary | ICD-10-CM | POA: Diagnosis not present

## 2020-10-23 DIAGNOSIS — E119 Type 2 diabetes mellitus without complications: Secondary | ICD-10-CM | POA: Diagnosis not present

## 2020-10-23 DIAGNOSIS — D509 Iron deficiency anemia, unspecified: Secondary | ICD-10-CM | POA: Diagnosis not present

## 2020-10-23 DIAGNOSIS — K219 Gastro-esophageal reflux disease without esophagitis: Secondary | ICD-10-CM | POA: Diagnosis not present

## 2020-10-23 DIAGNOSIS — E890 Postprocedural hypothyroidism: Secondary | ICD-10-CM | POA: Diagnosis not present

## 2020-10-23 DIAGNOSIS — G4700 Insomnia, unspecified: Secondary | ICD-10-CM | POA: Diagnosis not present

## 2020-10-23 DIAGNOSIS — E782 Mixed hyperlipidemia: Secondary | ICD-10-CM | POA: Diagnosis not present

## 2020-10-25 ENCOUNTER — Other Ambulatory Visit (HOSPITAL_BASED_OUTPATIENT_CLINIC_OR_DEPARTMENT_OTHER): Payer: Self-pay

## 2020-10-25 ENCOUNTER — Other Ambulatory Visit: Payer: Self-pay

## 2020-10-25 ENCOUNTER — Ambulatory Visit: Payer: Medicare Other | Attending: Internal Medicine

## 2020-10-25 DIAGNOSIS — Z23 Encounter for immunization: Secondary | ICD-10-CM

## 2020-10-25 DIAGNOSIS — R269 Unspecified abnormalities of gait and mobility: Secondary | ICD-10-CM | POA: Diagnosis not present

## 2020-10-25 DIAGNOSIS — M6281 Muscle weakness (generalized): Secondary | ICD-10-CM | POA: Diagnosis not present

## 2020-10-25 MED ORDER — PFIZER-BIONT COVID-19 VAC-TRIS 30 MCG/0.3ML IM SUSP
INTRAMUSCULAR | 0 refills | Status: DC
  Filled 2020-10-25: qty 0.3, 1d supply, fill #0

## 2020-10-25 NOTE — Progress Notes (Signed)
   Covid-19 Vaccination Clinic  Name:  Annette Gross    MRN: [id number] DOB: Apr 01, 1941  10/25/2020  [name] was observed post Covid-19 immunization for 15 minutes without incident. She was provided with Vaccine Information Sheet and instruction to access the V-Safe system.   [name] was instructed to call 911 with any severe reactions post vaccine: Difficulty breathing  Swelling of face and throat  A fast heartbeat  A bad rash all over body  Dizziness and weakness   Immunizations Administered     Name Date Dose VIS Date Route   PFIZER Comrnaty(Gray TOP) Covid-19 Vaccine 10/25/2020  1:27 PM 0.3 mL 04/11/2020 Intramuscular   Manufacturer: [place]   Lot: [id number]   [place]: [phone]

## 2020-10-30 DIAGNOSIS — R269 Unspecified abnormalities of gait and mobility: Secondary | ICD-10-CM | POA: Diagnosis not present

## 2020-10-30 DIAGNOSIS — M6281 Muscle weakness (generalized): Secondary | ICD-10-CM | POA: Diagnosis not present

## 2020-11-01 DIAGNOSIS — M6281 Muscle weakness (generalized): Secondary | ICD-10-CM | POA: Diagnosis not present

## 2020-11-01 DIAGNOSIS — R269 Unspecified abnormalities of gait and mobility: Secondary | ICD-10-CM | POA: Diagnosis not present

## 2020-11-06 DIAGNOSIS — M6281 Muscle weakness (generalized): Secondary | ICD-10-CM | POA: Diagnosis not present

## 2020-11-06 DIAGNOSIS — R269 Unspecified abnormalities of gait and mobility: Secondary | ICD-10-CM | POA: Diagnosis not present

## 2020-11-08 DIAGNOSIS — R269 Unspecified abnormalities of gait and mobility: Secondary | ICD-10-CM | POA: Diagnosis not present

## 2020-11-08 DIAGNOSIS — M6281 Muscle weakness (generalized): Secondary | ICD-10-CM | POA: Diagnosis not present

## 2020-11-11 DIAGNOSIS — M6281 Muscle weakness (generalized): Secondary | ICD-10-CM | POA: Diagnosis not present

## 2020-11-11 DIAGNOSIS — R269 Unspecified abnormalities of gait and mobility: Secondary | ICD-10-CM | POA: Diagnosis not present

## 2020-11-21 DIAGNOSIS — G4700 Insomnia, unspecified: Secondary | ICD-10-CM | POA: Diagnosis not present

## 2020-11-21 DIAGNOSIS — J4530 Mild persistent asthma, uncomplicated: Secondary | ICD-10-CM | POA: Diagnosis not present

## 2020-11-21 DIAGNOSIS — E890 Postprocedural hypothyroidism: Secondary | ICD-10-CM | POA: Diagnosis not present

## 2020-11-21 DIAGNOSIS — E119 Type 2 diabetes mellitus without complications: Secondary | ICD-10-CM | POA: Diagnosis not present

## 2020-11-21 DIAGNOSIS — E782 Mixed hyperlipidemia: Secondary | ICD-10-CM | POA: Diagnosis not present

## 2020-11-21 DIAGNOSIS — K219 Gastro-esophageal reflux disease without esophagitis: Secondary | ICD-10-CM | POA: Diagnosis not present

## 2020-11-21 DIAGNOSIS — E1169 Type 2 diabetes mellitus with other specified complication: Secondary | ICD-10-CM | POA: Diagnosis not present

## 2020-11-21 DIAGNOSIS — D509 Iron deficiency anemia, unspecified: Secondary | ICD-10-CM | POA: Diagnosis not present

## 2020-12-18 DIAGNOSIS — H353131 Nonexudative age-related macular degeneration, bilateral, early dry stage: Secondary | ICD-10-CM | POA: Diagnosis not present

## 2020-12-18 DIAGNOSIS — Z961 Presence of intraocular lens: Secondary | ICD-10-CM | POA: Diagnosis not present

## 2020-12-18 DIAGNOSIS — H2512 Age-related nuclear cataract, left eye: Secondary | ICD-10-CM | POA: Diagnosis not present

## 2020-12-18 DIAGNOSIS — H35371 Puckering of macula, right eye: Secondary | ICD-10-CM | POA: Diagnosis not present

## 2020-12-18 DIAGNOSIS — E119 Type 2 diabetes mellitus without complications: Secondary | ICD-10-CM | POA: Diagnosis not present

## 2020-12-18 DIAGNOSIS — H04123 Dry eye syndrome of bilateral lacrimal glands: Secondary | ICD-10-CM | POA: Diagnosis not present

## 2020-12-24 DIAGNOSIS — K219 Gastro-esophageal reflux disease without esophagitis: Secondary | ICD-10-CM | POA: Diagnosis not present

## 2020-12-24 DIAGNOSIS — D509 Iron deficiency anemia, unspecified: Secondary | ICD-10-CM | POA: Diagnosis not present

## 2020-12-24 DIAGNOSIS — E119 Type 2 diabetes mellitus without complications: Secondary | ICD-10-CM | POA: Diagnosis not present

## 2020-12-24 DIAGNOSIS — E782 Mixed hyperlipidemia: Secondary | ICD-10-CM | POA: Diagnosis not present

## 2020-12-24 DIAGNOSIS — E890 Postprocedural hypothyroidism: Secondary | ICD-10-CM | POA: Diagnosis not present

## 2020-12-24 DIAGNOSIS — G4700 Insomnia, unspecified: Secondary | ICD-10-CM | POA: Diagnosis not present

## 2020-12-24 DIAGNOSIS — J4530 Mild persistent asthma, uncomplicated: Secondary | ICD-10-CM | POA: Diagnosis not present

## 2021-01-16 DIAGNOSIS — Z23 Encounter for immunization: Secondary | ICD-10-CM | POA: Diagnosis not present

## 2021-01-16 DIAGNOSIS — R1904 Left lower quadrant abdominal swelling, mass and lump: Secondary | ICD-10-CM | POA: Diagnosis not present

## 2021-01-16 DIAGNOSIS — K580 Irritable bowel syndrome with diarrhea: Secondary | ICD-10-CM | POA: Diagnosis not present

## 2021-01-17 DIAGNOSIS — Z85850 Personal history of malignant neoplasm of thyroid: Secondary | ICD-10-CM | POA: Diagnosis not present

## 2021-01-17 DIAGNOSIS — E890 Postprocedural hypothyroidism: Secondary | ICD-10-CM | POA: Diagnosis not present

## 2021-01-22 DIAGNOSIS — E1169 Type 2 diabetes mellitus with other specified complication: Secondary | ICD-10-CM | POA: Diagnosis not present

## 2021-01-22 DIAGNOSIS — E890 Postprocedural hypothyroidism: Secondary | ICD-10-CM | POA: Diagnosis not present

## 2021-01-22 DIAGNOSIS — J4530 Mild persistent asthma, uncomplicated: Secondary | ICD-10-CM | POA: Diagnosis not present

## 2021-01-22 DIAGNOSIS — D509 Iron deficiency anemia, unspecified: Secondary | ICD-10-CM | POA: Diagnosis not present

## 2021-01-22 DIAGNOSIS — K219 Gastro-esophageal reflux disease without esophagitis: Secondary | ICD-10-CM | POA: Diagnosis not present

## 2021-01-22 DIAGNOSIS — G4700 Insomnia, unspecified: Secondary | ICD-10-CM | POA: Diagnosis not present

## 2021-01-22 DIAGNOSIS — E782 Mixed hyperlipidemia: Secondary | ICD-10-CM | POA: Diagnosis not present

## 2021-01-28 DIAGNOSIS — J1281 Pneumonia due to SARS-associated coronavirus: Secondary | ICD-10-CM | POA: Diagnosis not present

## 2021-01-28 DIAGNOSIS — Z03818 Encounter for observation for suspected exposure to other biological agents ruled out: Secondary | ICD-10-CM | POA: Diagnosis not present

## 2021-02-13 DIAGNOSIS — K219 Gastro-esophageal reflux disease without esophagitis: Secondary | ICD-10-CM | POA: Diagnosis not present

## 2021-02-13 DIAGNOSIS — E119 Type 2 diabetes mellitus without complications: Secondary | ICD-10-CM | POA: Diagnosis not present

## 2021-02-13 DIAGNOSIS — E7800 Pure hypercholesterolemia, unspecified: Secondary | ICD-10-CM | POA: Diagnosis not present

## 2021-02-13 DIAGNOSIS — R1904 Left lower quadrant abdominal swelling, mass and lump: Secondary | ICD-10-CM | POA: Diagnosis not present

## 2021-02-13 DIAGNOSIS — I10 Essential (primary) hypertension: Secondary | ICD-10-CM | POA: Diagnosis not present

## 2021-02-13 DIAGNOSIS — M797 Fibromyalgia: Secondary | ICD-10-CM | POA: Diagnosis not present

## 2021-02-21 DIAGNOSIS — E119 Type 2 diabetes mellitus without complications: Secondary | ICD-10-CM | POA: Diagnosis not present

## 2021-02-21 DIAGNOSIS — G4700 Insomnia, unspecified: Secondary | ICD-10-CM | POA: Diagnosis not present

## 2021-02-21 DIAGNOSIS — I10 Essential (primary) hypertension: Secondary | ICD-10-CM | POA: Diagnosis not present

## 2021-02-21 DIAGNOSIS — E7800 Pure hypercholesterolemia, unspecified: Secondary | ICD-10-CM | POA: Diagnosis not present

## 2021-02-21 DIAGNOSIS — J4530 Mild persistent asthma, uncomplicated: Secondary | ICD-10-CM | POA: Diagnosis not present

## 2021-02-21 DIAGNOSIS — K219 Gastro-esophageal reflux disease without esophagitis: Secondary | ICD-10-CM | POA: Diagnosis not present

## 2021-02-21 DIAGNOSIS — E782 Mixed hyperlipidemia: Secondary | ICD-10-CM | POA: Diagnosis not present

## 2021-02-21 DIAGNOSIS — E890 Postprocedural hypothyroidism: Secondary | ICD-10-CM | POA: Diagnosis not present

## 2021-02-21 DIAGNOSIS — D509 Iron deficiency anemia, unspecified: Secondary | ICD-10-CM | POA: Diagnosis not present

## 2021-02-21 DIAGNOSIS — E1169 Type 2 diabetes mellitus with other specified complication: Secondary | ICD-10-CM | POA: Diagnosis not present

## 2021-03-10 DIAGNOSIS — M25512 Pain in left shoulder: Secondary | ICD-10-CM | POA: Diagnosis not present

## 2021-03-14 DIAGNOSIS — H25812 Combined forms of age-related cataract, left eye: Secondary | ICD-10-CM | POA: Diagnosis not present

## 2021-04-09 DIAGNOSIS — M25512 Pain in left shoulder: Secondary | ICD-10-CM | POA: Diagnosis not present

## 2021-04-09 DIAGNOSIS — M791 Myalgia, unspecified site: Secondary | ICD-10-CM | POA: Diagnosis not present

## 2021-04-09 DIAGNOSIS — R52 Pain, unspecified: Secondary | ICD-10-CM | POA: Diagnosis not present

## 2021-04-09 DIAGNOSIS — R197 Diarrhea, unspecified: Secondary | ICD-10-CM | POA: Diagnosis not present

## 2021-04-09 DIAGNOSIS — Z03818 Encounter for observation for suspected exposure to other biological agents ruled out: Secondary | ICD-10-CM | POA: Diagnosis not present

## 2021-04-18 ENCOUNTER — Other Ambulatory Visit (HOSPITAL_BASED_OUTPATIENT_CLINIC_OR_DEPARTMENT_OTHER): Payer: Self-pay

## 2021-04-18 ENCOUNTER — Other Ambulatory Visit: Payer: Self-pay

## 2021-04-18 ENCOUNTER — Ambulatory Visit: Payer: Medicare Other | Attending: Internal Medicine

## 2021-04-18 DIAGNOSIS — Z23 Encounter for immunization: Secondary | ICD-10-CM

## 2021-04-18 MED ORDER — PFIZER COVID-19 VAC BIVALENT 30 MCG/0.3ML IM SUSP
INTRAMUSCULAR | 0 refills | Status: DC
  Filled 2021-04-18: qty 0.3, 1d supply, fill #0

## 2021-04-18 NOTE — Progress Notes (Signed)
° °  Covid-19 Vaccination Clinic  Name:  NISHA DHAMI    MRN: [id number] DOB: 01/31/1941  04/18/2021  Ms. Norment was observed post Covid-19 immunization for 15 minutes without incident. She was provided with Vaccine Information Sheet and instruction to access the V-Safe system.   Ms. Zea was instructed to call 911 with any severe reactions post vaccine: Difficulty breathing  Swelling of face and throat  A fast heartbeat  A bad rash all over body  Dizziness and weakness   Immunizations Administered     Name Date Dose VIS Date Route   Pfizer Covid-19 Vaccine Bivalent Booster 04/18/2021  1:55 PM 0.3 mL 01/01/2021 Intramuscular   Manufacturer: [place]   Lot: [id number]   [place]: [phone]

## 2021-04-23 ENCOUNTER — Other Ambulatory Visit: Payer: Self-pay

## 2021-04-23 ENCOUNTER — Encounter (HOSPITAL_BASED_OUTPATIENT_CLINIC_OR_DEPARTMENT_OTHER): Payer: Self-pay

## 2021-04-23 ENCOUNTER — Ambulatory Visit (HOSPITAL_BASED_OUTPATIENT_CLINIC_OR_DEPARTMENT_OTHER): Payer: Medicare Other | Admitting: Physical Therapy

## 2021-04-23 NOTE — Therapy (Incomplete)
OUTPATIENT PHYSICAL THERAPY LOWER EXTREMITY EVALUATION   Patient Name: Annette Gross MRN: [id number] DOB:1941-04-20, 80 y.o., female Today's Date: 04/23/2021    Past Medical History:  Diagnosis Date   Anemia    many yrs ago   Anxiety    Asthma    no recent issues   Cancer [facility])    s/p thyroidectomy 4401   Complication of anesthesia    Cystitis, interstitial    Degenerative disc disease, lumbar    Degenerative joint disease    spinal stenosis "Chronic low back pain"   Depression    Diabetes mellitus    diet control.   Diverticulitis 2005   Diverticulosis 2005   Factor V deficiency ([place])    Fibromyalgia    Fracture of right foot 1995   GERD (gastroesophageal reflux disease)    History of hiatal hernia    Hyperlipidemia    Hypertension    off med x 5 yrs.   IBS (irritable bowel syndrome)    Insomnia    Iron deficiency    Liver hemangioma 1999   MRI   Mononucleosis    Morton's neuroma    Left foot   Peripheral neuropathy    Pneumonia    years 89 & 89.  None since   PONV (postoperative nausea and vomiting)    nausea no vomiting   Rectocele    Transfusion history    child x2, many yrs ago after childbirth"hemorrhage"   Umbilical hernia 0272   Past Surgical History:  Procedure Laterality Date   blood vessel tumor removal  1980   from chin   CHOLECYSTECTOMY     COLONOSCOPY WITH PROPOFOL N/A 09/02/2015   Procedure: COLONOSCOPY WITH PROPOFOL;  Surgeon: Garlan Fair, MD;  Location: [facility];  Service: Endoscopy;  Laterality: N/A;   GANGLION CYST EXCISION     right   HEMORRHOID SURGERY     HERNIA REPAIR     repair was aimed at the [place]     and nissen fundoplication   LAPAROSCOPIC ESOPHAGOGASTRIC FUNDOPLASTY     LAPAROSCOPIC INCISIONAL / UMBILICAL / VENTRAL HERNIA REPAIR     umbilical hernia   OOPHORECTOMY     left   RIGHT OOPHORECTOMY     '05-laparaoscopic   SHOULDER SURGERY Right    THYROIDECTOMY N/A 07/06/2016   Procedure:  TOTAL THYROIDECTOMY;  Surgeon: Jackolyn Confer, MD;  Location: [facility];  Service: General;  Laterality: N/A;   TONSILLECTOMY     TOTAL THYROIDECTOMY  07/06/2016   Patient Active Problem List   Diagnosis Date Noted   Moderate nonproliferative diabetic retinopathy of left eye ([place]) 10/10/2020   Moderate nonproliferative diabetic retinopathy of right eye ([place]) 08/29/2019   Early stage nonexudative age-related macular degeneration of both eyes 08/29/2019   Right posterior capsular opacification 08/29/2019   Nuclear sclerotic cataract of left eye 08/29/2019   Left thyroid nodule 07/06/2016   Benign neoplasm of skin of upper limb, including shoulder 04/19/2012   Ventral hernia, recurrent 01/26/2011    PCP: Lavone Orn, MD  REFERRING PROVIDER: Lavone Orn, MD  REFERRING DIAG: ***  THERAPY DIAG:  No diagnosis found.  ONSET DATE: ***  SUBJECTIVE:   SUBJECTIVE STATEMENT: ***  PERTINENT HISTORY: DM, retinopathy, asthma, DDD, DJD  PAIN:  Are you having pain? {yes/no:20286} VAS scale: ***/10 Pain location: *** Pain orientation: {Pain Orientation:25161}  PAIN TYPE: {type:313116} Pain description: {PAIN DESCRIPTION:21022940}  Aggravating factors: *** Relieving factors: ***  PRECAUTIONS: {Therapy precautions:24002}  WEIGHT BEARING RESTRICTIONS {Yes ***/No:24003}  FALLS:  Has patient fallen in last 6 months? {yes/no:20286}, Number of falls: ***  LIVING ENVIRONMENT: Lives with: {[facility] lives with:25569::"lives with their family"} Lives in: {Lives in:25570} Stairs: {yes/no:20286}; {Stairs:24000} Has following equipment at home: {Assistive devices:23999}  OCCUPATION: ***  PLOF: {PLOF:24004}  PATIENT GOALS ***   OBJECTIVE:   DIAGNOSTIC FINDINGS: N/A  PATIENT SURVEYS:  FOTO ***  COGNITION:  Overall cognitive status: {cognition:24006}     SENSATION:  Light touch: {intact/deficits:24005}  Stereognosis: {intact/deficits:24005}  Hot/Cold:  {intact/deficits:24005}  Proprioception: {intact/deficits:24005}  MUSCLE LENGTH: Hamstrings: Right *** deg; Left *** deg Thomas test: Right *** deg; Left *** deg  POSTURE:  ***  PALPATION: ***  LE AROM/PROM:  A/PROM Right 04/23/2021 Left 04/23/2021  Hip flexion    Hip extension    Hip abduction    Hip adduction    Hip internal rotation    Hip external rotation    Knee flexion    Knee extension    Ankle dorsiflexion    Ankle plantarflexion    Ankle inversion    Ankle eversion     (Blank rows = not tested)  LE MMT:  MMT Right 04/23/2021 Left 04/23/2021  Hip flexion    Hip extension    Hip abduction    Hip adduction    Hip internal rotation    Hip external rotation    Knee flexion    Knee extension    Ankle dorsiflexion    Ankle plantarflexion    Ankle inversion    Ankle eversion     (Blank rows = not tested)  LOWER EXTREMITY SPECIAL TESTS:  {LEspecialtests:26242}  FUNCTIONAL TESTS:  5 times sit to stand: *** Timed up and go (TUG): ***  GAIT: Distance walked: *** Assistive device utilized: {Assistive devices:23999} Level of assistance: {Levels of assistance:24026} Comments: ***    TODAY'S TREATMENT: ***   PATIENT EDUCATION:  Education details: MOI, diagnosis, prognosis, anatomy, exercise progression, DOMS expectations, muscle firing,  envelope of function, HEP, POC  Person educated: Patient Education method: Explanation, Demonstration, Tactile cues, Verbal cues, and Handouts Education comprehension: verbalized understanding, returned demonstration, verbal cues required, and tactile cues required   HOME EXERCISE PROGRAM: ***  ASSESSMENT:  CLINICAL IMPRESSION: Patient is a 80 y.o. female who was seen today for physical therapy evaluation and treatment for cc of gait abnormalities. Pt's s/s appear consistent with ***. Objective impairments include Abnormal gait, decreased activity tolerance, decreased balance, decreased coordination,  decreased endurance, decreased mobility, difficulty walking, decreased ROM, decreased strength, hypomobility, increased muscle spasms, impaired flexibility, improper body mechanics, postural dysfunction, and pain. These impairments are limiting patient from cleaning, community activity, driving, laundry, yard work, shopping, and exercise . Personal factors including Age, Behavior pattern, Time since onset of injury/illness/exacerbation, and 1-2 comorbidities: *** are also affecting patient's functional outcome. Patient will benefit from skilled PT to address above impairments and improve overall function.  REHAB POTENTIAL: Fair    CLINICAL DECISION MAKING: Evolving/moderate complexity  EVALUATION COMPLEXITY: Moderate   GOALS:   SHORT TERM GOALS:  STG Name Target Date Goal status  1 Pt will become independent with HEP in order to demonstrate synthesis of PT education.   06/04/2021 INITIAL  2 Pt will be able to demonstrate STS without UE support in order to demonstrate functional improvement in UE/LE function for self-care and house hold duties.  06/04/2021 INITIAL  3 Pt will be able to demonstrate/report ability to walk >15 mins without pain in order to demonstrate functional  improvement and tolerance to exercise and community mobility.  06/04/2021 INITIAL  4 Pt will score at least *** pt increase on FOTO to demonstrate functional improvement in MCII and pt perceived function.    06/04/2021 INITIAL   LONG TERM GOALS:   LTG Name Target Date Goal status  1 Pt  will become independent with final HEP in order to demonstrate synthesis of PT education.   07/16/2021 INITIAL  2 Pt will score >/= *** on FOTO to demonstrate functional improvement in ***.    07/16/2021 INITIAL  3 Pt will be able to perform 5XSTS in under 12s  in order to demonstrate functional improvement above the cut off score for adults.   07/16/2021 INITIAL  4 Pt will be able to demonstrate TUG in under 10 sec in order to  demonstrate functional improvement in LE function, strength, balance, and mobility for safety with community ambulation.   07/16/2021 INITIAL   PLAN: PT FREQUENCY: 1-2x/week  PT DURATION: 12 weeks  PLANNED INTERVENTIONS: Therapeutic exercises, Therapeutic activity, Neuro Muscular re-education, Balance training, Gait training, Patient/Family education, Joint mobilization, Stair training, Orthotic/Fit training, DME instructions, Aquatic Therapy, Dry Needling, Electrical stimulation, Wheelchair mobility training, Spinal mobilization, Cryotherapy, Moist heat, scar mobilization, Splintting, Taping, Vasopneumatic device, Traction, Ultrasound, Ionotophoresis 4mg /ml Dexamethasone, and Manual therapy  PLAN FOR NEXT SESSION: ***  Daleen Bo PT, DPT 04/23/21 10:54 AM

## 2021-05-06 DIAGNOSIS — Z1231 Encounter for screening mammogram for malignant neoplasm of breast: Secondary | ICD-10-CM | POA: Diagnosis not present

## 2021-05-11 ENCOUNTER — Other Ambulatory Visit: Payer: Self-pay

## 2021-05-11 ENCOUNTER — Other Ambulatory Visit (HOSPITAL_COMMUNITY): Payer: Self-pay

## 2021-05-12 DIAGNOSIS — I739 Peripheral vascular disease, unspecified: Secondary | ICD-10-CM | POA: Diagnosis not present

## 2021-05-12 DIAGNOSIS — R1904 Left lower quadrant abdominal swelling, mass and lump: Secondary | ICD-10-CM | POA: Diagnosis not present

## 2021-05-16 ENCOUNTER — Other Ambulatory Visit: Payer: Self-pay | Admitting: General Surgery

## 2021-05-16 DIAGNOSIS — R1904 Left lower quadrant abdominal swelling, mass and lump: Secondary | ICD-10-CM

## 2021-05-21 ENCOUNTER — Other Ambulatory Visit: Payer: Self-pay

## 2021-05-21 ENCOUNTER — Encounter: Payer: Self-pay | Admitting: Rehabilitative and Restorative Service Providers"

## 2021-05-21 ENCOUNTER — Ambulatory Visit: Payer: Medicare Other | Attending: Orthopaedic Surgery | Admitting: Rehabilitative and Restorative Service Providers"

## 2021-05-21 DIAGNOSIS — M25512 Pain in left shoulder: Secondary | ICD-10-CM | POA: Insufficient documentation

## 2021-05-21 DIAGNOSIS — M4125 Other idiopathic scoliosis, thoracolumbar region: Secondary | ICD-10-CM | POA: Diagnosis not present

## 2021-05-21 DIAGNOSIS — M6281 Muscle weakness (generalized): Secondary | ICD-10-CM | POA: Diagnosis not present

## 2021-05-21 DIAGNOSIS — G8929 Other chronic pain: Secondary | ICD-10-CM | POA: Diagnosis not present

## 2021-05-21 NOTE — Therapy (Signed)
[facility] @ [facility] [address] [place], [place], [address] Phone: [phone]   Fax:  [phone]  Physical Therapy Evaluation  Patient Details  Name: Annette Gross MRN: [id number] Date of Birth: 06/19/1940 Referring Provider (PT): Dr Melrose Nakayama   Encounter Date: 05/21/2021   PT End of Session - 05/21/21 1138     Visit Number 1    Date for PT Re-Evaluation 07/11/21    Authorization Type UHC Medicare    Progress Note Due on Visit 10    PT Start Time 1103    PT Stop Time 1142    PT Time Calculation (min) 39 min    Activity Tolerance Patient tolerated treatment well    Behavior During Therapy [facility] for tasks assessed/performed             Past Medical History:  Diagnosis Date   Anemia    many yrs ago   Anxiety    Asthma    no recent issues   Cancer ([place])    s/p thyroidectomy 9323   Complication of anesthesia    Cystitis, interstitial    Degenerative disc disease, lumbar    Degenerative joint disease    spinal stenosis "Chronic low back pain"   Depression    Diabetes mellitus    diet control.   Diverticulitis 2005   Diverticulosis 2005   Factor V deficiency ([place])    Fibromyalgia    Fracture of right foot 1995   GERD (gastroesophageal reflux disease)    History of hiatal hernia    Hyperlipidemia    Hypertension    off med x 5 yrs.   IBS (irritable bowel syndrome)    Insomnia    Iron deficiency    Liver hemangioma 1999   MRI   Mononucleosis    Morton's neuroma    Left foot   Peripheral neuropathy    Pneumonia    years 89 & 89.  None since   PONV (postoperative nausea and vomiting)    nausea no vomiting   Rectocele    Transfusion history    child x2, many yrs ago after childbirth"hemorrhage"   Umbilical hernia 5573    Past Surgical History:  Procedure Laterality Date   blood vessel tumor removal  1980   from chin   CHOLECYSTECTOMY     COLONOSCOPY WITH PROPOFOL N/A 09/02/2015   Procedure:  COLONOSCOPY WITH PROPOFOL;  Surgeon: Garlan Fair, MD;  Location: [facility];  Service: Endoscopy;  Laterality: N/A;   GANGLION CYST EXCISION     right   HEMORRHOID SURGERY     HERNIA REPAIR     repair was aimed at the [place]     and nissen fundoplication   LAPAROSCOPIC ESOPHAGOGASTRIC FUNDOPLASTY     LAPAROSCOPIC INCISIONAL / UMBILICAL / VENTRAL HERNIA REPAIR     umbilical hernia   OOPHORECTOMY     left   RIGHT OOPHORECTOMY     '05-laparaoscopic   SHOULDER SURGERY Right    THYROIDECTOMY N/A 07/06/2016   Procedure: TOTAL THYROIDECTOMY;  Surgeon: Jackolyn Confer, MD;  Location: [facility];  Service: General;  Laterality: N/A;   TONSILLECTOMY     TOTAL THYROIDECTOMY  07/06/2016    There were no vitals filed for this visit.    Subjective Assessment - 05/21/21 1110     Subjective Pt reports that "year ago" she sustained a partial rotator cuff tear and she did not go to PT following  and it was some better, but then approx 3 months ago, she lifted some heavy kitty litter, and she felt a tear inside her shoulder.  She reports that imaging suggested that she had a bruise. Pt has had increased pain and decreased ROM since that time.    Pertinent History DM, HTN, L rotator cuff tear, R rotator cuff repair in approx 2002    Limitations Lifting;House hold activities    Patient Stated Goals To strengthen my arms.    Currently in Pain? Yes    Pain Score 5     Pain Location Shoulder    Pain Orientation Left    Pain Descriptors / Indicators Sharp    Pain Type Chronic pain    Pain Onset More than a month ago    Pain Frequency Intermittent    Aggravating Factors  movement                [facility] PT Assessment - 05/21/21 0001       Assessment   Medical Diagnosis M75.102 (ICD-10-CM) - Left rotator cuff tear    Referring Provider (PT) Dr Melrose Nakayama    Hand Dominance Right    Next MD Visit needs to call to find out      Precautions   Precautions Fall       Restrictions   Weight Bearing Restrictions No      Balance Screen   Has the patient fallen in the past 6 months No    Has the patient had a decrease in activity level because of a fear of falling?  Yes    Is the patient reluctant to leave their home because of a fear of falling?  No      Home Environment   Living Environment Private residence    Living Arrangements Alone    Available Help at Discharge Family    Type of [place] Level entry    [place] - 4 wheels;Grab bars - tub/shower      Prior Function   Level of [place] Retired    Leisure spending time with Wellsite geologist   Overall Cognitive Status Within Functional Limits for tasks assessed      Observation/Other Assessments   Focus on Therapeutic Outcomes (FOTO)  52%   Projected 59% by visit 11.     Posture/Postural Control   Posture/Postural Control Postural limitations    Postural Limitations Rounded Shoulders;Forward head      ROM / Strength   AROM / PROM / Strength AROM;Strength      AROM   AROM Assessment Site Shoulder    Right/Left Shoulder Right;Left    Right Shoulder Flexion 122 Degrees    Right Shoulder ABduction 124 Degrees    Left Shoulder Flexion 114 Degrees    Left Shoulder ABduction 87 Degrees      Strength   Strength Assessment Site Shoulder    Right/Left Shoulder Right;Left    Right Shoulder Flexion 4+/5    Right Shoulder ABduction 4-/5    Right Shoulder Internal Rotation 4/5    Right Shoulder External Rotation 4/5    Left Shoulder Flexion 4-/5    Left Shoulder ABduction 3+/5    Left Shoulder Internal Rotation 4-/5    Left Shoulder External Rotation 4-/5  Objective measurements completed on examination: See above findings.       [facility] Adult PT Treatment/Exercise - 05/21/21 0001       Exercises   Exercises Shoulder      Shoulder Exercises: Seated    Retraction Strengthening;Both;10 reps      Shoulder Exercises: ROM/Strengthening   Pendulum flexion/extension x10 reps    Other ROM/Strengthening Exercises flexion and abduction table slide AA/ROM x10 each                     PT Education - 05/21/21 1137     Education Details Pt provided with HEP    Person(s) Educated Patient    Methods Explanation;Demonstration;Handout    Comprehension Verbalized understanding;Returned demonstration              PT Short Term Goals - 05/21/21 1156       PT SHORT TERM GOAL #1   Title Independent with initial HEP    Time 3    Period Weeks    Status New               PT Long Term Goals - 05/21/21 1156       PT LONG TERM GOAL #1   Title Pt will be independent with advanced HEP.    Time 8    Period Weeks    Status New      PT LONG TERM GOAL #2   Title Pt will increase B shoulder strength to at least 4+/5 to allow her to pick up heavy objects in home.    Time 8    Period Weeks    Status New      PT LONG TERM GOAL #3   Title Pt will increase L shoulder flexion and abduction to at least 120 degrees to allow her to reach into overhead cabinets.    Time 8    Period Weeks    Status New      PT LONG TERM GOAL #4   Title Pt will increase FOTO to 59% to allow her to perform more functional activities in her home.    Time 8    Period Weeks    Status New                    Plan - 05/21/21 1142     Clinical Impression Statement Pt is a 81 y.o. female referred to outpatient PT from Dr Rhona Raider with a diagnosis of L rotator cuff tear. Pts PLOF is living alone and able to care for her cats.  Pt has a son who lives nearby and can assist as needed. Pt reports that she is having the most difficulty with her L shoulder, but she also feels that she is having some R shoulder weakness as well.  Pt presents with L shoulder pain, decreased L shoulder ROM, decreased bilateral shoulder strength, and difficulty with  functional activities in the home.  Pt is having some difficulty lifting heavier items in her home, such as the cat litter. Pt would benefit from skilled PT to address her functional impairments to allow her to be safer and more independent in her home.    Personal Factors and Comorbidities Age;Comorbidity 3+    Comorbidities DM, R rotator cuff repair in approx 2002, idopathic scoliosis    Examination-Activity Limitations Bathing;Reach Overhead;Lift    Examination-Participation Restrictions Community Activity;Driving    Stability/Clinical Decision Making Evolving/Moderate complexity    Clinical Decision Making Moderate  Rehab Potential Good    PT Frequency 2x / week    PT Duration 8 weeks    PT Treatment/Interventions ADLs/Self Care Home Management;Aquatic Therapy;Cryotherapy;Electrical Stimulation;Iontophoresis 4mg /ml Dexamethasone;Moist Heat;Ultrasound;Functional mobility training;Therapeutic activities;Therapeutic exercise;Neuromuscular re-education;Patient/family education;Manual techniques;Passive range of motion;Dry needling;Taping;Vasopneumatic Device;Joint Manipulations    PT Next Visit Plan assess and progress HEP as indicated, strengthening, ROM    PT Home Exercise Plan Access Code: [email or web address]    Consulted and Agree with Plan of Care Patient             Patient will benefit from skilled therapeutic intervention in order to improve the following deficits and impairments:  Decreased range of motion, Impaired UE functional use, Pain, Postural dysfunction, Decreased strength  Visit Diagnosis: Muscle weakness (generalized) - Plan: PT plan of care cert/re-cert  Chronic left shoulder pain - Plan: PT plan of care cert/re-cert  Other idiopathic scoliosis, thoracolumbar region - Plan: PT plan of care cert/re-cert     Problem List Patient Active Problem List   Diagnosis Date Noted   Moderate nonproliferative diabetic retinopathy of left eye ([facility]) 10/10/2020   Moderate  nonproliferative diabetic retinopathy of right eye ([place]) 08/29/2019   Early stage nonexudative age-related macular degeneration of both eyes 08/29/2019   Right posterior capsular opacification 08/29/2019   Nuclear sclerotic cataract of left eye 08/29/2019   Left thyroid nodule 07/06/2016   Benign neoplasm of skin of upper limb, including shoulder 04/19/2012   Ventral hernia, recurrent 01/26/2011    Juel Burrow, PT, DPT 05/21/2021, 12:02 PM  [facility] @ [facility], [place], [address] Phone: [phone]   Fax:  [phone]  Name: Annette Gross MRN: [id number] Date of Birth: [birth year]

## 2021-05-21 NOTE — Patient Instructions (Signed)
Access Code: [email or web address] URL: [email or web address] Date: 05/21/2021 Prepared by: Shelby Dubin Brayla Pat  Exercises Seated Scapular Retraction - 1-2 x daily - 7 x weekly - 2 sets - 10 reps Seated Shoulder Flexion Towel Slide at Table Top - 1-2 x daily - 7 x weekly - 2 sets - 10 reps Seated Shoulder Abduction Towel Slide at Table Top - 1-2 x daily - 7 x weekly - 2 sets - 10 reps Flexion-Extension Shoulder Pendulum with Table Support - 1 x daily - 7 x weekly - 2 sets - 10 reps

## 2021-05-29 ENCOUNTER — Other Ambulatory Visit: Payer: Self-pay

## 2021-05-29 ENCOUNTER — Ambulatory Visit: Payer: Medicare Other | Admitting: Physical Therapy

## 2021-05-29 ENCOUNTER — Ambulatory Visit
Admission: RE | Admit: 2021-05-29 | Discharge: 2021-05-29 | Disposition: A | Discharge status: Home / Self Care | Payer: Medicare Other | Source: Ambulatory Visit | Attending: General Surgery | Admitting: General Surgery

## 2021-05-29 DIAGNOSIS — M4125 Other idiopathic scoliosis, thoracolumbar region: Secondary | ICD-10-CM | POA: Diagnosis not present

## 2021-05-29 DIAGNOSIS — G8929 Other chronic pain: Secondary | ICD-10-CM | POA: Diagnosis not present

## 2021-05-29 DIAGNOSIS — M25512 Pain in left shoulder: Secondary | ICD-10-CM | POA: Diagnosis not present

## 2021-05-29 DIAGNOSIS — M6281 Muscle weakness (generalized): Secondary | ICD-10-CM | POA: Diagnosis not present

## 2021-05-29 DIAGNOSIS — R1904 Left lower quadrant abdominal swelling, mass and lump: Secondary | ICD-10-CM

## 2021-05-29 NOTE — Patient Instructions (Signed)
Access Code: [email or web address] URL: [email or web address] Date: 05/29/2021 Prepared by: Ruben Im  Exercises Seated Scapular Retraction - 1-2 x daily - 7 x weekly - 2 sets - 10 reps Seated Shoulder Flexion Towel Slide at Table Top - 1-2 x daily - 7 x weekly - 2 sets - 10 reps Seated Shoulder Abduction Towel Slide at Table Top - 1-2 x daily - 7 x weekly - 2 sets - 10 reps Flexion-Extension Shoulder Pendulum with Table Support - 1 x daily - 7 x weekly - 2 sets - 10 reps Standing Single Arm Row with Resistance Thumb Up (Mirrored) - 1 x daily - 7 x weekly - 1 sets - 10 reps Single Arm Shoulder Extension with Anchored Resistance (Mirrored) - 1 x daily - 7 x weekly - 1 sets - 10 reps Shoulder External Rotation with Anchored Resistance - 1 x daily - 7 x weekly - 1 sets - 10 reps Standing Shoulder Internal Rotation with Anchored Resistance - 1 x daily - 7 x weekly - 1 sets - 10 reps Seated Single Arm Elbow Flexion with Resistance - 1 x daily - 7 x weekly - 1 sets - 10 reps Seated Thoracic Lumbar Extension with Pectoralis Stretch - 1 x daily - 7 x weekly - 1 sets - 10 reps

## 2021-05-29 NOTE — Therapy (Signed)
[facility] @ [facility] [place], [place], [address] Phone: [phone]   Fax:  [phone]  Physical Therapy Treatment  Patient Details  Name: Annette Gross MRN: [id number] Date of Birth: 25-Feb-1941 Referring Provider (PT): Dr Melrose Nakayama   Encounter Date: 05/29/2021   PT End of Session - 05/29/21 1110     Visit Number 2    Date for PT Re-Evaluation 07/11/21    Authorization Type UHC Medicare    Progress Note Due on Visit 10    PT Start Time 1020    PT Stop Time 1058    PT Time Calculation (min) 38 min    Activity Tolerance Patient tolerated treatment well             Past Medical History:  Diagnosis Date   Anemia    many yrs ago   Anxiety    Asthma    no recent issues   Cancer ([place])    s/p thyroidectomy 6222   Complication of anesthesia    Cystitis, interstitial    Degenerative disc disease, lumbar    Degenerative joint disease    spinal stenosis "Chronic low back pain"   Depression    Diabetes mellitus    diet control.   Diverticulitis 2005   Diverticulosis 2005   Factor V deficiency ([place])    Fibromyalgia    Fracture of right foot 1995   GERD (gastroesophageal reflux disease)    History of hiatal hernia    Hyperlipidemia    Hypertension    off med x 5 yrs.   IBS (irritable bowel syndrome)    Insomnia    Iron deficiency    Liver hemangioma 1999   MRI   Mononucleosis    Morton's neuroma    Left foot   Peripheral neuropathy    Pneumonia    years 89 & 89.  None since   PONV (postoperative nausea and vomiting)    nausea no vomiting   Rectocele    Transfusion history    child x2, many yrs ago after childbirth"hemorrhage"   Umbilical hernia 9798    Past Surgical History:  Procedure Laterality Date   blood vessel tumor removal  1980   from chin   CHOLECYSTECTOMY     COLONOSCOPY WITH PROPOFOL N/A 09/02/2015   Procedure: COLONOSCOPY WITH PROPOFOL;  Surgeon: Garlan Fair, MD;   Location: [facility];  Service: Endoscopy;  Laterality: N/A;   GANGLION CYST EXCISION     right   HEMORRHOID SURGERY     HERNIA REPAIR     repair was aimed at the [place]     and nissen fundoplication   LAPAROSCOPIC ESOPHAGOGASTRIC FUNDOPLASTY     LAPAROSCOPIC INCISIONAL / UMBILICAL / VENTRAL HERNIA REPAIR     umbilical hernia   OOPHORECTOMY     left   RIGHT OOPHORECTOMY     '05-laparaoscopic   SHOULDER SURGERY Right    THYROIDECTOMY N/A 07/06/2016   Procedure: TOTAL THYROIDECTOMY;  Surgeon: Jackolyn Confer, MD;  Location: [facility];  Service: General;  Laterality: N/A;   TONSILLECTOMY     TOTAL THYROIDECTOMY  07/06/2016    There were no vitals filed for this visit.   Subjective Assessment - 05/29/21 1023     Subjective My shoulder is feeling better.  I was even able to lift 1/2 gallon of milk into fridge.    Pertinent History DM, HTN, L rotator cuff tear, R rotator  cuff repair in approx 2002    Currently in Pain? No/denies    Pain Score 0-No pain    Pain Orientation Left                               [facility] Adult PT Treatment/Exercise - 05/29/21 0001       Shoulder Exercises: Seated   Other Seated Exercises yellow band biceps curls 20x    Other Seated Exercises thoracic extension with ball mid back 10x      Shoulder Exercises: Standing   External Rotation Strengthening;Left;10 reps;Theraband    Theraband Level (Shoulder External Rotation) Level 1 (Yellow)    External Rotation Limitations small range    Internal Rotation Strengthening;Left;10 reps;Theraband    Theraband Level (Shoulder Internal Rotation) Level 1 (Yellow)    Extension Strengthening;Left;10 reps;Theraband    Theraband Level (Shoulder Extension) Level 1 (Yellow)    Row Strengthening;Left;10 reps    Theraband Level (Shoulder Row) Level 1 (Yellow)      Shoulder Exercises: ROM/Strengthening   Other ROM/Strengthening Exercises seated UE Ranger on floor 30x forward and  back    Other ROM/Strengthening Exercises Pulleys hooked to Matrix with 4# counter weight on right: pull downs with left 3 sets of 10x from 3 angles                     PT Education - 05/29/21 1109     Education Details yellow band ex's, seated thoracic extension    Person(s) Educated Patient    Methods Explanation;Demonstration;Handout    Comprehension Returned demonstration;Verbalized understanding              PT Short Term Goals - 05/21/21 1156       PT SHORT TERM GOAL #1   Title Independent with initial HEP    Time 3    Period Weeks    Status New               PT Long Term Goals - 05/21/21 1156       PT LONG TERM GOAL #1   Title Pt will be independent with advanced HEP.    Time 8    Period Weeks    Status New      PT LONG TERM GOAL #2   Title Pt will increase B shoulder strength to at least 4+/5 to allow her to pick up heavy objects in home.    Time 8    Period Weeks    Status New      PT LONG TERM GOAL #3   Title Pt will increase L shoulder flexion and abduction to at least 120 degrees to allow her to reach into overhead cabinets.    Time 8    Period Weeks    Status New      PT LONG TERM GOAL #4   Title Pt will increase FOTO to 59% to allow her to perform more functional activities in her home.    Time 8    Period Weeks    Status New                   Plan - 05/29/21 1045     Clinical Impression Statement The patient reports no pain at rest.  She is able to perform a progression of ex's and reports some initital clicking in her shoulder  but then it resolves with repetition.  No complaints of pain.  Verbal cues to avoid compensatory shoulder hike.    Comorbidities DM, R rotator cuff repair in approx 2002, idopathic scoliosis    Examination-Participation Restrictions Community Activity;Driving    Rehab Potential Good    PT Frequency 2x / week    PT Duration 8 weeks    PT Treatment/Interventions ADLs/Self Care Home  Management;Aquatic Therapy;Cryotherapy;Electrical Stimulation;Iontophoresis 4mg /ml Dexamethasone;Moist Heat;Ultrasound;Functional mobility training;Therapeutic activities;Therapeutic exercise;Neuromuscular re-education;Patient/family education;Manual techniques;Passive range of motion;Dry needling;Taping;Vasopneumatic Device;Joint Manipulations    PT Next Visit Plan assess and progress HEP as indicated, strengthening, ROM    PT Home Exercise Plan Access Code: [email or web address]             Patient will benefit from skilled therapeutic intervention in order to improve the following deficits and impairments:  Decreased range of motion, Impaired UE functional use, Pain, Postural dysfunction, Decreased strength  Visit Diagnosis: Muscle weakness (generalized)  Chronic left shoulder pain     Problem List Patient Active Problem List   Diagnosis Date Noted   Moderate nonproliferative diabetic retinopathy of left eye ([place]) 10/10/2020   Moderate nonproliferative diabetic retinopathy of right eye ([place]) 08/29/2019   Early stage nonexudative age-related macular degeneration of both eyes 08/29/2019   Right posterior capsular opacification 08/29/2019   Nuclear sclerotic cataract of left eye 08/29/2019   Left thyroid nodule 07/06/2016   Benign neoplasm of skin of upper limb, including shoulder 04/19/2012   Ventral hernia, recurrent 01/26/2011   Ruben Im, PT 05/29/21 1:09 PM Phone: [phone] Fax: [phone]  Alvera Singh, PT 05/29/2021, 1:08 PM  [facility] @ [facility] [place], [place], [address] Phone: [phone]   Fax:  [phone]  Name: KARYNA BESSLER MRN: [id number] Date of Birth: 1940/07/31

## 2021-06-03 ENCOUNTER — Encounter: Payer: Medicare Other | Admitting: Rehabilitative and Restorative Service Providers"

## 2021-06-05 ENCOUNTER — Ambulatory Visit: Payer: Medicare Other | Attending: Orthopaedic Surgery | Admitting: Rehabilitative and Restorative Service Providers"

## 2021-06-05 ENCOUNTER — Other Ambulatory Visit: Payer: Self-pay

## 2021-06-05 ENCOUNTER — Encounter: Payer: Self-pay | Admitting: Rehabilitative and Restorative Service Providers"

## 2021-06-05 DIAGNOSIS — M4125 Other idiopathic scoliosis, thoracolumbar region: Secondary | ICD-10-CM | POA: Diagnosis not present

## 2021-06-05 DIAGNOSIS — M25512 Pain in left shoulder: Secondary | ICD-10-CM | POA: Diagnosis not present

## 2021-06-05 DIAGNOSIS — G8929 Other chronic pain: Secondary | ICD-10-CM | POA: Diagnosis not present

## 2021-06-05 DIAGNOSIS — M6281 Muscle weakness (generalized): Secondary | ICD-10-CM | POA: Diagnosis not present

## 2021-06-05 NOTE — Therapy (Signed)
[facility] @ [place], [place], [address] Phone: [phone]   Fax:  [phone]  Physical Therapy Treatment  Patient Details  Name: Annette Gross Gross MRN: [id number] Date of Birth: 1940/06/12 Referring Provider (PT): Dr Melrose Nakayama   Encounter Date: 06/05/2021   PT End of Session - 06/05/21 0940     Visit Number 3    Date for PT Re-Evaluation 07/11/21    Authorization Type UHC Medicare    Progress Note Due on Visit 10    PT Start Time 0933    PT Stop Time 1011    PT Time Calculation (min) 38 min    Activity Tolerance Patient tolerated treatment well    Behavior During Therapy [facility] for tasks assessed/performed             Past Medical History:  Diagnosis Date   Anemia    many yrs ago   Anxiety    Asthma    no recent issues   Cancer ([place])    s/p thyroidectomy 5852   Complication of anesthesia    Cystitis, interstitial    Degenerative disc disease, lumbar    Degenerative joint disease    spinal stenosis "Chronic low back pain"   Depression    Diabetes mellitus    diet control.   Diverticulitis 2005   Diverticulosis 2005   Factor V deficiency ([place])    Fibromyalgia    Fracture of right foot 1995   GERD (gastroesophageal reflux disease)    History of hiatal hernia    Hyperlipidemia    Hypertension    off med x 5 yrs.   IBS (irritable bowel syndrome)    Insomnia    Iron deficiency    Liver hemangioma 1999   MRI   Mononucleosis    Morton's neuroma    Left foot   Peripheral neuropathy    Pneumonia    years 89 & 89.  None since   PONV (postoperative nausea and vomiting)    nausea no vomiting   Rectocele    Transfusion history    child x2, many yrs ago after childbirth"hemorrhage"   Umbilical hernia 7782    Past Surgical History:  Procedure Laterality Date   blood vessel tumor removal  1980   from chin   CHOLECYSTECTOMY     COLONOSCOPY WITH PROPOFOL N/A 09/02/2015   Procedure:  COLONOSCOPY WITH PROPOFOL;  Surgeon: Garlan Fair, MD;  Location: [facility];  Service: Endoscopy;  Laterality: N/A;   GANGLION CYST EXCISION     right   HEMORRHOID SURGERY     HERNIA REPAIR     repair was aimed at the [place]     and nissen fundoplication   LAPAROSCOPIC ESOPHAGOGASTRIC FUNDOPLASTY     LAPAROSCOPIC INCISIONAL / UMBILICAL / VENTRAL HERNIA REPAIR     umbilical hernia   OOPHORECTOMY     left   RIGHT OOPHORECTOMY     '05-laparaoscopic   SHOULDER SURGERY Right    THYROIDECTOMY N/A 07/06/2016   Procedure: TOTAL THYROIDECTOMY;  Surgeon: Jackolyn Confer, MD;  Location: [facility];  Service: General;  Laterality: N/A;   TONSILLECTOMY     TOTAL THYROIDECTOMY  07/06/2016    There were no vitals filed for this visit.   Subjective Assessment - 06/05/21 0941     Subjective Pt reports putting on her shirt and got tangled up and had some increased pain. Pt reports that she has been  able to lift more things in her home, but not yet the 25# kitty litter.    Pertinent History DM, HTN, L rotator cuff tear, R rotator cuff repair in approx 2002    Patient Stated Goals To strengthen my arms.    Currently in Pain? No/denies                               [facility] Adult PT Treatment/Exercise - 06/05/21 0001       Shoulder Exercises: Seated   Flexion Strengthening;Both;20 reps    Flexion Weight (lbs) 2#    Abduction Strengthening;Both;15 reps    Other Seated Exercises Table slide with towel x20 reps flexion and abduction each.  Bicep curls 2# 2x10    Other Seated Exercises thoracic extension with ball mid back 10x      Shoulder Exercises: Standing   External Rotation Strengthening;Both;20 reps    Theraband Level (Shoulder External Rotation) Level 1 (Yellow)    Internal Rotation Strengthening;Left;20 reps    Theraband Level (Shoulder Internal Rotation) Level 1 (Yellow)    Extension Strengthening;Both;20 reps    Theraband Level (Shoulder  Extension) Level 1 (Yellow)    Row Strengthening;Both;20 reps    Theraband Level (Shoulder Row) Level 1 (Yellow)      Shoulder Exercises: ROM/Strengthening   Nustep L2 x5 min with PT present to discuss status.    Wall Wash flexion and scaption x20 each      Manual Therapy   Manual Therapy Passive ROM    Passive ROM to L shoulder in all planes of motion                       PT Short Term Goals - 06/05/21 1016       PT SHORT TERM GOAL #1   Title Independent with initial HEP    Status Achieved               PT Long Term Goals - 06/05/21 1016       PT LONG TERM GOAL #1   Title Pt will be independent with advanced HEP.    Status On-going      PT LONG TERM GOAL #2   Title Pt will increase B shoulder strength to at least 4+/5 to allow her to pick up heavy objects in home.    Status On-going      PT LONG TERM GOAL #3   Title Pt will increase L shoulder flexion and abduction to at least 120 degrees to allow her to reach into overhead cabinets.    Status On-going      PT LONG TERM GOAL #4   Title Pt will increase FOTO to 59% to allow her to perform more functional activities in her home.    Status On-going                   Plan - 06/05/21 1013     Clinical Impression Statement Annette Gross Gross continues to progress towards goal related activities.  She reports having to miss earlier this week secondary to her son needing to borrow her car, but states compliance with HEP. Pt with some difficulty with flexion and shoulder with 2# and requires min A with abduction. Pt continues to require cuing for avoiding shoulder hiking compensation during ther ex. Pt continues to require skilled PT to progress towards goal related activities.    Personal Factors and Comorbidities  Age;Comorbidity 3+    Comorbidities DM, R rotator cuff repair in approx 2002, idopathic scoliosis    PT Treatment/Interventions ADLs/Self Care Home Management;Aquatic  Therapy;Cryotherapy;Electrical Stimulation;Iontophoresis 4mg /ml Dexamethasone;Moist Heat;Ultrasound;Functional mobility training;Therapeutic activities;Therapeutic exercise;Neuromuscular re-education;Patient/family education;Manual techniques;Passive range of motion;Dry needling;Taping;Vasopneumatic Device;Joint Manipulations    PT Next Visit Plan assess and progress HEP as indicated, strengthening, ROM    PT Home Exercise Plan Access Code: [email or web address]    Consulted and Agree with Plan of Care Patient             Patient will benefit from skilled therapeutic intervention in order to improve the following deficits and impairments:  Decreased range of motion, Impaired UE functional use, Pain, Postural dysfunction, Decreased strength  Visit Diagnosis: Muscle weakness (generalized)  Chronic left shoulder pain  Other idiopathic scoliosis, thoracolumbar region     Problem List Patient Active Problem List   Diagnosis Date Noted   Moderate nonproliferative diabetic retinopathy of left eye (HCC) 10/10/2020   Moderate nonproliferative diabetic retinopathy of right eye ([place]) 08/29/2019   Early stage nonexudative age-related macular degeneration of both eyes 08/29/2019   Right posterior capsular opacification 08/29/2019   Nuclear sclerotic cataract of left eye 08/29/2019   Left thyroid nodule 07/06/2016   Benign neoplasm of skin of upper limb, including shoulder 04/19/2012   Ventral hernia, recurrent 01/26/2011    Juel Burrow, PT, DPT 06/05/2021, 10:17 AM  [facility] @ [place], [place], [address] Phone: [phone]   Fax:  [phone]  Name: Annette Gross Gross MRN: [id number] Date of Birth: 1940-09-20

## 2021-06-16 DIAGNOSIS — J301 Allergic rhinitis due to pollen: Secondary | ICD-10-CM | POA: Diagnosis not present

## 2021-06-16 DIAGNOSIS — J4530 Mild persistent asthma, uncomplicated: Secondary | ICD-10-CM | POA: Diagnosis not present

## 2021-06-16 DIAGNOSIS — H1045 Other chronic allergic conjunctivitis: Secondary | ICD-10-CM | POA: Diagnosis not present

## 2021-06-16 DIAGNOSIS — J3089 Other allergic rhinitis: Secondary | ICD-10-CM | POA: Diagnosis not present

## 2021-06-17 ENCOUNTER — Encounter: Payer: Medicare Other | Admitting: Rehabilitative and Restorative Service Providers"

## 2021-06-19 ENCOUNTER — Other Ambulatory Visit: Payer: Self-pay

## 2021-06-19 ENCOUNTER — Ambulatory Visit: Payer: Medicare Other | Admitting: Physical Therapy

## 2021-06-19 DIAGNOSIS — M6281 Muscle weakness (generalized): Secondary | ICD-10-CM | POA: Diagnosis not present

## 2021-06-19 DIAGNOSIS — G8929 Other chronic pain: Secondary | ICD-10-CM

## 2021-06-19 DIAGNOSIS — M25512 Pain in left shoulder: Secondary | ICD-10-CM | POA: Diagnosis not present

## 2021-06-19 DIAGNOSIS — M4125 Other idiopathic scoliosis, thoracolumbar region: Secondary | ICD-10-CM | POA: Diagnosis not present

## 2021-06-19 NOTE — Therapy (Signed)
[facility] @ [place], [place], [address] Phone: [phone]   Fax:  [phone]  Physical Therapy Treatment  Patient Details  Name: MC HOLLEN MRN: [id number] Date of Birth: [birth year] Referring Provider (PT): Dr Melrose Nakayama   Encounter Date: 06/19/2021   PT End of Session - 06/19/21 1701     Visit Number 4    Date for PT Re-Evaluation 07/11/21    Authorization Type UHC Medicare    Progress Note Due on Visit 10    PT Start Time 1530    PT Stop Time 1612    PT Time Calculation (min) 42 min    Activity Tolerance Patient tolerated treatment well             Past Medical History:  Diagnosis Date   Anemia    many yrs ago   Anxiety    Asthma    no recent issues   Cancer ([place])    s/p thyroidectomy 5038   Complication of anesthesia    Cystitis, interstitial    Degenerative disc disease, lumbar    Degenerative joint disease    spinal stenosis "Chronic low back pain"   Depression    Diabetes mellitus    diet control.   Diverticulitis 2005   Diverticulosis 2005   Factor V deficiency ([place])    Fibromyalgia    Fracture of right foot 1995   GERD (gastroesophageal reflux disease)    History of hiatal hernia    Hyperlipidemia    Hypertension    off med x 5 yrs.   IBS (irritable bowel syndrome)    Insomnia    Iron deficiency    Liver hemangioma 1999   MRI   Mononucleosis    Morton's neuroma    Left foot   Peripheral neuropathy    Pneumonia    years 89 & 89.  None since   PONV (postoperative nausea and vomiting)    nausea no vomiting   Rectocele    Transfusion history    child x2, many yrs ago after childbirth"hemorrhage"   Umbilical hernia 8828    Past Surgical History:  Procedure Laterality Date   blood vessel tumor removal  1980   from chin   CHOLECYSTECTOMY     COLONOSCOPY WITH PROPOFOL N/A 09/02/2015   Procedure: COLONOSCOPY WITH PROPOFOL;  Surgeon: Garlan Fair, MD;   [facility]: [facility];  Service: Endoscopy;  Laterality: N/A;   GANGLION CYST EXCISION     right   HEMORRHOID SURGERY     HERNIA REPAIR     repair was aimed at the [place]     and nissen fundoplication   LAPAROSCOPIC ESOPHAGOGASTRIC FUNDOPLASTY     LAPAROSCOPIC INCISIONAL / UMBILICAL / VENTRAL HERNIA REPAIR     umbilical hernia   OOPHORECTOMY     left   RIGHT OOPHORECTOMY     '05-laparaoscopic   SHOULDER SURGERY Right    THYROIDECTOMY N/A 07/06/2016   Procedure: TOTAL THYROIDECTOMY;  Surgeon: Jackolyn Confer, MD;  Location: [facility];  Service: General;  Laterality: N/A;   TONSILLECTOMY     TOTAL THYROIDECTOMY  07/06/2016    There were no vitals filed for this visit.   Subjective Assessment - 06/19/21 1532     Subjective On Saturday I needed to scratch my back and twisted my shoulder to reach it.  It hurt for 2 days.  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    Pertinent History DM, HTN, L rotator cuff tear, R rotator cuff repair in approx 2002    Currently in Pain? No/denies    Pain Score 0-No pain    Pain Location Shoulder    Pain Orientation Left                [facility] PT Assessment - 06/19/21 0001       AROM   Left Shoulder Flexion 160 Degrees    Left Shoulder ABduction 161 Degrees      Strength   Left Shoulder Flexion 4/5    Left Shoulder ABduction 3+/5                           [facility] Adult PT Treatment/Exercise - 06/19/21 0001       Shoulder Exercises: Seated   Elevation Strengthening;Left;10 reps    Elevation Weight (lbs) 2    Elevation Limitations bent elbow resting on table    Other Seated Exercises rows 5# kettlebell 10x    Other Seated Exercises thoracic extension with ball mid back 10x      Shoulder Exercises: Standing   Shoulder Elevation Limitations 10# kettlebell deadlifts 10x    Other Standing Exercises 2# weight slides on wall 10x    Other Standing Exercises yellow  band with washcloth slides on wall 10x      Shoulder Exercises: ROM/Strengthening   Nustep L2 x5 min with PT present to discuss status.    Other ROM/Strengthening Exercises UE Ranger on 1st and 2nd steps 10x each    Other ROM/Strengthening Exercises Pulleys hooked to Matrix with 4# counter weight on right: pull downs with left 3 sets of 10x from 3 angles                       PT Short Term Goals - 06/19/21 1708       PT SHORT TERM GOAL #1   Title Independent with initial HEP    Status Achieved      PT SHORT TERM GOAL #2   Title .Marland KitchenMarland Kitchen               PT Long Term Goals - 06/19/21 1708       PT LONG TERM GOAL #1   Title Pt will be independent with advanced HEP.    Time 8    Period Weeks    Status On-going      PT LONG TERM GOAL #2   Title Pt will increase B shoulder strength to at least 4+/5 to allow her to pick up heavy objects in home.    Time 8    Period Weeks    Status On-going      PT LONG TERM GOAL #3   Title Pt will increase L shoulder flexion and abduction to at least 120 degrees to allow her to reach into overhead cabinets.    Status Achieved      PT LONG TERM GOAL #4   Title Pt will increase FOTO to 59% to allow her to perform more functional activities in her home.    Period Weeks    Status On-going                   Plan - 06/19/21 1702     Clinical Impression Statement Significant improvements in left shoulder flexion and abduction ROM.  She is able to  continue with progressive strengthening to meet her functional goals of lifting a 1/2 gallon of milk or the cat litter.  She reports muscle fatigue with some ex's and mild discomfort on the 10th repetition of shoulder flexion/elevation with the 2# weight.  Therapist monitoring response throughout treatment session.    Personal Factors and Comorbidities Age;Comorbidity 3+    Comorbidities DM, R rotator cuff repair in approx 2002, idopathic scoliosis    Examination-Activity Limitations  Bathing;Reach Overhead;Lift    Rehab Potential Good    PT Frequency 2x / week    PT Duration 8 weeks    PT Treatment/Interventions ADLs/Self Care Home Management;Aquatic Therapy;Cryotherapy;Electrical Stimulation;Iontophoresis 4mg /ml Dexamethasone;Moist Heat;Ultrasound;Functional mobility training;Therapeutic activities;Therapeutic exercise;Neuromuscular re-education;Patient/family education;Manual techniques;Passive range of motion;Dry needling;Taping;Vasopneumatic Device;Joint Manipulations    PT Next Visit Plan assess and progress HEP as indicated, strengthening, ROM    PT Home Exercise Plan Access Code: [email or web address]             Patient will benefit from skilled therapeutic intervention in order to improve the following deficits and impairments:  Decreased range of motion, Impaired UE functional use, Pain, Postural dysfunction, Decreased strength  Visit Diagnosis: Muscle weakness (generalized)  Chronic left shoulder pain     Problem List Patient Active Problem List   Diagnosis Date Noted   Moderate nonproliferative diabetic retinopathy of left eye ([place]) 10/10/2020   Moderate nonproliferative diabetic retinopathy of right eye ([place]) 08/29/2019   Early stage nonexudative age-related macular degeneration of both eyes 08/29/2019   Right posterior capsular opacification 08/29/2019   Nuclear sclerotic cataract of left eye 08/29/2019   Left thyroid nodule 07/06/2016   Benign neoplasm of skin of upper limb, including shoulder 04/19/2012   Ventral hernia, recurrent 01/26/2011   Ruben Im, PT 06/19/21 5:10 PM Phone: [phone] Fax: [phone]  Alvera Singh, PT 06/19/2021, 5:09 PM  [facility] @ [place], [place], [address] Phone: [phone]   Fax:  [phone]  Name: MAZEL VILLELA MRN: [id number] Date of Birth: [birth year]

## 2021-06-24 ENCOUNTER — Ambulatory Visit: Payer: Medicare Other | Admitting: Rehabilitative and Restorative Service Providers"

## 2021-06-24 ENCOUNTER — Telehealth: Payer: Self-pay | Admitting: Rehabilitative and Restorative Service Providers"

## 2021-06-24 NOTE — Telephone Encounter (Signed)
Called pt to follow up on missed visit for 06/24/21 at 12:30 pm.  Pt responded stating that she was sorry, but she had forgotten about her appointment.  Reminded pt about cancellation/missed visit policy and she verbalized her understanding.  Reminded pt about her appointment on 03/27/22 at 11:00 am and to please call to cancel/reshcedule if she is going to be unable to make her scheduled appointment.  She verbalizes understanding.

## 2021-06-27 ENCOUNTER — Ambulatory Visit: Payer: Medicare Other | Admitting: Rehabilitative and Restorative Service Providers"

## 2021-06-27 ENCOUNTER — Encounter: Payer: Self-pay | Admitting: Rehabilitative and Restorative Service Providers"

## 2021-06-27 ENCOUNTER — Other Ambulatory Visit: Payer: Self-pay

## 2021-06-27 DIAGNOSIS — M4125 Other idiopathic scoliosis, thoracolumbar region: Secondary | ICD-10-CM

## 2021-06-27 DIAGNOSIS — M6281 Muscle weakness (generalized): Secondary | ICD-10-CM | POA: Diagnosis not present

## 2021-06-27 DIAGNOSIS — M25512 Pain in left shoulder: Secondary | ICD-10-CM

## 2021-06-27 DIAGNOSIS — G8929 Other chronic pain: Secondary | ICD-10-CM | POA: Diagnosis not present

## 2021-06-27 NOTE — Therapy (Signed)
[facility] @ [place], [place], [address] Phone: [phone]   Fax:  [phone]  Physical Therapy Treatment  Patient Details  Name: Annette Gross MRN: [id number] Date of Birth: August 01, 1940 Referring Provider (PT): Dr Melrose Nakayama   Encounter Date: 06/27/2021   PT End of Session - 06/27/21 1105     Visit Number 5    Date for PT Re-Evaluation 07/11/21    Authorization Type UHC Medicare    Progress Note Due on Visit 10    PT Start Time 1100    PT Stop Time 1140    PT Time Calculation (min) 40 min    Activity Tolerance Patient tolerated treatment well    Behavior During Therapy [facility] for tasks assessed/performed             Past Medical History:  Diagnosis Date   Anemia    many yrs ago   Anxiety    Asthma    no recent issues   Cancer ([place])    s/p thyroidectomy 6269   Complication of anesthesia    Cystitis, interstitial    Degenerative disc disease, lumbar    Degenerative joint disease    spinal stenosis "Chronic low back pain"   Depression    Diabetes mellitus    diet control.   Diverticulitis 2005   Diverticulosis 2005   Factor V deficiency ([place])    Fibromyalgia    Fracture of right foot 1995   GERD (gastroesophageal reflux disease)    History of hiatal hernia    Hyperlipidemia    Hypertension    off med x 5 yrs.   IBS (irritable bowel syndrome)    Insomnia    Iron deficiency    Liver hemangioma 1999   MRI   Mononucleosis    Morton's neuroma    Left foot   Peripheral neuropathy    Pneumonia    years 89 & 89.  None since   PONV (postoperative nausea and vomiting)    nausea no vomiting   Rectocele    Transfusion history    child x2, many yrs ago after childbirth"hemorrhage"   Umbilical hernia 4854    Past Surgical History:  Procedure Laterality Date   blood vessel tumor removal  1980   from chin   CHOLECYSTECTOMY     COLONOSCOPY WITH PROPOFOL N/A 09/02/2015   Procedure:  COLONOSCOPY WITH PROPOFOL;  Surgeon: Garlan Fair, MD;  [facility];  Service: Endoscopy;  Laterality: N/A;   GANGLION CYST EXCISION     right   HEMORRHOID SURGERY     HERNIA REPAIR     repair was aimed at the [place]     and nissen fundoplication   LAPAROSCOPIC ESOPHAGOGASTRIC FUNDOPLASTY     LAPAROSCOPIC INCISIONAL / UMBILICAL / VENTRAL HERNIA REPAIR     umbilical hernia   OOPHORECTOMY     left   RIGHT OOPHORECTOMY     '05-laparaoscopic   SHOULDER SURGERY Right    THYROIDECTOMY N/A 07/06/2016   Procedure: TOTAL THYROIDECTOMY;  Surgeon: Jackolyn Confer, MD;  Location: [facility];  Service: General;  Laterality: N/A;   TONSILLECTOMY     TOTAL THYROIDECTOMY  07/06/2016    There were no vitals filed for this visit.   Subjective Assessment - 06/27/21 1105     Subjective Pt reports that she is doing okay and her shoulder is feeling better.    Pertinent History DM, HTN, L  rotator cuff tear, R rotator cuff repair in approx 2002    Currently in Pain? No/denies                               [facility] Adult PT Treatment/Exercise - 06/27/21 0001       Shoulder Exercises: Supine   Flexion AAROM;Both;20 reps   holding onto cane   Flexion Limitations x10 shoulder flexion with 1# weight and CGA.    ABduction AAROM;Left;20 reps   with cane   Other Supine Exercises chest press/ceiling punch 2x10 reps      Shoulder Exercises: Seated   Row Strengthening;Both;20 reps;Theraband    Theraband Level (Shoulder Row) Level 2 (Red)    Flexion Strengthening;Both;20 reps    Flexion Weight (lbs) 2#    Other Seated Exercises thoracic extension with ball mid back 20x      Shoulder Exercises: Sidelying   External Rotation Strengthening;Left;10 reps    External Rotation Weight (lbs) 1    External Rotation Limitations with CGA for guidance    ABduction Strengthening;Left;10 reps    ABduction Weight (lbs) 1    ABduction Limitations with CGA for guidance       Shoulder Exercises: ROM/Strengthening   Nustep L2 x5 min with PT present to discuss status.      Manual Therapy   Manual Therapy Passive ROM    Passive ROM to L shoulder in all planes of motion                       PT Short Term Goals - 06/19/21 1708       PT SHORT TERM GOAL #1   Title Independent with initial HEP    Status Achieved      PT SHORT TERM GOAL #2   Title .Marland KitchenMarland Kitchen               PT Long Term Goals - 06/27/21 1151       PT LONG TERM GOAL #1   Title Pt will be independent with advanced HEP.    Status On-going      PT LONG TERM GOAL #2   Title Pt will increase B shoulder strength to at least 4+/5 to allow her to pick up heavy objects in home.    Status On-going      PT LONG TERM GOAL #3   Title Pt will increase L shoulder flexion and abduction to at least 120 degrees to allow her to reach into overhead cabinets.    Status On-going      PT LONG TERM GOAL #4   Title Pt will increase FOTO to 59% to allow her to perform more functional activities in her home.    Status On-going                   Plan - 06/27/21 1148     Clinical Impression Statement P/ROM of left shoulder is WFL, however, with A/ROM pt has increased pain and decreased ROM noted.  Pt continues to progress with gradual strengthening during therapy session and requires PT hand over hand guidance during some activities for proper mechanics. Pt continues to require skilled PT to progress towards goal related activites and her ability to lift cat litter again.    Personal Factors and Comorbidities Age;Comorbidity 3+    Comorbidities DM, R rotator cuff repair in approx 2002, idopathic scoliosis    PT Treatment/Interventions ADLs/Self Care  Home Management;Aquatic Therapy;Cryotherapy;Electrical Stimulation;Iontophoresis 4mg /ml Dexamethasone;Moist Heat;Ultrasound;Functional mobility training;Therapeutic activities;Therapeutic exercise;Neuromuscular re-education;Patient/family  education;Manual techniques;Passive range of motion;Dry needling;Taping;Vasopneumatic Device;Joint Manipulations    PT Next Visit Plan assess and progress HEP as indicated, strengthening, ROM    PT Home Exercise Plan Access Code: [email or web address]    Consulted and Agree with Plan of Care Patient             Patient will benefit from skilled therapeutic intervention in order to improve the following deficits and impairments:  Decreased range of motion, Impaired UE functional use, Pain, Postural dysfunction, Decreased strength  Visit Diagnosis: Muscle weakness (generalized)  Chronic left shoulder pain  Other idiopathic scoliosis, thoracolumbar region     Problem List Patient Active Problem List   Diagnosis Date Noted   Moderate nonproliferative diabetic retinopathy of left eye (HCC) 10/10/2020   Moderate nonproliferative diabetic retinopathy of right eye ([place]) 08/29/2019   Early stage nonexudative age-related macular degeneration of both eyes 08/29/2019   Right posterior capsular opacification 08/29/2019   Nuclear sclerotic cataract of left eye 08/29/2019   Left thyroid nodule 07/06/2016   Benign neoplasm of skin of upper limb, including shoulder 04/19/2012   Ventral hernia, recurrent 01/26/2011    Juel Burrow, PT 06/27/2021, 11:56 AM  [facility] @ [place], [place], [address] Phone: [phone]   Fax:  [phone]  Name: Annette Gross MRN: [id number] Date of Birth: 1940-08-12

## 2021-07-02 ENCOUNTER — Encounter: Payer: Self-pay | Admitting: Rehabilitative and Restorative Service Providers"

## 2021-07-02 ENCOUNTER — Ambulatory Visit: Payer: Medicare Other | Attending: Orthopaedic Surgery | Admitting: Rehabilitative and Restorative Service Providers"

## 2021-07-02 ENCOUNTER — Other Ambulatory Visit: Payer: Self-pay

## 2021-07-02 DIAGNOSIS — R2689 Other abnormalities of gait and mobility: Secondary | ICD-10-CM | POA: Insufficient documentation

## 2021-07-02 DIAGNOSIS — M5441 Lumbago with sciatica, right side: Secondary | ICD-10-CM | POA: Insufficient documentation

## 2021-07-02 DIAGNOSIS — M4125 Other idiopathic scoliosis, thoracolumbar region: Secondary | ICD-10-CM | POA: Diagnosis not present

## 2021-07-02 DIAGNOSIS — M25512 Pain in left shoulder: Secondary | ICD-10-CM | POA: Insufficient documentation

## 2021-07-02 DIAGNOSIS — M5442 Lumbago with sciatica, left side: Secondary | ICD-10-CM | POA: Insufficient documentation

## 2021-07-02 DIAGNOSIS — M6281 Muscle weakness (generalized): Secondary | ICD-10-CM | POA: Insufficient documentation

## 2021-07-02 DIAGNOSIS — G8929 Other chronic pain: Secondary | ICD-10-CM | POA: Insufficient documentation

## 2021-07-02 NOTE — Therapy (Signed)
[facility] ?[facility] ?[place], [place], [address] ?Phone: [phone]   Fax:  [phone] ? ?Physical Therapy Treatment ? ?Patient Details  ?Name: Annette Gross ?MRN: [id number] ?Date of Birth: 1940/10/06 ?Referring Provider (PT): Dr Melrose Nakayama ? ? ?Encounter Date: 07/02/2021 ? ? PT End of Session - 07/02/21 1037   ? ? Visit Number 6   ? Date for PT Re-Evaluation 07/11/21   ? Authorization Type UHC Medicare   ? Progress Note Due on Visit 10   ? PT Start Time 1026   ? PT Stop Time 1055   ? PT Time Calculation (min) 29 min   ? Activity Tolerance Patient tolerated treatment well   ? Behavior During Therapy [facility] for tasks assessed/performed   ? ?  ?  ? ?  ? ? ?Past Medical History:  ?Diagnosis Date  ? Anemia   ? many yrs ago  ? Anxiety   ? Asthma   ? no recent issues  ? Cancer [facility])   ? s/p thyroidectomy 2018  ? Complication of anesthesia   ? Cystitis, interstitial   ? Degenerative disc disease, lumbar   ? Degenerative joint disease   ? spinal stenosis "Chronic low back pain"  ? Depression   ? Diabetes mellitus   ? diet control.  ? Diverticulitis 2005  ? Diverticulosis 2005  ? Factor V deficiency ([place])   ? Fibromyalgia   ? Fracture of right foot 1995  ? GERD (gastroesophageal reflux disease)   ? History of hiatal hernia   ? Hyperlipidemia   ? Hypertension   ? off med x 5 yrs.  ? IBS (irritable bowel syndrome)   ? Insomnia   ? Iron deficiency   ? Liver hemangioma 1999  ? MRI  ? Mononucleosis   ? Morton's neuroma   ? Left foot  ? Peripheral neuropathy   ? Pneumonia   ? years 54 & 27.  None since  ? PONV (postoperative nausea and vomiting)   ? nausea no vomiting  ? Rectocele   ? Transfusion history   ? child x2, many yrs ago after childbirth"hemorrhage"  ? Umbilical hernia 0102  ? ? ?Past Surgical History:  ?Procedure Laterality Date  ? blood vessel tumor removal  1980  ? from chin  ? CHOLECYSTECTOMY    ? COLONOSCOPY WITH PROPOFOL N/A 09/02/2015  ? Procedure:  COLONOSCOPY WITH PROPOFOL;  Surgeon: Garlan Fair, MD;  Location: [facility];  Service: Endoscopy;  Laterality: N/A;  ? GANGLION CYST EXCISION    ? right  ? HEMORRHOID SURGERY    ? HERNIA REPAIR    ? repair was aimed at the [facility]  ? [place]    ? and nissen fundoplication  ? LAPAROSCOPIC ESOPHAGOGASTRIC FUNDOPLASTY    ? LAPAROSCOPIC INCISIONAL / UMBILICAL / VENTRAL HERNIA REPAIR    ? umbilical hernia  ? OOPHORECTOMY    ? left  ? RIGHT OOPHORECTOMY    ? '05-laparaoscopic  ? SHOULDER SURGERY Right   ? THYROIDECTOMY N/A 07/06/2016  ? Procedure: TOTAL THYROIDECTOMY;  Surgeon: Jackolyn Confer, MD;  Location: [place];  Service: General;  Laterality: N/A;  ? TONSILLECTOMY    ? TOTAL THYROIDECTOMY  07/06/2016  ? ? ?There were no vitals filed for this visit. ? ? Subjective Assessment - 07/02/21 1038   ? ? Subjective Pt reports having more back pain today than her shoulder.   ? Pertinent History DM, HTN, L rotator cuff tear,  R rotator cuff repair in approx 2002   ? Patient Stated Goals To strengthen my arms.   ? Currently in Pain? Yes   ? Pain Score 6    ? Pain Location Back   ? Pain Orientation Lower   ? Pain Descriptors / Indicators Aching;Dull   ? Pain Type Chronic pain   ? ?  ?  ? ?  ? ? ? ? ? ? ? ? ? ? ? ? ? ? ? ? ? ? ? ? [place] Adult PT Treatment/Exercise - 07/02/21 0001   ? ?  ? Shoulder Exercises: Seated  ? Flexion Strengthening;Both;20 reps   ? Flexion Weight (lbs) 2#   ? Other Seated Exercises 3 way pball rollout with green ball 5 sec x5 each   ? Other Seated Exercises thoracic extension with ball mid back 20x   ?  ? Shoulder Exercises: Standing  ? External Rotation Strengthening;Left;20 reps;Theraband   ? Theraband Level (Shoulder External Rotation) Level 2 (Red)   ? Internal Rotation Strengthening;Left;20 reps;Theraband   ? Theraband Level (Shoulder Internal Rotation) Level 2 (Red)   ? Extension Strengthening;Both;20 reps   ? Theraband Level (Shoulder Extension) Level 2 (Red)   ? Row Strengthening;Both;20  reps   ? Theraband Level (Shoulder Row) Level 2 (Red)   ?  ? Shoulder Exercises: ROM/Strengthening  ? Nustep L4 x6 min with PT present to discuss status.   ? ?  ?  ? ?  ? ? ? ? ? ? ? ? ? ? ? ? PT Short Term Goals - 06/19/21 1708   ? ?  ? PT SHORT TERM GOAL #1  ? Title Independent with initial HEP   ? Status Achieved   ?  ? PT SHORT TERM GOAL #2  ? Title ...   ? ?  ?  ? ?  ? ? ? ? PT Long Term Goals - 07/02/21 1107   ? ?  ? PT LONG TERM GOAL #1  ? Title Pt will be independent with advanced HEP.   ? Status On-going   ?  ? PT LONG TERM GOAL #2  ? Title Pt will increase B shoulder strength to at least 4+/5 to allow her to pick up heavy objects in home.   ? Status On-going   ? ?  ?  ? ?  ? ? ? ? ? ? ? ? Plan - 07/02/21 1105   ? ? Clinical Impression Statement Ms Citro arrived late for her appointment today, stating that she was paying her bills and lost track of time.  Pt able to progress up to red theraband today for shoulder exercises.  Added physioball rollout to assist with AA/ROM for shoulders and back with gentle stretch and pt reported feeling better following.  Pt able to perform standing ther ex with band with cuing for maintaining core stability throughout.  Pt continues to require skilled PT to progress towards goal related activities.   ? Personal Factors and Comorbidities Age;Comorbidity 3+   ? Comorbidities DM, R rotator cuff repair in approx 2002, idopathic scoliosis   ? PT Treatment/Interventions ADLs/Self Care Home Management;Aquatic Therapy;Cryotherapy;Electrical Stimulation;Iontophoresis 4mg /ml Dexamethasone;Moist Heat;Ultrasound;Functional mobility training;Therapeutic activities;Therapeutic exercise;Neuromuscular re-education;Patient/family education;Manual techniques;Passive range of motion;Dry needling;Taping;Vasopneumatic Device;Joint Manipulations   ? PT Next Visit Plan assess and progress HEP as indicated, strengthening, ROM, progress to aquatic therapy as indicated   ? PT Home Exercise Plan  Access Code: [email or web address]   ? Consulted and Agree with Plan of Care Patient   ? ?  ?  ? ?  ? ? ?  Patient will benefit from skilled therapeutic intervention in order to improve the following deficits and impairments:  Decreased range of motion, Impaired UE functional use, Pain, Postural dysfunction, Decreased strength ? ?Visit Diagnosis: ?Muscle weakness (generalized) ? ?Chronic left shoulder pain ? ?Other idiopathic scoliosis, thoracolumbar region ? ? ? ? ?Problem List ?Patient Active Problem List  ? Diagnosis Date Noted  ? Moderate nonproliferative diabetic retinopathy of left eye ([place]) 10/10/2020  ? Moderate nonproliferative diabetic retinopathy of right eye ([place]) 08/29/2019  ? Early stage nonexudative age-related macular degeneration of both eyes 08/29/2019  ? Right posterior capsular opacification 08/29/2019  ? Nuclear sclerotic cataract of left eye 08/29/2019  ? Left thyroid nodule 07/06/2016  ? Benign neoplasm of skin of upper limb, including shoulder 04/19/2012  ? Ventral hernia, recurrent 01/26/2011  ? ? ?Shelby Dubin Mikhaila Roh, PT ?07/02/2021, 11:08 AM ? ?[facility] ?[facility] [place] ?[place], [place], [address] ?Phone: [phone]   Fax:  [phone] ? ?Name: LYNDALL BELLOT ?MRN: [id number] ?Date of Birth: 1940/06/05 ? ? ? ?

## 2021-07-04 ENCOUNTER — Encounter: Payer: Self-pay | Admitting: Rehabilitative and Restorative Service Providers"

## 2021-07-04 ENCOUNTER — Other Ambulatory Visit: Payer: Self-pay

## 2021-07-04 ENCOUNTER — Ambulatory Visit: Payer: Medicare Other | Admitting: Rehabilitative and Restorative Service Providers"

## 2021-07-04 DIAGNOSIS — R2689 Other abnormalities of gait and mobility: Secondary | ICD-10-CM | POA: Diagnosis not present

## 2021-07-04 DIAGNOSIS — M4125 Other idiopathic scoliosis, thoracolumbar region: Secondary | ICD-10-CM | POA: Diagnosis not present

## 2021-07-04 DIAGNOSIS — M6281 Muscle weakness (generalized): Secondary | ICD-10-CM

## 2021-07-04 DIAGNOSIS — M5442 Lumbago with sciatica, left side: Secondary | ICD-10-CM | POA: Diagnosis not present

## 2021-07-04 DIAGNOSIS — M25512 Pain in left shoulder: Secondary | ICD-10-CM | POA: Diagnosis not present

## 2021-07-04 DIAGNOSIS — G8929 Other chronic pain: Secondary | ICD-10-CM

## 2021-07-04 DIAGNOSIS — M5441 Lumbago with sciatica, right side: Secondary | ICD-10-CM | POA: Diagnosis not present

## 2021-07-04 NOTE — Therapy (Signed)
OUTPATIENT PHYSICAL THERAPY SHOULDER RE-EVALUATION   Patient Name: Annette Gross MRN: [id number] DOB:[birth year], 81 y.o., female Today's Date: 07/04/2021   PT End of Session - 07/04/21 0940     Visit Number 7    Date for PT Re-Evaluation 09/05/21    Authorization Type UHC Medicare    Progress Note Due on Visit 10    PT Start Time 0930    PT Stop Time 1010    PT Time Calculation (min) 40 min    Activity Tolerance Patient tolerated treatment well    Behavior During Therapy WFL for tasks assessed/performed             Past Medical History:  Diagnosis Date   Anemia    many yrs ago   Anxiety    Asthma    no recent issues   Cancer ([place])    s/p thyroidectomy 9675   Complication of anesthesia    Cystitis, interstitial    Degenerative disc disease, lumbar    Degenerative joint disease    spinal stenosis "Chronic low back pain"   Depression    Diabetes mellitus    diet control.   Diverticulitis 2005   Diverticulosis 2005   Factor V deficiency ([place])    Fibromyalgia    Fracture of right foot 1995   GERD (gastroesophageal reflux disease)    History of hiatal hernia    Hyperlipidemia    Hypertension    off med x 5 yrs.   IBS (irritable bowel syndrome)    Insomnia    Iron deficiency    Liver hemangioma 1999   MRI   Mononucleosis    Morton's neuroma    Left foot   Peripheral neuropathy    Pneumonia    years 89 & 89.  None since   PONV (postoperative nausea and vomiting)    nausea no vomiting   Rectocele    Transfusion history    child x2, many yrs ago after childbirth"hemorrhage"   Umbilical hernia 9163   Past Surgical History:  Procedure Laterality Date   blood vessel tumor removal  1980   from chin   CHOLECYSTECTOMY     COLONOSCOPY WITH PROPOFOL N/A 09/02/2015   Procedure: COLONOSCOPY WITH PROPOFOL;  Surgeon: Garlan Fair, MD;  [facility]: [facility];  Service: Endoscopy;  Laterality: N/A;   GANGLION CYST EXCISION     right   HEMORRHOID SURGERY      HERNIA REPAIR     repair was aimed at the [place]     and nissen fundoplication   LAPAROSCOPIC ESOPHAGOGASTRIC FUNDOPLASTY     LAPAROSCOPIC INCISIONAL / UMBILICAL / VENTRAL HERNIA REPAIR     umbilical hernia   OOPHORECTOMY     left   RIGHT OOPHORECTOMY     '05-laparaoscopic   SHOULDER SURGERY Right    THYROIDECTOMY N/A 07/06/2016   Procedure: TOTAL THYROIDECTOMY;  Surgeon: Jackolyn Confer, MD;  Location: [facility];  Service: General;  Laterality: N/A;   TONSILLECTOMY     TOTAL THYROIDECTOMY  07/06/2016   Patient Active Problem List   Diagnosis Date Noted   Moderate nonproliferative diabetic retinopathy of left eye ([place]) 10/10/2020   Moderate nonproliferative diabetic retinopathy of right eye ([place]) 08/29/2019   Early stage nonexudative age-related macular degeneration of both eyes 08/29/2019   Right posterior capsular opacification 08/29/2019   Nuclear sclerotic cataract of left eye 08/29/2019   Left thyroid nodule 07/06/2016   Benign neoplasm of skin of upper limb,  including shoulder 04/19/2012   Ventral hernia, recurrent 01/26/2011    PCP: Lavone Orn, MD  REFERRING PROVIDER: Melrose Nakayama, MD  REFERRING DIAG: [phone] (ICD-10-CM) - Left rotator cuff tear   THERAPY DIAG:  Muscle weakness (generalized)  Chronic left shoulder pain  Other idiopathic scoliosis, thoracolumbar region  Other abnormalities of gait and mobility  Chronic low back pain with bilateral sciatica, unspecified back pain laterality   ONSET DATE: 04/30/2021   SUBJECTIVE:                                                                                                                                                                                      SUBJECTIVE STATEMENT: My back is feeling better, I'm not sure why.  My shoulder doesn't hurt today.  PERTINENT HISTORY: DM, HTN, L rotator cuff tear, R rotator cuff repair in approx 2002   PAIN:  Are you having pain? Yes NPRS scale:  2/10 Pain location: back Pain orientation: Lower  PAIN TYPE: aching Pain description: intermittent  Aggravating factors: activity Relieving factors: sitting/rest  PRECAUTIONS: Fall  WEIGHT BEARING RESTRICTIONS No  FALLS:  Has patient fallen in last 6 months? No Number of falls: 0  LIVING ENVIRONMENT: Lives with: lives alone Lives in: House/apartment Stairs: No;  Has following equipment at home: Environmental consultant - 4 wheeled and Grab bars  OCCUPATION: retired  PLOF: Independent  PATIENT GOALS:  To strengthen my arms  OBJECTIVE:   DIAGNOSTIC FINDINGS:  X-ray revealed bruised rotator cuff  PATIENT SURVEYS:  FOTO 54% (projected 59% by visit 11)  COGNITION:  Overall cognitive status: Within functional limits for tasks assessed     POSTURE: Forward flexed, rounded shoulders, scoliosis  UPPER EXTREMITY AROM/PROM: Measured in sitting on 07/04/2021  A/PROM Right 07/04/2021 Left 07/04/2021  Shoulder flexion 145 130  Shoulder extension    Shoulder abduction 145 125  Shoulder adduction    Shoulder internal rotation    Shoulder external rotation    Elbow flexion    Elbow extension    Wrist flexion    Wrist extension    Wrist ulnar deviation    Wrist radial deviation    Wrist pronation    Wrist supination    (Blank rows = not tested)  UPPER EXTREMITY MMT: L shoulder strength of 4-/5 grossly throughout   TODAY'S TREATMENT:   07/04/2021: UBE level 1.0 x3 min each direction with PT present to discuss status Seated:  scapular retraction, flexion and abduction and bicep curls with 2#. 2x10 B each Seated AA/ROM with 2# on cane flexion 2x10 bilat Seated AA/ROM with cane for abduction LUE 2x10 Wall push ups 2x10 Thoracic extension with ball behind back  2x10   PATIENT EDUCATION: Education details: Reviewed HEP Person educated: Patient Education method: Explanation Education comprehension: verbalized understanding   HOME EXERCISE PROGRAM: Access Code: [email or web address] URL:  [email or web address] Date: 07/04/2021 Prepared by: Shelby Dubin Cyndee Giammarco  Exercises Seated Scapular Retraction - 1-2 x daily - 7 x weekly - 2 sets - 10 reps Seated Shoulder Flexion Towel Slide at Table Top - 1-2 x daily - 7 x weekly - 2 sets - 10 reps Seated Shoulder Abduction Towel Slide at Table Top - 1-2 x daily - 7 x weekly - 2 sets - 10 reps Flexion-Extension Shoulder Pendulum with Table Support - 1 x daily - 7 x weekly - 2 sets - 10 reps Standing Single Arm Row with Resistance Thumb Up (Mirrored) - 1 x daily - 7 x weekly - 1 sets - 10 reps Single Arm Shoulder Extension with Anchored Resistance (Mirrored) - 1 x daily - 7 x weekly - 1 sets - 10 reps Shoulder External Rotation with Anchored Resistance - 1 x daily - 7 x weekly - 1 sets - 10 reps Standing Shoulder Internal Rotation with Anchored Resistance - 1 x daily - 7 x weekly - 1 sets - 10 reps Seated Single Arm Elbow Flexion with Resistance - 1 x daily - 7 x weekly - 1 sets - 10 reps Seated Thoracic Lumbar Extension with Pectoralis Stretch - 1 x daily - 7 x weekly - 1 sets - 10 reps   ASSESSMENT:  CLINICAL IMPRESSION: Patient is an 81 y.o. female who was seen today for physical therapy re-evaluation and treatment for L rotator cuff tear. Since initial evaluation, pt has increased in her A/ROM of left shoulder in seated position.  Pt reports that she has been able to lift her kitty litter if she uses both hands, but is having difficulty with tasks above shoulder height with LUE.  Pt continues to require an additional 2x/week for 8 weeks to progress with goal related activities and improved functional use of L shoulder.   OBJECTIVE IMPAIRMENTS decreased activity tolerance, decreased balance, difficulty walking, decreased ROM, decreased strength, impaired UE functional use, postural dysfunction, and pain.   ACTIVITY LIMITATIONS cleaning, community activity, and driving.   PERSONAL FACTORS Age and 3+ comorbidities: DM, R rotator  cuff repair in approx 2002, idopathic scoliosis  are also affecting patient's functional outcome.    REHAB POTENTIAL: Good  CLINICAL DECISION MAKING: Evolving/moderate complexity  EVALUATION COMPLEXITY: Moderate   GOALS: Goals reviewed with patient? Yes  SHORT TERM GOALS:  Independent with initial HEP. Baseline:  Target date: 07/18/2021 Goal status: MET   LONG TERM GOALS:  Pt will be independent with advanced HEP. Baseline:  Target date: 08/29/2021 Goal status: IN PROGRESS  2.  Pt will increase B shoulder strength to at least 4+/5 to allow her to pick up heavy objects in home. Baseline:  Target date: 08/29/2021 Goal status: IN PROGRESS  3.  Pt will increase L shoulder flexion and abduction to at least 130 degrees while holding a household item to allow her to reach into overhead cabinets. Baseline: 114 degrees flexion, 87 degrees abduction Target date: 08/29/2021 Goal status: IN PROGRESS  4.  Pt will increase FOTO to 59% to allow her to perform more functional activities in her home. Baseline: 52% Target date: 08/29/2021 Goal status: IN PROGRESS   PLAN: PT FREQUENCY: 2x/week  PT DURATION: 8 weeks  PLANNED INTERVENTIONS: Therapeutic exercises, Therapeutic activity, Neuromuscular re-education, Balance training, Gait training, Patient/Family education, Joint mobilization, Stair training, Aquatic Therapy, Dry  Needling, Electrical stimulation, Spinal manipulation, Spinal mobilization, Cryotherapy, Moist heat, Taping, Vasopneumatic device, Traction, Ultrasound, Ionotophoresis 26m/ml Dexamethasone, and Manual therapy  PLAN FOR NEXT SESSION: assess and progress HEP as indicated, strengthening, ROM, progress to aquatic therapy as indicated   Damichael Hofman, PT 07/04/2021, 10:39 AM

## 2021-07-07 DIAGNOSIS — R1904 Left lower quadrant abdominal swelling, mass and lump: Secondary | ICD-10-CM | POA: Diagnosis not present

## 2021-07-08 ENCOUNTER — Other Ambulatory Visit: Payer: Self-pay

## 2021-07-08 ENCOUNTER — Ambulatory Visit: Payer: Medicare Other | Admitting: Physical Therapy

## 2021-07-08 DIAGNOSIS — M25512 Pain in left shoulder: Secondary | ICD-10-CM | POA: Diagnosis not present

## 2021-07-08 DIAGNOSIS — M5442 Lumbago with sciatica, left side: Secondary | ICD-10-CM | POA: Diagnosis not present

## 2021-07-08 DIAGNOSIS — M5441 Lumbago with sciatica, right side: Secondary | ICD-10-CM | POA: Diagnosis not present

## 2021-07-08 DIAGNOSIS — M6281 Muscle weakness (generalized): Secondary | ICD-10-CM

## 2021-07-08 DIAGNOSIS — M4125 Other idiopathic scoliosis, thoracolumbar region: Secondary | ICD-10-CM

## 2021-07-08 DIAGNOSIS — R2689 Other abnormalities of gait and mobility: Secondary | ICD-10-CM | POA: Diagnosis not present

## 2021-07-08 DIAGNOSIS — G8929 Other chronic pain: Secondary | ICD-10-CM

## 2021-07-08 NOTE — Therapy (Addendum)
OUTPATIENT PHYSICAL THERAPY SHOULDER TREATMENT   Patient Name: Annette Gross MRN: [id number] DOB:1940/06/14, 81 y.o., female Today's Date: 07/08/2021   PT End of Session - 07/08/21 1144     Visit Number 8    Date for PT Re-Evaluation 09/05/21    Authorization Type [employer] Medicare    Progress Note Due on Visit 10    PT Start Time 1145    PT Stop Time 1225    PT Time Calculation (min) 40 min    Activity Tolerance Patient tolerated treatment well             Past Medical History:  Diagnosis Date   Anemia    many yrs ago   Anxiety    Asthma    no recent issues   Cancer ([place])    s/p thyroidectomy 4580   Complication of anesthesia    Cystitis, interstitial    Degenerative disc disease, lumbar    Degenerative joint disease    spinal stenosis "Chronic low back pain"   Depression    Diabetes mellitus    diet control.   Diverticulitis 2005   Diverticulosis 2005   Factor V deficiency ([place])    Fibromyalgia    Fracture of right foot 1995   GERD (gastroesophageal reflux disease)    History of hiatal hernia    Hyperlipidemia    Hypertension    off med x 5 yrs.   IBS (irritable bowel syndrome)    Insomnia    Iron deficiency    Liver hemangioma 1999   MRI   Mononucleosis    Morton's neuroma    Left foot   Peripheral neuropathy    Pneumonia    years 89 & 89.  None since   PONV (postoperative nausea and vomiting)    nausea no vomiting   Rectocele    Transfusion history    child x2, many yrs ago after childbirth"hemorrhage"   Umbilical hernia 9983   Past Surgical History:  Procedure Laterality Date   blood vessel tumor removal  1980   from chin   CHOLECYSTECTOMY     COLONOSCOPY WITH PROPOFOL N/A 09/02/2015   Procedure: COLONOSCOPY WITH PROPOFOL;  Surgeon: Garlan Fair, MD;  Location: [facility];  Service: Endoscopy;  Laterality: N/A;   GANGLION CYST EXCISION     right   HEMORRHOID SURGERY     HERNIA REPAIR     repair was aimed at the [place]     and nissen fundoplication   LAPAROSCOPIC ESOPHAGOGASTRIC FUNDOPLASTY     LAPAROSCOPIC INCISIONAL / UMBILICAL / VENTRAL HERNIA REPAIR     umbilical hernia   OOPHORECTOMY     left   RIGHT OOPHORECTOMY     '05-laparaoscopic   SHOULDER SURGERY Right    THYROIDECTOMY N/A 07/06/2016   Procedure: TOTAL THYROIDECTOMY;  Surgeon: Jackolyn Confer, MD;  Location: [facility];  Service: General;  Laterality: N/A;   TONSILLECTOMY     TOTAL THYROIDECTOMY  07/06/2016   Patient Active Problem List   Diagnosis Date Noted   Moderate nonproliferative diabetic retinopathy of left eye ([place]) 10/10/2020   Moderate nonproliferative diabetic retinopathy of right eye ([place]) 08/29/2019   Early stage nonexudative age-related macular degeneration of both eyes 08/29/2019   Right posterior capsular opacification 08/29/2019   Nuclear sclerotic cataract of left eye 08/29/2019   Left thyroid nodule 07/06/2016   Benign neoplasm of skin of upper limb, including shoulder 04/19/2012   Ventral hernia, recurrent 01/26/2011  PCP: Lavone Orn, MD  REFERRING PROVIDER: Lavone Orn, MD  REFERRING DIAG: [phone] (ICD-10-CM) - Left rotator cuff tear   THERAPY DIAG:  Muscle weakness (generalized)  Chronic left shoulder pain  Other idiopathic scoliosis, thoracolumbar region   ONSET DATE: 04/30/2021   SUBJECTIVE:                                                                                                                                                                                      SUBJECTIVE STATEMENT:  Things are going really well.  Back is better than it was.  Less cracking in my shoulders.  My knee buckles sometimes. Discussed using a cane for stability.   PERTINENT HISTORY: DM, HTN, L rotator cuff tear, R rotator cuff repair in approx 2002   PAIN:  Are you having pain? No NPRS scale: 0/10 Pain location: back Pain orientation: Lower  PAIN TYPE: aching Pain description: intermittent   Aggravating factors: activity Relieving factors: sitting/rest  PRECAUTIONS: Fall  WEIGHT BEARING RESTRICTIONS No  FALLS:  Has patient fallen in last 6 months? No Number of falls: 0  LIVING ENVIRONMENT: Lives with: lives alone Lives in: House/apartment Stairs: No;  Has following equipment at home: Environmental consultant - 4 wheeled and Grab bars  OCCUPATION: retired  PLOF: Independent  PATIENT GOALS:  To strengthen my arms  OBJECTIVE:   DIAGNOSTIC FINDINGS:  X-ray revealed bruised rotator cuff  PATIENT SURVEYS:  FOTO 54% (projected 59% by visit 11)  COGNITION:  Overall cognitive status: Within functional limits for tasks assessed     POSTURE: Forward flexed, rounded shoulders, scoliosis  UPPER EXTREMITY AROM/PROM: Measured in sitting on 07/04/2021  A/PROM Right 07/08/2021 Left 07/08/2021  Shoulder flexion 145 130  Shoulder extension    Shoulder abduction 145 125  Shoulder adduction    Shoulder internal rotation    Shoulder external rotation    Elbow flexion    Elbow extension    Wrist flexion    Wrist extension    Wrist ulnar deviation    Wrist radial deviation    Wrist pronation    Wrist supination    (Blank rows = not tested)  UPPER EXTREMITY MMT: L shoulder strength of 4-/5 grossly throughout   TODAY'S TREATMENT:  07/08/2021: Nu-Step L1 seat 7 5 min while discussing status/progress Seated:  bicep curls with 2#. 2x10 B each Standing pulleys with 4# counter weight flex and abduction 15-20x each Seated red band internal and external rotation 10x each; triceps extension red band 10x; red band forward press with left 10x  Seated AA/ROM with 2# on cane flexion x10 bilat Cane on table top presses 2x5  Counter top push ups 2x10 Thoracic extension with ball behind back 2x10 07/04/2021: UBE level 1.0 x3 min each direction with PT present to discuss status Seated:  scapular retraction, flexion and abduction and bicep curls with 2#. 2x10 B each Seated AA/ROM with 2# on  cane flexion 2x10 bilat Seated AA/ROM with cane for abduction LUE 2x10 Wall push ups 2x10 Thoracic extension with ball behind back 2x10   PATIENT EDUCATION: Education details: Reviewed HEP Person educated: Patient Education method: Explanation Education comprehension: verbalized understanding   HOME EXERCISE PROGRAM: Access Code: [email or web address] URL: [email or web address] Date: 07/04/2021 Prepared by: Shelby Dubin Menke  Exercises Seated Scapular Retraction - 1-2 x daily - 7 x weekly - 2 sets - 10 reps Seated Shoulder Flexion Towel Slide at Table Top - 1-2 x daily - 7 x weekly - 2 sets - 10 reps Seated Shoulder Abduction Towel Slide at Table Top - 1-2 x daily - 7 x weekly - 2 sets - 10 reps Flexion-Extension Shoulder Pendulum with Table Support - 1 x daily - 7 x weekly - 2 sets - 10 reps Standing Single Arm Row with Resistance Thumb Up (Mirrored) - 1 x daily - 7 x weekly - 1 sets - 10 reps Single Arm Shoulder Extension with Anchored Resistance (Mirrored) - 1 x daily - 7 x weekly - 1 sets - 10 reps Shoulder External Rotation with Anchored Resistance - 1 x daily - 7 x weekly - 1 sets - 10 reps Standing Shoulder Internal Rotation with Anchored Resistance - 1 x daily - 7 x weekly - 1 sets - 10 reps Seated Single Arm Elbow Flexion with Resistance - 1 x daily - 7 x weekly - 1 sets - 10 reps Seated Thoracic Lumbar Extension with Pectoralis Stretch - 1 x daily - 7 x weekly - 1 sets - 10 reps   ASSESSMENT:  CLINICAL IMPRESSION: The patient is able to progress exercise intensity with added resistance, weight bearing and reps.  She reports intermittent clicking but no pain in her shoulder.  Therapist monitoring response to all interventions and modifying treatment accordingly.    OBJECTIVE IMPAIRMENTS decreased activity tolerance, decreased balance, difficulty walking, decreased ROM, decreased strength, impaired UE functional use, postural dysfunction, and pain.   ACTIVITY LIMITATIONS  cleaning, community activity, and driving.   PERSONAL FACTORS Age and 3+ comorbidities: DM, R rotator cuff repair in approx 2002, idopathic scoliosis  are also affecting patient's functional outcome.    REHAB POTENTIAL: Good  CLINICAL DECISION MAKING: Evolving/moderate complexity  EVALUATION COMPLEXITY: Moderate   GOALS: Goals reviewed with patient? Yes  SHORT TERM GOALS:  Independent with initial HEP. Baseline:  Target date: 07/22/2021 Goal status: MET   LONG TERM GOALS:  Pt will be independent with advanced HEP. Baseline:  Target date: 09/02/2021 Goal status: IN PROGRESS  2.  Pt will increase B shoulder strength to at least 4+/5 to allow her to pick up heavy objects in home. Baseline:  Target date: 09/02/2021 Goal status: IN PROGRESS  3.  Pt will increase L shoulder flexion and abduction to at least 130 degrees while holding a household item to allow her to reach into overhead cabinets. Baseline: 114 degrees flexion, 87 degrees abduction Target date: 09/02/2021 Goal status: IN PROGRESS  4.  Pt will increase FOTO to 59% to allow her to perform more functional activities in her home. Baseline: 52% Target date: 09/02/2021 Goal status: IN PROGRESS   PLAN: PT FREQUENCY: 2x/week  PT DURATION: 8 weeks  PLANNED INTERVENTIONS: Therapeutic exercises,  Therapeutic activity, Neuromuscular re-education, Balance training, Gait training, Patient/Family education, Joint mobilization, Stair training, Aquatic Therapy, Dry Needling, Electrical stimulation, Spinal manipulation, Spinal mobilization, Cryotherapy, Moist heat, Taping, Vasopneumatic device, Traction, Ultrasound, Ionotophoresis 61m/ml Dexamethasone, and Manual therapy  PLAN FOR NEXT SESSION: check cane height/usage if she gets a cane; assess and progress HEP as indicated, strengthening, ROM, progress to aquatic therapy as indicated  SRuben Im PT 07/08/21 12:29 PM Phone: [phone]: [phone] SAlvera Singh  PT 07/08/2021, 11:45 AM

## 2021-07-09 ENCOUNTER — Encounter: Payer: Self-pay | Admitting: Physical Therapy

## 2021-07-09 ENCOUNTER — Ambulatory Visit: Payer: Medicare Other | Admitting: Physical Therapy

## 2021-07-09 DIAGNOSIS — G8929 Other chronic pain: Secondary | ICD-10-CM | POA: Diagnosis not present

## 2021-07-09 DIAGNOSIS — M25512 Pain in left shoulder: Secondary | ICD-10-CM

## 2021-07-09 DIAGNOSIS — M4125 Other idiopathic scoliosis, thoracolumbar region: Secondary | ICD-10-CM

## 2021-07-09 DIAGNOSIS — M5441 Lumbago with sciatica, right side: Secondary | ICD-10-CM | POA: Diagnosis not present

## 2021-07-09 DIAGNOSIS — M6281 Muscle weakness (generalized): Secondary | ICD-10-CM

## 2021-07-09 DIAGNOSIS — M5442 Lumbago with sciatica, left side: Secondary | ICD-10-CM

## 2021-07-09 DIAGNOSIS — R2689 Other abnormalities of gait and mobility: Secondary | ICD-10-CM | POA: Diagnosis not present

## 2021-07-09 NOTE — Therapy (Signed)
OUTPATIENT PHYSICAL THERAPY SHOULDER TREATMENT   Patient Name: Annette Gross MRN: [id number] DOB:08/08/1940, 81 y.o., female Today's Date: 07/09/2021   PT End of Session - 07/09/21 1312     Visit Number 9    Date for PT Re-Evaluation 09/05/21    Authorization Type UHC Medicare    Progress Note Due on Visit 10    PT Start Time 1015    PT Stop Time 1100    PT Time Calculation (min) 45 min    Activity Tolerance Patient tolerated treatment well    Behavior During Therapy WFL for tasks assessed/performed             Past Medical History:  Diagnosis Date   Anemia    many yrs ago   Anxiety    Asthma    no recent issues   Cancer ([place])    s/p thyroidectomy 8144   Complication of anesthesia    Cystitis, interstitial    Degenerative disc disease, lumbar    Degenerative joint disease    spinal stenosis "Chronic low back pain"   Depression    Diabetes mellitus    diet control.   Diverticulitis 2005   Diverticulosis 2005   Factor V deficiency ([place])    Fibromyalgia    Fracture of right foot 1995   GERD (gastroesophageal reflux disease)    History of hiatal hernia    Hyperlipidemia    Hypertension    off med x 5 yrs.   IBS (irritable bowel syndrome)    Insomnia    Iron deficiency    Liver hemangioma 1999   MRI   Mononucleosis    Morton's neuroma    Left foot   Peripheral neuropathy    Pneumonia    years 89 & 89.  None since   PONV (postoperative nausea and vomiting)    nausea no vomiting   Rectocele    Transfusion history    child x2, many yrs ago after childbirth"hemorrhage"   Umbilical hernia 8185   Past Surgical History:  Procedure Laterality Date   blood vessel tumor removal  1980   from chin   CHOLECYSTECTOMY     COLONOSCOPY WITH PROPOFOL N/A 09/02/2015   Procedure: COLONOSCOPY WITH PROPOFOL;  Surgeon: Garlan Fair, MD;  Location: [facility];  Service: Endoscopy;  Laterality: N/A;   GANGLION CYST EXCISION     right   HEMORRHOID SURGERY     HERNIA  REPAIR     repair was aimed at the [place]     and nissen fundoplication   LAPAROSCOPIC ESOPHAGOGASTRIC FUNDOPLASTY     LAPAROSCOPIC INCISIONAL / UMBILICAL / VENTRAL HERNIA REPAIR     umbilical hernia   OOPHORECTOMY     left   RIGHT OOPHORECTOMY     '05-laparaoscopic   SHOULDER SURGERY Right    THYROIDECTOMY N/A 07/06/2016   Procedure: TOTAL THYROIDECTOMY;  Surgeon: Jackolyn Confer, MD;  Location: [facility];  Service: General;  Laterality: N/A;   TONSILLECTOMY     TOTAL THYROIDECTOMY  07/06/2016   Patient Active Problem List   Diagnosis Date Noted   Moderate nonproliferative diabetic retinopathy of left eye ([place]) 10/10/2020   Moderate nonproliferative diabetic retinopathy of right eye ([place]) 08/29/2019   Early stage nonexudative age-related macular degeneration of both eyes 08/29/2019   Right posterior capsular opacification 08/29/2019   Nuclear sclerotic cataract of left eye 08/29/2019   Left thyroid nodule 07/06/2016   Benign neoplasm of skin of upper limb,  including shoulder 04/19/2012   Ventral hernia, recurrent 01/26/2011    PCP: Lavone Orn, MD  REFERRING PROVIDER: Lavone Orn, MD  REFERRING DIAG: [phone] (ICD-10-CM) - Left rotator cuff tear   THERAPY DIAG:  Muscle weakness (generalized)  Chronic left shoulder pain  Other idiopathic scoliosis, thoracolumbar region  Chronic low back pain with bilateral sciatica, unspecified back pain laterality   ONSET DATE: 04/30/2021   SUBJECTIVE:                                                                                                                                                                                      SUBJECTIVE STATEMENT:  I am doing much better with PT. No current pain.  PERTINENT HISTORY: DM, HTN, L rotator cuff tear, R rotator cuff repair in approx 2002   PAIN:  Are you having pain? No NPRS scale: 0/10 Pain location: back Pain orientation: Lower  PAIN TYPE: aching Pain  description: intermittent  Aggravating factors: activity Relieving factors: sitting/rest  PRECAUTIONS: Fall  WEIGHT BEARING RESTRICTIONS No  FALLS:  Has patient fallen in last 6 months? No Number of falls: 0  LIVING ENVIRONMENT: Lives with: lives alone Lives in: House/apartment Stairs: No;  Has following equipment at home: Environmental consultant - 4 wheeled and Grab bars  OCCUPATION: retired  PLOF: Independent  PATIENT GOALS:  To strengthen my arms  OBJECTIVE:   DIAGNOSTIC FINDINGS:  X-ray revealed bruised rotator cuff  PATIENT SURVEYS:  FOTO 54% (projected 59% by visit 11)  COGNITION:  Overall cognitive status: Within functional limits for tasks assessed     POSTURE: Forward flexed, rounded shoulders, scoliosis  UPPER EXTREMITY AROM/PROM: Measured in sitting on 07/04/2021  A/PROM Right 07/09/2021 Left 07/09/2021  Shoulder flexion 145 130  Shoulder extension    Shoulder abduction 145 125  Shoulder adduction    Shoulder internal rotation    Shoulder external rotation    Elbow flexion    Elbow extension    Wrist flexion    Wrist extension    Wrist ulnar deviation    Wrist radial deviation    Wrist pronation    Wrist supination    (Blank rows = not tested)  UPPER EXTREMITY MMT: L shoulder strength of 4-/5 grossly throughout  Todays Treatment: 07/09/21: Pt arrives for aquatic physical therapy. Treatment took place in 3.5-5.5 feet of water. Water temperature was 93 degrees F. Pt entered the pool via stairs step to step with moderate use of rails. Pt requires buoyancy of water for support and to offload joints with strengthening exercises.   Seated water bench with 75% submersion Pt performed seated LE AROM exercises 20x in all planes,  Water walking  in 75% depth: forward4x with no UE, then slo mo arms for 4 lengths. Backward walking 4x no UE, then added push/pull for 4 lengths Standing against wall: shoulder flex/ext 2x10 with mitten hands, single buoy wts horizontal add/abd  2x10, shoulder circles with same weights 10x, LT shoulder ER/IR 10x with mitten hands.  Push the water weights underwater and hold 5 sec 10x, VC to depress shoulders 3 lengths of full breast stroke swim   TODAY'S TREATMENT:  07/08/2021: Nu-Step L1 seat 7 5 min while discussing status/progress Seated:  bicep curls with 2#. 2x10 B each Standing pulleys with 4# counter weight flex and abduction 15-20x each Seated red band internal and external rotation 10x each; triceps extension red band 10x; red band forward press with left 10x  Seated AA/ROM with 2# on cane flexion x10 bilat Cane on table top presses 2x5       Counter top push ups 2x10 Thoracic extension with ball behind back 2x10 07/04/2021: UBE level 1.0 x3 min each direction with PT present to discuss status Seated:  scapular retraction, flexion and abduction and bicep curls with 2#. 2x10 B each Seated AA/ROM with 2# on cane flexion 2x10 bilat Seated AA/ROM with cane for abduction LUE 2x10 Wall push ups 2x10 Thoracic extension with ball behind back 2x10   PATIENT EDUCATION: Education details: Reviewed HEP Person educated: Patient Education method: Explanation Education comprehension: verbalized understanding   HOME EXERCISE PROGRAM: Access Code: [email or web address] URL: [email or web address] Date: 07/04/2021 Prepared by: Shelby Dubin Menke  Exercises Seated Scapular Retraction - 1-2 x daily - 7 x weekly - 2 sets - 10 reps Seated Shoulder Flexion Towel Slide at Table Top - 1-2 x daily - 7 x weekly - 2 sets - 10 reps Seated Shoulder Abduction Towel Slide at Table Top - 1-2 x daily - 7 x weekly - 2 sets - 10 reps Flexion-Extension Shoulder Pendulum with Table Support - 1 x daily - 7 x weekly - 2 sets - 10 reps Standing Single Arm Row with Resistance Thumb Up (Mirrored) - 1 x daily - 7 x weekly - 1 sets - 10 reps Single Arm Shoulder Extension with Anchored Resistance (Mirrored) - 1 x daily - 7 x weekly - 1 sets - 10 reps Shoulder  External Rotation with Anchored Resistance - 1 x daily - 7 x weekly - 1 sets - 10 reps Standing Shoulder Internal Rotation with Anchored Resistance - 1 x daily - 7 x weekly - 1 sets - 10 reps Seated Single Arm Elbow Flexion with Resistance - 1 x daily - 7 x weekly - 1 sets - 10 reps Seated Thoracic Lumbar Extension with Pectoralis Stretch - 1 x daily - 7 x weekly - 1 sets - 10 reps   ASSESSMENT:  CLINICAL IMPRESSION: Pt arrives for first aquatic PT session. Pt has prior water experience and very much enjoys the water. Mostly pt required cuing for shoulder depression with various activities and exercises. Pt had no shoulder pain with any exercise including 3 lengths of full breast stroke at end of session.     OBJECTIVE IMPAIRMENTS decreased activity tolerance, decreased balance, difficulty walking, decreased ROM, decreased strength, impaired UE functional use, postural dysfunction, and pain.   ACTIVITY LIMITATIONS cleaning, community activity, and driving.   PERSONAL FACTORS Age and 3+ comorbidities: DM, R rotator cuff repair in approx 2002, idopathic scoliosis  are also affecting patient's functional outcome.    REHAB POTENTIAL: Good  CLINICAL DECISION MAKING: Evolving/moderate complexity  EVALUATION COMPLEXITY: Moderate  GOALS: Goals reviewed with patient? Yes  SHORT TERM GOALS:  Independent with initial HEP. Baseline:  Target date: 07/23/2021 Goal status: MET   LONG TERM GOALS:  Pt will be independent with advanced HEP. Baseline:  Target date: 09/03/2021 Goal status: IN PROGRESS  2.  Pt will increase B shoulder strength to at least 4+/5 to allow her to pick up heavy objects in home. Baseline:  Target date: 09/03/2021 Goal status: IN PROGRESS  3.  Pt will increase L shoulder flexion and abduction to at least 130 degrees while holding a household item to allow her to reach into overhead cabinets. Baseline: 114 degrees flexion, 87 degrees abduction Target date:  09/03/2021 Goal status: IN PROGRESS  4.  Pt will increase FOTO to 59% to allow her to perform more functional activities in her home. Baseline: 52% Target date: 09/03/2021 Goal status: IN PROGRESS   PLAN: PT FREQUENCY: 2x/week  PT DURATION: 8 weeks  PLANNED INTERVENTIONS: Therapeutic exercises, Therapeutic activity, Neuromuscular re-education, Balance training, Gait training, Patient/Family education, Joint mobilization, Stair training, Aquatic Therapy, Dry Needling, Electrical stimulation, Spinal manipulation, Spinal mobilization, Cryotherapy, Moist heat, Taping, Vasopneumatic device, Traction, Ultrasound, Ionotophoresis 52m/ml Dexamethasone, and Manual therapy  PLAN FOR NEXT SESSION: check cane height/usage if she gets a cane; assess and progress HEP as indicated, strengthening, ROM, 10th visit PN next  SRuben Im PT 07/09/21 3:45 PM Phone: [phone]: [phone] Mikalah Skyles, PTA 07/09/2021, 3:45 PM

## 2021-07-10 ENCOUNTER — Encounter: Payer: Medicare Other | Admitting: Physical Therapy

## 2021-07-15 ENCOUNTER — Telehealth: Payer: Self-pay | Admitting: Physical Therapy

## 2021-07-15 ENCOUNTER — Ambulatory Visit: Payer: Medicare Other | Admitting: Physical Therapy

## 2021-07-15 NOTE — Telephone Encounter (Signed)
Called patient regarding missed appt.  She thought today's visit had been removed (erroneously scheduled 3x/week instead of 2x/week).  Reminded of next appt at the pool and next clinic appt next week.   ?

## 2021-07-17 ENCOUNTER — Encounter: Payer: Medicare Other | Admitting: Rehabilitative and Restorative Service Providers"

## 2021-07-18 ENCOUNTER — Ambulatory Visit: Payer: Medicare Other | Admitting: Physical Therapy

## 2021-07-18 ENCOUNTER — Other Ambulatory Visit: Payer: Self-pay

## 2021-07-18 DIAGNOSIS — M6281 Muscle weakness (generalized): Secondary | ICD-10-CM

## 2021-07-18 DIAGNOSIS — M25512 Pain in left shoulder: Secondary | ICD-10-CM | POA: Diagnosis not present

## 2021-07-18 DIAGNOSIS — M5442 Lumbago with sciatica, left side: Secondary | ICD-10-CM | POA: Diagnosis not present

## 2021-07-18 DIAGNOSIS — G8929 Other chronic pain: Secondary | ICD-10-CM

## 2021-07-18 DIAGNOSIS — R2689 Other abnormalities of gait and mobility: Secondary | ICD-10-CM | POA: Diagnosis not present

## 2021-07-18 DIAGNOSIS — M4125 Other idiopathic scoliosis, thoracolumbar region: Secondary | ICD-10-CM

## 2021-07-18 DIAGNOSIS — M5441 Lumbago with sciatica, right side: Secondary | ICD-10-CM | POA: Diagnosis not present

## 2021-07-18 NOTE — Therapy (Addendum)
?OUTPATIENT PHYSICAL THERAPY SHOULDER TREATMENT/10th visit progress note ? ? ?Patient Name: Annette Gross ?MRN: [id number] ?DOB:02-Nov-1940, 81 y.o., female ?Today's Date: 07/18/2021 ? ? PT End of Session - 07/18/21 1557   ? ? Visit Number 10   ? Date for PT Re-Evaluation 09/05/21   ? Authorization Type [employer] Medicare   ? Progress Note Due on Visit 10   ? PT Start Time 0737   ? PT Stop Time 1062   ? PT Time Calculation (min) 41 min   ? Activity Tolerance Patient tolerated treatment well   ? Behavior During Therapy [facility] for tasks assessed/performed   ? ?  ?  ? ?  ? ? ? ?Past Medical History:  ?Diagnosis Date  ? Anemia   ? many yrs ago  ? Anxiety   ? Asthma   ? no recent issues  ? Cancer [facility])   ? s/p thyroidectomy 2018  ? Complication of anesthesia   ? Cystitis, interstitial   ? Degenerative disc disease, lumbar   ? Degenerative joint disease   ? spinal stenosis "Chronic low back pain"  ? Depression   ? Diabetes mellitus   ? diet control.  ? Diverticulitis 2005  ? Diverticulosis 2005  ? Factor V deficiency ([place])   ? Fibromyalgia   ? Fracture of right foot 1995  ? GERD (gastroesophageal reflux disease)   ? History of hiatal hernia   ? Hyperlipidemia   ? Hypertension   ? off med x 5 yrs.  ? IBS (irritable bowel syndrome)   ? Insomnia   ? Iron deficiency   ? Liver hemangioma 1999  ? MRI  ? Mononucleosis   ? Morton's neuroma   ? Left foot  ? Peripheral neuropathy   ? Pneumonia   ? years 27 & 89.  None since  ? PONV (postoperative nausea and vomiting)   ? nausea no vomiting  ? Rectocele   ? Transfusion history   ? child x2, many yrs ago after childbirth"hemorrhage"  ? Umbilical hernia 6948  ? ?Past Surgical History:  ?Procedure Laterality Date  ? blood vessel tumor removal  1980  ? from chin  ? CHOLECYSTECTOMY    ? COLONOSCOPY WITH PROPOFOL N/A 09/02/2015  ? Procedure: COLONOSCOPY WITH PROPOFOL;  Surgeon: Garlan Fair, MD;  Location: [facility];  Service: Endoscopy;  Laterality: N/A;  ? GANGLION CYST EXCISION    ? right  ?  HEMORRHOID SURGERY    ? HERNIA REPAIR    ? repair was aimed at the [facility]  ? [place]    ? and nissen fundoplication  ? LAPAROSCOPIC ESOPHAGOGASTRIC FUNDOPLASTY    ? LAPAROSCOPIC INCISIONAL / UMBILICAL / VENTRAL HERNIA REPAIR    ? umbilical hernia  ? OOPHORECTOMY    ? left  ? RIGHT OOPHORECTOMY    ? '05-laparaoscopic  ? SHOULDER SURGERY Right   ? THYROIDECTOMY N/A 07/06/2016  ? Procedure: TOTAL THYROIDECTOMY;  Surgeon: Jackolyn Confer, MD;  Location: [place];  Service: General;  Laterality: N/A;  ? TONSILLECTOMY    ? TOTAL THYROIDECTOMY  07/06/2016  ? ?Patient Active Problem List  ? Diagnosis Date Noted  ? Moderate nonproliferative diabetic retinopathy of left eye ([place]) 10/10/2020  ? Moderate nonproliferative diabetic retinopathy of right eye ([place]) 08/29/2019  ? Early stage nonexudative age-related macular degeneration of both eyes 08/29/2019  ? Right posterior capsular opacification 08/29/2019  ? Nuclear sclerotic cataract of left eye 08/29/2019  ? Left thyroid nodule 07/06/2016  ? Benign neoplasm of  skin of upper limb, including shoulder 04/19/2012  ? Ventral hernia, recurrent 01/26/2011  ? ?Progress Note ?Reporting Period 05/21/2021 to 07/18/2021 ? ?See note below for Objective Data and Assessment of Progress/Goals.  ? ?  ?PCP: Lavone Orn, MD ? ?REFERRING PROVIDER: Melrose Nakayama, MD ? ?REFERRING DIAG: M75.102 (ICD-10-CM) - Left rotator cuff tear  ? ?THERAPY DIAG:  ?Muscle weakness (generalized) ? ?Chronic left shoulder pain ? ?Other idiopathic scoliosis, thoracolumbar region ? ?Chronic low back pain with bilateral sciatica, unspecified back pain laterality ? ?Other abnormalities of gait and mobility ? ? ?ONSET DATE: 04/30/2021  ? ?SUBJECTIVE:                                                                                                                                                                                     ? ?SUBJECTIVE STATEMENT:  I am doing much better with PT. No current pain. I feel 75%  better than on eval day, ? ?PERTINENT HISTORY: ?DM, HTN, L rotator cuff tear, R rotator cuff repair in approx 2002  ? ?PAIN:  ?Are you having pain? No ?NPRS scale: 0/10 ?Pain location: ?Pain orientation:  ?PAIN TYPE:  ?Pain description: intermittent  ?Aggravating factors: activity ?Relieving factors: sitting/rest ? ?PRECAUTIONS: Fall ? ?WEIGHT BEARING RESTRICTIONS No ? ?FALLS:  ?Has patient fallen in last 6 months? No Number of falls: 0 ? ?LIVING ENVIRONMENT: ?Lives with: lives alone ?Lives in: House/apartment ?Stairs: No;  ?Has following equipment at home: Gilford Rile - 4 wheeled and Grab bars ? ?OCCUPATION: ?retired ? ?PLOF: Independent ? ?PATIENT GOALS:  To strengthen my arms ? ?OBJECTIVE:  ? ?DIAGNOSTIC FINDINGS:  ?X-ray revealed bruised rotator cuff ? ?PATIENT SURVEYS:  ?FOTO 54% (projected 59% by visit 11) ? ?COGNITION: ? Overall cognitive status: Within functional limits for tasks assessed ?    ?POSTURE: ?Forward flexed, rounded shoulders, scoliosis ? ?UPPER EXTREMITY AROM/PROM: ?Measured in sitting on 07/04/2021 ? ?A/PROM Right ?07/18/2021 Left ?07/18/2021  ?Shoulder flexion 145 130  ?Shoulder extension    ?Shoulder abduction 145 125  ?Shoulder adduction    ?Shoulder internal rotation    ?Shoulder external rotation    ?Elbow flexion    ?Elbow extension    ?Wrist flexion    ?Wrist extension    ?Wrist ulnar deviation    ?Wrist radial deviation    ?Wrist pronation    ?Wrist supination    ?(Blank rows = not tested) ? ?UPPER EXTREMITY MMT: ?L shoulder strength of 4-/5 grossly throughout ? ?Treatment; 07/18/21 ?Pt arrives for aquatic physical therapy. Treatment took place in 3.5-5.5 feet of water. Water temperature was. 92 degrees F. Pt entered the pool via stairs with moderate use of rails, extra  caution. Pt requires buoyancy of water for support and to offload joints with strengthening exercises.   ? Seated water bench with 75% submersion ?Pt performed seated LE AROM exercises 20x in all planes, concurrent review of  status. 2# LAQ 2x10 ?Standing in 50%- 75% depth water pt performed water walking in all 4 directions 10x. VC for speed in order to generate appropriate current for resistance and/or UE movements. VC for UE movements that were easy to do with each direction.  ?Standing against the wall with hand paddles: shlder flex/ext 2x10, horizontal abd/add 2x10 ?Push buoyant object underwater and walk across pool 4x ?2 lengths of the breast stroke.  ?Hand paddles shoulder ER/IR 2x10 with PTA providing tactile cuing to keep elbow from extending. ?Water weights: open/close 2x10, the breast stroke arms 10x ? ?Todays Treatment: 07/09/21: Pt arrives for aquatic physical therapy. Treatment took place in 3.5-5.5 feet of water. Water temperature was 93 degrees F. Pt entered the pool via stairs step to step with moderate use of rails. Pt requires buoyancy of water for support and to offload joints with strengthening exercises.   ?Seated water bench with 75% submersion ?Pt performed seated LE AROM exercises 20x in all planes,  ?Water walking in 75% depth: forward4x with no UE, then slo mo arms for 4 lengths. Backward walking 4x no UE, then added push/pull for 4 lengths ?Standing against wall: shoulder flex/ext 2x10 with mitten hands, single buoy wts horizontal add/abd 2x10, shoulder circles with same weights 10x, LT shoulder ER/IR 10x with mitten hands.  ?Push the water weights underwater and hold 5 sec 10x, VC to depress shoulders ?3 lengths of full breast stroke swim ? ? ?TODAY'S TREATMENT:  ?07/08/2021: ?Nu-Step L1 seat 7 5 min while discussing status/progress ?Seated:  bicep curls with 2#. 2x10 B each ?Standing pulleys with 4# counter weight flex and abduction 15-20x each ?Seated red band internal and external rotation 10x each; triceps extension red band 10x; red band forward press with left 10x  ?Seated AA/ROM with 2# on cane flexion x10 bilat ?Cane on table top presses 2x5  ?     Counter top push ups 2x10 ?Thoracic extension with ball  behind back 2x10 ?07/04/2021: ?UBE level 1.0 x3 min each direction with PT present to discuss status ?Seated:  scapular retraction, flexion and abduction and bicep curls with 2#. 2x10 B each ?Seated AA/ROM with

## 2021-07-22 ENCOUNTER — Other Ambulatory Visit: Payer: Self-pay

## 2021-07-22 ENCOUNTER — Ambulatory Visit: Payer: Medicare Other | Admitting: Physical Therapy

## 2021-07-22 DIAGNOSIS — M4125 Other idiopathic scoliosis, thoracolumbar region: Secondary | ICD-10-CM

## 2021-07-22 DIAGNOSIS — R2689 Other abnormalities of gait and mobility: Secondary | ICD-10-CM | POA: Diagnosis not present

## 2021-07-22 DIAGNOSIS — M5442 Lumbago with sciatica, left side: Secondary | ICD-10-CM | POA: Diagnosis not present

## 2021-07-22 DIAGNOSIS — M6281 Muscle weakness (generalized): Secondary | ICD-10-CM | POA: Diagnosis not present

## 2021-07-22 DIAGNOSIS — M25512 Pain in left shoulder: Secondary | ICD-10-CM | POA: Diagnosis not present

## 2021-07-22 DIAGNOSIS — G8929 Other chronic pain: Secondary | ICD-10-CM

## 2021-07-22 DIAGNOSIS — M5441 Lumbago with sciatica, right side: Secondary | ICD-10-CM | POA: Diagnosis not present

## 2021-07-22 NOTE — Therapy (Signed)
?OUTPATIENT PHYSICAL THERAPY SHOULDER TREATMENT ? ? ?Patient Name: Annette Gross ?MRN: [id number] ?DOB:15-Aug-1940, 81 y.o., female ?Today's Date: 07/22/2021 ? ? PT End of Session - 07/22/21 1148   ? ? Visit Number 11   ? Date for PT Re-Evaluation 09/05/21   ? Authorization Type UHC Medicare   ? PT Start Time 1148   ? PT Stop Time 1226   ? PT Time Calculation (min) 38 min   ? Activity Tolerance Patient tolerated treatment well   ? ?  ?  ? ?  ? ? ? ?Past Medical History:  ?Diagnosis Date  ? Anemia   ? many yrs ago  ? Anxiety   ? Asthma   ? no recent issues  ? Cancer [facility])   ? s/p thyroidectomy 2018  ? Complication of anesthesia   ? Cystitis, interstitial   ? Degenerative disc disease, lumbar   ? Degenerative joint disease   ? spinal stenosis "Chronic low back pain"  ? Depression   ? Diabetes mellitus   ? diet control.  ? Diverticulitis 2005  ? Diverticulosis 2005  ? Factor V deficiency ([place])   ? Fibromyalgia   ? Fracture of right foot 1995  ? GERD (gastroesophageal reflux disease)   ? History of hiatal hernia   ? Hyperlipidemia   ? Hypertension   ? off med x 5 yrs.  ? IBS (irritable bowel syndrome)   ? Insomnia   ? Iron deficiency   ? Liver hemangioma 1999  ? MRI  ? Mononucleosis   ? Morton's neuroma   ? Left foot  ? Peripheral neuropathy   ? Pneumonia   ? years 7 & 59.  None since  ? PONV (postoperative nausea and vomiting)   ? nausea no vomiting  ? Rectocele   ? Transfusion history   ? child x2, many yrs ago after childbirth"hemorrhage"  ? Umbilical hernia 3300  ? ?Past Surgical History:  ?Procedure Laterality Date  ? blood vessel tumor removal  1980  ? from chin  ? CHOLECYSTECTOMY    ? COLONOSCOPY WITH PROPOFOL N/A 09/02/2015  ? Procedure: COLONOSCOPY WITH PROPOFOL;  Surgeon: Garlan Fair, MD;  [facility]: [facility];  Service: Endoscopy;  Laterality: N/A;  ? GANGLION CYST EXCISION    ? right  ? HEMORRHOID SURGERY    ? HERNIA REPAIR    ? repair was aimed at the [facility]  ? [place]    ? and nissen  fundoplication  ? LAPAROSCOPIC ESOPHAGOGASTRIC FUNDOPLASTY    ? LAPAROSCOPIC INCISIONAL / UMBILICAL / VENTRAL HERNIA REPAIR    ? umbilical hernia  ? OOPHORECTOMY    ? left  ? RIGHT OOPHORECTOMY    ? '05-laparaoscopic  ? SHOULDER SURGERY Right   ? THYROIDECTOMY N/A 07/06/2016  ? Procedure: TOTAL THYROIDECTOMY;  Surgeon: Jackolyn Confer, MD;  Location: [place];  Service: General;  Laterality: N/A;  ? TONSILLECTOMY    ? TOTAL THYROIDECTOMY  07/06/2016  ? ?Patient Active Problem List  ? Diagnosis Date Noted  ? Moderate nonproliferative diabetic retinopathy of left eye ([place]) 10/10/2020  ? Moderate nonproliferative diabetic retinopathy of right eye ([place]) 08/29/2019  ? Early stage nonexudative age-related macular degeneration of both eyes 08/29/2019  ? Right posterior capsular opacification 08/29/2019  ? Nuclear sclerotic cataract of left eye 08/29/2019  ? Left thyroid nodule 07/06/2016  ? Benign neoplasm of skin of upper limb, including shoulder 04/19/2012  ? Ventral hernia, recurrent 01/26/2011  ? ?Progress Note ?Reporting Period 05/21/2021 to 07/18/2021 ? ?  See note below for Objective Data and Assessment of Progress/Goals.  ? ?  ?PCP: Lavone Orn, MD ? ?REFERRING PROVIDER: Lavone Orn, MD ? ?REFERRING DIAG: M75.102 (ICD-10-CM) - Left rotator cuff tear  ? ?THERAPY DIAG:  ?Muscle weakness (generalized) ? ?Chronic left shoulder pain ? ?Other idiopathic scoliosis, thoracolumbar region ? ? ?ONSET DATE: 04/30/2021  ? ?SUBJECTIVE:                                                                                                                                                                                     ? ?SUBJECTIVE STATEMENT:  No pain right now.  Went to the pool on Saturday and did well.   I feel 75% better.   ? ?PERTINENT HISTORY: ?DM, HTN, L rotator cuff tear, R rotator cuff repair in approx 2002  ? ?PAIN:  ?Are you having pain? No ?NPRS scale: 0/10 ?Pain location: ?Pain orientation:  ?PAIN TYPE:  ?Pain description:  intermittent  ?Aggravating factors: activity ?Relieving factors: sitting/rest ? ?PRECAUTIONS: Fall ? ?WEIGHT BEARING RESTRICTIONS No ? ?FALLS:  ?Has patient fallen in last 6 months? No Number of falls: 0 ? ?LIVING ENVIRONMENT: ?Lives with: lives alone ?Lives in: House/apartment ?Stairs: No;  ?Has following equipment at home: Gilford Rile - 4 wheeled and Grab bars ? ?OCCUPATION: ?retired ? ?PLOF: Independent ? ?PATIENT GOALS:  To strengthen my arms ? ?OBJECTIVE:  ? ?DIAGNOSTIC FINDINGS:  ?X-ray revealed bruised rotator cuff ? ?PATIENT SURVEYS:  ?54%  ? ?COGNITION: ? Overall cognitive status: Within functional limits for tasks assessed ?    ?POSTURE: ?Forward flexed, rounded shoulders, scoliosis ? ?UPPER EXTREMITY AROM/PROM: ?Measured in sitting on 07/04/2021 ? ?A/PROM Right ?07/22/2021 Left ?07/22/2021  ?Shoulder flexion 145 130  ?Shoulder extension    ?Shoulder abduction 145 125  ?Shoulder adduction    ?Shoulder internal rotation    ?Shoulder external rotation    ?Elbow flexion    ?Elbow extension    ?Wrist flexion    ?Wrist extension    ?Wrist ulnar deviation    ?Wrist radial deviation    ?Wrist pronation    ?Wrist supination    ?(Blank rows = not tested) ? ?UPPER EXTREMITY MMT: ?L shoulder strength of 4+/5 grossly throughout ?TODAY'S TREATMENT:  ?07/22/2021: ?Nu-Step L1 seat 7 5 min while discussing status/progress ?Seated:  bicep curls with red band. 2x10 B each ?Seated red band internal and external rotation 10x each; triceps extension red band 10x; red band forward press with left 10x  ?     Counter top push ups 2x10 ?Thoracic extension with ball behind back 2x10 ?Comprehensive review of HEP ?Treatment; 07/18/21 ?Pt arrives for aquatic physical therapy. Treatment took place in  3.5-5.5 feet of water. Water temperature was. 92 degrees F. Pt entered the pool via stairs with moderate use of rails, extra caution. Pt requires buoyancy of water for support and to offload joints with strengthening exercises.   ? Seated water bench  with 75% submersion ?Pt performed seated LE AROM exercises 20x in all planes, concurrent review of status. 2# LAQ 2x10 ?Standing in 50%- 75% depth water pt performed water walking in all 4 directions 10x. VC for speed in order to generate appropriate current for resistance and/or UE movements. VC for UE movements that were easy to do with each direction.  ?Standing against the wall with hand paddles: shlder flex/ext 2x10, horizontal abd/add 2x10 ?Push buoyant object underwater and walk across pool 4x ?2 lengths of the breast stroke.  ?Hand paddles shoulder ER/IR 2x10 with PTA providing tactile cuing to keep elbow from extending. ?Water weights: open/close 2x10, the breast stroke arms 10x ? ?Todays Treatment: 07/09/21: Pt arrives for aquatic physical therapy. Treatment took place in 3.5-5.5 feet of water. Water temperature was 93 degrees F. Pt entered the pool via stairs step to step with moderate use of rails. Pt requires buoyancy of water for support and to offload joints with strengthening exercises.   ?Seated water bench with 75% submersion ?Pt performed seated LE AROM exercises 20x in all planes,  ?Water walking in 75% depth: forward4x with no UE, then slo mo arms for 4 lengths. Backward walking 4x no UE, then added push/pull for 4 lengths ?Standing against wall: shoulder flex/ext 2x10 with mitten hands, single buoy wts horizontal add/abd 2x10, shoulder circles with same weights 10x, LT shoulder ER/IR 10x with mitten hands.  ?Push the water weights underwater and hold 5 sec 10x, VC to depress shoulders ?3 lengths of full breast stroke swim ? ? ?TODAY'S TREATMENT:  ?07/08/2021: ?Nu-Step L1 seat 7 5 min while discussing status/progress ?Seated:  bicep curls with 2#. 2x10 B each ?Standing pulleys with 4# counter weight flex and abduction 15-20x each ?Seated red band internal and external rotation 10x each; triceps extension red band 10x; red band forward press with left 10x  ?Seated AA/ROM with 2# on cane flexion x10  bilat ?Cane on table top presses 2x5  ?     Counter top push ups 2x10 ?Thoracic extension with ball behind back 2x10 ?07/04/2021: ?UBE level 1.0 x3 min each direction with PT present to discuss status ?Seated:  scapu

## 2021-07-22 NOTE — Therapy (Addendum)
?OUTPATIENT PHYSICAL THERAPY SHOULDER TREATMENT/Discharge summary ? ? ?Patient Name: Annette Gross ?MRN: [id number] ?DOB:February 05, 1941, 81 y.o., female ?Today's Date: 07/23/2021 ? ? PT End of Session - 07/23/21 0904   ? ? Visit Number 12   ? Date for PT Re-Evaluation 09/05/21   ? Authorization Type U[employer] Medicare   ? Progress Note Due on Visit 10   ? PT Start Time 0901   Pt late  ? PT Stop Time 0931   ? PT Time Calculation (min) 30 min   ? Activity Tolerance Patient tolerated treatment well   ? Behavior During Therapy [facility] for tasks assessed/performed   ? ?  ?  ? ?  ? ? ? ? ?Past Medical History:  ?Diagnosis Date  ? Anemia   ? many yrs ago  ? Anxiety   ? Asthma   ? no recent issues  ? Cancer [facility])   ? s/p thyroidectomy 2018  ? Complication of anesthesia   ? Cystitis, interstitial   ? Degenerative disc disease, lumbar   ? Degenerative joint disease   ? spinal stenosis "Chronic low back pain"  ? Depression   ? Diabetes mellitus   ? diet control.  ? Diverticulitis 2005  ? Diverticulosis 2005  ? Factor V deficiency ([place])   ? Fibromyalgia   ? Fracture of right foot 1995  ? GERD (gastroesophageal reflux disease)   ? History of hiatal hernia   ? Hyperlipidemia   ? Hypertension   ? off med x 5 yrs.  ? IBS (irritable bowel syndrome)   ? Insomnia   ? Iron deficiency   ? Liver hemangioma 1999  ? MRI  ? Mononucleosis   ? Morton's neuroma   ? Left foot  ? Peripheral neuropathy   ? Pneumonia   ? years 37 & 69.  None since  ? PONV (postoperative nausea and vomiting)   ? nausea no vomiting  ? Rectocele   ? Transfusion history   ? child x2, many yrs ago after childbirth"hemorrhage"  ? Umbilical hernia 1219  ? ?Past Surgical History:  ?Procedure Laterality Date  ? blood vessel tumor removal  1980  ? from chin  ? CHOLECYSTECTOMY    ? COLONOSCOPY WITH PROPOFOL N/A 09/02/2015  ? Procedure: COLONOSCOPY WITH PROPOFOL;  Surgeon: Garlan Fair, MD;  Location: [facility];  Service: Endoscopy;  Laterality: N/A;  ? GANGLION CYST EXCISION    ? right   ? HEMORRHOID SURGERY    ? HERNIA REPAIR    ? repair was aimed at the [facility]  ? [place]    ? and nissen fundoplication  ? LAPAROSCOPIC ESOPHAGOGASTRIC FUNDOPLASTY    ? LAPAROSCOPIC INCISIONAL / UMBILICAL / VENTRAL HERNIA REPAIR    ? umbilical hernia  ? OOPHORECTOMY    ? left  ? RIGHT OOPHORECTOMY    ? '05-laparaoscopic  ? SHOULDER SURGERY Right   ? THYROIDECTOMY N/A 07/06/2016  ? Procedure: TOTAL THYROIDECTOMY;  Surgeon: Jackolyn Confer, MD;  Location: [place];  Service: General;  Laterality: N/A;  ? TONSILLECTOMY    ? TOTAL THYROIDECTOMY  07/06/2016  ? ?Patient Active Problem List  ? Diagnosis Date Noted  ? Moderate nonproliferative diabetic retinopathy of left eye ([place]) 10/10/2020  ? Moderate nonproliferative diabetic retinopathy of right eye ([place]) 08/29/2019  ? Early stage nonexudative age-related macular degeneration of both eyes 08/29/2019  ? Right posterior capsular opacification 08/29/2019  ? Nuclear sclerotic cataract of left eye 08/29/2019  ? Left thyroid nodule 07/06/2016  ? Benign  neoplasm of skin of upper limb, including shoulder 04/19/2012  ? Ventral hernia, recurrent 01/26/2011  ? ?Progress Note ?Reporting Period 05/21/2021 to 07/18/2021 ? ?See note below for Objective Data and Assessment of Progress/Goals.  ? ?  ?PCP: Lavone Orn, MD ? ?REFERRING PROVIDER: Melrose Nakayama, MD ? ?REFERRING DIAG: M75.102 (ICD-10-CM) - Left rotator cuff tear  ? ?THERAPY DIAG:  ?Muscle weakness (generalized) ? ?Chronic left shoulder pain ? ? ?ONSET DATE: 04/30/2021  ? ?SUBJECTIVE:                                                                                                                                                                                     ? ?SUBJECTIVE STATEMENT:  Today is my last day. I am sorry I am late, I took a wrong turn. My low back is sore today.     ? ?PERTINENT HISTORY: ?DM, HTN, L rotator cuff tear, R rotator cuff repair in approx 2002  ? ?PAIN:  ?Are you having pain? YES ?NPRS scale:  5/10 ?Pain location: Low back ?Pain orientation: sore ?PAIN TYPE:  ?Pain description: intermittent  ?Aggravating factors: I think from my swimming on Sunday ?Relieving factors: being in the water ? ?PRECAUTIONS: Fall ? ?WEIGHT BEARING RESTRICTIONS No ? ?FALLS:  ?Has patient fallen in last 6 months? No Number of falls: 0 ? ?LIVING ENVIRONMENT: ?Lives with: lives alone ?Lives in: House/apartment ?Stairs: No;  ?Has following equipment at home: Gilford Rile - 4 wheeled and Grab bars ? ?OCCUPATION: ?retired ? ?PLOF: Independent ? ?PATIENT GOALS:  To strengthen my arms ? ?OBJECTIVE:  ? ?DIAGNOSTIC FINDINGS:  ?X-ray revealed bruised rotator cuff ? ?PATIENT SURVEYS:  ?54%  ? ?COGNITION: ? Overall cognitive status: Within functional limits for tasks assessed ?    ?POSTURE: ?Forward flexed, rounded shoulders, scoliosis ? ?UPPER EXTREMITY AROM/PROM: ?Measured in sitting on 07/04/2021 ? ?A/PROM Right ?07/23/2021 Left ?07/23/2021  ?Shoulder flexion 145 130  ?Shoulder extension    ?Shoulder abduction 145 125  ?Shoulder adduction    ?Shoulder internal rotation    ?Shoulder external rotation    ?Elbow flexion    ?Elbow extension    ?Wrist flexion    ?Wrist extension    ?Wrist ulnar deviation    ?Wrist radial deviation    ?Wrist pronation    ?Wrist supination    ?(Blank rows = not tested) ? ?UPPER EXTREMITY MMT: ?L shoulder strength of 4+/5 grossly throughout ?PHYSICAL THERAPY DISCHARGE SUMMARY ? ?Visits from Start of Care: 12 ? ?Current functional level related to goals / functional outcomes: ?See clinical impressions below ?  ?Remaining deficits: ?As below ?  ?Education / Equipment: ?HEP  ? ?Patient agrees to discharge. Patient  goals were met. Patient is being discharged due to meeting the stated rehab goals.  ? ?TREATMENT: ?07/23/21: ?Pt arrives for aquatic physical therapy. Treatment took place in 3.5-5.5 feet of water. Water temperature was 92 degrees F. Pt entered the pool via stairs step to step with moderate use of rails.Pt requires  buoyancy of water for support and to offload joints with strengthening exercises.   ?Seated water bench with 75% submersion ?Pt performed seated LE AROM exercises 20x in all planes, concurrent review of status and what she did in the pool over the weekend. ?75% depth with mini squat to get shoulders in water: hand paddles shoulder flex/ext 2x10, add/abd 2x10, chest press /pull 2x10 with blue noodle ?75% depth for water walking with slight press down on blue noodle for scap stab and lumber support: 4 lengths ? ?07/22/2021: ?Nu-Step L1 seat 7 5 min while discussing status/progress ?Seated:  bicep curls with red band. 2x10 B each ?Seated red band internal and external rotation 10x each; triceps extension red band 10x; red band forward press with left 10x  ?     Counter top push ups 2x10 ?Thoracic extension with ball behind back 2x10 ?Comprehensive review of HEP ?Treatment; 07/18/21 ?Pt arrives for aquatic physical therapy. Treatment took place in 3.5-5.5 feet of water. Water temperature was. 92 degrees F. Pt entered the pool via stairs with moderate use of rails, extra caution. Pt requires buoyancy of water for support and to offload joints with strengthening exercises.   ? Seated water bench with 75% submersion ?Pt performed seated LE AROM exercises 20x in all planes, concurrent review of status. 2# LAQ 2x10 ?Standing in 50%- 75% depth water pt performed water walking in all 4 directions 10x. VC for speed in order to generate appropriate current for resistance and/or UE movements. VC for UE movements that were easy to do with each direction.  ?Standing against the wall with hand paddles: shlder flex/ext 2x10, horizontal abd/add 2x10 ?Push buoyant object underwater and walk across pool 4x ?2 lengths of the breast stroke.  ?Hand paddles shoulder ER/IR 2x10 with PTA providing tactile cuing to keep elbow from extending. ?Water weights: open/close 2x10, the breast stroke arms 10x ? ?Todays Treatment: 07/09/21: Pt arrives for  aquatic physical therapy. Treatment took place in 3.5-5.5 feet of water. Water temperature was 93 degrees F. Pt entered the pool via stairs step to step with moderate use of rails. Pt requires buoyancy of

## 2021-07-23 ENCOUNTER — Ambulatory Visit: Payer: Medicare Other | Admitting: Physical Therapy

## 2021-07-23 ENCOUNTER — Encounter: Payer: Self-pay | Admitting: Physical Therapy

## 2021-07-23 DIAGNOSIS — G8929 Other chronic pain: Secondary | ICD-10-CM | POA: Diagnosis not present

## 2021-07-23 DIAGNOSIS — M5442 Lumbago with sciatica, left side: Secondary | ICD-10-CM | POA: Diagnosis not present

## 2021-07-23 DIAGNOSIS — R2689 Other abnormalities of gait and mobility: Secondary | ICD-10-CM | POA: Diagnosis not present

## 2021-07-23 DIAGNOSIS — M6281 Muscle weakness (generalized): Secondary | ICD-10-CM

## 2021-07-23 DIAGNOSIS — M5441 Lumbago with sciatica, right side: Secondary | ICD-10-CM | POA: Diagnosis not present

## 2021-07-23 DIAGNOSIS — M25512 Pain in left shoulder: Secondary | ICD-10-CM | POA: Diagnosis not present

## 2021-07-23 DIAGNOSIS — M4125 Other idiopathic scoliosis, thoracolumbar region: Secondary | ICD-10-CM | POA: Diagnosis not present

## 2021-07-24 ENCOUNTER — Encounter: Payer: Medicare Other | Admitting: Physical Therapy

## 2021-07-24 DIAGNOSIS — G629 Polyneuropathy, unspecified: Secondary | ICD-10-CM | POA: Diagnosis not present

## 2021-07-24 DIAGNOSIS — R002 Palpitations: Secondary | ICD-10-CM | POA: Diagnosis not present

## 2021-07-24 DIAGNOSIS — R030 Elevated blood-pressure reading, without diagnosis of hypertension: Secondary | ICD-10-CM | POA: Diagnosis not present

## 2021-07-24 DIAGNOSIS — E782 Mixed hyperlipidemia: Secondary | ICD-10-CM | POA: Diagnosis not present

## 2021-07-24 DIAGNOSIS — Z0000 Encounter for general adult medical examination without abnormal findings: Secondary | ICD-10-CM | POA: Diagnosis not present

## 2021-07-24 DIAGNOSIS — E113393 Type 2 diabetes mellitus with moderate nonproliferative diabetic retinopathy without macular edema, bilateral: Secondary | ICD-10-CM | POA: Diagnosis not present

## 2021-07-24 DIAGNOSIS — R269 Unspecified abnormalities of gait and mobility: Secondary | ICD-10-CM | POA: Diagnosis not present

## 2021-07-25 DIAGNOSIS — E113393 Type 2 diabetes mellitus with moderate nonproliferative diabetic retinopathy without macular edema, bilateral: Secondary | ICD-10-CM | POA: Diagnosis not present

## 2021-07-29 DIAGNOSIS — R002 Palpitations: Secondary | ICD-10-CM | POA: Diagnosis not present

## 2021-07-31 ENCOUNTER — Ambulatory Visit
Admission: RE | Admit: 2021-07-31 | Discharge: 2021-07-31 | Disposition: A | Discharge status: Home / Self Care | Payer: Medicare Other | Source: Ambulatory Visit | Attending: Physician Assistant | Admitting: Physician Assistant

## 2021-07-31 ENCOUNTER — Other Ambulatory Visit: Payer: Self-pay | Admitting: Physician Assistant

## 2021-07-31 DIAGNOSIS — K1120 Sialoadenitis, unspecified: Secondary | ICD-10-CM

## 2021-07-31 DIAGNOSIS — Z85850 Personal history of malignant neoplasm of thyroid: Secondary | ICD-10-CM | POA: Diagnosis not present

## 2021-08-01 ENCOUNTER — Other Ambulatory Visit: Payer: Self-pay | Admitting: Physician Assistant

## 2021-08-01 ENCOUNTER — Ambulatory Visit
Admission: RE | Admit: 2021-08-01 | Discharge: 2021-08-01 | Disposition: A | Discharge status: Home / Self Care | Payer: Medicare Other | Source: Ambulatory Visit | Attending: Physician Assistant | Admitting: Physician Assistant

## 2021-08-01 DIAGNOSIS — Z85850 Personal history of malignant neoplasm of thyroid: Secondary | ICD-10-CM | POA: Diagnosis not present

## 2021-08-01 DIAGNOSIS — K1120 Sialoadenitis, unspecified: Secondary | ICD-10-CM

## 2021-08-01 DIAGNOSIS — R590 Localized enlarged lymph nodes: Secondary | ICD-10-CM

## 2021-08-01 DIAGNOSIS — M47812 Spondylosis without myelopathy or radiculopathy, cervical region: Secondary | ICD-10-CM | POA: Diagnosis not present

## 2021-08-01 DIAGNOSIS — E890 Postprocedural hypothyroidism: Secondary | ICD-10-CM | POA: Diagnosis not present

## 2021-08-01 DIAGNOSIS — K110 Atrophy of salivary gland: Secondary | ICD-10-CM | POA: Diagnosis not present

## 2021-08-05 DIAGNOSIS — K1120 Sialoadenitis, unspecified: Secondary | ICD-10-CM | POA: Diagnosis not present

## 2021-08-28 DIAGNOSIS — R002 Palpitations: Secondary | ICD-10-CM | POA: Diagnosis not present

## 2021-08-29 DIAGNOSIS — E890 Postprocedural hypothyroidism: Secondary | ICD-10-CM | POA: Diagnosis not present

## 2021-08-29 DIAGNOSIS — I10 Essential (primary) hypertension: Secondary | ICD-10-CM | POA: Diagnosis not present

## 2021-08-29 DIAGNOSIS — E1169 Type 2 diabetes mellitus with other specified complication: Secondary | ICD-10-CM | POA: Diagnosis not present

## 2021-09-01 DIAGNOSIS — L578 Other skin changes due to chronic exposure to nonionizing radiation: Secondary | ICD-10-CM | POA: Diagnosis not present

## 2021-09-01 DIAGNOSIS — L814 Other melanin hyperpigmentation: Secondary | ICD-10-CM | POA: Diagnosis not present

## 2021-09-01 DIAGNOSIS — L821 Other seborrheic keratosis: Secondary | ICD-10-CM | POA: Diagnosis not present

## 2021-09-01 DIAGNOSIS — D225 Melanocytic nevi of trunk: Secondary | ICD-10-CM | POA: Diagnosis not present

## 2021-09-01 DIAGNOSIS — L738 Other specified follicular disorders: Secondary | ICD-10-CM | POA: Diagnosis not present

## 2021-09-03 ENCOUNTER — Encounter (HOSPITAL_BASED_OUTPATIENT_CLINIC_OR_DEPARTMENT_OTHER): Payer: Self-pay | Admitting: Physical Therapy

## 2021-09-03 ENCOUNTER — Ambulatory Visit (HOSPITAL_BASED_OUTPATIENT_CLINIC_OR_DEPARTMENT_OTHER): Payer: Medicare Other | Attending: Internal Medicine | Admitting: Physical Therapy

## 2021-09-03 DIAGNOSIS — R269 Unspecified abnormalities of gait and mobility: Secondary | ICD-10-CM | POA: Insufficient documentation

## 2021-09-03 DIAGNOSIS — M6281 Muscle weakness (generalized): Secondary | ICD-10-CM | POA: Insufficient documentation

## 2021-09-03 DIAGNOSIS — M5459 Other low back pain: Secondary | ICD-10-CM | POA: Diagnosis not present

## 2021-09-03 NOTE — Therapy (Addendum)
OUTPATIENT PHYSICAL THERAPY LOWER EXTREMITY EVALUATION  PHYSICAL THERAPY DISCHARGE SUMMARY  Visits from Start of Care: 1  Current functional level related to goals / functional outcomes: No changes   Remaining deficits: all   Education / Equipment: Discussed eval findings, rehab rationale     Patient agrees to discharge. Patient goals were not met. Patient is being discharged due to not returning since the last visit.  Patient Name: Annette Gross MRN: [id number] DOB:[birth year], 81 y.o., female Today's Date: 09/03/2021   PT End of Session - 09/03/21 1842     Visit Number 1    Number of Visits 16    Date for PT Re-Evaluation 10/29/21    Authorization Type UHC Medicare    Progress Note Due on Visit 10    PT Start Time 1505    PT Stop Time 1545    PT Time Calculation (min) 40 min             Past Medical History:  Diagnosis Date   Anemia    many yrs ago   Anxiety    Asthma    no recent issues   Cancer ([place])    s/p thyroidectomy 8101   Complication of anesthesia    Cystitis, interstitial    Degenerative disc disease, lumbar    Degenerative joint disease    spinal stenosis "Chronic low back pain"   Depression    Diabetes mellitus    diet control.   Diverticulitis 2005   Diverticulosis 2005   Factor V deficiency ([place])    Fibromyalgia    Fracture of right foot 1995   GERD (gastroesophageal reflux disease)    History of hiatal hernia    Hyperlipidemia    Hypertension    off med x 5 yrs.   IBS (irritable bowel syndrome)    Insomnia    Iron deficiency    Liver hemangioma 1999   MRI   Mononucleosis    Morton's neuroma    Left foot   Peripheral neuropathy    Pneumonia    years 89 & 89.  None since   PONV (postoperative nausea and vomiting)    nausea no vomiting   Rectocele    Transfusion history    child x2, many yrs ago after childbirth"hemorrhage"   Umbilical hernia 7510   Past Surgical History:  Procedure Laterality Date   blood vessel tumor  removal  1980   from chin   CHOLECYSTECTOMY     COLONOSCOPY WITH PROPOFOL N/A 09/02/2015   Procedure: COLONOSCOPY WITH PROPOFOL;  Surgeon: Garlan Fair, MD;  [facility];  Service: Endoscopy;  Laterality: N/A;   GANGLION CYST EXCISION     right   HEMORRHOID SURGERY     HERNIA REPAIR     repair was aimed at the [place]     and nissen fundoplication   LAPAROSCOPIC ESOPHAGOGASTRIC FUNDOPLASTY     LAPAROSCOPIC INCISIONAL / UMBILICAL / VENTRAL HERNIA REPAIR     umbilical hernia   OOPHORECTOMY     left   RIGHT OOPHORECTOMY     '05-laparaoscopic   SHOULDER SURGERY Right    THYROIDECTOMY N/A 07/06/2016   Procedure: TOTAL THYROIDECTOMY;  Surgeon: Jackolyn Confer, MD;  Location: [place];  Service: General;  Laterality: N/A;   TONSILLECTOMY     TOTAL THYROIDECTOMY  07/06/2016   Patient Active Problem List   Diagnosis Date Noted   Moderate nonproliferative diabetic retinopathy of left eye ([place]) 10/10/2020  Moderate nonproliferative diabetic retinopathy of right eye ([place]) 08/29/2019   Early stage nonexudative age-related macular degeneration of both eyes 08/29/2019   Right posterior capsular opacification 08/29/2019   Nuclear sclerotic cataract of left eye 08/29/2019   Left thyroid nodule 07/06/2016   Benign neoplasm of skin of upper limb, including shoulder 04/19/2012   Ventral hernia, recurrent 01/26/2011    PCP: Lavone Orn, MD  REFERRING PROVIDER: Lavone Orn, MD  REFERRING DIAG: R26.9 (ICD-10-CM) - Unspecified abnormalities of gait and mobility  THERAPY DIAG:  Abnormality of gait  Muscle weakness (generalized)  Postural low back pain  ONSET DATE:> 6 months ago  SUBJECTIVE: t  SUBJECTIVE STATEMENT: Left leg is shorter; scoliosis. Lives alone. Has rollator but not cane.  Uses rollator when walking "further" distances but does not use consistently.  Grocery shops for her self relying on cart for support. Pain increases as day progresses.     PERTINENT HISTORY: DM, HTN, L and R rotator cuff tear, R rotator cuff repair in approx 2002  PAIN:  Are you having pain? Yes: NPRS scale: 3/10 current 4/10 max;  min 0/10 Pain location: LB Pain description: achy Aggravating factors: standing no more than 10 mins Relieving factors: sitting, lying down  PRECAUTIONS: Fall  WEIGHT BEARING RESTRICTIONS No  FALLS:  Has patient fallen in last 6 months? No  LIVING ENVIRONMENT: Lives with: lives with their family and lives alone Lives in: House/apartment Stairs: No Has following equipment at home: Gilford Rile - 4 wheeled  OCCUPATION: retired  PLOF: Independent  PATIENT GOALS wants to be a little more limber, walk a little farther without pain, decrease pain.   OBJECTIVE:   DIAGNOSTIC FINDINGS: none  PATIENT SURVEYS:  FOTO 43  with goal of 69  COGNITION:  Overall cognitive status: Within functional limits for tasks assessed     SENSATION: WFL    POSTURE:  Idiopathetic scoliosis thoracolumbar with leg length discrepancy L shorter <right Rounded Shoulders;Forward head  PALPATION: Moderately TTP left sacrum about iliac crest   LE ROM:  WFL Lle short than right  LE MMT:  MMT Right 09/03/2021 Left 09/03/2021  Hip flexion 4 4-  Hip extension    Hip abduction 5 5  Hip adduction 5 5  Hip internal rotation    Hip external rotation    Knee flexion 4+ 4+  Knee extension    Ankle dorsiflexion 5 5  Ankle plantarflexion    Ankle inversion    Ankle eversion     (Blank rows = not tested)    FUNCTIONAL TESTS:  5 times sit to stand: 24 Timed up and go (TUG): 23 Berg Balance Scale: 24/56  GAIT: Distance walked: 250 ft Assistive device utilized: Environmental consultant - 4 wheeled and Grab bars Level of assistance: SBA Comments: lateral right displacement, trendelenburg pos left    TODAY'S TREATMENT: Evaluation Objective teating   PATIENT EDUCATION:  Education details: anatomy of condition Person educated:  Patient Education method: Explanation Education comprehension: verbalized understanding   HOME EXERCISE PROGRAM: To be assigned  ASSESSMENT:  CLINICAL IMPRESSION: Patient is a 81 y.o. F who was seen today for physical therapy evaluation and treatment for abnormality of gait and mobility.  Just recently finished therapy for a shoulder dysfunction. She does not consistently use an AD which I do believe would decrease her fall risk and her overall back pain.  Pt does have a membership to [employer] and reports coming 1-2 weekly and using pool.  She will benefit from skilled therapy to improve  strength of core and le as well as balance assigning a HEP for pt to continue with going forward to manage chronic conditions to the best of her ability.   OBJECTIVE IMPAIRMENTS Abnormal gait, decreased activity tolerance, decreased balance, difficulty walking, decreased strength, postural dysfunction, and pain.   ACTIVITY LIMITATIONS cleaning, driving, meal prep, and shopping.   PERSONAL FACTORS Age and 3+ comorbidities: DM, HTN, rotator cuff tears with repairs, idiopathic scoliosis with leg length discrepancy  are also affecting patient's functional outcome.    REHAB POTENTIAL: Good  CLINICAL DECISION MAKING: Evolving/moderate complexity  EVALUATION COMPLEXITY: Moderate   GOALS: Goals reviewed with patient? Yes  SHORT TERM GOALS: Target date: 10/02/2021  Pt will be indep with initial aquatic HEP following at least 1 time weekly using [facility] Baseline:no aquatic HEP Goal status: INITIAL  2.  Pt will attain cane, it will be adjusted for her and she will demonstrate/report use when leaving home consistently Baseline: No cane or consistent use of AD Goal status: INITIAL  3.  Pt will improve on TUG to <18s to demonstrate initial improvement in strength and balance Baseline: 23 Goal status: INITIAL     LONG TERM GOALS: Target date: 10/30/2021  Pt will meet established goal for  [place] Baseline: 43 Goal status: INITIAL  2.  Pt will improve on Berg balance test to 35 or > to demonstrate decreased fall risk. Baseline: 24/56 Goal status: INITIAL  3.  Pt will improve on 5x STS test to <15s to demonstrate improved strength Baseline: 24 Goal status: INITIAL  4.  Pt will be indep with final aquatic HEP Baseline: no aquatic HEP Goal status: INITIAL   PLAN: PT FREQUENCY: 1-2x/week  PT DURATION: 8 weeks  PLANNED INTERVENTIONS: Therapeutic exercises, Therapeutic activity, Neuromuscular re-education, Balance training, Gait training, Patient/Family education, Joint mobilization, Stair training, Aquatic Therapy, Dry Needling, Electrical stimulation, Taping, and Manual therapy  PLAN FOR NEXT SESSION: aquatics for core and le strengthening, balance retraining   Briele (Frankie) Rubi Tooley MPT 09/03/2021, 6:57 PM   Addended 01/22/22 Annamarie Major) [place] MPT

## 2021-09-08 DIAGNOSIS — L723 Sebaceous cyst: Secondary | ICD-10-CM | POA: Diagnosis not present

## 2021-09-10 ENCOUNTER — Telehealth (HOSPITAL_BASED_OUTPATIENT_CLINIC_OR_DEPARTMENT_OTHER): Payer: Self-pay | Admitting: Physical Therapy

## 2021-09-10 ENCOUNTER — Ambulatory Visit (HOSPITAL_BASED_OUTPATIENT_CLINIC_OR_DEPARTMENT_OTHER): Payer: Medicare Other | Admitting: Physical Therapy

## 2021-09-10 NOTE — Telephone Encounter (Signed)
Spoke with pt.  She reports she has an epidermoid cyst under her arm which was lanced last week which continues to give her problems. She was unable to find the correct number to call and cancel PT visit today.  She was given the number today and will call back next week if needing to cancel next appointment. ?

## 2021-09-12 DIAGNOSIS — L723 Sebaceous cyst: Secondary | ICD-10-CM | POA: Diagnosis not present

## 2021-09-17 DIAGNOSIS — J310 Chronic rhinitis: Secondary | ICD-10-CM | POA: Diagnosis not present

## 2021-09-17 DIAGNOSIS — J343 Hypertrophy of nasal turbinates: Secondary | ICD-10-CM | POA: Diagnosis not present

## 2021-09-17 DIAGNOSIS — J324 Chronic pansinusitis: Secondary | ICD-10-CM | POA: Diagnosis not present

## 2021-09-17 DIAGNOSIS — J342 Deviated nasal septum: Secondary | ICD-10-CM | POA: Diagnosis not present

## 2021-09-19 ENCOUNTER — Ambulatory Visit (HOSPITAL_BASED_OUTPATIENT_CLINIC_OR_DEPARTMENT_OTHER): Payer: Medicare Other | Admitting: Physical Therapy

## 2021-09-22 DIAGNOSIS — R002 Palpitations: Secondary | ICD-10-CM | POA: Diagnosis not present

## 2021-09-25 ENCOUNTER — Ambulatory Visit (HOSPITAL_BASED_OUTPATIENT_CLINIC_OR_DEPARTMENT_OTHER): Payer: Medicare Other | Admitting: Physical Therapy

## 2021-09-25 NOTE — Therapy (Incomplete)
OUTPATIENT PHYSICAL THERAPY TREATMENT NOTE   Patient Name: Annette Gross MRN: [id number] DOB:Jul 31, 1940, 81 y.o., female Today's Date: 09/25/2021  PCP: Lavone Orn, MD REFERRING PROVIDER: Lavone Orn, MD  END OF SESSION:    Past Medical History:  Diagnosis Date   Anemia    many yrs ago   Anxiety    Asthma    no recent issues   Cancer [facility])    s/p thyroidectomy 1638   Complication of anesthesia    Cystitis, interstitial    Degenerative disc disease, lumbar    Degenerative joint disease    spinal stenosis "Chronic low back pain"   Depression    Diabetes mellitus    diet control.   Diverticulitis 2005   Diverticulosis 2005   Factor V deficiency ([place])    Fibromyalgia    Fracture of right foot 1995   GERD (gastroesophageal reflux disease)    History of hiatal hernia    Hyperlipidemia    Hypertension    off med x 5 yrs.   IBS (irritable bowel syndrome)    Insomnia    Iron deficiency    Liver hemangioma 1999   MRI   Mononucleosis    Morton's neuroma    Left foot   Peripheral neuropathy    Pneumonia    years 89 & 89.  None since   PONV (postoperative nausea and vomiting)    nausea no vomiting   Rectocele    Transfusion history    child x2, many yrs ago after childbirth"hemorrhage"   Umbilical hernia 4536   Past Surgical History:  Procedure Laterality Date   blood vessel tumor removal  1980   from chin   CHOLECYSTECTOMY     COLONOSCOPY WITH PROPOFOL N/A 09/02/2015   Procedure: COLONOSCOPY WITH PROPOFOL;  Surgeon: Garlan Fair, MD;  Location: [facility];  Service: Endoscopy;  Laterality: N/A;   GANGLION CYST EXCISION     right   HEMORRHOID SURGERY     HERNIA REPAIR     repair was aimed at the [place]     and nissen fundoplication   LAPAROSCOPIC ESOPHAGOGASTRIC FUNDOPLASTY     LAPAROSCOPIC INCISIONAL / UMBILICAL / VENTRAL HERNIA REPAIR     umbilical hernia   OOPHORECTOMY     left   RIGHT OOPHORECTOMY     '05-laparaoscopic    SHOULDER SURGERY Right    THYROIDECTOMY N/A 07/06/2016   Procedure: TOTAL THYROIDECTOMY;  Surgeon: Jackolyn Confer, MD;  Location: [place];  Service: General;  Laterality: N/A;   TONSILLECTOMY     TOTAL THYROIDECTOMY  07/06/2016   Patient Active Problem List   Diagnosis Date Noted   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  Rationale for Evaluation and Treatment Rehabilitation  SUBJECTIVE STATEMENT: Left leg is shorter; scoliosis. Lives alone. Has rollator but not cane.  Uses rollator when walking "further" distances but does not use consistently.  Grocery shops for her self relying on cart for support. Pain increases  as day progresses.     PERTINENT HISTORY: DM, HTN, L and R rotator cuff tear, R rotator cuff repair in approx 2002   PAIN:  Are you having pain? Yes: NPRS scale: 3/10 current 4/10 max;  min 0/10 Pain location: LB Pain description: achy Aggravating factors: standing no more than 10 mins Relieving factors: sitting, lying down   PRECAUTIONS: Fall   WEIGHT BEARING RESTRICTIONS No   FALLS:  Has patient fallen in last 6 months? No   LIVING ENVIRONMENT: Lives with: lives with their family and lives alone Lives in: House/apartment Stairs: No Has following equipment at home: Gilford Rile - 4 wheeled   OCCUPATION: retired   PLOF: Independent   PATIENT GOALS wants to be a little more limber, walk a little farther  without pain, decrease pain.     OBJECTIVE:    DIAGNOSTIC FINDINGS: none   PATIENT SURVEYS:  FOTO 43  with goal of 34   COGNITION:           Overall cognitive status: Within functional limits for tasks assessed                          SENSATION: WFL       POSTURE:  Idiopathetic scoliosis thoracolumbar with leg length discrepancy L shorter <right Rounded Shoulders;Forward head   PALPATION: Moderately TTP left sacrum about iliac crest     LE ROM:   WFL Lle short than right   LE MMT:   MMT Right 09/03/2021 Left 09/03/2021  Hip flexion 4 4-  Hip extension      Hip abduction 5 5  Hip adduction 5 5  Hip internal rotation      Hip external rotation      Knee flexion 4+ 4+  Knee extension      Ankle dorsiflexion 5 5  Ankle plantarflexion      Ankle inversion      Ankle eversion       (Blank rows = not tested)       FUNCTIONAL TESTS:  5 times sit to stand: 24 Timed up and go (TUG): 23 Berg Balance Scale: 24/56   GAIT: Distance walked: 250 ft Assistive device utilized: Environmental consultant - 4 wheeled and Grab bars Level of assistance: SBA Comments: lateral right displacement, trendelenburg pos left       TODAY'S TREATMENT: Evaluation Objective teating     PATIENT EDUCATION:  Education details: anatomy of condition Person educated: Patient Education method: Explanation Education comprehension: verbalized understanding     HOME EXERCISE PROGRAM: To be assigned   ASSESSMENT:   CLINICAL IMPRESSION: Patient is a 81 y.o. F who was seen today for physical therapy evaluation and treatment for abnormality of gait and mobility.  Just recently finished therapy for a shoulder dysfunction. She does not consistently use an AD which I do believe would decrease her fall risk and her overall back pain.  Pt does have a membership to [employer] and reports coming 1-2 weekly and using pool.  She will benefit from skilled therapy to improve strength of core and le as well as balance  assigning a HEP for pt to continue with going forward to manage chronic conditions to the best of her ability.     OBJECTIVE IMPAIRMENTS Abnormal gait, decreased activity tolerance, decreased balance, difficulty walking, decreased strength, postural dysfunction, and pain.    ACTIVITY LIMITATIONS cleaning, driving, meal prep, and shopping.    PERSONAL FACTORS Age and 3+ comorbidities: DM,  HTN, rotator cuff tears with repairs, idiopathic scoliosis with leg length discrepancy  are also affecting patient's functional outcome.      REHAB POTENTIAL: Good   CLINICAL DECISION MAKING: Evolving/moderate complexity   EVALUATION COMPLEXITY: Moderate     GOALS: Goals reviewed with patient? Yes   SHORT TERM GOALS: Target date: 10/02/2021   Pt will be indep with initial aquatic HEP following at least 1 time weekly using [employer] Baseline:no aquatic HEP Goal status: INITIAL   2.  Pt will attain cane, it will be adjusted for her and she will demonstrate/report use when leaving home consistently Baseline: No cane or consistent use of AD Goal status: INITIAL   3.  Pt will improve on TUG to <18s to demonstrate initial improvement in strength and balance Baseline: 23 Goal status: INITIAL         LONG TERM GOALS: Target date: 10/30/2021   Pt will meet established goal for Foto Baseline: 43 Goal status: INITIAL   2.  Pt will improve on Berg balance test to 35 or > to demonstrate decreased fall risk. Baseline: 24/56 Goal status: INITIAL   3.  Pt will improve on 5x STS test to <15s to demonstrate improved strength Baseline: 24 Goal status: INITIAL   4.  Pt will be indep with final aquatic HEP Baseline: no aquatic HEP Goal status: INITIAL     PLAN: PT FREQUENCY: 1-2x/week   PT DURATION: 8 weeks   PLANNED INTERVENTIONS: Therapeutic exercises, Therapeutic activity, Neuromuscular re-education, Balance training, Gait training, Patient/Family education, Joint mobilization, Stair training,  Aquatic Therapy, Dry Needling, Electrical stimulation, Taping, and Manual therapy   PLAN FOR NEXT SESSION: aquatics for core and le strengthening, balance retraining    Annette Gross MPT 09/25/2021, 2:16 PM

## 2021-09-26 DIAGNOSIS — I10 Essential (primary) hypertension: Secondary | ICD-10-CM | POA: Diagnosis not present

## 2021-09-26 DIAGNOSIS — E119 Type 2 diabetes mellitus without complications: Secondary | ICD-10-CM | POA: Diagnosis not present

## 2021-09-26 DIAGNOSIS — E7800 Pure hypercholesterolemia, unspecified: Secondary | ICD-10-CM | POA: Diagnosis not present

## 2021-10-02 ENCOUNTER — Ambulatory Visit (HOSPITAL_BASED_OUTPATIENT_CLINIC_OR_DEPARTMENT_OTHER): Payer: Medicare Other | Admitting: Physical Therapy

## 2021-10-02 ENCOUNTER — Ambulatory Visit
Admission: RE | Admit: 2021-10-02 | Discharge: 2021-10-02 | Disposition: A | Discharge status: Home / Self Care | Payer: Medicare Other | Source: Ambulatory Visit | Attending: Physician Assistant | Admitting: Physician Assistant

## 2021-10-02 ENCOUNTER — Other Ambulatory Visit: Payer: Self-pay | Admitting: Physician Assistant

## 2021-10-02 DIAGNOSIS — M79675 Pain in left toe(s): Secondary | ICD-10-CM | POA: Diagnosis not present

## 2021-10-02 DIAGNOSIS — M7989 Other specified soft tissue disorders: Secondary | ICD-10-CM | POA: Diagnosis not present

## 2021-10-02 DIAGNOSIS — I10 Essential (primary) hypertension: Secondary | ICD-10-CM | POA: Diagnosis not present

## 2021-10-07 ENCOUNTER — Ambulatory Visit (HOSPITAL_BASED_OUTPATIENT_CLINIC_OR_DEPARTMENT_OTHER): Payer: Medicare Other | Admitting: Physical Therapy

## 2021-10-09 DIAGNOSIS — D492 Neoplasm of unspecified behavior of bone, soft tissue, and skin: Secondary | ICD-10-CM | POA: Diagnosis not present

## 2021-10-09 DIAGNOSIS — D485 Neoplasm of uncertain behavior of skin: Secondary | ICD-10-CM | POA: Diagnosis not present

## 2021-10-09 DIAGNOSIS — L538 Other specified erythematous conditions: Secondary | ICD-10-CM | POA: Diagnosis not present

## 2021-10-09 DIAGNOSIS — L723 Sebaceous cyst: Secondary | ICD-10-CM | POA: Diagnosis not present

## 2021-10-10 ENCOUNTER — Ambulatory Visit (HOSPITAL_BASED_OUTPATIENT_CLINIC_OR_DEPARTMENT_OTHER): Payer: Medicare Other | Admitting: Physical Therapy

## 2021-10-13 ENCOUNTER — Ambulatory Visit (INDEPENDENT_AMBULATORY_CARE_PROVIDER_SITE_OTHER): Payer: Medicare Other | Admitting: Ophthalmology

## 2021-10-13 ENCOUNTER — Encounter (INDEPENDENT_AMBULATORY_CARE_PROVIDER_SITE_OTHER): Payer: Self-pay | Admitting: Ophthalmology

## 2021-10-13 DIAGNOSIS — H02835 Dermatochalasis of left lower eyelid: Secondary | ICD-10-CM

## 2021-10-13 DIAGNOSIS — E113392 Type 2 diabetes mellitus with moderate nonproliferative diabetic retinopathy without macular edema, left eye: Secondary | ICD-10-CM | POA: Diagnosis not present

## 2021-10-13 DIAGNOSIS — E113391 Type 2 diabetes mellitus with moderate nonproliferative diabetic retinopathy without macular edema, right eye: Secondary | ICD-10-CM

## 2021-10-13 DIAGNOSIS — H02832 Dermatochalasis of right lower eyelid: Secondary | ICD-10-CM

## 2021-10-13 DIAGNOSIS — Z961 Presence of intraocular lens: Secondary | ICD-10-CM | POA: Diagnosis not present

## 2021-10-13 NOTE — Assessment & Plan Note (Signed)
Physiologic currently

## 2021-10-13 NOTE — Assessment & Plan Note (Signed)
Stable looks great

## 2021-10-13 NOTE — Assessment & Plan Note (Signed)

## 2021-10-13 NOTE — Progress Notes (Signed)
10/13/2021     CHIEF COMPLAINT Patient presents for  Chief Complaint  Patient presents with   Diabetic Retinopathy without Macular Edema      HISTORY OF PRESENT ILLNESS: Annette Gross is a 81 y.o. female who presents to the clinic today for:   HPI   1 YR FU OU OCT FP. Pt stated vision has gradually gotten worse since last visit. "Ive noticed that my close vision is worse and is blurry in both eyes. Its been gradually getting worse. I notice a change about 8 months ago." Pt denies floaters and FOL. Pt has allergy to CIPRO.     Last edited by Silvestre Moment on 10/13/2021 10:52 AM.      Referring physician: Debbra Riding, MD [address] [place],  [place] [address]  HISTORICAL INFORMATION:   Selected notes from the MEDICAL RECORD NUMBER    Lab Results  Component Value Date   HGBA1C 7.3 (H) 06/29/2016     CURRENT MEDICATIONS: Current Outpatient Medications (Ophthalmic Drugs)  Medication Sig   Propylene Glycol (SYSTANE BALANCE OP) Place 1 drop into both eyes daily as needed (dry eyes). (Patient not taking: Reported on 03/19/2020)   No current facility-administered medications for this visit. (Ophthalmic Drugs)   Current Outpatient Medications (Other)  Medication Sig   acetaminophen (TYLENOL) 500 MG tablet Take 1,000 mg by mouth 2 (two) times daily.   albuterol (PROVENTIL HFA;VENTOLIN HFA) 108 (90 Base) MCG/ACT inhaler Inhale 2 puffs into the lungs every 4 (four) hours as needed for wheezing or shortness of breath.   amoxicillin-clavulanate (AUGMENTIN) 875-125 MG tablet Take 1 tablet by mouth 2 (two) times daily. (Patient not taking: Reported on 08/13/2017)   calcitRIOL (ROCALTROL) 0.25 MCG capsule Take 1 capsule (0.25 mcg total) by mouth daily. (Patient not taking: Reported on 03/19/2020)   calcium carbonate (OS-CAL - DOSED IN MG OF ELEMENTAL CALCIUM) 1250 (500 Ca) MG tablet Take 4 tablets (2,000 mg of elemental calcium total) by mouth 4 (four) times daily.  (Patient not taking: Reported on 08/13/2017)   carvedilol (COREG) 6.25 MG tablet Take 1 tablet (6.25 mg total) by mouth 2 (two) times daily with a meal. (Patient not taking: Reported on 08/13/2017)   Cholecalciferol (VITAMIN D3) 2000 units TABS Take 4,000 Units by mouth daily.   COVID-19 mRNA bivalent vaccine, Pfizer, (PFIZER COVID-19 VAC BIVALENT) injection Inject into the muscle.   COVID-19 mRNA Vac-TriS, Pfizer, (PFIZER-BIONT COVID-19 VAC-TRIS) SUSP injection Inject into the muscle.   docusate sodium (COLACE) 100 MG capsule Take 100 mg by mouth 2 (two) times daily as needed for constipation.   DULoxetine (CYMBALTA) 30 MG capsule Take 30 mg by mouth daily.   EPINEPHrine 0.3 mg/0.3 mL IJ SOAJ injection Inject 0.3 mg into the muscle as needed.   feeding supplement (BOOST / RESOURCE BREEZE) LIQD Take 1 Container by mouth 2 (two) times daily between meals. (Patient not taking: Reported on 08/13/2017)   fluticasone (FLONASE) 50 MCG/ACT nasal spray Place 2 sprays into the nose daily.    levocetirizine (XYZAL) 5 MG tablet Take 5 mg by mouth every evening.   levothyroxine (SYNTHROID, LEVOTHROID) 25 MCG tablet Take 25 mcg by mouth daily before breakfast.   lisinopril (PRINIVIL,ZESTRIL) 10 MG tablet Take 1 tablet (10 mg total) by mouth daily. (Patient not taking: Reported on 08/13/2017)   metFORMIN (GLUCOPHAGE) 500 MG tablet Take 1 tablet (500 mg total) by mouth daily with breakfast. (Patient not taking: Reported on 08/13/2017)   montelukast (  SINGULAIR) 10 MG tablet Take 10 mg by mouth every evening.   Multiple Vitamin (MULTIVITAMIN) tablet Take 1 tablet by mouth daily.   (Patient not taking: Reported on 03/19/2020)   Multiple Vitamins-Minerals (MULTIVITAMIN ADULT) CHEW Chew 2 each by mouth daily.   omeprazole (PRILOSEC) 40 MG capsule Take 40 mg by mouth daily.     No current facility-administered medications for this visit. (Other)      REVIEW OF SYSTEMS: ROS   Negative for: Constitutional,  Gastrointestinal, Neurological, Skin, Genitourinary, Musculoskeletal, HENT, Endocrine, Cardiovascular, Eyes, Respiratory, Psychiatric, Allergic/Imm, Heme/Lymph Last edited by Silvestre Moment on 10/13/2021 10:52 AM.       ALLERGIES Allergies  Allergen Reactions   Ciprofloxacin Other (See Comments)    Tendonitis   Iohexol Anaphylaxis   Ivp Dye [Iodinated Contrast Media] Anaphylaxis   Kiwi Extract Anaphylaxis   Gabapentin Other (See Comments)    Auditory hallucinations   Sulfa Antibiotics Hives    Covered legs   Aspirin Other (See Comments)    Stomach pain   Lexapro [Escitalopram Oxalate] Other (See Comments)    Muscle pain   Lipitor [Atorvastatin] Other (See Comments)    Muscle pain   Codeine Other (See Comments)    Severe stomach cramping.   Diphenoxylate-Atropine Other (See Comments)    Severe stomach and dizziness    PAST MEDICAL HISTORY Past Medical History:  Diagnosis Date   Anemia    many yrs ago   Anxiety    Asthma    no recent issues   Cancer ([place])    s/p thyroidectomy 5465   Complication of anesthesia    Cystitis, interstitial    Degenerative disc disease, lumbar    Degenerative joint disease    spinal stenosis "Chronic low back pain"   Depression    Diabetes mellitus    diet control.   Diverticulitis 2005   Diverticulosis 2005   Factor V deficiency ([place])    Fibromyalgia    Fracture of right foot 1995   GERD (gastroesophageal reflux disease)    History of hiatal hernia    Hyperlipidemia    Hypertension    off med x 5 yrs.   IBS (irritable bowel syndrome)    Insomnia    Iron deficiency    Liver hemangioma 1999   MRI   Mononucleosis    Morton's neuroma    Left foot   Nuclear sclerotic cataract of left eye 08/29/2019   Peripheral neuropathy    Pneumonia    years 89 & 89.  None since   PONV (postoperative nausea and vomiting)    nausea no vomiting   Rectocele    Transfusion history    child x2, many yrs ago after childbirth"hemorrhage"   Umbilical  hernia 6812   Past Surgical History:  Procedure Laterality Date   blood vessel tumor removal  1980   from chin   CHOLECYSTECTOMY     COLONOSCOPY WITH PROPOFOL N/A 09/02/2015   Procedure: COLONOSCOPY WITH PROPOFOL;  Surgeon: Garlan Fair, MD;  [facility];  Service: Endoscopy;  Laterality: N/A;   GANGLION CYST EXCISION     right   HEMORRHOID SURGERY     HERNIA REPAIR     repair was aimed at the [place]     and nissen fundoplication   LAPAROSCOPIC ESOPHAGOGASTRIC FUNDOPLASTY     LAPAROSCOPIC INCISIONAL / UMBILICAL / VENTRAL HERNIA REPAIR     umbilical hernia   OOPHORECTOMY     left  RIGHT OOPHORECTOMY     '05-laparaoscopic   SHOULDER SURGERY Right    THYROIDECTOMY N/A 07/06/2016   Procedure: TOTAL THYROIDECTOMY;  Surgeon: Jackolyn Confer, MD;  Location: [place];  Service: General;  Laterality: N/A;   TONSILLECTOMY     TOTAL THYROIDECTOMY  07/06/2016    FAMILY HISTORY Family History  Problem Relation Age of Onset   Stroke Father    Heart disease Father     SOCIAL HISTORY Social History   Tobacco Use   Smoking status: Never   Smokeless tobacco: Never  Substance Use Topics   Alcohol use: Yes    Alcohol/week: 2.0 standard drinks of alcohol    Types: 2 Glasses of wine per week    Comment: social   Drug use: No         OPHTHALMIC EXAM:  Base Eye Exam     Visual Acuity (ETDRS)       Right Left   Dist  20/30 +2 20/20         Tonometry (Tonopen, 10:57 AM)       Right Left   Pressure 18 16         Pupils       Pupils Dark Light Shape React APD   Right PERRL 5 4 Round Brisk None   Left PERRL 5 4 Round Brisk None         Visual Fields       Left Right    Full Full         Extraocular Movement       Right Left    Full Full         Neuro/Psych     Oriented x3: Yes   Mood/Affect: Normal         Dilation     Both eyes: 1.0% Mydriacyl, 2.5% Phenylephrine @ 10:57 AM           Slit Lamp and  Fundus Exam     External Exam       Right Left   External Normal Normal         Slit Lamp Exam       Right Left   Lids/Lashes Dermatochalasis - upper lid Dermatochalasis - upper lid   Conjunctiva/Sclera White and quiet White and quiet   Cornea Clear Clear   Anterior Chamber Deep and quiet Deep and quiet   Iris Round and reactive Round and reactive   Lens Posterior chamber intraocular lens Posterior chamber intraocular lens   Anterior Vitreous Normal Normal         Fundus Exam       Right Left   Posterior Vitreous Normal Normal   Disc Normal Normal   C/D Ratio 0.2 0.25   Macula Hard drusen, no macular thickening Hard drusen, no macular thickening   Vessels NPDR- Mild NPDR- Mild   Periphery Normal Normal            IMAGING AND PROCEDURES  Imaging and Procedures for 10/13/21  OCT, Retina - OU - Both Eyes       Right Eye Quality was good. Scan locations included subfoveal. Central Foveal Thickness: 322. Progression has been stable. Findings include normal observations, retinal drusen .   Left Eye Quality was good. Scan locations included subfoveal. Central Foveal Thickness: 321. Progression has been stable. Findings include retinal drusen .   Notes No active maculopathy     Color Fundus Photography Optos - OU - Both Eyes  ASSESSMENT/PLAN:  Moderate nonproliferative diabetic retinopathy of left eye (HCC) The nature of age--related macular degeneration was discussed with the patient as well as the distinction between dry and wet types. Checking an [place]ler Grid daily with advice to return immediately should a distortion develop, was given to the patient. The patient 's smoking status now and in the past was determined and advice based on the AREDS study was provided regarding the consumption of antioxidant supplements. AREDS 2 vitamin formulation was recommended. Consumption of dark leafy vegetables and fresh fruits of various colors was  recommended. Treatment modalities for wet macular degeneration particularly the use of intravitreal injections of anti-blood vessel growth factors was discussed with the patient. Avastin, Lucentis, and Eylea are the available options. On occasion, therapy includes the use of photodynamic therapy and thermal laser. Stressed to the patient do not rub eyes.  Patient was advised to check Amsler Grid daily and return immediately if changes are noted. Instructions on using the grid were given to the patient. All patient questions were answered.  Moderate nonproliferative diabetic retinopathy of right eye ([place]) The nature of age--related macular degeneration was discussed with the patient as well as the distinction between dry and wet types. Checking an Amsler Grid daily with advice to return immediately should a distortion develop, was given to the patient. The patient 's smoking status now and in the past was determined and advice based on the AREDS study was provided regarding the consumption of antioxidant supplements. AREDS 2 vitamin formulation was recommended. Consumption of dark leafy vegetables and fresh fruits of various colors was recommended. Treatment modalities for wet macular degeneration particularly the use of intravitreal injections of anti-blood vessel growth factors was discussed with the patient. Avastin, Lucentis, and Eylea are the available options. On occasion, therapy includes the use of photodynamic therapy and thermal laser. Stressed to the patient do not rub eyes.  Patient was advised to check Amsler Grid daily and return immediately if changes are noted. Instructions on using the grid were given to the patient. All patient questions were answered.  Pseudophakia, both eyes Stable looks great  Dermatochalasis of both lower eyelids Physiologic currently     ICD-10-CM   1. Moderate nonproliferative diabetic retinopathy of right eye without macular edema associated with type 2 diabetes  mellitus (HCC)  E11.3391 OCT, Retina - OU - Both Eyes    Color Fundus Photography Optos - OU - Both Eyes    2. 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 Pseudophakia, both eyes  Z96.1     4. Dermatochalasis of both lower eyelids  H02.832    H02.835       1.  OU with mild to moderate NPDR.  With excellent blood sugar control.  2.  Very early ARMD.  Does not qualify for injectable medications to control dry AMD.  Reviewed with the patient.  3.  Ophthalmic Meds Ordered this visit:  No orders of the defined types were placed in this encounter.      Return in about 1 year (around 10/14/2022) for DILATE OU, COLOR FP, OCT.  There are no Patient Instructions on file for this visit.   Explained the diagnoses, plan, and follow up with the patient and they expressed understanding.  Patient expressed understanding of the importance of proper follow up care.   Clent Demark Eeshan Verbrugge M.D. Diseases & Surgery of the Retina and Vitreous [facility] 10/13/21     Abbreviations: M myopia (nearsighted); A astigmatism; H hyperopia (farsighted); P presbyopia; Mrx spectacle prescription;  CTL contact lenses; OD right eye; OS left eye; OU both eyes  XT exotropia; ET esotropia; PEK punctate epithelial keratitis; PEE punctate epithelial erosions; DES dry eye syndrome; MGD meibomian gland dysfunction; ATs artificial tears; PFAT's preservative free artificial tears; [place] nuclear sclerotic cataract; PSC posterior subcapsular cataract; ERM epi-retinal membrane; PVD posterior vitreous detachment; RD retinal detachment; DM diabetes mellitus; DR diabetic retinopathy; NPDR non-proliferative diabetic retinopathy; PDR proliferative diabetic retinopathy; CSME clinically significant macular edema; DME diabetic macular edema; dbh dot blot hemorrhages; CWS cotton wool spot; POAG primary open angle glaucoma;  C/D cup-to-disc ratio; HVF humphrey visual field; GVF goldmann visual field; OCT optical coherence tomography; IOP intraocular pressure; BRVO Branch retinal vein occlusion; CRVO central retinal vein occlusion; CRAO central retinal artery occlusion; BRAO branch retinal artery occlusion; RT retinal tear; SB scleral buckle; PPV pars plana vitrectomy; VH Vitreous hemorrhage; PRP panretinal laser photocoagulation; IVK intravitreal kenalog; VMT vitreomacular traction; MH Macular hole;  NVD neovascularization of the disc; NVE neovascularization elsewhere; AREDS age related eye disease study; ARMD age related macular degeneration; POAG primary open angle glaucoma; EBMD epithelial/anterior basement membrane dystrophy; ACIOL anterior chamber intraocular lens; IOL intraocular lens; PCIOL posterior chamber intraocular lens; Phaco/IOL phacoemulsification with intraocular lens placement; [place] photorefractive keratectomy; LASIK laser assisted in situ keratomileusis; HTN hypertension; DM diabetes mellitus; COPD chronic obstructive pulmonary disease

## 2021-10-15 ENCOUNTER — Ambulatory Visit (HOSPITAL_BASED_OUTPATIENT_CLINIC_OR_DEPARTMENT_OTHER): Payer: Medicare Other | Admitting: Physical Therapy

## 2021-10-16 DIAGNOSIS — I10 Essential (primary) hypertension: Secondary | ICD-10-CM | POA: Diagnosis not present

## 2021-10-16 DIAGNOSIS — E890 Postprocedural hypothyroidism: Secondary | ICD-10-CM | POA: Diagnosis not present

## 2021-10-16 DIAGNOSIS — E119 Type 2 diabetes mellitus without complications: Secondary | ICD-10-CM | POA: Diagnosis not present

## 2021-10-17 ENCOUNTER — Ambulatory Visit (HOSPITAL_BASED_OUTPATIENT_CLINIC_OR_DEPARTMENT_OTHER): Payer: Medicare Other | Admitting: Physical Therapy

## 2021-10-20 DIAGNOSIS — H5212 Myopia, left eye: Secondary | ICD-10-CM | POA: Diagnosis not present

## 2021-10-20 DIAGNOSIS — Z961 Presence of intraocular lens: Secondary | ICD-10-CM | POA: Diagnosis not present

## 2021-10-20 DIAGNOSIS — H353131 Nonexudative age-related macular degeneration, bilateral, early dry stage: Secondary | ICD-10-CM | POA: Diagnosis not present

## 2021-10-20 DIAGNOSIS — E119 Type 2 diabetes mellitus without complications: Secondary | ICD-10-CM | POA: Diagnosis not present

## 2021-10-20 DIAGNOSIS — H04123 Dry eye syndrome of bilateral lacrimal glands: Secondary | ICD-10-CM | POA: Diagnosis not present

## 2021-11-17 DIAGNOSIS — I10 Essential (primary) hypertension: Secondary | ICD-10-CM | POA: Diagnosis not present

## 2021-11-17 DIAGNOSIS — E7800 Pure hypercholesterolemia, unspecified: Secondary | ICD-10-CM | POA: Diagnosis not present

## 2021-11-17 DIAGNOSIS — E890 Postprocedural hypothyroidism: Secondary | ICD-10-CM | POA: Diagnosis not present

## 2021-11-17 DIAGNOSIS — E119 Type 2 diabetes mellitus without complications: Secondary | ICD-10-CM | POA: Diagnosis not present

## 2021-11-17 DIAGNOSIS — K219 Gastro-esophageal reflux disease without esophagitis: Secondary | ICD-10-CM | POA: Diagnosis not present

## 2021-12-22 DIAGNOSIS — E119 Type 2 diabetes mellitus without complications: Secondary | ICD-10-CM | POA: Diagnosis not present

## 2021-12-22 DIAGNOSIS — I10 Essential (primary) hypertension: Secondary | ICD-10-CM | POA: Diagnosis not present

## 2021-12-22 DIAGNOSIS — E890 Postprocedural hypothyroidism: Secondary | ICD-10-CM | POA: Diagnosis not present

## 2021-12-22 DIAGNOSIS — E782 Mixed hyperlipidemia: Secondary | ICD-10-CM | POA: Diagnosis not present

## 2021-12-24 DIAGNOSIS — M19071 Primary osteoarthritis, right ankle and foot: Secondary | ICD-10-CM | POA: Diagnosis not present

## 2021-12-24 DIAGNOSIS — M25572 Pain in left ankle and joints of left foot: Secondary | ICD-10-CM | POA: Diagnosis not present

## 2021-12-24 DIAGNOSIS — M25571 Pain in right ankle and joints of right foot: Secondary | ICD-10-CM | POA: Diagnosis not present

## 2021-12-24 DIAGNOSIS — M19072 Primary osteoarthritis, left ankle and foot: Secondary | ICD-10-CM | POA: Diagnosis not present

## 2022-01-14 DIAGNOSIS — K219 Gastro-esophageal reflux disease without esophagitis: Secondary | ICD-10-CM | POA: Diagnosis not present

## 2022-01-14 DIAGNOSIS — I10 Essential (primary) hypertension: Secondary | ICD-10-CM | POA: Diagnosis not present

## 2022-01-14 DIAGNOSIS — E782 Mixed hyperlipidemia: Secondary | ICD-10-CM | POA: Diagnosis not present

## 2022-01-14 DIAGNOSIS — E119 Type 2 diabetes mellitus without complications: Secondary | ICD-10-CM | POA: Diagnosis not present

## 2022-01-19 DIAGNOSIS — Z85850 Personal history of malignant neoplasm of thyroid: Secondary | ICD-10-CM | POA: Diagnosis not present

## 2022-01-19 DIAGNOSIS — E890 Postprocedural hypothyroidism: Secondary | ICD-10-CM | POA: Diagnosis not present

## 2022-01-27 DIAGNOSIS — I739 Peripheral vascular disease, unspecified: Secondary | ICD-10-CM | POA: Diagnosis not present

## 2022-01-27 DIAGNOSIS — Z23 Encounter for immunization: Secondary | ICD-10-CM | POA: Diagnosis not present

## 2022-01-27 DIAGNOSIS — Z7984 Long term (current) use of oral hypoglycemic drugs: Secondary | ICD-10-CM | POA: Diagnosis not present

## 2022-01-27 DIAGNOSIS — E1169 Type 2 diabetes mellitus with other specified complication: Secondary | ICD-10-CM | POA: Diagnosis not present

## 2022-01-27 DIAGNOSIS — E782 Mixed hyperlipidemia: Secondary | ICD-10-CM | POA: Diagnosis not present

## 2022-01-27 DIAGNOSIS — R1310 Dysphagia, unspecified: Secondary | ICD-10-CM | POA: Diagnosis not present

## 2022-01-27 DIAGNOSIS — I10 Essential (primary) hypertension: Secondary | ICD-10-CM | POA: Diagnosis not present

## 2022-02-05 ENCOUNTER — Other Ambulatory Visit: Payer: Self-pay | Admitting: Physician Assistant

## 2022-02-05 DIAGNOSIS — R1310 Dysphagia, unspecified: Secondary | ICD-10-CM

## 2022-02-10 ENCOUNTER — Ambulatory Visit
Admission: RE | Admit: 2022-02-10 | Discharge: 2022-02-10 | Disposition: A | Discharge status: Home / Self Care | Payer: Medicare Other | Source: Ambulatory Visit | Attending: Physician Assistant | Admitting: Physician Assistant

## 2022-02-10 ENCOUNTER — Other Ambulatory Visit: Payer: Self-pay | Admitting: Physician Assistant

## 2022-02-10 DIAGNOSIS — R1310 Dysphagia, unspecified: Secondary | ICD-10-CM

## 2022-02-10 DIAGNOSIS — K224 Dyskinesia of esophagus: Secondary | ICD-10-CM | POA: Diagnosis not present

## 2022-02-13 DIAGNOSIS — E782 Mixed hyperlipidemia: Secondary | ICD-10-CM | POA: Diagnosis not present

## 2022-02-13 DIAGNOSIS — I10 Essential (primary) hypertension: Secondary | ICD-10-CM | POA: Diagnosis not present

## 2022-02-13 DIAGNOSIS — G4700 Insomnia, unspecified: Secondary | ICD-10-CM | POA: Diagnosis not present

## 2022-02-13 DIAGNOSIS — K219 Gastro-esophageal reflux disease without esophagitis: Secondary | ICD-10-CM | POA: Diagnosis not present

## 2022-02-13 DIAGNOSIS — E119 Type 2 diabetes mellitus without complications: Secondary | ICD-10-CM | POA: Diagnosis not present

## 2022-02-26 DIAGNOSIS — Z03818 Encounter for observation for suspected exposure to other biological agents ruled out: Secondary | ICD-10-CM | POA: Diagnosis not present

## 2022-02-26 DIAGNOSIS — R52 Pain, unspecified: Secondary | ICD-10-CM | POA: Diagnosis not present

## 2022-03-10 DIAGNOSIS — M2141 Flat foot [pes planus] (acquired), right foot: Secondary | ICD-10-CM | POA: Diagnosis not present

## 2022-03-13 DIAGNOSIS — J4530 Mild persistent asthma, uncomplicated: Secondary | ICD-10-CM | POA: Diagnosis not present

## 2022-03-13 DIAGNOSIS — E7800 Pure hypercholesterolemia, unspecified: Secondary | ICD-10-CM | POA: Diagnosis not present

## 2022-03-13 DIAGNOSIS — I10 Essential (primary) hypertension: Secondary | ICD-10-CM | POA: Diagnosis not present

## 2022-03-13 DIAGNOSIS — E119 Type 2 diabetes mellitus without complications: Secondary | ICD-10-CM | POA: Diagnosis not present

## 2022-03-13 DIAGNOSIS — K219 Gastro-esophageal reflux disease without esophagitis: Secondary | ICD-10-CM | POA: Diagnosis not present

## 2022-03-19 DIAGNOSIS — R5382 Chronic fatigue, unspecified: Secondary | ICD-10-CM | POA: Diagnosis not present

## 2022-03-19 DIAGNOSIS — R52 Pain, unspecified: Secondary | ICD-10-CM | POA: Diagnosis not present

## 2022-03-19 DIAGNOSIS — R61 Generalized hyperhidrosis: Secondary | ICD-10-CM | POA: Diagnosis not present

## 2022-04-01 ENCOUNTER — Other Ambulatory Visit (HOSPITAL_COMMUNITY): Payer: Self-pay | Admitting: Gastroenterology

## 2022-04-01 DIAGNOSIS — R1084 Generalized abdominal pain: Secondary | ICD-10-CM

## 2022-04-01 DIAGNOSIS — K219 Gastro-esophageal reflux disease without esophagitis: Secondary | ICD-10-CM | POA: Diagnosis not present

## 2022-04-01 DIAGNOSIS — R110 Nausea: Secondary | ICD-10-CM | POA: Diagnosis not present

## 2022-04-07 ENCOUNTER — Ambulatory Visit (HOSPITAL_COMMUNITY)
Admission: RE | Admit: 2022-04-07 | Discharge: 2022-04-07 | Disposition: A | Discharge status: Home / Self Care | Payer: Medicare Other | Source: Ambulatory Visit | Attending: Gastroenterology | Admitting: Gastroenterology

## 2022-04-07 DIAGNOSIS — R110 Nausea: Secondary | ICD-10-CM | POA: Insufficient documentation

## 2022-04-07 DIAGNOSIS — R109 Unspecified abdominal pain: Secondary | ICD-10-CM | POA: Diagnosis not present

## 2022-04-07 DIAGNOSIS — I700 Atherosclerosis of aorta: Secondary | ICD-10-CM | POA: Diagnosis not present

## 2022-04-07 DIAGNOSIS — R1084 Generalized abdominal pain: Secondary | ICD-10-CM | POA: Insufficient documentation

## 2022-04-07 MED ORDER — BARIUM SULFATE 2 % PO SUSP: 900.0000 mL | Freq: Once | ORAL | Status: DC

## 2022-04-09 DIAGNOSIS — R300 Dysuria: Secondary | ICD-10-CM | POA: Diagnosis not present

## 2022-04-09 DIAGNOSIS — R509 Fever, unspecified: Secondary | ICD-10-CM | POA: Diagnosis not present

## 2022-04-13 DIAGNOSIS — D225 Melanocytic nevi of trunk: Secondary | ICD-10-CM | POA: Diagnosis not present

## 2022-04-13 DIAGNOSIS — L821 Other seborrheic keratosis: Secondary | ICD-10-CM | POA: Diagnosis not present

## 2022-04-13 DIAGNOSIS — L218 Other seborrheic dermatitis: Secondary | ICD-10-CM | POA: Diagnosis not present

## 2022-04-13 DIAGNOSIS — L814 Other melanin hyperpigmentation: Secondary | ICD-10-CM | POA: Diagnosis not present

## 2022-04-14 DIAGNOSIS — E119 Type 2 diabetes mellitus without complications: Secondary | ICD-10-CM | POA: Diagnosis not present

## 2022-04-14 DIAGNOSIS — E782 Mixed hyperlipidemia: Secondary | ICD-10-CM | POA: Diagnosis not present

## 2022-04-14 DIAGNOSIS — I10 Essential (primary) hypertension: Secondary | ICD-10-CM | POA: Diagnosis not present

## 2022-04-22 DIAGNOSIS — K317 Polyp of stomach and duodenum: Secondary | ICD-10-CM | POA: Diagnosis not present

## 2022-04-22 DIAGNOSIS — K2930 Chronic superficial gastritis without bleeding: Secondary | ICD-10-CM | POA: Diagnosis not present

## 2022-04-22 DIAGNOSIS — R1084 Generalized abdominal pain: Secondary | ICD-10-CM | POA: Diagnosis not present

## 2022-04-22 DIAGNOSIS — K2970 Gastritis, unspecified, without bleeding: Secondary | ICD-10-CM | POA: Diagnosis not present

## 2022-04-22 DIAGNOSIS — K449 Diaphragmatic hernia without obstruction or gangrene: Secondary | ICD-10-CM | POA: Diagnosis not present

## 2022-04-22 DIAGNOSIS — R110 Nausea: Secondary | ICD-10-CM | POA: Diagnosis not present

## 2022-04-22 DIAGNOSIS — K219 Gastro-esophageal reflux disease without esophagitis: Secondary | ICD-10-CM | POA: Diagnosis not present

## 2022-04-22 DIAGNOSIS — Z9889 Other specified postprocedural states: Secondary | ICD-10-CM | POA: Diagnosis not present

## 2022-05-05 DIAGNOSIS — K2930 Chronic superficial gastritis without bleeding: Secondary | ICD-10-CM | POA: Diagnosis not present

## 2022-05-06 DIAGNOSIS — G629 Polyneuropathy, unspecified: Secondary | ICD-10-CM | POA: Diagnosis not present

## 2022-05-28 DIAGNOSIS — L218 Other seborrheic dermatitis: Secondary | ICD-10-CM | POA: Diagnosis not present

## 2022-05-28 DIAGNOSIS — L570 Actinic keratosis: Secondary | ICD-10-CM | POA: Diagnosis not present

## 2022-06-11 IMAGING — CT CT NECK W/O CM
2 of 4 series · 5 of 14 positions shown, 6 images · non-contrast
Comparison: Ultrasound of the neck soft tissues 07/31/2021.

CLINICAL DATA: Provided history: Enlarged lymph node in neck.
History of thyroid cancer. Additional history provided by scanning
technologist: Enlarged lymph node left side of neck under mandible,
sore to touch, low grade fever, symptoms began 2 days ago, patient
began antibiotics yesterday. History of thyroid cancer with
thyroidectomy. Prior tonsillectomy.

EXAM:
CT NECK WITHOUT CONTRAST
TECHNIQUE: Multidetector CT imaging of the neck was performed following the
standard protocol without intravenous contrast.
RADIATION DOSE REDUCTION: This exam was performed according to the
departmental dose-optimization program which includes automated
exposure control, adjustment of the mA and/or kV according to
patient size and/or use of iterative reconstruction technique.

[Series 3: neck · axial · 0.47mm/px · z∈[+769,+847]mm · 2 of 118 slices shown]
[im 40/118  bone]
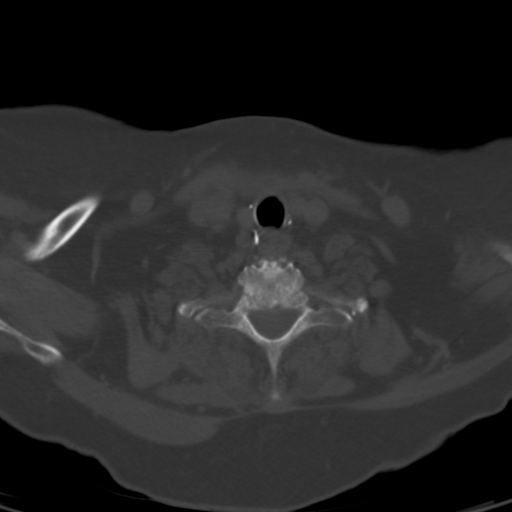
[im 79/118  bone]
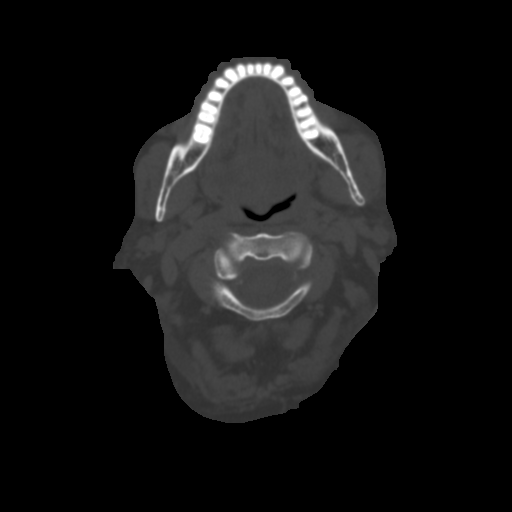

[Series 8: angled axial-oropharynx · axial · 0.39mm/px · z∈[+715,+837]mm · 3 of 128 slices shown, 4 images]
[im 32/128  soft-tissue]
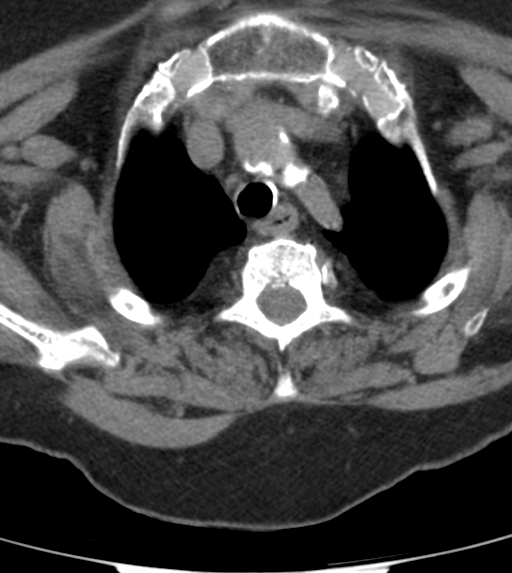
[im 32/128  bone]
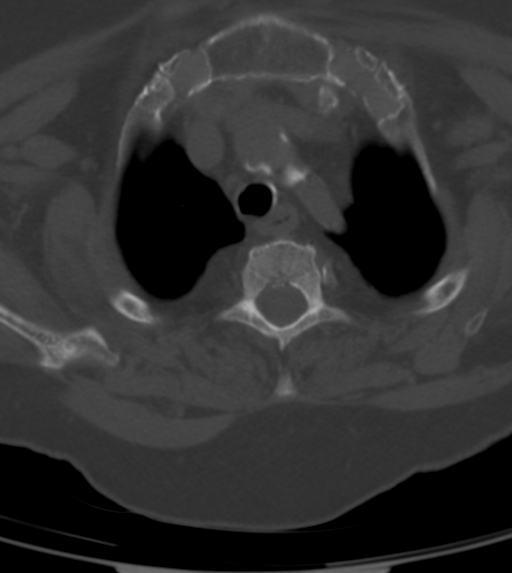
[im 64/128  bone]
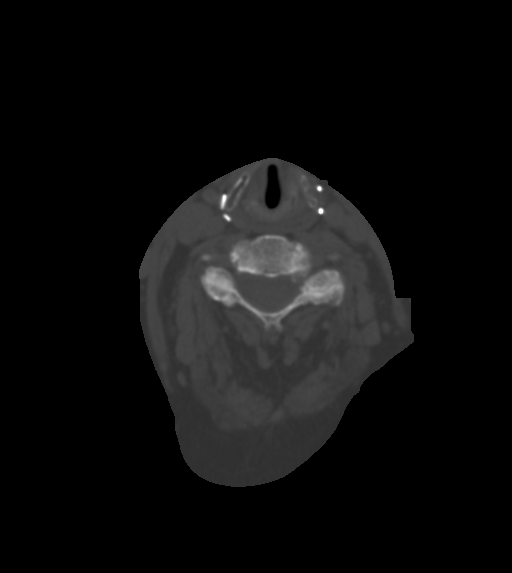
[im 96/128  bone]
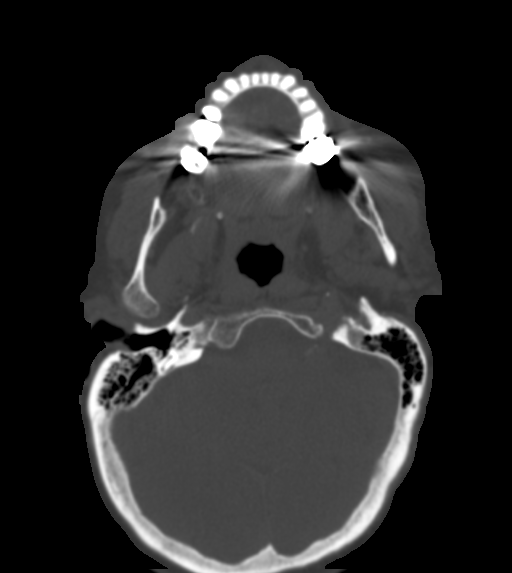

[5 of 14 positions shown; findings below may reference images not displayed]

FINDINGS: Pharynx and larynx: Streak and beam hardening artifact arising from
dental restoration partially obscures the oral cavity. No
appreciable swelling or discrete mass within the oral cavity,
pharynx or larynx.

Salivary glands: Fatty atrophy of the bilateral parotid glands.
Asymmetric prominence of the left submandibular gland with fairly
prominent surrounding inflammatory stranding and edema. Within the
limitations of a non-contrast study, there is no appreciable mass
within the left submandibular gland. Streak and beam hardening
artifact arising from dental restoration partially obscures the oral
cavity. Within this limitation, there is no appreciable obstructing
salivary stone.

Thyroid: Prior thyroidectomy.

Lymph nodes: There is a mildly prominent lymph node along the
anterolateral aspect of the left submandibular gland, measuring 9 mm
in short axis, likely reactive. No pathologically enlarged lymph
nodes identified elsewhere within the neck.

Vascular: Limited evaluation of the major vascular structures of the
neck in the absence of intravenous contrast. Atherosclerotic plaque
within the visualized aortic arch, proximal major branch vessels of
the neck and carotid arteries.

Limited intracranial: No evidence of acute intracranial abnormality
within the field of view. Incompletely assessed cerebral atrophy.

Visualized orbits: Incompletely imaged. No orbital mass or acute
orbital finding at the imaged levels.

Mastoids and visualized paranasal sinuses: No significant paranasal
sinus disease at the imaged levels. No significant mastoid effusion.

Skeleton: Straightening of the expected cervical lordosis. Mild
multilevel grade 1 spondylolisthesis within the cervical and upper
thoracic spine, greatest at T3-T4. Cervical and upper thoracic
spondylosis with multilevel disc space narrowing, disc bulges,
posterior disc osteophytes, endplate spurring, uncovertebral
hypertrophy and facet arthrosis. Disc space narrowing is advanced
throughout the cervical and visualized upper thoracic spine. No
appreciable high-grade spinal canal stenosis. Multilevel bony neural
foraminal narrowing.

Upper chest: No consolidation within the imaged lung apices.

Impression #1 will be called to the ordering clinician or
representative by the Radiologist Assistant, and communication
documented in the PACS or [REDACTED].
IMPRESSION: Asymmetric prominence of the left submandibular gland with fairly
prominent surrounding inflammatory stranding and edema. This
constellation of findings is most compatible with left submandibular
sialoadenitis. Streak and beam hardening artifact arising from
dental restoration partially obscures the oral cavity. Within this
limitation, there is no appreciable obstructing salivary stone.

Asymmetrically prominent lymph node along the anterolateral aspect
of the left submandibular gland, measuring 9 mm in short axis,
likely reactive.

Fatty atrophy of the bilateral parotid glands.

Prior thyroidectomy.

Incompletely assessed cerebral atrophy.

Cervical spondylosis, as described.

Aortic Atherosclerosis ([id number].A).

## 2022-06-16 DIAGNOSIS — H1045 Other chronic allergic conjunctivitis: Secondary | ICD-10-CM | POA: Diagnosis not present

## 2022-06-16 DIAGNOSIS — J301 Allergic rhinitis due to pollen: Secondary | ICD-10-CM | POA: Diagnosis not present

## 2022-06-16 DIAGNOSIS — J4530 Mild persistent asthma, uncomplicated: Secondary | ICD-10-CM | POA: Diagnosis not present

## 2022-06-16 DIAGNOSIS — J3089 Other allergic rhinitis: Secondary | ICD-10-CM | POA: Diagnosis not present

## 2022-06-25 DIAGNOSIS — I10 Essential (primary) hypertension: Secondary | ICD-10-CM | POA: Diagnosis not present

## 2022-06-25 DIAGNOSIS — K219 Gastro-esophageal reflux disease without esophagitis: Secondary | ICD-10-CM | POA: Diagnosis not present

## 2022-06-25 DIAGNOSIS — E119 Type 2 diabetes mellitus without complications: Secondary | ICD-10-CM | POA: Diagnosis not present

## 2022-06-25 DIAGNOSIS — E890 Postprocedural hypothyroidism: Secondary | ICD-10-CM | POA: Diagnosis not present

## 2022-07-06 DIAGNOSIS — H6122 Impacted cerumen, left ear: Secondary | ICD-10-CM | POA: Diagnosis not present

## 2022-07-08 DIAGNOSIS — R1013 Epigastric pain: Secondary | ICD-10-CM | POA: Diagnosis not present

## 2022-07-08 DIAGNOSIS — K529 Noninfective gastroenteritis and colitis, unspecified: Secondary | ICD-10-CM | POA: Diagnosis not present

## 2022-07-08 DIAGNOSIS — E538 Deficiency of other specified B group vitamins: Secondary | ICD-10-CM | POA: Diagnosis not present

## 2022-07-08 DIAGNOSIS — R000 Tachycardia, unspecified: Secondary | ICD-10-CM | POA: Diagnosis not present

## 2022-07-29 DIAGNOSIS — M8580 Other specified disorders of bone density and structure, unspecified site: Secondary | ICD-10-CM | POA: Diagnosis not present

## 2022-07-29 DIAGNOSIS — D509 Iron deficiency anemia, unspecified: Secondary | ICD-10-CM | POA: Diagnosis not present

## 2022-07-29 DIAGNOSIS — I10 Essential (primary) hypertension: Secondary | ICD-10-CM | POA: Diagnosis not present

## 2022-07-29 DIAGNOSIS — Z780 Asymptomatic menopausal state: Secondary | ICD-10-CM | POA: Diagnosis not present

## 2022-07-29 DIAGNOSIS — E1169 Type 2 diabetes mellitus with other specified complication: Secondary | ICD-10-CM | POA: Diagnosis not present

## 2022-07-29 DIAGNOSIS — K219 Gastro-esophageal reflux disease without esophagitis: Secondary | ICD-10-CM | POA: Diagnosis not present

## 2022-07-29 DIAGNOSIS — Z23 Encounter for immunization: Secondary | ICD-10-CM | POA: Diagnosis not present

## 2022-07-29 DIAGNOSIS — Z0000 Encounter for general adult medical examination without abnormal findings: Secondary | ICD-10-CM | POA: Diagnosis not present

## 2022-07-29 DIAGNOSIS — E890 Postprocedural hypothyroidism: Secondary | ICD-10-CM | POA: Diagnosis not present

## 2022-07-29 DIAGNOSIS — E119 Type 2 diabetes mellitus without complications: Secondary | ICD-10-CM | POA: Diagnosis not present

## 2022-07-29 DIAGNOSIS — B372 Candidiasis of skin and nail: Secondary | ICD-10-CM | POA: Diagnosis not present

## 2022-07-29 DIAGNOSIS — E782 Mixed hyperlipidemia: Secondary | ICD-10-CM | POA: Diagnosis not present

## 2022-08-07 DIAGNOSIS — Z780 Asymptomatic menopausal state: Secondary | ICD-10-CM | POA: Diagnosis not present

## 2022-08-07 DIAGNOSIS — M810 Age-related osteoporosis without current pathological fracture: Secondary | ICD-10-CM | POA: Diagnosis not present

## 2022-08-07 DIAGNOSIS — M8589 Other specified disorders of bone density and structure, multiple sites: Secondary | ICD-10-CM | POA: Diagnosis not present

## 2022-08-12 DIAGNOSIS — M810 Age-related osteoporosis without current pathological fracture: Secondary | ICD-10-CM | POA: Diagnosis not present

## 2022-08-14 ENCOUNTER — Other Ambulatory Visit (HOSPITAL_BASED_OUTPATIENT_CLINIC_OR_DEPARTMENT_OTHER): Payer: Self-pay

## 2022-08-14 MED ORDER — AREXVY 120 MCG/0.5ML IM SUSR
INTRAMUSCULAR | 0 refills | Status: DC
  Filled 2022-08-14: qty 0.5, 1d supply, fill #0

## 2022-08-17 ENCOUNTER — Other Ambulatory Visit (HOSPITAL_BASED_OUTPATIENT_CLINIC_OR_DEPARTMENT_OTHER): Payer: Self-pay

## 2022-09-02 ENCOUNTER — Encounter: Payer: Self-pay | Admitting: Pulmonary Disease

## 2022-09-02 ENCOUNTER — Institutional Professional Consult (permissible substitution): Payer: Medicare Other | Admitting: Pulmonary Disease

## 2022-09-09 DIAGNOSIS — M810 Age-related osteoporosis without current pathological fracture: Secondary | ICD-10-CM | POA: Diagnosis not present

## 2022-09-16 DIAGNOSIS — Z1231 Encounter for screening mammogram for malignant neoplasm of breast: Secondary | ICD-10-CM | POA: Diagnosis not present

## 2022-09-29 DIAGNOSIS — H1031 Unspecified acute conjunctivitis, right eye: Secondary | ICD-10-CM | POA: Diagnosis not present

## 2022-10-06 ENCOUNTER — Ambulatory Visit: Payer: Medicare Other | Admitting: Pulmonary Disease

## 2022-10-06 ENCOUNTER — Encounter: Payer: Self-pay | Admitting: Pulmonary Disease

## 2022-10-06 ENCOUNTER — Ambulatory Visit (INDEPENDENT_AMBULATORY_CARE_PROVIDER_SITE_OTHER): Payer: Medicare Other

## 2022-10-06 VITALS — BP 126/82 | HR 92 | Ht 60.0 in | Wt 170.0 lb

## 2022-10-06 DIAGNOSIS — J4520 Mild intermittent asthma, uncomplicated: Secondary | ICD-10-CM

## 2022-10-06 DIAGNOSIS — Z8679 Personal history of other diseases of the circulatory system: Secondary | ICD-10-CM

## 2022-10-06 DIAGNOSIS — R0602 Shortness of breath: Secondary | ICD-10-CM | POA: Diagnosis not present

## 2022-10-06 DIAGNOSIS — R000 Tachycardia, unspecified: Secondary | ICD-10-CM | POA: Diagnosis not present

## 2022-10-06 DIAGNOSIS — J302 Other seasonal allergic rhinitis: Secondary | ICD-10-CM | POA: Diagnosis not present

## 2022-10-06 NOTE — Patient Instructions (Signed)
We will check a chest x-ray today  We will schedule you for pulmonary function tests at the earliest date/time available  Continue albuterol inhaler as needed  Follow up in 2 months to review test results

## 2022-10-06 NOTE — Progress Notes (Signed)
Synopsis: Referred in Juney 2024 for asthma by Eliane Decree, PA  Subjective:   PATIENT ID: Annette Gross GENDER: female DOB: July 07, 1940, MRN: [id number]  HPI  Chief Complaint  Patient presents with   Consult    Per patient, for increased heart rate and SOB. She is more concerned about the heart rate. States she has not had an asthma attack in almost 20 years.    Annette Gross is an 82 year old woman, never smoker with history of DMII, GERD, hypertension, factor V deficiency and thyroidectomy who is referred to pulmonary clinic for asthma.   She reports being diagnosed with asthma in childhood. She has not had issues with her breathing over recent years. She has albuterol inhaler which she uses seldomly. She denies wheezing or cough. No mucous production. She has seasonal allergies. She has sinus congestion and drainage, using Flonase. She is taking xyzal for allergies.  Her primary care team is concerned about possible lung issues leading to elevated heart rate. Her HR has been in the 120s on assessment of vital signs in their office. She reports her vitals were taken soon after walking in the building. She denies chest pains or palpitations. She does have exertional dyspnea but she says she is out of shape. No dyspnea with bathing or dressing herself. No dyspnea with cooking.   No history of smoking. She reports second hand smoke for her parents and her husband. Maternal Aunt may have died from pulmonary fibrosis. No occupational dust or chemical exposures. She has 2 cats at home.   Past Medical History:  Diagnosis Date   Anemia    many yrs ago   Anxiety    Asthma    no recent issues   Cancer [facility])    s/p thyroidectomy 2018   Complication of anesthesia    Cystitis, interstitial    Degenerative disc disease, lumbar    Degenerative joint disease    spinal stenosis "Chronic low back pain"   Depression    Diabetes mellitus    diet control.   Diverticulitis 2005   Diverticulosis  2005   Factor V deficiency (HCC)    Fibromyalgia    Fracture of right foot 1995   GERD (gastroesophageal reflux disease)    History of hiatal hernia    Hyperlipidemia    Hypertension    off med x 5 yrs.   IBS (irritable bowel syndrome)    Insomnia    Iron deficiency    Liver hemangioma 1999   MRI   Mononucleosis    Morton's neuroma    Left foot   Nuclear sclerotic cataract of left eye 08/29/2019   Peripheral neuropathy    Pneumonia    years 89 & 89.  None since   PONV (postoperative nausea and vomiting)    nausea no vomiting   Rectocele    Transfusion history    child x2, many yrs ago after childbirth"hemorrhage"   Umbilical hernia 2012     Family History  Problem Relation Age of Onset   Stroke Father    Heart disease Father      Social History   Socioeconomic History   Marital status: Married    Spouse name: Not on file   Number of children: Not on file   Years of education: Not on file   Highest education level: Not on file  Occupational History   Not on file  Tobacco Use   Smoking status: Never   Smokeless tobacco: Never  Substance and Sexual Activity   Alcohol use: Yes    Alcohol/week: 2.0 standard drinks of alcohol    Types: 2 Glasses of wine per week    Comment: social   Drug use: No   Sexual activity: Not on file  Other Topics Concern   Not on file  Social History Narrative   Not on file   Social Determinants of Health   Financial Resource Strain: Not on file  Food Insecurity: Not on file  Transportation Needs: Not on file  Physical Activity: Not on file  Stress: Not on file  Social Connections: Not on file  Intimate Partner Violence: Not on file     Allergies  Allergen Reactions   Ciprofloxacin Other (See Comments)    Tendonitis   Iohexol Anaphylaxis   Ivp Dye [Iodinated Contrast Media] Anaphylaxis   Kiwi Extract Anaphylaxis   Gabapentin Other (See Comments)    Auditory hallucinations   Sulfa Antibiotics Hives    Covered legs    Aspirin Other (See Comments)    Stomach pain   Lexapro [Escitalopram Oxalate] Other (See Comments)    Muscle pain   Lipitor [Atorvastatin] Other (See Comments)    Muscle pain   Codeine Other (See Comments)    Severe stomach cramping.   Diphenoxylate-Atropine Other (See Comments)    Severe stomach and dizziness     Outpatient Medications Prior to Visit  Medication Sig Dispense Refill   acetaminophen (TYLENOL) 500 MG tablet Take 1,000 mg by mouth 2 (two) times daily.     albuterol (PROVENTIL HFA;VENTOLIN HFA) 108 (90 Base) MCG/ACT inhaler Inhale 2 puffs into the lungs every 4 (four) hours as needed for wheezing or shortness of breath.     Cholecalciferol (VITAMIN D3) 2000 units TABS Take 4,000 Units by mouth daily.     COVID-19 mRNA bivalent vaccine, Pfizer, (PFIZER COVID-19 VAC BIVALENT) injection Inject into the muscle. 0.3 mL 0   COVID-19 mRNA Vac-TriS, Pfizer, (PFIZER-BIONT COVID-19 VAC-TRIS) SUSP injection Inject into the muscle. 0.3 mL 0   docusate sodium (COLACE) 100 MG capsule Take 100 mg by mouth 2 (two) times daily as needed for constipation.     DULoxetine (CYMBALTA) 30 MG capsule Take 30 mg by mouth daily.  11   EPINEPHrine 0.3 mg/0.3 mL IJ SOAJ injection Inject 0.3 mg into the muscle as needed.     fluticasone (FLONASE) 50 MCG/ACT nasal spray Place 2 sprays into the nose daily.      levocetirizine (XYZAL) 5 MG tablet Take 5 mg by mouth every evening.  3   levothyroxine (SYNTHROID, LEVOTHROID) 25 MCG tablet Take 25 mcg by mouth daily before breakfast.     montelukast (SINGULAIR) 10 MG tablet Take 10 mg by mouth every evening.  1   Multiple Vitamins-Minerals (MULTIVITAMIN ADULT) CHEW Chew 2 each by mouth daily.     omeprazole (PRILOSEC) 40 MG capsule Take 40 mg by mouth daily.       RSV vaccine recomb adjuvanted (AREXVY) 120 MCG/0.5ML injection Inject into the muscle. 0.5 mL 0   amoxicillin-clavulanate (AUGMENTIN) 875-125 MG tablet Take 1 tablet by mouth 2 (two) times daily.  (Patient not taking: Reported on 08/13/2017) 10 tablet 0   calcitRIOL (ROCALTROL) 0.25 MCG capsule Take 1 capsule (0.25 mcg total) by mouth daily. (Patient not taking: Reported on 03/19/2020) 60 capsule 1   calcium carbonate (OS-CAL - DOSED IN MG OF ELEMENTAL CALCIUM) 1250 (500 Ca) MG tablet Take 4 tablets (2,000 mg of elemental calcium total) by mouth  4 (four) times daily. (Patient not taking: Reported on 08/13/2017) 200 tablet 3   carvedilol (COREG) 6.25 MG tablet Take 1 tablet (6.25 mg total) by mouth 2 (two) times daily with a meal. (Patient not taking: Reported on 08/13/2017) 60 tablet 0   feeding supplement (BOOST / RESOURCE BREEZE) LIQD Take 1 Container by mouth 2 (two) times daily between meals. (Patient not taking: Reported on 08/13/2017) 60 Container 0   lisinopril (PRINIVIL,ZESTRIL) 10 MG tablet Take 1 tablet (10 mg total) by mouth daily. (Patient not taking: Reported on 08/13/2017) 30 tablet 0   metFORMIN (GLUCOPHAGE) 500 MG tablet Take 1 tablet (500 mg total) by mouth daily with breakfast. (Patient not taking: Reported on 08/13/2017) 30 tablet 0   Multiple Vitamin (MULTIVITAMIN) tablet Take 1 tablet by mouth daily.   (Patient not taking: Reported on 03/19/2020)     Propylene Glycol (SYSTANE BALANCE OP) Place 1 drop into both eyes daily as needed (dry eyes). (Patient not taking: Reported on 03/19/2020)     No facility-administered medications prior to visit.    Review of Systems  Constitutional:  Negative for chills, fever, malaise/fatigue and weight loss.  HENT:  Negative for congestion, sinus pain and sore throat.   Eyes: Negative.   Respiratory:  Positive for shortness of breath (with exertion). Negative for cough, hemoptysis, sputum production and wheezing.   Cardiovascular:  Negative for chest pain, palpitations, orthopnea, claudication and leg swelling.  Gastrointestinal:  Negative for abdominal pain, heartburn, nausea and vomiting.  Genitourinary: Negative.   Musculoskeletal:   Negative for joint pain and myalgias.  Skin:  Negative for rash.  Neurological:  Negative for weakness.  Endo/Heme/Allergies:  Positive for environmental allergies.  Psychiatric/Behavioral: Negative.     Objective:   Vitals:   10/06/22 0926  BP: 126/82  Pulse: 92  SpO2: 98%  Weight: 170 lb (77.1 kg)  Height: 5' (1.524 m)    Physical Exam Constitutional:      General: She is not in acute distress.    Appearance: She is not ill-appearing.  HENT:     Head: Normocephalic and atraumatic.  Eyes:     General: No scleral icterus.    Conjunctiva/sclera: Conjunctivae normal.  Cardiovascular:     Rate and Rhythm: Normal rate and regular rhythm.     Pulses: Normal pulses.     Heart sounds: Normal heart sounds. No murmur heard. Pulmonary:     Effort: Pulmonary effort is normal.     Breath sounds: Normal breath sounds. No wheezing, rhonchi or rales.  Musculoskeletal:     Right lower leg: No edema.     Left lower leg: No edema.  Skin:    General: Skin is warm and dry.  Neurological:     General: No focal deficit present.     Mental Status: She is alert.     CBC    Component Value Date/Time   WBC 9.5 07/09/2016 0415   RBC 4.13 07/09/2016 0415   HGB 12.9 07/09/2016 0415   HCT 39.3 07/09/2016 0415   PLT 317 07/09/2016 0415   MCV 95.2 07/09/2016 0415   MCH 31.2 07/09/2016 0415   MCHC 32.8 07/09/2016 0415   RDW 13.1 07/09/2016 0415   LYMPHSABS 3.5 06/29/2016 1151   MONOABS 1.0 06/29/2016 1151   EOSABS 0.2 06/29/2016 1151   BASOSABS 0.1 06/29/2016 1151     Chest imaging: CT abdomen 04/07/22 Lower chest: Clear lung bases. No significant pleural or pericardial effusion. Unchanged small hiatal hernia with postsurgical  changes at the gastroesophageal junction. There are calcifications of the aortic valve with atherosclerosis of the aorta and coronary arteries.  PFT:     No data to display          Labs: Hgb 14 CO2 29 Absolute eos 200 Path:  Echo:  Heart  Catheterization:  Assessment & Plan:   Mild intermittent asthma without complication  Seasonal allergies  Hx of sinus tachycardia  Discussion: Zaynah Adelson is an 82 year old woman, never smoker with history of DMII, GERD, hypertension, factor V deficiency and thyroidectomy who is referred to pulmonary clinic for asthma.   Her asthma is well controlled with as needed albuterol. Her lung function is less likely the cause of her tachycardia.   She likely has a degree of deconditioning that is leading to excessive heart rate response with exertion. We will check chest x-ray and PFTs to round out her pulmonary evaluation.  Follow up in 2-3 months to review PFTs.  Melody Comas, MD [facility] Office: [phone]   Current Outpatient Medications:    acetaminophen (TYLENOL) 500 MG tablet, Take 1,000 mg by mouth 2 (two) times daily., Disp: , Rfl:    albuterol (PROVENTIL HFA;VENTOLIN HFA) 108 (90 Base) MCG/ACT inhaler, Inhale 2 puffs into the lungs every 4 (four) hours as needed for wheezing or shortness of breath., Disp: , Rfl:    Cholecalciferol (VITAMIN D3) 2000 units TABS, Take 4,000 Units by mouth daily., Disp: , Rfl:    COVID-19 mRNA bivalent vaccine, Pfizer, (PFIZER COVID-19 VAC BIVALENT) injection, Inject into the muscle., Disp: 0.3 mL, Rfl: 0   COVID-19 mRNA Vac-TriS, Pfizer, (PFIZER-BIONT COVID-19 VAC-TRIS) SUSP injection, Inject into the muscle., Disp: 0.3 mL, Rfl: 0   docusate sodium (COLACE) 100 MG capsule, Take 100 mg by mouth 2 (two) times daily as needed for constipation., Disp: , Rfl:    DULoxetine (CYMBALTA) 30 MG capsule, Take 30 mg by mouth daily., Disp: , Rfl: 11   EPINEPHrine 0.3 mg/0.3 mL IJ SOAJ injection, Inject 0.3 mg into the muscle as needed., Disp: , Rfl:    fluticasone (FLONASE) 50 MCG/ACT nasal spray, Place 2 sprays into the nose daily. , Disp: , Rfl:    levocetirizine (XYZAL) 5 MG tablet, Take 5 mg by mouth every evening., Disp: , Rfl:  3   levothyroxine (SYNTHROID, LEVOTHROID) 25 MCG tablet, Take 25 mcg by mouth daily before breakfast., Disp: , Rfl:    montelukast (SINGULAIR) 10 MG tablet, Take 10 mg by mouth every evening., Disp: , Rfl: 1   Multiple Vitamins-Minerals (MULTIVITAMIN ADULT) CHEW, Chew 2 each by mouth daily., Disp: , Rfl:    omeprazole (PRILOSEC) 40 MG capsule, Take 40 mg by mouth daily.  , Disp: , Rfl:    RSV vaccine recomb adjuvanted (AREXVY) 120 MCG/0.5ML injection, Inject into the muscle., Disp: 0.5 mL, Rfl: 0

## 2022-10-09 DIAGNOSIS — H1031 Unspecified acute conjunctivitis, right eye: Secondary | ICD-10-CM | POA: Diagnosis not present

## 2022-10-14 ENCOUNTER — Encounter (INDEPENDENT_AMBULATORY_CARE_PROVIDER_SITE_OTHER): Payer: Medicare Other | Admitting: Ophthalmology

## 2022-11-13 DIAGNOSIS — N3010 Interstitial cystitis (chronic) without hematuria: Secondary | ICD-10-CM | POA: Diagnosis not present

## 2022-11-13 DIAGNOSIS — N3281 Overactive bladder: Secondary | ICD-10-CM | POA: Diagnosis not present

## 2022-12-14 ENCOUNTER — Encounter: Payer: Self-pay | Admitting: Pulmonary Disease

## 2022-12-14 ENCOUNTER — Ambulatory Visit: Payer: Medicare Other | Admitting: Pulmonary Disease

## 2022-12-14 VITALS — BP 138/80 | HR 104 | Temp 97.8°F | Ht 59.5 in | Wt 169.0 lb

## 2022-12-14 DIAGNOSIS — J4520 Mild intermittent asthma, uncomplicated: Secondary | ICD-10-CM | POA: Diagnosis not present

## 2022-12-14 DIAGNOSIS — J302 Other seasonal allergic rhinitis: Secondary | ICD-10-CM | POA: Diagnosis not present

## 2022-12-14 NOTE — Progress Notes (Signed)
Synopsis: Referred in Juney 2024 for asthma by Eliane Decree, PA  Subjective:   PATIENT ID: Annette Gross: female DOB: 06/05/1940, MRN: [id number]  HPI  Chief Complaint  Patient presents with   Follow-up    Doing well   Annette Gross is an 82 year old woman, never smoker with history of DMII, GERD, hypertension, factor V deficiency and thyroidectomy who returns to pulmonary clinic for asthma.   She has been doing well since last visit. Has rarely used her albuterol inhaler. She is using montelukast 10mg  daily and flonase nasal spray.   Initial OV 10/06/22 She reports being diagnosed with asthma in childhood. She has not had issues with her breathing over recent years. She has albuterol inhaler which she uses seldomly. She denies wheezing or cough. No mucous production. She has seasonal allergies. She has sinus congestion and drainage, using Flonase. She is taking xyzal for allergies.  Her primary care team is concerned about possible lung issues leading to elevated heart rate. Her HR has been in the 120s on assessment of vital signs in their office. She reports her vitals were taken soon after walking in the building. She denies chest pains or palpitations. She does have exertional dyspnea but she says she is out of shape. No dyspnea with bathing or dressing herself. No dyspnea with cooking.   No history of smoking. She reports second hand smoke for her parents and her husband. Maternal Aunt may have died from pulmonary fibrosis. No occupational dust or chemical exposures. She has 2 cats at home.   Past Medical History:  Diagnosis Date   Anemia    many yrs ago   Anxiety    Asthma    no recent issues   Cancer [facility])    s/p thyroidectomy 2018   Complication of anesthesia    Cystitis, interstitial    Degenerative disc disease, lumbar    Degenerative joint disease    spinal stenosis "Chronic low back pain"   Depression    Diabetes mellitus    diet control.   Diverticulitis  2005   Diverticulosis 2005   Factor V deficiency (HCC)    Fibromyalgia    Fracture of right foot 1995   GERD (gastroesophageal reflux disease)    History of hiatal hernia    Hyperlipidemia    Hypertension    off med x 5 yrs.   IBS (irritable bowel syndrome)    Insomnia    Iron deficiency    Liver hemangioma 1999   MRI   Mononucleosis    Morton's neuroma    Left foot   Nuclear sclerotic cataract of left eye 08/29/2019   Peripheral neuropathy    Pneumonia    years 89 & 89.  None since   PONV (postoperative nausea and vomiting)    nausea no vomiting   Rectocele    Transfusion history    child x2, many yrs ago after childbirth"hemorrhage"   Umbilical hernia 2012     Family History  Problem Relation Age of Onset   Stroke Father    Heart disease Father      Social History   Socioeconomic History   Marital status: Married    Spouse name: Not on file   Number of children: Not on file   Years of education: Not on file   Highest education level: Not on file  Occupational History   Not on file  Tobacco Use   Smoking status: Never   Smokeless tobacco: Never  Substance and Sexual Activity   Alcohol use: Yes    Alcohol/week: 2.0 standard drinks of alcohol    Types: 2 Glasses of wine per week    Comment: social   Drug use: No   Sexual activity: Not on file  Other Topics Concern   Not on file  Social History Narrative   Not on file   Social Determinants of Health   Financial Resource Strain: Not on file  Food Insecurity: Not on file  Transportation Needs: Not on file  Physical Activity: Not on file  Stress: Not on file  Social Connections: Not on file  Intimate Partner Violence: Not on file     Allergies  Allergen Reactions   Ciprofloxacin Other (See Comments)    Tendonitis   Iohexol Anaphylaxis   Ivp Dye [Iodinated Contrast Media] Anaphylaxis   Kiwi Extract Anaphylaxis   Gabapentin Other (See Comments)    Auditory hallucinations   Sulfa Antibiotics  Hives    Covered legs   Aspirin Other (See Comments)    Stomach pain   Lexapro [Escitalopram Oxalate] Other (See Comments)    Muscle pain   Lipitor [Atorvastatin] Other (See Comments)    Muscle pain   Codeine Other (See Comments)    Severe stomach cramping.   Diphenoxylate-Atropine Other (See Comments)    Severe stomach and dizziness     Outpatient Medications Prior to Visit  Medication Sig Dispense Refill   acetaminophen (TYLENOL) 500 MG tablet Take 1,000 mg by mouth 2 (two) times daily.     albuterol (PROVENTIL HFA;VENTOLIN HFA) 108 (90 Base) MCG/ACT inhaler Inhale 2 puffs into the lungs every 4 (four) hours as needed for wheezing or shortness of breath.     Cholecalciferol (VITAMIN D3) 2000 units TABS Take 4,000 Units by mouth daily.     COVID-19 mRNA bivalent vaccine, Pfizer, (PFIZER COVID-19 VAC BIVALENT) injection Inject into the muscle. 0.3 mL 0   COVID-19 mRNA Vac-TriS, Pfizer, (PFIZER-BIONT COVID-19 VAC-TRIS) SUSP injection Inject into the muscle. 0.3 mL 0   docusate sodium (COLACE) 100 MG capsule Take 100 mg by mouth 2 (two) times daily as needed for constipation.     DULoxetine (CYMBALTA) 30 MG capsule Take 30 mg by mouth daily.  11   EPINEPHrine 0.3 mg/0.3 mL IJ SOAJ injection Inject 0.3 mg into the muscle as needed.     fluticasone (FLONASE) 50 MCG/ACT nasal spray Place 2 sprays into the nose daily.      levocetirizine (XYZAL) 5 MG tablet Take 5 mg by mouth every evening.  3   levothyroxine (SYNTHROID, LEVOTHROID) 25 MCG tablet Take 25 mcg by mouth daily before breakfast.     montelukast (SINGULAIR) 10 MG tablet Take 10 mg by mouth every evening.  1   Multiple Vitamins-Minerals (MULTIVITAMIN ADULT) CHEW Chew 2 each by mouth daily.     omeprazole (PRILOSEC) 40 MG capsule Take 40 mg by mouth daily.       RSV vaccine recomb adjuvanted (AREXVY) 120 MCG/0.5ML injection Inject into the muscle. 0.5 mL 0   No facility-administered medications prior to visit.    Review of  Systems  Constitutional:  Negative for chills, fever, malaise/fatigue and weight loss.  HENT:  Negative for congestion, sinus pain and sore throat.   Eyes: Negative.   Respiratory:  Positive for shortness of breath (with exertion). Negative for cough, hemoptysis, sputum production and wheezing.   Cardiovascular:  Negative for chest pain, palpitations, orthopnea, claudication and leg swelling.  Gastrointestinal:  Negative for  abdominal pain, heartburn, nausea and vomiting.  Genitourinary: Negative.   Musculoskeletal:  Negative for joint pain and myalgias.  Skin:  Negative for rash.  Neurological:  Negative for weakness.  Endo/Heme/Allergies:  Positive for environmental allergies.  Psychiatric/Behavioral: Negative.     Objective:   Vitals:   12/14/22 0912  BP: 138/80  Pulse: (!) 104  Temp: 97.8 F (36.6 C)  TempSrc: Oral  SpO2: 95%  Weight: 169 lb (76.7 kg)  Height: 4' 11.5" (1.511 m)     Physical Exam Constitutional:      General: She is not in acute distress.    Appearance: She is not ill-appearing.  HENT:     Head: Normocephalic and atraumatic.  Eyes:     General: No scleral icterus. Cardiovascular:     Rate and Rhythm: Normal rate and regular rhythm.     Pulses: Normal pulses.     Heart sounds: Normal heart sounds. No murmur heard. Pulmonary:     Effort: Pulmonary effort is normal.     Breath sounds: Normal breath sounds. No wheezing, rhonchi or rales.  Musculoskeletal:     Right lower leg: No edema.     Left lower leg: No edema.  Skin:    General: Skin is warm and dry.  Neurological:     Mental Status: She is alert.     CBC    Component Value Date/Time   WBC 9.5 07/09/2016 0415   RBC 4.13 07/09/2016 0415   HGB 12.9 07/09/2016 0415   HCT 39.3 07/09/2016 0415   PLT 317 07/09/2016 0415   MCV 95.2 07/09/2016 0415   MCH 31.2 07/09/2016 0415   MCHC 32.8 07/09/2016 0415   RDW 13.1 07/09/2016 0415   LYMPHSABS 3.5 06/29/2016 1151   MONOABS 1.0 06/29/2016  1151   EOSABS 0.2 06/29/2016 1151   BASOSABS 0.1 06/29/2016 1151     Chest imaging: CXR 10/06/22 The cardiomediastinal silhouette is unchanged in contour.Tortuous thoracic aorta. Atherosclerotic calcifications. No pleural effusion. No pneumothorax. No acute pleuroparenchymal abnormality. Visualized abdomen is unremarkable. Multilevel degenerative changes of the thoracic spine.  CT abdomen 04/07/22 Lower chest: Clear lung bases. No significant pleural or pericardial effusion. Unchanged small hiatal hernia with postsurgical changes at the gastroesophageal junction. There are calcifications of the aortic valve with atherosclerosis of the aorta and coronary arteries.  PFT:     No data to display          Labs: Hgb 14 CO2 29 Absolute eos 200 Path:  Echo:  Heart Catheterization:  Assessment & Plan:   Mild intermittent asthma without complication  Seasonal allergies  Discussion: Annette Gross is an 82 year old woman, never smoker with history of DMII, GERD, hypertension, factor V deficiency and thyroidectomy who returns to pulmonary clinic for asthma.   Her asthma is well controlled with as needed albuterol. Her lung function is less likely the cause of her tachycardia.   She likely has a degree of deconditioning that is leading to excessive heart rate response with exertion. If on going concern for tachycardia recommend obtaining EKG, TSH level and Echocardiogram.  Follow up in 1 year, call sooner if needed.  Melody Comas, MD [facility] Office: [phone]   Current Outpatient Medications:    acetaminophen (TYLENOL) 500 MG tablet, Take 1,000 mg by mouth 2 (two) times daily., Disp: , Rfl:    albuterol (PROVENTIL HFA;VENTOLIN HFA) 108 (90 Base) MCG/ACT inhaler, Inhale 2 puffs into the lungs every 4 (four) hours as needed  for wheezing or shortness of breath., Disp: , Rfl:    Cholecalciferol (VITAMIN D3) 2000 units TABS, Take 4,000 Units by  mouth daily., Disp: , Rfl:    COVID-19 mRNA bivalent vaccine, Pfizer, (PFIZER COVID-19 VAC BIVALENT) injection, Inject into the muscle., Disp: 0.3 mL, Rfl: 0   COVID-19 mRNA Vac-TriS, Pfizer, (PFIZER-BIONT COVID-19 VAC-TRIS) SUSP injection, Inject into the muscle., Disp: 0.3 mL, Rfl: 0   docusate sodium (COLACE) 100 MG capsule, Take 100 mg by mouth 2 (two) times daily as needed for constipation., Disp: , Rfl:    DULoxetine (CYMBALTA) 30 MG capsule, Take 30 mg by mouth daily., Disp: , Rfl: 11   EPINEPHrine 0.3 mg/0.3 mL IJ SOAJ injection, Inject 0.3 mg into the muscle as needed., Disp: , Rfl:    fluticasone (FLONASE) 50 MCG/ACT nasal spray, Place 2 sprays into the nose daily. , Disp: , Rfl:    levocetirizine (XYZAL) 5 MG tablet, Take 5 mg by mouth every evening., Disp: , Rfl: 3   levothyroxine (SYNTHROID, LEVOTHROID) 25 MCG tablet, Take 25 mcg by mouth daily before breakfast., Disp: , Rfl:    montelukast (SINGULAIR) 10 MG tablet, Take 10 mg by mouth every evening., Disp: , Rfl: 1   Multiple Vitamins-Minerals (MULTIVITAMIN ADULT) CHEW, Chew 2 each by mouth daily., Disp: , Rfl:    omeprazole (PRILOSEC) 40 MG capsule, Take 40 mg by mouth daily.  , Disp: , Rfl:    RSV vaccine recomb adjuvanted (AREXVY) 120 MCG/0.5ML injection, Inject into the muscle., Disp: 0.5 mL, Rfl: 0

## 2022-12-14 NOTE — Patient Instructions (Addendum)
Continue albuterol 1-2 puffs every 4-6 hours as needed  Continue montelukast 10mg  daily  Continue flonase nasal spray  Recommend having your rapid heart rate evaluated by cardiology  Follow up in 1 year, call sooner if needed

## 2023-01-29 ENCOUNTER — Emergency Department (HOSPITAL_BASED_OUTPATIENT_CLINIC_OR_DEPARTMENT_OTHER)
Admission: EM | Admit: 2023-01-29 | Discharge: 2023-01-30 | Disposition: A | Discharge status: Home / Self Care | Payer: Medicare Other | Attending: Emergency Medicine | Admitting: Emergency Medicine

## 2023-01-29 ENCOUNTER — Other Ambulatory Visit: Payer: Self-pay

## 2023-01-29 ENCOUNTER — Encounter (HOSPITAL_BASED_OUTPATIENT_CLINIC_OR_DEPARTMENT_OTHER): Payer: Self-pay

## 2023-01-29 DIAGNOSIS — K5732 Diverticulitis of large intestine without perforation or abscess without bleeding: Secondary | ICD-10-CM | POA: Insufficient documentation

## 2023-01-29 DIAGNOSIS — Z85850 Personal history of malignant neoplasm of thyroid: Secondary | ICD-10-CM | POA: Insufficient documentation

## 2023-01-29 DIAGNOSIS — Z79899 Other long term (current) drug therapy: Secondary | ICD-10-CM | POA: Insufficient documentation

## 2023-01-29 DIAGNOSIS — J45909 Unspecified asthma, uncomplicated: Secondary | ICD-10-CM | POA: Diagnosis not present

## 2023-01-29 DIAGNOSIS — K5792 Diverticulitis of intestine, part unspecified, without perforation or abscess without bleeding: Secondary | ICD-10-CM

## 2023-01-29 DIAGNOSIS — E1142 Type 2 diabetes mellitus with diabetic polyneuropathy: Secondary | ICD-10-CM | POA: Diagnosis not present

## 2023-01-29 DIAGNOSIS — I10 Essential (primary) hypertension: Secondary | ICD-10-CM | POA: Diagnosis not present

## 2023-01-29 DIAGNOSIS — R1032 Left lower quadrant pain: Secondary | ICD-10-CM | POA: Diagnosis present

## 2023-01-29 LAB — COMPREHENSIVE METABOLIC PANEL
ALT: 9 U/L (ref 0–44)
AST: 10 U/L — ABNORMAL LOW (ref 15–41)
Albumin: 3.8 g/dL (ref 3.5–5.0)
Alkaline Phosphatase: 83 U/L (ref 38–126)
Anion gap: 12 (ref 5–15)
BUN: 12 mg/dL (ref 8–23)
CO2: 23 mmol/L (ref 22–32)
Calcium: 8.6 mg/dL — ABNORMAL LOW (ref 8.9–10.3)
Chloride: 94 mmol/L — ABNORMAL LOW (ref 98–111)
Creatinine, Ser: 0.69 mg/dL (ref 0.44–1.00)
GFR, Estimated: >60 mL/min (ref >60)
Glucose, Bld: 180 mg/dL — ABNORMAL HIGH (ref 70–99)
Potassium: 4.2 mmol/L (ref 3.5–5.1)
Sodium: 129 mmol/L — ABNORMAL LOW (ref 135–145)
Total Bilirubin: 1 mg/dL (ref 0.3–1.2)
Total Protein: 6.9 g/dL (ref 6.5–8.1)

## 2023-01-29 LAB — CBC
HCT: 42.8 % (ref 36.0–46.0)
Hemoglobin: 14.7 g/dL (ref 12.0–15.0)
MCH: 29.6 pg (ref 26.0–34.0)
MCHC: 34.3 g/dL (ref 30.0–36.0)
MCV: 86.3 fL (ref 80.0–100.0)
Platelets: 420 10*3/uL — ABNORMAL HIGH (ref 150–400)
RBC: 4.96 MIL/uL (ref 3.87–5.11)
RDW: 14.5 % (ref 11.5–15.5)
WBC: 15.5 10*3/uL — ABNORMAL HIGH (ref 4.0–10.5)
nRBC: 0 % (ref 0.0–0.2)

## 2023-01-29 LAB — CBG MONITORING, ED: Glucose-Capillary: 216 mg/dL — ABNORMAL HIGH (ref 70–99)

## 2023-01-29 LAB — LIPASE, BLOOD: Lipase: <10 U/L — ABNORMAL LOW (ref 11–51)

## 2023-01-29 LAB — URINALYSIS, ROUTINE W REFLEX MICROSCOPIC
Bilirubin Urine: NEGATIVE
Glucose, UA: NEGATIVE mg/dL
Hgb urine dipstick: NEGATIVE
Ketones, ur: 15 mg/dL — AB
Nitrite: NEGATIVE
Protein, ur: 100 mg/dL — AB
Specific Gravity, Urine: 1.036 — ABNORMAL HIGH (ref 1.005–1.030)
Squamous Epithelial / HPF: >50 /[HPF] (ref 0–5)
pH: 5.5 (ref 5.0–8.0)

## 2023-01-29 MED ORDER — SODIUM CHLORIDE 0.9 % IV BOLUS (SEPSIS)
1000.0000 mL | Freq: Once | INTRAVENOUS | Status: AC
  Administered 2023-01-30: 1000 mL via INTRAVENOUS

## 2023-01-29 MED ORDER — SODIUM CHLORIDE 0.9 % IV SOLN
1000.0000 mL | INTRAVENOUS | Status: DC
  Administered 2023-01-30: 1000 mL via INTRAVENOUS

## 2023-01-29 MED ORDER — FENTANYL CITRATE PF 50 MCG/ML IJ SOSY
25.0000 ug | PREFILLED_SYRINGE | Freq: Once | INTRAMUSCULAR | Status: AC
  Administered 2023-01-30: 25 ug via INTRAVENOUS
  Filled 2023-01-29: qty 1

## 2023-01-29 MED ORDER — ONDANSETRON HCL 4 MG/2ML IJ SOLN
4.0000 mg | Freq: Once | INTRAMUSCULAR | Status: AC
  Administered 2023-01-30: 4 mg via INTRAVENOUS
  Filled 2023-01-29: qty 2

## 2023-01-29 NOTE — ED Provider Notes (Signed)
[place] EMERGENCY DEPARTMENT AT [facility] Provider Note  CSN: [id number] Arrival date & time: 01/29/23 1745  Chief Complaint(s) Abdominal Pain  HPI Annette Gross is a 82 y.o. female {Add pertinent medical, surgical, social history, OB history to HPI:1}    Abdominal Pain Pain location:  LLQ Pain quality: not aching   Pain radiates to:  Does not radiate Pain severity:  Moderate Onset quality:  Gradual Duration:  2 days Timing:  Constant Progression:  Worsening Context: not sick contacts   Relieved by:  Nothing Worsened by:  Movement and palpation Associated symptoms: constipation and nausea   Associated symptoms: no dysuria, no fever and no vomiting    H/o diverticulitis  Past Medical History Past Medical History:  Diagnosis Date   Anemia    many yrs ago   Anxiety    Asthma    no recent issues   Cancer (HCC)    s/p thyroidectomy 2018   Complication of anesthesia    Cystitis, interstitial    Degenerative disc disease, lumbar    Degenerative joint disease    spinal stenosis "Chronic low back pain"   Depression    Diabetes mellitus    diet control.   Diverticulitis 2005   Diverticulosis 2005   Factor V deficiency (HCC)    Fibromyalgia    Fracture of right foot 1995   GERD (gastroesophageal reflux disease)    History of hiatal hernia    Hyperlipidemia    Hypertension    off med x 5 yrs.   IBS (irritable bowel syndrome)    Insomnia    Iron deficiency    Liver hemangioma 1999   MRI   Mononucleosis    Morton's neuroma    Left foot   Nuclear sclerotic cataract of left eye 08/29/2019   Peripheral neuropathy    Pneumonia    years 89 & 89.  None since   PONV (postoperative nausea and vomiting)    nausea no vomiting   Rectocele    Transfusion history    child x2, many yrs ago after childbirth"hemorrhage"   Umbilical hernia 2012   Patient Active Problem List   Diagnosis Date Noted   Pseudophakia, both eyes 10/13/2021   Dermatochalasis of  both lower eyelids 10/13/2021   Moderate nonproliferative diabetic retinopathy of left eye (HCC) 10/10/2020   Moderate nonproliferative diabetic retinopathy of right eye (HCC) 08/29/2019   Early stage nonexudative age-related macular degeneration of both eyes 08/29/2019   Right posterior capsular opacification 08/29/2019   Left thyroid nodule 07/06/2016   Benign neoplasm of skin of upper limb, including shoulder 04/19/2012   Ventral hernia, recurrent 01/26/2011   Home Medication(s) Prior to Admission medications   Medication Sig Start Date End Date Taking? Authorizing Provider  acetaminophen (TYLENOL) 500 MG tablet Take 1,000 mg by mouth 2 (two) times daily.    [provider]  albuterol (PROVENTIL HFA;VENTOLIN HFA) 108 (90 Base) MCG/ACT inhaler Inhale 2 puffs into the lungs every 4 (four) hours as needed for wheezing or shortness of breath.    [provider]  Cholecalciferol (VITAMIN D3) 2000 units TABS Take 4,000 Units by mouth daily.    [provider]  COVID-19 mRNA bivalent vaccine, Pfizer, (PFIZER COVID-19 VAC BIVALENT) injection Inject into the muscle. 04/18/21   Judyann Munson, MD  COVID-19 mRNA Vac-TriS, Pfizer, (PFIZER-BIONT COVID-19 VAC-TRIS) SUSP injection Inject into the muscle. 10/25/20   Judyann Munson, MD  docusate sodium (COLACE) 100 MG capsule Take 100 mg by mouth 2 (  two) times daily as needed for constipation.    [provider]  DULoxetine (CYMBALTA) 30 MG capsule Take 30 mg by mouth daily. 08/09/15   [provider]  EPINEPHrine 0.3 mg/0.3 mL IJ SOAJ injection Inject 0.3 mg into the muscle as needed.    [provider]  fluticasone (FLONASE) 50 MCG/ACT nasal spray Place 2 sprays into the nose daily.     [provider]  levocetirizine (XYZAL) 5 MG tablet Take 5 mg by mouth every evening. 08/09/15   [provider]  levothyroxine (SYNTHROID, LEVOTHROID) 25 MCG tablet Take 25 mcg by mouth daily before  breakfast.    [provider]  montelukast (SINGULAIR) 10 MG tablet Take 10 mg by mouth every evening. 06/07/15   [provider]  Multiple Vitamins-Minerals (MULTIVITAMIN ADULT) CHEW Chew 2 each by mouth daily.    [provider]  omeprazole (PRILOSEC) 40 MG capsule Take 40 mg by mouth daily.      [provider]  RSV vaccine recomb adjuvanted (AREXVY) 120 MCG/0.5ML injection Inject into the muscle. 08/14/22                                                                                                                                       Allergies Ciprofloxacin, Iohexol, Ivp dye [iodinated contrast media], Kiwi extract, Gabapentin, Sulfa antibiotics, Aspirin, Lexapro [escitalopram oxalate], Lipitor [atorvastatin], Codeine, and Diphenoxylate-atropine  Review of Systems Review of Systems  Constitutional:  Negative for fever.  Gastrointestinal:  Positive for abdominal pain, constipation and nausea. Negative for vomiting.  Genitourinary:  Negative for dysuria.   As noted in HPI  Physical Exam Vital Signs  I have reviewed the triage vital signs BP (!) 142/71   Pulse 99   Temp 98.1 F (36.7 C)   Resp 18   Ht 5' (1.524 m)   Wt 75.8 kg   SpO2 97%   BMI 32.61 kg/m   Physical Exam Vitals reviewed.  Constitutional:      General: She is not in acute distress.    Appearance: She is well-developed. She is not diaphoretic.  HENT:     Head: Normocephalic and atraumatic.     Right Ear: External ear normal.     Left Ear: External ear normal.     Nose: Nose normal.  Eyes:     General: No scleral icterus.    Conjunctiva/sclera: Conjunctivae normal.  Neck:     Trachea: Phonation normal.  Cardiovascular:     Rate and Rhythm: Normal rate and regular rhythm.  Pulmonary:     Effort: Pulmonary effort is normal. No respiratory distress.     Breath sounds: No stridor.  Abdominal:     General: There is no distension.     Tenderness: There is abdominal  tenderness in the periumbilical area, left upper quadrant and left lower quadrant.  Musculoskeletal:        General:  Normal range of motion.     Cervical back: Normal range of motion.  Neurological:     Mental Status: She is alert and oriented to person, place, and time.  Psychiatric:        Behavior: Behavior normal.     ED Results and Treatments Labs (all labs ordered are listed, but only abnormal results are displayed) Labs Reviewed  LIPASE, BLOOD - Abnormal; Notable for the following components:      Result Value   Lipase <10 (*)    All other components within normal limits  COMPREHENSIVE METABOLIC PANEL - Abnormal; Notable for the following components:   Sodium 129 (*)    Chloride 94 (*)    Glucose, Bld 180 (*)    Calcium 8.6 (*)    AST 10 (*)    All other components within normal limits  CBC - Abnormal; Notable for the following components:   WBC 15.5 (*)    Platelets 420 (*)    All other components within normal limits  URINALYSIS, ROUTINE W REFLEX MICROSCOPIC - Abnormal; Notable for the following components:   APPearance CLOUDY (*)    Specific Gravity, Urine 1.036 (*)    Ketones, ur 15 (*)    Protein, ur 100 (*)    Leukocytes,Ua MODERATE (*)    Bacteria, UA MANY (*)    Non Squamous Epithelial 0-5 (*)    All other components within normal limits  CBG MONITORING, ED - Abnormal; Notable for the following components:   Glucose-Capillary 216 (*)    All other components within normal limits                                                                                                                         EKG  EKG Interpretation Date/Time:    Ventricular Rate:    PR Interval:    QRS Duration:    QT Interval:    QTC Calculation:   R Axis:      Text Interpretation:         Radiology No results found.  Medications Ordered in ED Medications  sodium chloride 0.9 % bolus 1,000 mL (has no administration in time range)    Followed by  0.9 %  sodium  chloride infusion (has no administration in time range)  fentaNYL (SUBLIMAZE) injection 25 mcg (has no administration in time range)  ondansetron (ZOFRAN) injection 4 mg (has no administration in time range)   Procedures Procedures  (including critical care time) Medical Decision Making / ED Course   Medical Decision Making Amount and/or Complexity of Data Reviewed Labs: ordered.    ***    Final Clinical Impression(s) / ED Diagnoses Final diagnoses:  None    This chart was dictated using voice recognition software.  Despite best efforts to proofread,  errors can occur which can change the documentation meaning.

## 2023-01-29 NOTE — ED Triage Notes (Signed)
Pt presents with LLQ abd pain and constipation. Pt reports associated nausea and generalized weakness. Denies blood in stool. Pt with hx of diverticulitis.

## 2023-01-30 ENCOUNTER — Emergency Department (HOSPITAL_BASED_OUTPATIENT_CLINIC_OR_DEPARTMENT_OTHER): Payer: Medicare Other

## 2023-01-30 MED ORDER — ONDANSETRON 4 MG PO TBDP: 4.0000 mg | ORAL_TABLET | Freq: Three times a day (TID) | ORAL | 0 refills | Status: AC | PRN

## 2023-01-30 MED ORDER — SODIUM CHLORIDE 0.9 % IV SOLN
3.0000 g | Freq: Once | INTRAVENOUS | Status: AC
  Administered 2023-01-30: 3 g via INTRAVENOUS

## 2023-01-30 MED ORDER — ACETAMINOPHEN 500 MG PO TABS
1000.0000 mg | ORAL_TABLET | Freq: Once | ORAL | Status: AC
  Administered 2023-01-30: 1000 mg via ORAL
  Filled 2023-01-30: qty 2

## 2023-01-30 MED ORDER — AMOXICILLIN-POT CLAVULANATE 875-125 MG PO TABS: 1.0000 | ORAL_TABLET | Freq: Two times a day (BID) | ORAL | 0 refills | Status: DC

## 2023-01-30 NOTE — ED Notes (Signed)
Pt is drinking and tolerating apple juice at this time.

## 2023-01-30 NOTE — Discharge Instructions (Addendum)
For pain control you may take 1000 mg of Tylenol every 8 hours as needed.  

## 2023-05-09 DIAGNOSIS — H9202 Otalgia, left ear: Secondary | ICD-10-CM | POA: Diagnosis not present

## 2023-05-12 ENCOUNTER — Other Ambulatory Visit (HOSPITAL_COMMUNITY): Payer: Self-pay

## 2023-06-09 DIAGNOSIS — M19071 Primary osteoarthritis, right ankle and foot: Secondary | ICD-10-CM | POA: Diagnosis not present

## 2023-06-16 DIAGNOSIS — M25512 Pain in left shoulder: Secondary | ICD-10-CM | POA: Diagnosis not present

## 2023-06-17 DIAGNOSIS — H6121 Impacted cerumen, right ear: Secondary | ICD-10-CM | POA: Diagnosis not present

## 2023-06-17 DIAGNOSIS — H903 Sensorineural hearing loss, bilateral: Secondary | ICD-10-CM | POA: Diagnosis not present

## 2023-06-17 DIAGNOSIS — J309 Allergic rhinitis, unspecified: Secondary | ICD-10-CM | POA: Diagnosis not present

## 2023-07-06 ENCOUNTER — Encounter (INDEPENDENT_AMBULATORY_CARE_PROVIDER_SITE_OTHER): Payer: Self-pay | Admitting: Otolaryngology

## 2023-07-19 DIAGNOSIS — D225 Melanocytic nevi of trunk: Secondary | ICD-10-CM | POA: Diagnosis not present

## 2023-07-19 DIAGNOSIS — D2271 Melanocytic nevi of right lower limb, including hip: Secondary | ICD-10-CM | POA: Diagnosis not present

## 2023-07-19 DIAGNOSIS — L821 Other seborrheic keratosis: Secondary | ICD-10-CM | POA: Diagnosis not present

## 2023-07-19 DIAGNOSIS — L814 Other melanin hyperpigmentation: Secondary | ICD-10-CM | POA: Diagnosis not present

## 2023-07-21 DIAGNOSIS — Z9889 Other specified postprocedural states: Secondary | ICD-10-CM | POA: Diagnosis not present

## 2023-07-21 DIAGNOSIS — E113393 Type 2 diabetes mellitus with moderate nonproliferative diabetic retinopathy without macular edema, bilateral: Secondary | ICD-10-CM | POA: Diagnosis not present

## 2023-07-21 DIAGNOSIS — H353132 Nonexudative age-related macular degeneration, bilateral, intermediate dry stage: Secondary | ICD-10-CM | POA: Diagnosis not present

## 2023-08-02 DIAGNOSIS — E7800 Pure hypercholesterolemia, unspecified: Secondary | ICD-10-CM | POA: Diagnosis not present

## 2023-08-02 DIAGNOSIS — M797 Fibromyalgia: Secondary | ICD-10-CM | POA: Diagnosis not present

## 2023-08-02 DIAGNOSIS — E113393 Type 2 diabetes mellitus with moderate nonproliferative diabetic retinopathy without macular edema, bilateral: Secondary | ICD-10-CM | POA: Diagnosis not present

## 2023-08-02 DIAGNOSIS — E538 Deficiency of other specified B group vitamins: Secondary | ICD-10-CM | POA: Diagnosis not present

## 2023-08-02 DIAGNOSIS — E890 Postprocedural hypothyroidism: Secondary | ICD-10-CM | POA: Diagnosis not present

## 2023-08-02 DIAGNOSIS — E1169 Type 2 diabetes mellitus with other specified complication: Secondary | ICD-10-CM | POA: Diagnosis not present

## 2023-08-02 DIAGNOSIS — K219 Gastro-esophageal reflux disease without esophagitis: Secondary | ICD-10-CM | POA: Diagnosis not present

## 2023-08-02 DIAGNOSIS — I10 Essential (primary) hypertension: Secondary | ICD-10-CM | POA: Diagnosis not present

## 2023-08-02 DIAGNOSIS — Z85850 Personal history of malignant neoplasm of thyroid: Secondary | ICD-10-CM | POA: Diagnosis not present

## 2023-08-02 DIAGNOSIS — I739 Peripheral vascular disease, unspecified: Secondary | ICD-10-CM | POA: Diagnosis not present

## 2023-08-02 DIAGNOSIS — M810 Age-related osteoporosis without current pathological fracture: Secondary | ICD-10-CM | POA: Diagnosis not present

## 2023-08-02 DIAGNOSIS — Z0000 Encounter for general adult medical examination without abnormal findings: Secondary | ICD-10-CM | POA: Diagnosis not present

## 2023-08-09 DIAGNOSIS — M25512 Pain in left shoulder: Secondary | ICD-10-CM | POA: Diagnosis not present

## 2023-09-10 ENCOUNTER — Encounter (HOSPITAL_COMMUNITY): Payer: Self-pay | Admitting: Emergency Medicine

## 2023-09-10 ENCOUNTER — Observation Stay (HOSPITAL_COMMUNITY)
Admission: EM | Admit: 2023-09-10 | Discharge: 2023-09-13 | Disposition: A | Discharge status: Home / Self Care | Attending: Internal Medicine | Admitting: Internal Medicine

## 2023-09-10 ENCOUNTER — Emergency Department (HOSPITAL_COMMUNITY)

## 2023-09-10 ENCOUNTER — Inpatient Hospital Stay (HOSPITAL_COMMUNITY)

## 2023-09-10 ENCOUNTER — Other Ambulatory Visit: Payer: Self-pay

## 2023-09-10 DIAGNOSIS — E113391 Type 2 diabetes mellitus with moderate nonproliferative diabetic retinopathy without macular edema, right eye: Secondary | ICD-10-CM | POA: Diagnosis present

## 2023-09-10 DIAGNOSIS — Z85850 Personal history of malignant neoplasm of thyroid: Secondary | ICD-10-CM | POA: Diagnosis not present

## 2023-09-10 DIAGNOSIS — I4719 Other supraventricular tachycardia: Secondary | ICD-10-CM | POA: Insufficient documentation

## 2023-09-10 DIAGNOSIS — H5462 Unqualified visual loss, left eye, normal vision right eye: Secondary | ICD-10-CM | POA: Diagnosis not present

## 2023-09-10 DIAGNOSIS — Z7982 Long term (current) use of aspirin: Secondary | ICD-10-CM | POA: Diagnosis not present

## 2023-09-10 DIAGNOSIS — Z79890 Hormone replacement therapy (postmenopausal): Secondary | ICD-10-CM | POA: Diagnosis not present

## 2023-09-10 DIAGNOSIS — H3412 Central retinal artery occlusion, left eye: Secondary | ICD-10-CM | POA: Diagnosis not present

## 2023-09-10 DIAGNOSIS — D75839 Thrombocytosis, unspecified: Secondary | ICD-10-CM | POA: Insufficient documentation

## 2023-09-10 DIAGNOSIS — I639 Cerebral infarction, unspecified: Secondary | ICD-10-CM | POA: Diagnosis not present

## 2023-09-10 DIAGNOSIS — H547 Unspecified visual loss: Secondary | ICD-10-CM | POA: Diagnosis not present

## 2023-09-10 DIAGNOSIS — I700 Atherosclerosis of aorta: Secondary | ICD-10-CM | POA: Insufficient documentation

## 2023-09-10 DIAGNOSIS — J342 Deviated nasal septum: Secondary | ICD-10-CM | POA: Insufficient documentation

## 2023-09-10 DIAGNOSIS — I3139 Other pericardial effusion (noninflammatory): Secondary | ICD-10-CM | POA: Diagnosis not present

## 2023-09-10 DIAGNOSIS — E039 Hypothyroidism, unspecified: Secondary | ICD-10-CM | POA: Insufficient documentation

## 2023-09-10 DIAGNOSIS — Z7984 Long term (current) use of oral hypoglycemic drugs: Secondary | ICD-10-CM | POA: Insufficient documentation

## 2023-09-10 DIAGNOSIS — H53469 Homonymous bilateral field defects, unspecified side: Secondary | ICD-10-CM | POA: Diagnosis not present

## 2023-09-10 DIAGNOSIS — R29701 NIHSS score 1: Secondary | ICD-10-CM

## 2023-09-10 DIAGNOSIS — J45909 Unspecified asthma, uncomplicated: Secondary | ICD-10-CM | POA: Diagnosis not present

## 2023-09-10 DIAGNOSIS — I10 Essential (primary) hypertension: Secondary | ICD-10-CM | POA: Insufficient documentation

## 2023-09-10 DIAGNOSIS — I6782 Cerebral ischemia: Secondary | ICD-10-CM | POA: Diagnosis not present

## 2023-09-10 DIAGNOSIS — R29818 Other symptoms and signs involving the nervous system: Secondary | ICD-10-CM | POA: Diagnosis not present

## 2023-09-10 DIAGNOSIS — R41841 Cognitive communication deficit: Secondary | ICD-10-CM | POA: Insufficient documentation

## 2023-09-10 DIAGNOSIS — E890 Postprocedural hypothyroidism: Secondary | ICD-10-CM | POA: Insufficient documentation

## 2023-09-10 DIAGNOSIS — Z7902 Long term (current) use of antithrombotics/antiplatelets: Secondary | ICD-10-CM | POA: Insufficient documentation

## 2023-09-10 DIAGNOSIS — D6851 Activated protein C resistance: Secondary | ICD-10-CM | POA: Diagnosis not present

## 2023-09-10 DIAGNOSIS — I6340 Cerebral infarction due to embolism of unspecified cerebral artery: Principal | ICD-10-CM | POA: Insufficient documentation

## 2023-09-10 DIAGNOSIS — E1151 Type 2 diabetes mellitus with diabetic peripheral angiopathy without gangrene: Secondary | ICD-10-CM | POA: Diagnosis not present

## 2023-09-10 DIAGNOSIS — D320 Benign neoplasm of cerebral meninges: Secondary | ICD-10-CM | POA: Diagnosis not present

## 2023-09-10 DIAGNOSIS — D329 Benign neoplasm of meninges, unspecified: Secondary | ICD-10-CM | POA: Diagnosis not present

## 2023-09-10 DIAGNOSIS — Z794 Long term (current) use of insulin: Secondary | ICD-10-CM | POA: Diagnosis not present

## 2023-09-10 DIAGNOSIS — Z79899 Other long term (current) drug therapy: Secondary | ICD-10-CM | POA: Insufficient documentation

## 2023-09-10 DIAGNOSIS — G319 Degenerative disease of nervous system, unspecified: Secondary | ICD-10-CM | POA: Diagnosis not present

## 2023-09-10 LAB — CBC
HCT: 44.4 % (ref 36.0–46.0)
HCT: 41.2 % (ref 36.0–46.0)
Hemoglobin: 15 g/dL (ref 12.0–15.0)
Hemoglobin: 13.7 g/dL (ref 12.0–15.0)
MCH: 30.1 pg (ref 26.0–34.0)
MCH: 29.8 pg (ref 26.0–34.0)
MCHC: 33.8 g/dL (ref 30.0–36.0)
MCHC: 33.3 g/dL (ref 30.0–36.0)
MCV: 89.2 fL (ref 80.0–100.0)
MCV: 89.6 fL (ref 80.0–100.0)
Platelets: 481 10*3/uL — ABNORMAL HIGH (ref 150–400)
Platelets: 433 10*3/uL — ABNORMAL HIGH (ref 150–400)
RBC: 4.98 MIL/uL (ref 3.87–5.11)
RBC: 4.6 MIL/uL (ref 3.87–5.11)
RDW: 13.7 % (ref 11.5–15.5)
RDW: 13.6 % (ref 11.5–15.5)
WBC: 9.4 10*3/uL (ref 4.0–10.5)
WBC: 11.4 10*3/uL — ABNORMAL HIGH (ref 4.0–10.5)
nRBC: 0 % (ref 0.0–0.2)
nRBC: 0 % (ref 0.0–0.2)

## 2023-09-10 LAB — DIFFERENTIAL
Abs Immature Granulocytes: 0.03 10*3/uL (ref 0.00–0.07)
Basophils Absolute: 0.1 10*3/uL (ref 0.0–0.1)
Basophils Relative: 1 %
Eosinophils Absolute: 0.2 10*3/uL (ref 0.0–0.5)
Eosinophils Relative: 3 %
Immature Granulocytes: 0 %
Lymphocytes Relative: 34 %
Lymphs Abs: 3.2 10*3/uL (ref 0.7–4.0)
Monocytes Absolute: 1.1 10*3/uL — ABNORMAL HIGH (ref 0.1–1.0)
Monocytes Relative: 11 %
Neutro Abs: 4.8 10*3/uL (ref 1.7–7.7)
Neutrophils Relative %: 51 %

## 2023-09-10 LAB — COMPREHENSIVE METABOLIC PANEL WITH GFR
ALT: 10 U/L (ref 0–44)
AST: 19 U/L (ref 15–41)
Albumin: 3.6 g/dL (ref 3.5–5.0)
Alkaline Phosphatase: 84 U/L (ref 38–126)
Anion gap: 15 (ref 5–15)
BUN: 13 mg/dL (ref 8–23)
CO2: 22 mmol/L (ref 22–32)
Calcium: 9.1 mg/dL (ref 8.9–10.3)
Chloride: 98 mmol/L (ref 98–111)
Creatinine, Ser: 0.67 mg/dL (ref 0.44–1.00)
GFR, Estimated: >60 mL/min (ref >60)
Glucose, Bld: 139 mg/dL — ABNORMAL HIGH (ref 70–99)
Potassium: 4.3 mmol/L (ref 3.5–5.1)
Sodium: 135 mmol/L (ref 135–145)
Total Bilirubin: 0.6 mg/dL (ref 0.0–1.2)
Total Protein: 6.8 g/dL (ref 6.5–8.1)

## 2023-09-10 LAB — APTT: aPTT: 31 s (ref 24–36)

## 2023-09-10 LAB — GLUCOSE, CAPILLARY
Glucose-Capillary: 158 mg/dL — ABNORMAL HIGH (ref 70–99)
Glucose-Capillary: 199 mg/dL — ABNORMAL HIGH (ref 70–99)

## 2023-09-10 LAB — I-STAT CHEM 8, ED
BUN: 13 mg/dL (ref 8–23)
Calcium, Ion: 1.09 mmol/L — ABNORMAL LOW (ref 1.15–1.40)
Chloride: 98 mmol/L (ref 98–111)
Creatinine, Ser: 0.6 mg/dL (ref 0.44–1.00)
Glucose, Bld: 144 mg/dL — ABNORMAL HIGH (ref 70–99)
HCT: 47 % — ABNORMAL HIGH (ref 36.0–46.0)
Hemoglobin: 16 g/dL — ABNORMAL HIGH (ref 12.0–15.0)
Potassium: 4.1 mmol/L (ref 3.5–5.1)
Sodium: 134 mmol/L — ABNORMAL LOW (ref 135–145)
TCO2: 22 mmol/L (ref 22–32)

## 2023-09-10 LAB — CREATININE, SERUM
Creatinine, Ser: 0.7 mg/dL (ref 0.44–1.00)
GFR, Estimated: >60 mL/min (ref >60)

## 2023-09-10 LAB — SEDIMENTATION RATE: Sed Rate: 4 mm/h (ref 0–22)

## 2023-09-10 LAB — C-REACTIVE PROTEIN: CRP: <0.5 mg/dL (ref <1.0)

## 2023-09-10 LAB — CBG MONITORING, ED: Glucose-Capillary: 148 mg/dL — ABNORMAL HIGH (ref 70–99)

## 2023-09-10 LAB — PROTIME-INR
INR: 1 (ref 0.8–1.2)
Prothrombin Time: 13.2 s (ref 11.4–15.2)

## 2023-09-10 LAB — ETHANOL: Alcohol, Ethyl (B): <15 mg/dL (ref <15)

## 2023-09-10 MED ORDER — SODIUM CHLORIDE 0.9% FLUSH: 3.0000 mL | INTRAVENOUS | Status: DC | PRN

## 2023-09-10 MED ORDER — INSULIN ASPART 100 UNIT/ML IJ SOLN
0.0000 [IU] | Freq: Three times a day (TID) | INTRAMUSCULAR | Status: DC
  Administered 2023-09-11 (×2): 2 [IU] via SUBCUTANEOUS
  Administered 2023-09-12: 1 [IU] via SUBCUTANEOUS
  Administered 2023-09-12: 2 [IU] via SUBCUTANEOUS
  Administered 2023-09-12 – 2023-09-13 (×3): 1 [IU] via SUBCUTANEOUS

## 2023-09-10 MED ORDER — ENOXAPARIN SODIUM 40 MG/0.4ML IJ SOSY
40.0000 mg | PREFILLED_SYRINGE | Freq: Every day | INTRAMUSCULAR | Status: DC
  Administered 2023-09-10 – 2023-09-12 (×3): 40 mg via SUBCUTANEOUS
  Filled 2023-09-10 (×3): qty 0.4

## 2023-09-10 MED ORDER — SODIUM CHLORIDE 0.9% FLUSH: 3.0000 mL | Freq: Once | INTRAVENOUS | Status: DC

## 2023-09-10 MED ORDER — STROKE: EARLY STAGES OF RECOVERY BOOK
Freq: Once | Status: DC
  Filled 2023-09-10: qty 1

## 2023-09-10 MED ORDER — ACETAMINOPHEN 650 MG RE SUPP: 650.0000 mg | RECTAL | Status: DC | PRN

## 2023-09-10 MED ORDER — LEVOTHYROXINE SODIUM 88 MCG PO TABS
88.0000 ug | ORAL_TABLET | Freq: Every day | ORAL | Status: DC
  Administered 2023-09-11 – 2023-09-13 (×3): 88 ug via ORAL
  Filled 2023-09-10 (×3): qty 1

## 2023-09-10 MED ORDER — SODIUM CHLORIDE 0.9% FLUSH
3.0000 mL | Freq: Two times a day (BID) | INTRAVENOUS | Status: DC
  Administered 2023-09-10: 10 mL via INTRAVENOUS
  Administered 2023-09-11: 3 mL via INTRAVENOUS
  Administered 2023-09-11: 10 mL via INTRAVENOUS
  Administered 2023-09-12: 3 mL via INTRAVENOUS

## 2023-09-10 MED ORDER — STROKE: EARLY STAGES OF RECOVERY BOOK: Freq: Once | Status: AC

## 2023-09-10 MED ORDER — ACETAMINOPHEN 325 MG PO TABS
650.0000 mg | ORAL_TABLET | ORAL | Status: DC | PRN
  Administered 2023-09-11 – 2023-09-12 (×2): 650 mg via ORAL
  Filled 2023-09-10 (×2): qty 2

## 2023-09-10 MED ORDER — DULOXETINE HCL 60 MG PO CPEP
60.0000 mg | ORAL_CAPSULE | Freq: Every day | ORAL | Status: DC
  Administered 2023-09-10 – 2023-09-12 (×3): 60 mg via ORAL
  Filled 2023-09-10 (×3): qty 1

## 2023-09-10 MED ORDER — SENNOSIDES-DOCUSATE SODIUM 8.6-50 MG PO TABS: 1.0000 | ORAL_TABLET | Freq: Every evening | ORAL | Status: DC | PRN

## 2023-09-10 MED ORDER — GADOBUTROL 1 MMOL/ML IV SOLN
7.0000 mL | Freq: Once | INTRAVENOUS | Status: AC | PRN
  Administered 2023-09-10: 7 mL via INTRAVENOUS

## 2023-09-10 MED ORDER — CLOPIDOGREL BISULFATE 75 MG PO TABS
75.0000 mg | ORAL_TABLET | Freq: Every day | ORAL | Status: DC
  Administered 2023-09-10 – 2023-09-13 (×4): 75 mg via ORAL
  Filled 2023-09-10 (×4): qty 1

## 2023-09-10 MED ORDER — ACETAMINOPHEN 500 MG PO TABS: 1000.0000 mg | ORAL_TABLET | Freq: Two times a day (BID) | ORAL | Status: DC | PRN

## 2023-09-10 MED ORDER — ACETAMINOPHEN 160 MG/5ML PO SOLN: 650.0000 mg | ORAL | Status: DC | PRN

## 2023-09-10 MED ORDER — PANTOPRAZOLE SODIUM 40 MG PO TBEC
40.0000 mg | DELAYED_RELEASE_TABLET | Freq: Every day | ORAL | Status: DC
  Administered 2023-09-10 – 2023-09-12 (×3): 40 mg via ORAL
  Filled 2023-09-10 (×3): qty 1

## 2023-09-10 MED ORDER — ASPIRIN 81 MG PO TBEC
81.0000 mg | DELAYED_RELEASE_TABLET | Freq: Every day | ORAL | Status: DC
Start: 2023-09-10 — End: 2023-09-13
  Administered 2023-09-10 – 2023-09-13 (×4): 81 mg via ORAL
  Filled 2023-09-10 (×4): qty 1

## 2023-09-10 NOTE — ED Notes (Signed)
 Patient transported to MRI

## 2023-09-10 NOTE — ED Notes (Signed)
 Pt given something to eat per provider ok & after she passed swallow eval.

## 2023-09-10 NOTE — H&P (Signed)
 History and Physical    Annette Gross NFA:[id number] DOB: 05/18/1940 DOA: 09/10/2023  Referring MD/NP/PA: EDP PCP:  Patient coming from:   Chief Complaint: Painless left eye vision loss  HPI: Annette Gross is a 83/F with history of type 2 diabetes mellitus, hypertension, GERD, factor V Leiden mutation, hypothyroidism, depression, macular degeneration presented to the ED with acute onset painless vision loss in part of her left eye, she was in her usual state of health when she went to bed last night.  Presented to the local ophthalmologist who directed her to the ER.  ED Course: Vital signs stable, labs largely unremarkable, thrombocytosis, MRI noted extensive bilateral embolic appearing infarcts  Review of Systems: As per HPI otherwise 14 point review of systems negative.   Past Medical History:  Diagnosis Date   Anemia    many yrs ago   Anxiety    Asthma    no recent issues   Cancer [facility])    s/p thyroidectomy 2018   Complication of anesthesia    Cystitis, interstitial    Degenerative disc disease, lumbar    Degenerative joint disease    spinal stenosis "Chronic low back pain"   Depression    Diabetes mellitus    diet control.   Diverticulitis 2005   Diverticulosis 2005   Factor V deficiency (HCC)    Fibromyalgia    Fracture of right foot 1995   GERD (gastroesophageal reflux disease)    History of hiatal hernia    Hyperlipidemia    Hypertension    off med x 5 yrs.   IBS (irritable bowel syndrome)    Insomnia    Iron deficiency    Liver hemangioma 1999   MRI   Mononucleosis    Morton's neuroma    Left foot   Nuclear sclerotic cataract of left eye 08/29/2019   Peripheral neuropathy    Pneumonia    years 89 & 89.  None since   PONV (postoperative nausea and vomiting)    nausea no vomiting   Rectocele    Transfusion history    child x2, many yrs ago after childbirth"hemorrhage"   Umbilical hernia 2012    Past Surgical History:  Procedure Laterality Date    blood vessel tumor removal  1980   from chin   CHOLECYSTECTOMY     COLONOSCOPY WITH PROPOFOL  N/A 09/02/2015   Procedure: COLONOSCOPY WITH PROPOFOL ;  Surgeon: Garrett Kallman, MD;  Location: [facility];  Service: Endoscopy;  Laterality: N/A;   GANGLION CYST EXCISION     right   HEMORRHOID SURGERY     HERNIA REPAIR     repair was aimed at the [facility]   HIATAL HERNIA REPAIR     and nissen fundoplication   LAPAROSCOPIC ESOPHAGOGASTRIC FUNDOPLASTY     LAPAROSCOPIC INCISIONAL / UMBILICAL / VENTRAL HERNIA REPAIR     umbilical hernia   OOPHORECTOMY     left   RIGHT OOPHORECTOMY     '05-laparaoscopic   SHOULDER SURGERY Right    THYROIDECTOMY N/A 07/06/2016   Procedure: TOTAL THYROIDECTOMY;  Surgeon: Adalberto Hollow, MD;  Location: [facility] OR;  Service: General;  Laterality: N/A;   TONSILLECTOMY     TOTAL THYROIDECTOMY  07/06/2016     reports that she has never smoked. She has never used smokeless tobacco. She reports current alcohol use of about 2.0 standard drinks of alcohol per week. She reports that she does not use drugs.  Allergies  Allergen Reactions   Iohexol Anaphylaxis  Ivp Dye [Iodinated Contrast Media] Anaphylaxis   Kiwi Extract Anaphylaxis   Neurontin [Gabapentin] Other (See Comments)    Auditory hallucinations   Sulfa Antibiotics Hives   Asa [Aspirin] Other (See Comments)    Stomach pain   Cipro [Ciprofloxacin Hcl] Other (See Comments)    Tendonitis    Lexapro [Escitalopram Oxalate] Other (See Comments)    Myalgias    Lipitor [Atorvastatin] Other (See Comments)    Myalgias    Lomotil [Diphenoxylate-Atropine] Other (See Comments)    Severe stomach pain/cramps Dizziness   Lyrica [Pregabalin] Other (See Comments)    Auditory hallucinations   Codeine Other (See Comments)    Severe stomach cramps   Diphenoxylate-Atropine Other (See Comments)    Severe stomach and dizziness    Family History  Problem Relation Age of Onset   Stroke Father    Heart disease Father       Prior to Admission medications   Medication Sig Start Date End Date Taking? Authorizing Provider  acetaminophen  (TYLENOL ) 500 MG tablet Take 1,000 mg by mouth 2 (two) times daily as needed for moderate pain (pain score 4-6) or headache.   Yes [provider]  Cholecalciferol (VITAMIN D-3 PO) Take 1 capsule by mouth daily.   Yes [provider]  Cyanocobalamin  (VITAMIN B-12 PO) Take 1 tablet by mouth daily.   Yes [provider]  DULoxetine  (CYMBALTA ) 60 MG capsule Take 60 mg by mouth at bedtime.   Yes [provider]  Evolocumab (REPATHA) 140 MG/ML SOSY Inject 140 mg into the skin See admin instructions. Inject 1 dose (140mg ) subcutaneously every 14 days on every other Tuesday.   Yes [provider]  fluticasone  (FLONASE ) 50 MCG/ACT nasal spray Place 1 spray into the nose daily.   Yes [provider]  levocetirizine (XYZAL ) 5 MG tablet Take 5 mg by mouth at bedtime. 08/09/15  Yes [provider]  levothyroxine  (SYNTHROID ) 88 MCG tablet Take 88 mcg by mouth daily before breakfast.   Yes [provider]  losartan (COZAAR) 25 MG tablet Take 25 mg by mouth at bedtime.   Yes [provider]  metFORMIN  (GLUCOPHAGE ) 500 MG tablet Take 1,000 mg by mouth 2 (two) times daily after a meal.   Yes [provider]  Multiple Vitamins-Minerals (PRESERVISION AREDS 2) CAPS Take 1 capsule by mouth daily.   Yes [provider]  omeprazole (PRILOSEC) 40 MG capsule Take 40 mg by mouth at bedtime.   Yes [provider]    Physical Exam: Vitals:   09/10/23 1154 09/10/23 1154 09/10/23 1300 09/10/23 1340  BP:  (!) 129/95 131/83 134/77  Pulse:  (!) 116 99 95  Resp:  16  10  Temp:  98.2 F (36.8 C)    SpO2:  94% 98% 98%  Weight: 71.7 kg         Constitutional: NAD, calm, comfortable HEENT:no JVD Respiratory: clear to auscultation bilaterally Cardiovascular: S1S2/RRR Abdomen: soft, non tender, Bowel  sounds positive.  Musculoskeletal: No joint deformity upper and lower extremities. Ext: no edema Skin: no rashes Neurologic: CN 2-12 grossly intact. Sensation intact, DTR normal. Strength 5/5 in all 4.,  Visual field deficits in left eye Psychiatric: Normal judgment and insight. Alert and oriented x 3. Normal mood.   Labs on Admission: I have personally reviewed following labs and imaging studies  CBC: Recent Labs  Lab 09/10/23 1223 09/10/23 1226  WBC  --  9.4  NEUTROABS  --  4.8  HGB 16.0* 15.0  HCT 47.0* 44.4  MCV  --  89.2  PLT  --  481*   Basic Metabolic Panel: Recent Labs  Lab 09/10/23 1223 09/10/23 1226  NA 134* 135  K 4.1 4.3  CL 98 98  CO2  --  22  GLUCOSE 144* 139*  BUN 13 13  CREATININE 0.60 0.67  CALCIUM   --  9.1   GFR: CrCl cannot be calculated (Unknown ideal weight.). Liver Function Tests: Recent Labs  Lab 09/10/23 1226  AST 19  ALT 10  ALKPHOS 84  BILITOT 0.6  PROT 6.8  ALBUMIN 3.6   No results for input(s): "LIPASE", "AMYLASE" in the last 168 hours. No results for input(s): "AMMONIA" in the last 168 hours. Coagulation Profile: Recent Labs  Lab 09/10/23 1226  INR 1.0   Cardiac Enzymes: No results for input(s): "CKTOTAL", "CKMB", "CKMBINDEX", "TROPONINI" in the last 168 hours. BNP (last 3 results) No results for input(s): "PROBNP" in the last 8760 hours. HbA1C: No results for input(s): "HGBA1C" in the last 72 hours. CBG: Recent Labs  Lab 09/10/23 1249  GLUCAP 148*   Lipid Profile: No results for input(s): "CHOL", "HDL", "LDLCALC", "TRIG", "CHOLHDL", "LDLDIRECT" in the last 72 hours. Thyroid  Function Tests: No results for input(s): "TSH", "T4TOTAL", "FREET4", "T3FREE", "THYROIDAB" in the last 72 hours. Anemia Panel: No results for input(s): "VITAMINB12", "FOLATE", "FERRITIN", "TIBC", "IRON", "RETICCTPCT" in the last 72 hours. Urine analysis:    Component Value Date/Time   COLORURINE YELLOW 01/29/2023 2252   APPEARANCEUR CLOUDY  (A) 01/29/2023 2252   LABSPEC 1.036 (H) 01/29/2023 2252   PHURINE 5.5 01/29/2023 2252   GLUCOSEU NEGATIVE 01/29/2023 2252   HGBUR NEGATIVE 01/29/2023 2252   BILIRUBINUR NEGATIVE 01/29/2023 2252   KETONESUR 15 (A) 01/29/2023 2252   PROTEINUR 100 (A) 01/29/2023 2252   UROBILINOGEN 0.2 07/23/2007 0932   NITRITE NEGATIVE 01/29/2023 2252   LEUKOCYTESUR MODERATE (A) 01/29/2023 2252   Sepsis Labs: @LABRCNTIP (procalcitonin:4,lacticidven:4) )No results found for this or any previous visit (from the past 240 hours).   Radiological Exams on Admission: MR BRAIN WO CONTRAST Result Date: 09/10/2023 CLINICAL DATA:  Provided history: Vision loss, monocular. Left eye vision loss. Concern for CRAO. EXAM: MRI HEAD WITHOUT CONTRAST TECHNIQUE: Multiplanar, multiecho pulse sequences of the brain and surrounding structures were obtained without intravenous contrast. COMPARISON:  Head CT 09/10/2023. FINDINGS: Brain: Generalized cerebral atrophy. There are multiple small acute infarcts scattered within the bilateral cerebral hemispheres, 10-15 in number. The largest is an 8 mm acute cortically-based infarct within the right parietal lobe (series 2, image 33). 8 mm dural-based mass overlying the anteromedial right frontal lobe most consistent with a meningioma (series 7, image 20). The mass slightly indents the underlying brain parenchyma. No underlying parenchymal edema. Multifocal T2 FLAIR hyperintense signal abnormality within the cerebral white matter and pons, nonspecific but compatible with moderate chronic small vessel ischemic disease. Punctate chronic microhemorrhage within the right lentiform nucleus. Prominent perivascular spaces with within the bilateral cerebral hemispheric white matter and within/about the bilateral basal ganglia. Cavum septum pellucidum and cavum vergae (anatomic variant). No extra-axial fluid collection. No midline shift. Vascular: Maintained flow voids within the proximal large arterial  vessels. Skull and upper cervical spine: No focal worrisome marrow lesion. Incompletely assessed cervical spondylosis. Sinuses/Orbits: No mass or acute finding within the imaged orbits. No significant paranasal sinus disease. IMPRESSION: 1. Multiple small acute infarcts scattered within the bilateral cerebral hemispheres, 10-15 in number and measuring up to 8 mm. Given involvement of multiple vascular territories, findings suggest  sequelae of an embolic process. 2. 8 mm meningioma overlying the anterior right frontal lobe. The mass slightly indents the underlying brain parenchyma without underlying parenchymal edema. 3. Background parenchymal atrophy and chronic small vessel ischemic disease. Electronically Signed   By: Bascom Lily D.O.   On: 09/10/2023 15:28   CT HEAD WO CONTRAST Result Date: 09/10/2023 CLINICAL DATA:  Neuro deficit, acute, stroke suspected. EXAM: CT HEAD WITHOUT CONTRAST TECHNIQUE: Contiguous axial images were obtained from the base of the skull through the vertex without intravenous contrast. RADIATION DOSE REDUCTION: This exam was performed according to the departmental dose-optimization program which includes automated exposure control, adjustment of the mA and/or kV according to patient size and/or use of iterative reconstruction technique. COMPARISON:  None Available. FINDINGS: Brain: Age related volume loss. Chronic small-vessel ischemic changes of the white matter and basal ganglia. Probable small vessel change of the pons. No evidence of acute infarction, mass lesion, hemorrhage, hydrocephalus or extra-axial collection. Vascular: There is atherosclerotic calcification of the major vessels at the base of the brain. Skull: Negative Sinuses/Orbits: Clear/normal Other: None IMPRESSION: No acute CT finding. Age related volume loss. Chronic small-vessel ischemic changes of the white matter and basal ganglia. Electronically Signed   By: Bettylou Brunner M.D.   On: 09/10/2023 14:20    EKG:  Independently reviewed. Just ordered and pending  Assessment/Plan Principal Problem:  Acute embolic stroke - Admit to telemetry - Neurology consulting, MRI noted bilateral extensive infarcts - Starting on aspirin and Plavix daily - No known history of A-fib, monitor on telemetry, check echo - Will likely need event monitor or loop recorder - Check LDL in a.m. and HbA1c - PT OT, SLP eval    HTN - Holding losartan for permissive hypertension  History of hypothyroidism Continue Synthroid    DVT prophylaxis: lovenox  Code Status: FUll Code Family Communication: none present Disposition Plan: home in 1-2days Consults called: Neuro Admission status: inpt  Deforest Fast MD [facility]   09/10/2023, 4:38 PM

## 2023-09-10 NOTE — Progress Notes (Signed)
 19:15H Received patient from MRI.

## 2023-09-10 NOTE — ED Triage Notes (Signed)
 Pt woke this morning at 9 am with loss of vision in left eye. Went to ophthalmologist and was told there was a blockage behind left eye and to come to ER for full stroke workup. Pt went to bed at 1am with no symptoms. Pt states can see some light.

## 2023-09-10 NOTE — ED Provider Notes (Signed)
 [place] [facility] Provider Note   CSN: [id number] Arrival date & time: 09/10/23  1144     History {Add pertinent medical, surgical, social history, OB history to HPI:1} Chief Complaint  Patient presents with   Loss of Vision    Annette Gross is a 83 y.o. female.  83 year old female presents from ophthalmology clinic for concern of acute painless vision loss in the left eye.  Patient noticed this upon waking up this morning.  Last night she went to bed without any complaints.  Denies any weakness, speech change, or other complaints.  The history is provided by the patient. No language interpreter was used.       Home Medications Prior to Admission medications   Medication Sig Start Date End Date Taking? Authorizing Provider  acetaminophen  (TYLENOL ) 500 MG tablet Take 1,000 mg by mouth 2 (two) times daily.    [provider]  albuterol  (PROVENTIL  HFA;VENTOLIN  HFA) 108 (90 Base) MCG/ACT inhaler Inhale 2 puffs into the lungs every 4 (four) hours as needed for wheezing or shortness of breath.    [provider]  amoxicillin -clavulanate (AUGMENTIN ) 875-125 MG tablet Take 1 tablet by mouth every 12 (twelve) hours. 01/30/23   Lindle Rhea, MD  Cholecalciferol (VITAMIN D3) 2000 units TABS Take 4,000 Units by mouth daily.    [provider]  docusate sodium  (COLACE) 100 MG capsule Take 100 mg by mouth 2 (two) times daily as needed for constipation.    [provider]  DULoxetine  (CYMBALTA ) 30 MG capsule Take 30 mg by mouth daily. 08/09/15   [provider]  EPINEPHrine 0.3 mg/0.3 mL IJ SOAJ injection Inject 0.3 mg into the muscle as needed.    [provider]  fluticasone  (FLONASE ) 50 MCG/ACT nasal spray Place 2 sprays into the nose daily.     [provider]  levocetirizine (XYZAL ) 5 MG tablet Take 5 mg by mouth every evening. 08/09/15   [provider]  levothyroxine   (SYNTHROID , LEVOTHROID) 25 MCG tablet Take 25 mcg by mouth daily before breakfast.    [provider]  montelukast  (SINGULAIR ) 10 MG tablet Take 10 mg by mouth every evening. 06/07/15   [provider]  Multiple Vitamins-Minerals (MULTIVITAMIN ADULT) CHEW Chew 2 each by mouth daily.    [provider]  omeprazole (PRILOSEC) 40 MG capsule Take 40 mg by mouth daily.      [provider]      Allergies    Iohexol, Ivp dye [iodinated contrast media], Kiwi extract, Neurontin [gabapentin], Sulfa antibiotics, Asa [aspirin], Cipro [ciprofloxacin hcl], Lexapro [escitalopram oxalate], Lipitor [atorvastatin], Lomotil [diphenoxylate-atropine], Lyrica [pregabalin], Codeine, and Diphenoxylate-atropine    Review of Systems   Review of Systems  Eyes:  Positive for visual disturbance.  Neurological:  Negative for syncope, speech difficulty, weakness, numbness and headaches.    Physical Exam Updated Vital Signs BP (!) 129/95   Pulse (!) 116   Temp 98.2 F (36.8 C)   Resp 16   Wt 71.7 kg   SpO2 94%   BMI 30.86 kg/m  Physical Exam Vitals and nursing note reviewed.  Constitutional:      General: She is not in acute distress.    Appearance: Normal appearance. She is not ill-appearing.  HENT:     Head: Normocephalic and atraumatic.     Nose: Nose normal.  Eyes:     Conjunctiva/sclera: Conjunctivae normal.  Cardiovascular:     Rate and Rhythm: Normal rate and  regular rhythm.  Pulmonary:     Effort: Pulmonary effort is normal. No respiratory distress.  Musculoskeletal:        General: No deformity. Normal range of motion.     Cervical back: Normal range of motion.  Skin:    Findings: No rash.  Neurological:     Mental Status: She is alert and oriented to person, place, and time. Mental status is at baseline.     Cranial Nerves: No cranial nerve deficit.     Motor: No weakness.     ED Results / Procedures / Treatments   Labs (all labs ordered are listed,  but only abnormal results are displayed) Labs Reviewed  I-STAT CHEM 8, ED - Abnormal; Notable for the following components:      Result Value   Sodium 134 (*)    Glucose, Bld 144 (*)    Calcium , Ion 1.09 (*)    Hemoglobin 16.0 (*)    HCT 47.0 (*)    All other components within normal limits  CBG MONITORING, ED - Abnormal; Notable for the following components:   Glucose-Capillary 148 (*)    All other components within normal limits  PROTIME-INR  APTT  CBC  DIFFERENTIAL  COMPREHENSIVE METABOLIC PANEL WITH GFR  ETHANOL  SEDIMENTATION RATE  C-REACTIVE PROTEIN    EKG None  Radiology No results found.  Procedures .Critical Care  Performed by: Lucina Sabal, PA-C Authorized by: Lucina Sabal, PA-C   Critical care provider statement:    Critical care time (minutes):  50   Critical care was necessary to treat or prevent imminent or life-threatening deterioration of the following conditions: cva.   Critical care was time spent personally by me on the following activities:  Development of treatment plan with patient or surrogate, discussions with consultants, evaluation of patient's response to treatment, examination of patient, ordering and review of laboratory studies, ordering and review of radiographic studies, ordering and performing treatments and interventions, pulse oximetry, re-evaluation of patient's condition and review of old charts   Care discussed with: admitting provider     {Document cardiac monitor, telemetry assessment procedure when appropriate:1}  Medications Ordered in ED Medications  sodium chloride  flush (NS) 0.9 % injection 3 mL (has no administration in time range)    ED Course/ Medical Decision Making/ A&P   {   Click here for ABCD2, HEART and other calculatorsREFRESH Note before signing :1}                              Medical Decision Making Amount and/or Complexity of Data Reviewed Labs: ordered. Radiology: ordered.   Medical Decision Making / ED  Course   This patient presents to the ED for concern of ***, this involves an extensive number of treatment options, and is a complaint that carries with it a high risk of complications and morbidity.  The differential diagnosis includes ***  MDM: ***   Additional history obtained: -Additional history obtained from *** -External records from outside source obtained and reviewed including: Chart review including previous notes, labs, imaging, consultation notes   Lab Tests: -I ordered, reviewed, and interpreted labs.   The pertinent results include:   Labs Reviewed  CBC - Abnormal; Notable for the following components:      Result Value   Platelets 481 (*)    All other components within normal limits  DIFFERENTIAL - Abnormal; Notable for the following components:   Monocytes Absolute 1.1 (*)  All other components within normal limits  COMPREHENSIVE METABOLIC PANEL WITH GFR - Abnormal; Notable for the following components:   Glucose, Bld 139 (*)    All other components within normal limits  I-STAT CHEM 8, ED - Abnormal; Notable for the following components:   Sodium 134 (*)    Glucose, Bld 144 (*)    Calcium , Ion 1.09 (*)    Hemoglobin 16.0 (*)    HCT 47.0 (*)    All other components within normal limits  CBG MONITORING, ED - Abnormal; Notable for the following components:   Glucose-Capillary 148 (*)    All other components within normal limits  PROTIME-INR  APTT  ETHANOL  SEDIMENTATION RATE  C-REACTIVE PROTEIN      EKG  EKG Interpretation Date/Time:    Ventricular Rate:    PR Interval:    QRS Duration:    QT Interval:    QTC Calculation:   R Axis:      Text Interpretation:           Imaging Studies ordered: I ordered imaging studies including *** I independently visualized and interpreted imaging. I agree with the radiologist interpretation   Medicines ordered and prescription drug management: Meds ordered this encounter  Medications   sodium  chloride flush (NS) 0.9 % injection 3 mL    stroke: early stages of recovery book   aspirin EC tablet 81 mg   clopidogrel (PLAVIX) tablet 75 mg    -I have reviewed the patients home medicines and have made adjustments as needed  Critical interventions ***  Consultations Obtained: I requested consultation with the ***,  and discussed lab and imaging findings as well as pertinent plan - they recommend: ***   Cardiac Monitoring: The patient was maintained on a cardiac monitor.  I personally viewed and interpreted the cardiac monitored which showed an underlying rhythm of: ***  Social Determinants of Health:  Factors impacting patients care include: ***   Reevaluation: After the interventions noted above, I reevaluated the patient and found that they have :{resolved/improved/worsened:23923::"improved"}  Co morbidities that complicate the patient evaluation  Past Medical History:  Diagnosis Date   Anemia    many yrs ago   Anxiety    Asthma    no recent issues   Cancer (HCC)    s/p thyroidectomy 2018   Complication of anesthesia    Cystitis, interstitial    Degenerative disc disease, lumbar    Degenerative joint disease    spinal stenosis "Chronic low back pain"   Depression    Diabetes mellitus    diet control.   Diverticulitis 2005   Diverticulosis 2005   Factor V deficiency (HCC)    Fibromyalgia    Fracture of right foot 1995   GERD (gastroesophageal reflux disease)    History of hiatal hernia    Hyperlipidemia    Hypertension    off med x 5 yrs.   IBS (irritable bowel syndrome)    Insomnia    Iron deficiency    Liver hemangioma 1999   MRI   Mononucleosis    Morton's neuroma    Left foot   Nuclear sclerotic cataract of left eye 08/29/2019   Peripheral neuropathy    Pneumonia    years 89 & 89.  None since   PONV (postoperative nausea and vomiting)    nausea no vomiting   Rectocele    Transfusion history    child x2, many yrs ago after  childbirth"hemorrhage"   Umbilical hernia 2012  Dispostion: ***  @PCDICTATION @    Final Clinical Impression(s) / ED Diagnoses Final diagnoses:  None    Rx / DC Orders ED Discharge Orders     None

## 2023-09-10 NOTE — Consult Note (Signed)
 NEUROLOGY CONSULT NOTE   Date of service: Sep 10, 2023 Patient Name: Annette Gross MRN:  [id number] DOB:  [birth year] Chief Complaint: "left eye vision loss" Requesting Provider: Burnette Carte, MD  History of Present Illness  Annette Gross is a 83 y.o. female with hx of asthma, diabetes, hypertension, GERD, factor V deficiency, thyroidectomy, depression, 5 myalgia, IBS, hyperlipidemia and peripheral neuropathy who presents with painless loss of vision in the central field of the left eye.  She reports that her vision was normal when she went to bed at 1 AM and that when she awoke at 9 AM, she had impaired vision in her left eye.  She states that she can see a crescent-shaped area at the top of her field of vision but nothing else.  She is also noted to have a right lower quadrantanopsia on exam.  She went to her ophthalmologist, was told she had a CRAO and was sent here to the emergency department for further workup.  LKW: 0100 Modified rankin score: 0-Completely asymptomatic and back to baseline post- stroke IV Thrombolysis: No, outside of window EVT: No, exam not consistent with LVO and completed stroke seen on MRI  NIHSS components Score: Comment  1a Level of Conscious 0[x]  1[]  2[]  3[]      1b LOC Questions 0[x]  1[]  2[]       1c LOC Commands 0[x]  1[]  2[]       2 Best Gaze 0[x]  1[]  2[]       3 Visual 0[]  1[x]  2[]  3[]      4 Facial Palsy 0[x]  1[]  2[]  3[]      5a Motor Arm - left 0[x]  1[]  2[]  3[]  4[]  UN[]    5b Motor Arm - Right 0[x]  1[]  2[]  3[]  4[]  UN[]    6a Motor Leg - Left 0[x]  1[]  2[]  3[]  4[]  UN[]    6b Motor Leg - Right 0[x]  1[]  2[]  3[]  4[]  UN[]    7 Limb Ataxia 0[x]  1[]  2[]  UN[]      8 Sensory 0[x]  1[]  2[]  UN[]      9 Best Language 0[x]  1[]  2[]  3[]      10 Dysarthria 0[x]  1[]  2[]  UN[]      11 Extinct. and Inattention 0[x]  1[]  2[]       TOTAL:1       ROS  Comprehensive ROS performed and pertinent positives documented in HPI   Past History   Past Medical History:  Diagnosis Date    Anemia    many yrs ago   Anxiety    Asthma    no recent issues   Cancer [facility])    s/p thyroidectomy 2018   Complication of anesthesia    Cystitis, interstitial    Degenerative disc disease, lumbar    Degenerative joint disease    spinal stenosis "Chronic low back pain"   Depression    Diabetes mellitus    diet control.   Diverticulitis 2005   Diverticulosis 2005   Factor V deficiency (HCC)    Fibromyalgia    Fracture of right foot 1995   GERD (gastroesophageal reflux disease)    History of hiatal hernia    Hyperlipidemia    Hypertension    off med x 5 yrs.   IBS (irritable bowel syndrome)    Insomnia    Iron deficiency    Liver hemangioma 1999   MRI   Mononucleosis    Morton's neuroma    Left foot   Nuclear sclerotic cataract of left eye 08/29/2019   Peripheral neuropathy    Pneumonia  years 89 & 90.  None since   PONV (postoperative nausea and vomiting)    nausea no vomiting   Rectocele    Transfusion history    child x2, many yrs ago after childbirth"hemorrhage"   Umbilical hernia 2012    Past Surgical History:  Procedure Laterality Date   blood vessel tumor removal  1980   from chin   CHOLECYSTECTOMY     COLONOSCOPY WITH PROPOFOL  N/A 09/02/2015   Procedure: COLONOSCOPY WITH PROPOFOL ;  Surgeon: Garrett Kallman, MD;  Location: [facility];  Service: Endoscopy;  Laterality: N/A;   GANGLION CYST EXCISION     right   HEMORRHOID SURGERY     HERNIA REPAIR     repair was aimed at the [facility]   HIATAL HERNIA REPAIR     and nissen fundoplication   LAPAROSCOPIC ESOPHAGOGASTRIC FUNDOPLASTY     LAPAROSCOPIC INCISIONAL / UMBILICAL / VENTRAL HERNIA REPAIR     umbilical hernia   OOPHORECTOMY     left   RIGHT OOPHORECTOMY     '05-laparaoscopic   SHOULDER SURGERY Right    THYROIDECTOMY N/A 07/06/2016   Procedure: TOTAL THYROIDECTOMY;  Surgeon: Adalberto Hollow, MD;  Location: [facility] OR;  Service: General;  Laterality: N/A;   TONSILLECTOMY     TOTAL THYROIDECTOMY  07/06/2016     Family History: Family History  Problem Relation Age of Onset   Stroke Father    Heart disease Father     Social History  reports that she has never smoked. She has never used smokeless tobacco. She reports current alcohol use of about 2.0 standard drinks of alcohol per week. She reports that she does not use drugs.  Allergies  Allergen Reactions   Iohexol Anaphylaxis   Ivp Dye [Iodinated Contrast Media] Anaphylaxis   Kiwi Extract Anaphylaxis   Neurontin [Gabapentin] Other (See Comments)    Auditory hallucinations   Sulfa Antibiotics Hives   Asa [Aspirin] Other (See Comments)    Stomach pain   Cipro [Ciprofloxacin Hcl] Other (See Comments)    Tendonitis    Lexapro [Escitalopram Oxalate] Other (See Comments)    Myalgias    Lipitor [Atorvastatin] Other (See Comments)    Myalgias    Lomotil [Diphenoxylate-Atropine] Other (See Comments)    Severe stomach pain/cramps Dizziness   Lyrica [Pregabalin] Other (See Comments)    Auditory hallucinations   Codeine Other (See Comments)    Severe stomach cramps   Diphenoxylate-Atropine Other (See Comments)    Severe stomach and dizziness    Medications   Current Facility-Administered Medications:    [START ON 09/11/2023]  stroke: early stages of recovery book, , Does not apply, Once, de La Torre, Redby E, NP   aspirin EC tablet 81 mg, 81 mg, Oral, Daily, de Thayne Fine, Cortney E, NP   clopidogrel (PLAVIX) tablet 75 mg, 75 mg, Oral, Daily, de [employer], Cortney E, NP   sodium chloride  flush (NS) 0.9 % injection 3 mL, 3 mL, Intravenous, Once, Messick, Herma Longest, MD  Current Outpatient Medications:    acetaminophen  (TYLENOL ) 500 MG tablet, Take 1,000 mg by mouth 2 (two) times daily as needed for moderate pain (pain score 4-6) or headache., Disp: , Rfl:    Cholecalciferol (VITAMIN D-3 PO), Take 1 capsule by mouth daily., Disp: , Rfl:    Cyanocobalamin  (VITAMIN B-12 PO), Take 1 tablet by mouth daily., Disp: , Rfl:    DULoxetine   (CYMBALTA ) 60 MG capsule, Take 60 mg by mouth at bedtime., Disp: , Rfl:  Evolocumab (REPATHA) 140 MG/ML SOSY, Inject 140 mg into the skin See admin instructions. Inject 1 dose (140mg ) subcutaneously every 14 days on every other Tuesday., Disp: , Rfl:    fluticasone  (FLONASE ) 50 MCG/ACT nasal spray, Place 1 spray into the nose daily., Disp: , Rfl:    levocetirizine (XYZAL ) 5 MG tablet, Take 5 mg by mouth at bedtime., Disp: , Rfl: 3   levothyroxine  (SYNTHROID ) 88 MCG tablet, Take 88 mcg by mouth daily before breakfast., Disp: , Rfl:    losartan (COZAAR) 25 MG tablet, Take 25 mg by mouth at bedtime., Disp: , Rfl:    metFORMIN  (GLUCOPHAGE ) 500 MG tablet, Take 1,000 mg by mouth 2 (two) times daily after a meal., Disp: , Rfl:    Multiple Vitamins-Minerals (PRESERVISION AREDS 2) CAPS, Take 1 capsule by mouth daily., Disp: , Rfl:    omeprazole (PRILOSEC) 40 MG capsule, Take 40 mg by mouth at bedtime., Disp: , Rfl:   Vitals   Vitals:   09/27/23 1154 09-27-23 1154 2023-09-27 1300 27-Sep-2023 1340  BP:  (!) 129/95 131/83 134/77  Pulse:  (!) 116 99 95  Resp:  16  10  Temp:  98.2 F (36.8 C)    SpO2:  94% 98% 98%  Weight: 71.7 kg       Body mass index is 30.86 kg/m.  Physical Exam   Constitutional: Appears well-developed and well-nourished.  Psych: Affect appropriate to situation.  Eyes: No scleral injection.  HENT: No OP obstruction.  Head: Normocephalic.  Respiratory: Effort normal, non-labored breathing.  Skin: WDI.   Neurologic Examination    NEURO:  Mental Status: AA&Ox3, able to give clear and coherent history of present illness Speech/Language: speech is without dysarthria or aphasia.    Cranial Nerves:  II: PERRL.  Right lower quadrantanopsia with both eyes open, visual fields intact in right eye, able to see lights in the ceiling in the left eye but unable to count fingers III, IV, VI: EOMI. Eyelids elevate symmetrically.  V: Sensation is intact to light touch and symmetrical to  face.  VII: Smile is symmetrical.  VIII: hearing intact to voice. IX, X: Phonation is normal.  ZO:[id number] shrug 5/5. XII: tongue is midline without fasciculations. Motor: 5/5 strength to all muscle groups tested.  Tone: is normal and bulk is normal Sensation- Intact to light touch bilaterally. Extinction absent to light touch to DSS.   Coordination: FTN intact bilaterally Gait- deferred     Labs/Imaging/Neurodiagnostic studies   CBC:  Recent Labs  Lab 09-27-23 1223 09-27-2023 1226  WBC  --  9.4  NEUTROABS  --  4.8  HGB 16.0* 15.0  HCT 47.0* 44.4  MCV  --  89.2  PLT  --  481*   Basic Metabolic Panel:  Lab Results  Component Value Date   NA 135 09/27/23   K 4.3 2023-09-27   CO2 22 09-27-23   GLUCOSE 139 (H) 09/27/23   BUN 13 09/27/23   CREATININE 0.67 09/27/2023   CALCIUM  9.1 2023/09/27   GFRNONAA >60 Sep 27, 2023   GFRAA >60 07/10/2016   Lipid Panel: No results found for: "LDLCALC" HgbA1c:  Lab Results  Component Value Date   HGBA1C 7.3 (H) 06/29/2016   Urine Drug Screen: No results found for: "LABOPIA", "COCAINSCRNUR", "LABBENZ", "AMPHETMU", "THCU", "LABBARB"  Alcohol Level     Component Value Date/Time   ETH <15 2023-09-27 1220   INR  Lab Results  Component Value Date   INR 1.0 2023/09/27   APTT  Lab Results  Component Value Date   APTT 31 09/10/2023   AED levels: No results found for: "PHENYTOIN", "ZONISAMIDE", "LAMOTRIGINE", "LEVETIRACETA"  CT Head without contrast(Personally reviewed): No acute abnormality, age-related volume loss and chronic small vessel ischemic changes   MR Angio head without contrast and Carotid Duplex BL(Personally reviewed): Pending  MRI Brain(Personally reviewed): Multiple small ischemic infarcts, suggestive of an embolic process, 8 mm meningioma overlying anterior right frontal lobe, atrophy and chronic small vessel ischemic disease  ASSESSMENT   Annette Gross is a 83 y.o. female  with hx of asthma,  diabetes, hypertension, GERD, factor V deficiency, thyroidectomy, depression, 5 myalgia, IBS, hyperlipidemia and peripheral neuropathy who presents with painless loss of vision in the left eye.  Patient's vision was normal when she went to bed last night, and she noticed loss of vision in all areas of her left eye except for a crescent shaped area at the top of her field of vision when she woke up this morning.  She went to her ophthalmologist who directed her to the emergency department for evaluation of CRAO.  MRI brain reveals multiple embolic appearing infarcts.  Patient will need admission for full stroke workup and outpatient cardiac monitoring with either loop recorder or 30-day event monitor.  RECOMMENDATIONS  Stroke/TIA Workup  - Admit for stroke workup - Permissive HTN x48 hrs from sx onset goal BP <220/110. PRN labetalol  or hydralazine  if BP above these parameters. Avoid oral antihypertensives. - MRI brain wo contrast - MRA head and carotid ultrasound given contrast allergy - TTE w/ bubble - Check A1c and LDL + add statin per guidelines - EC aspirin 81 mg daily and Plavix 75 mg daily antiplt/anticoag - Outpatient cardiac monitoring with 30-day event monitor or loop recorder - q4 hr neuro checks - STAT head CT for any change in neuro exam - Tele - PT/OT/SLP - Stroke education - Amb referral to neurology upon discharge   ______________________________________________________________________  Patient seen by NP and then by MD, MD to edit note as needed.  Signed, Cortney E Bucky Cardinal, NP [facility]    NEUROHOSPITALIST ADDENDUM Performed a face to face diagnostic evaluation.   I have reviewed the contents of history and physical exam as documented by PA/ARNP/Resident and agree with above documentation.  I have discussed and formulated the above plan as documented. Edits to the note have been made as needed.  Impression/Key exam findings/Plan: Personally viewed MRI  of the brain without contrast which demonstrates embolic appearing infarcts.  Will need full stroke workup but high suspicion for an underlying cardioembolic source.  Makaelah Cranfield, MD [facility] [phone]   If 7pm to 7am, please call on call as listed on AMION.

## 2023-09-10 NOTE — ED Notes (Signed)
 Received report from previous RN.  Pt resting, denies needs. Call bell within reach.

## 2023-09-10 NOTE — ED Notes (Signed)
 Attempted to call charge to make them aware patient was coming upstairs. Unable to get on the phone so I called the desk and told the secretary.

## 2023-09-11 ENCOUNTER — Inpatient Hospital Stay (HOSPITAL_BASED_OUTPATIENT_CLINIC_OR_DEPARTMENT_OTHER)

## 2023-09-11 DIAGNOSIS — R29700 NIHSS score 0: Secondary | ICD-10-CM | POA: Diagnosis not present

## 2023-09-11 DIAGNOSIS — I10 Essential (primary) hypertension: Secondary | ICD-10-CM

## 2023-09-11 DIAGNOSIS — E1151 Type 2 diabetes mellitus with diabetic peripheral angiopathy without gangrene: Secondary | ICD-10-CM | POA: Diagnosis not present

## 2023-09-11 DIAGNOSIS — H3412 Central retinal artery occlusion, left eye: Secondary | ICD-10-CM | POA: Diagnosis not present

## 2023-09-11 DIAGNOSIS — D329 Benign neoplasm of meninges, unspecified: Secondary | ICD-10-CM

## 2023-09-11 DIAGNOSIS — I6340 Cerebral infarction due to embolism of unspecified cerebral artery: Secondary | ICD-10-CM | POA: Diagnosis not present

## 2023-09-11 DIAGNOSIS — E785 Hyperlipidemia, unspecified: Secondary | ICD-10-CM | POA: Diagnosis not present

## 2023-09-11 DIAGNOSIS — I6389 Other cerebral infarction: Secondary | ICD-10-CM | POA: Diagnosis not present

## 2023-09-11 DIAGNOSIS — R29701 NIHSS score 1: Secondary | ICD-10-CM | POA: Diagnosis not present

## 2023-09-11 LAB — GLUCOSE, CAPILLARY
Glucose-Capillary: 119 mg/dL — ABNORMAL HIGH (ref 70–99)
Glucose-Capillary: 171 mg/dL — ABNORMAL HIGH (ref 70–99)
Glucose-Capillary: 157 mg/dL — ABNORMAL HIGH (ref 70–99)
Glucose-Capillary: 203 mg/dL — ABNORMAL HIGH (ref 70–99)

## 2023-09-11 LAB — ECHOCARDIOGRAM COMPLETE
Height: 60 in
S' Lateral: 2.2 cm
Weight: 2528 [oz_av]

## 2023-09-11 LAB — HEMOGLOBIN A1C
Hgb A1c MFr Bld: 6.6 % — ABNORMAL HIGH (ref 4.8–5.6)
Mean Plasma Glucose: 142.72 mg/dL

## 2023-09-11 LAB — LIPID PANEL
Cholesterol: 128 mg/dL (ref 0–200)
HDL: 52 mg/dL (ref >40)
LDL Cholesterol: 30 mg/dL (ref 0–99)
Total CHOL/HDL Ratio: 2.5 ratio
Triglycerides: 230 mg/dL — ABNORMAL HIGH (ref <150)
VLDL: 46 mg/dL — ABNORMAL HIGH (ref 0–40)

## 2023-09-11 NOTE — Plan of Care (Signed)

## 2023-09-11 NOTE — Evaluation (Signed)
 Occupational Therapy Evaluation Patient Details Name: Annette Gross MRN: [id number] DOB: [birth year] Today's Date: 09/11/2023   History of Present Illness   Pt is an 83 yo female presenting to [facility] 09/10/23 with acute vision loss in part of her L eye. MRI showed scattered small acute infarcts in the bilateral cerebral hemispheres. PMH of DM II, anxiety, cancer, DJD, DM, fibromyalgia, HTN, HLD, IBS     Clinical Impressions Pt admitted for above, PTA pt was independent with mobility and ADLs/iADLs, driving as well. Pt currently presenting with a L field cut, only able to see LUQ of L eye, R eye WFL. This is impacting pts baseline balance and ADL performance, currently needing min A to setup A for ADLs and CGA for ambulation, Educated pt on the use of scanning and anchoring strategies -provided handout. Also reviewed fall safety education. OT to continue following pt acutely to address listed deficits and help transition to next level of care. Patient would benefit from post acute Home OT services to help maximize functional independence in natural environment as well as follow-up with Outpatient Neuro ophthalmologist.       If plan is discharge home, recommend the following:   A little help with bathing/dressing/bathroom;Assistance with cooking/housework;Assist for transportation;Help with stairs or ramp for entrance     Functional Status Assessment   Patient has had a recent decline in their functional status and/or demonstrates limited ability to make significant improvements in function in a reasonable and predictable amount of time     Equipment Recommendations   Other (comment) (would benefit from grab bars in bathroom)     Recommendations for Other Services         Precautions/Restrictions   Precautions Precautions: Fall Recall of Precautions/Restrictions: Intact Restrictions Weight Bearing Restrictions Per Provider Order: No     Mobility Bed Mobility Overal bed  mobility: Needs Assistance Bed Mobility: Supine to Sit     Supine to sit: Contact guard     General bed mobility comments: pt left sitting in recliner    Transfers Overall transfer level: Needs assistance Equipment used: None Transfers: Sit to/from Stand Sit to Stand: Contact guard assist                  Balance Overall balance assessment: Needs assistance Sitting-balance support: No upper extremity supported, Feet supported Sitting balance-Leahy Scale: Fair       Standing balance-Leahy Scale: Fair Standing balance comment: has a R limp                           ADL either performed or assessed with clinical judgement   ADL Overall ADL's : Needs assistance/impaired Eating/Feeding: Set up;Sitting Eating/Feeding Details (indicate cue type and reason): for field deficits Grooming: Standing;Contact guard assist   Upper Body Bathing: Sitting;Minimal assistance   Lower Body Bathing: Sitting/lateral leans;Contact guard assist   Upper Body Dressing : Sitting;Minimal assistance   Lower Body Dressing: Sitting/lateral leans;Minimal assistance   Toilet Transfer: Contact guard assist;Ambulation   Toileting- Clothing Manipulation and Hygiene: Sitting/lateral lean;Contact guard assist       Functional mobility during ADLs: Contact guard assist General ADL Comments: Pt needing assist secondary to L sided visual deficts.     Vision Baseline Vision/History: 1 Wears glasses Ability to See in Adequate Light: 1 Impaired Patient Visual Report: Peripheral vision impairment;Central vision impairment Vision Assessment?: Yes Eye Alignment: Within Functional Limits Ocular Range of Motion: Within Functional Limits Alignment/Gaze Preference: Within  Defined Limits Tracking/Visual Pursuits: Decreased smoothness of eye movement to LEFT superior field Saccades:  (NT) Convergence: Impaired (comment) (L eye not converging) Visual Fields: Left visual field  deficit Additional Comments: upon confrontation testing pt found to have a cut in all fields of the L eye other than the LUQ, R eye without challenge identifying # of therapists fingers.     Perception Perception: Not tested       Praxis Praxis: WFL       Pertinent Vitals/Pain Pain Assessment Pain Assessment: Faces Faces Pain Scale: Hurts little more Pain Location: back and L shoulder Pain Descriptors / Indicators: Constant Pain Intervention(s): Limited activity within patient's tolerance, Monitored during session     Extremity/Trunk Assessment Upper Extremity Assessment Upper Extremity Assessment: LUE deficits/detail;Overall [facility] for tasks assessed;Right hand dominant LUE Deficits / Details: decreased coordination with rapid alt movements and mild finger opposition difficutly. strength overall 4/5 MMT, in-hand manipulation of 3 objects no challenge. LUE Sensation: WNL LUE Coordination:  (see above, mild coordination deficits)   Lower Extremity Assessment Lower Extremity Assessment: Defer to PT evaluation;RLE deficits/detail RLE Deficits / Details: shorter than LLE       Communication Communication Communication: No apparent difficulties   Cognition Arousal: Alert Behavior During Therapy: WFL for tasks assessed/performed Cognition: No apparent impairments                               Following commands: Intact       Cueing  General Comments   Cueing Techniques: Verbal cues  Pt HR up to 142 bpm with activity, in afib.   Exercises     Shoulder Instructions      Home Living Family/patient expects to be discharged to:: Private residence Living Arrangements: Children;Other relatives (son and the son's family) Available Help at Discharge: Family;Available 24 hours/day Type of Home: House Home Access: Stairs to enter [employer] of Steps: 5 Entrance Stairs-Rails: Right;Left Home Layout: Able to live on main level with  bedroom/bathroom;Two level;Full bath on main level;Laundry or work area in Artist of Steps: does not go up them   Bathroom Shower/Tub: Chief Strategy Officer: Standard (next to the vanity)     Home Equipment: Shower seat   Additional Comments: 1 fall 3 months ago      Prior Functioning/Environment Prior Level of Function : Independent/Modified Independent;Driving;History of Falls (last six months)             Mobility Comments: ind no AD in home, uses SPC sometimes in community ADLs Comments: ind; manages her own medicaitios    OT Problem List: Impaired vision/perception   OT Treatment/Interventions: Self-care/ADL training;Visual/perceptual remediation/compensation;Therapeutic activities      OT Goals(Current goals can be found in the care plan section)   Acute Rehab OT Goals Patient Stated Goal: To get vision better OT Goal Formulation: With patient Time For Goal Achievement: 09/25/23 Potential to Achieve Goals: Good ADL Goals Pt Will Perform Grooming: with modified independence;sitting Pt Will Transfer to Toilet: with modified independence;ambulating Additional ADL Goal #1: Pt will utilized compensatory visual strategies to locate 4/4 objects on L side in prep for ADLs. Additional ADL Goal #3: Pt will verbalize understanding of fall prevention education.   OT Frequency:  Min 2X/week    Co-evaluation              AM-PAC OT "6 Clicks" Daily Activity     Outcome Measure Help  from another person eating meals?: A Little Help from another person taking care of personal grooming?: A Little Help from another person toileting, which includes using toliet, bedpan, or urinal?: A Little Help from another person bathing (including washing, rinsing, drying)?: A Little Help from another person to put on and taking off regular upper body clothing?: A Little Help from another person to put on and taking off regular lower body  clothing?: A Little 6 Click Score: 18   End of Session Equipment Utilized During Treatment: Gait belt Nurse Communication: Mobility status  Activity Tolerance: Patient tolerated treatment well Patient left: in chair;with call bell/phone within reach;with chair alarm set  OT Visit Diagnosis: Low vision, both eyes (H54.2)                Time: 1130-1159 OT Time Calculation (min): 29 min Charges:  OT General Charges $OT Visit: 1 Visit OT Evaluation $OT Eval Low Complexity: 1 Low OT Treatments $Therapeutic Activity: 8-22 mins  09/11/2023  AB, OTR/L  Acute Rehabilitation Services  Office: [phone]   Jorene New 09/11/2023, 2:41 PM

## 2023-09-11 NOTE — Progress Notes (Signed)
 PROGRESS NOTE    Annette Gross  WUJ:[id number] DOB: [birth year] DOA: 09/10/2023 PCP: Karalee Oscar, PA   83/F with history of type 2 diabetes mellitus, hypertension, GERD, factor V Leiden mutation, hypothyroidism, depression, macular degeneration presented to the ED with acute onset painless vision loss in part of her left eye, she was in her usual state of health when she went to bed last night.  Presented to the local ophthalmologist who directed her to the ER.  ED Course: Vital signs stable, labs largely unremarkable, thrombocytosis, MRI noted extensive bilateral embolic appearing infarcts   Subjective: - Feels fair, vision in left eye seems to be improving  Assessment and Plan:  Acute embolic stroke - Neurology consulting, MRI noted bilateral extensive infarcts - Starting on aspirin and Plavix daily - EKG with ectopic atrial beats - Will likely need event monitor or loop recorder -Follow-up echo today, MRA without large vessel occlusion - LD L is 30, A1c 6.6 - PT OT, SLP eval     HTN - Holding losartan for permissive hypertension    DM 2 -hold metformin , SSI, A1c is 6.6   History of hypothyroidism Continue Synthroid      DVT prophylaxis: lovenox  Code Status: FUll Code Family Communication: none present  Consultants:    Procedures:   Antimicrobials:    Objective: Vitals:   09/11/23 0051 09/11/23 0428 09/11/23 0758 09/11/23 1129  BP: 134/82 (!) 148/57 (!) 124/90 130/76  Pulse: 93 97 90 88  Resp:   17 18  Temp: 98.5 F (36.9 C) 98.4 F (36.9 C) 98.3 F (36.8 C) 98 F (36.7 C)  TempSrc: Axillary Oral Oral Oral  SpO2: 95% 93% 95% 94%  Weight:      Height:        Intake/Output Summary (Last 24 hours) at 09/11/2023 1138 Last data filed at 09/11/2023 0939 Gross per 24 hour  Intake 453 ml  Output --  Net 453 ml   Filed Weights   09/10/23 1154  Weight: 71.7 kg    Examination:  General exam: Appears calm and comfortable  Respiratory system: Clear to  auscultation Cardiovascular system: S1 & S2 heard, RRR.  Abd: nondistended, soft and nontender.Normal bowel sounds heard. Central nervous system: Alert and oriented.,  Some visual field deficits in left eye, no other focal findings Extremities: no edema Skin: No rashes Psychiatry:  Mood & affect appropriate.     Data Reviewed:   CBC: Recent Labs  Lab 09/10/23 1223 09/10/23 1226 09/10/23 1929  WBC  --  9.4 11.4*  NEUTROABS  --  4.8  --   HGB 16.0* 15.0 13.7  HCT 47.0* 44.4 41.2  MCV  --  89.2 89.6  PLT  --  481* 433*   Basic Metabolic Panel: Recent Labs  Lab 09/10/23 1223 09/10/23 1226 09/10/23 1929  NA 134* 135  --   K 4.1 4.3  --   CL 98 98  --   CO2  --  22  --   GLUCOSE 144* 139*  --   BUN 13 13  --   CREATININE 0.60 0.67 0.70  CALCIUM   --  9.1  --    GFR: Estimated Creatinine Clearance: 47.9 mL/min (by C-G formula based on SCr of 0.7 mg/dL). Liver Function Tests: Recent Labs  Lab 09/10/23 1226  AST 19  ALT 10  ALKPHOS 84  BILITOT 0.6  PROT 6.8  ALBUMIN 3.6   No results for input(s): "LIPASE", "AMYLASE" in the last 168 hours. No results  for input(s): "AMMONIA" in the last 168 hours. Coagulation Profile: Recent Labs  Lab 09/10/23 1226  INR 1.0   Cardiac Enzymes: No results for input(s): "CKTOTAL", "CKMB", "CKMBINDEX", "TROPONINI" in the last 168 hours. BNP (last 3 results) No results for input(s): "PROBNP" in the last 8760 hours. HbA1C: Recent Labs    09/11/23 0811  HGBA1C 6.6*   CBG: Recent Labs  Lab 09/10/23 1249 09/10/23 1754 09/10/23 2104 09/11/23 0622 09/11/23 1130  GLUCAP 148* 158* 199* 119* 171*   Lipid Profile: Recent Labs    09/11/23 0811  CHOL 128  HDL 52  LDLCALC 30  TRIG 230*  CHOLHDL 2.5   Thyroid  Function Tests: No results for input(s): "TSH", "T4TOTAL", "FREET4", "T3FREE", "THYROIDAB" in the last 72 hours. Anemia Panel: No results for input(s): "VITAMINB12", "FOLATE", "FERRITIN", "TIBC", "IRON",  "RETICCTPCT" in the last 72 hours. Urine analysis:    Component Value Date/Time   COLORURINE YELLOW 01/29/2023 2252   APPEARANCEUR CLOUDY (A) 01/29/2023 2252   LABSPEC 1.036 (H) 01/29/2023 2252   PHURINE 5.5 01/29/2023 2252   GLUCOSEU NEGATIVE 01/29/2023 2252   HGBUR NEGATIVE 01/29/2023 2252   BILIRUBINUR NEGATIVE 01/29/2023 2252   KETONESUR 15 (A) 01/29/2023 2252   PROTEINUR 100 (A) 01/29/2023 2252   UROBILINOGEN 0.2 07/23/2007 0932   NITRITE NEGATIVE 01/29/2023 2252   LEUKOCYTESUR MODERATE (A) 01/29/2023 2252   Sepsis Labs: @LABRCNTIP (procalcitonin:4,lacticidven:4)  )No results found for this or any previous visit (from the past 240 hours).   Radiology Studies: MR ANGIO HEAD WO CONTRAST Result Date: 09/10/2023 CLINICAL DATA:  Follow-up examination for acute stroke. EXAM: MRA HEAD WITHOUT CONTRAST TECHNIQUE: Angiographic images of the Circle of Willis were acquired using MRA technique without intravenous contrast. COMPARISON:  Prior brain MRI from earlier the same day. FINDINGS: Anterior circulation: Both internal carotid arteries are patent through the siphons without stenosis or other abnormality. A1 segments patent bilaterally. Right A1 is hypoplastic, accounting for the diminutive right ICA is compared to the left. Normal anterior communicating artery complex. Both ACAs patent to their distal aspects without significant stenosis. Tiny outpouching arising from the proximal A2 segment noted, which appears to be consistent with a small vascular infundibulum on 3D source time-of-flight sequence. No M1 stenosis or occlusion. Distal MCA branches perfused and symmetric. Posterior circulation: Both V4 segments patent without stenosis. Both PICA patent. Basilar patent without stenosis. Superior cerebral arteries patent bilaterally. Left PCA supplied via the basilar. Fetal type origin of the right PCA. Both PCAs patent without stenosis. Anatomic variants: As above. Other: None. IMPRESSION: Negative  intracranial MRA. No large vessel occlusion or hemodynamically significant stenosis. Electronically Signed   By: Virgia Griffins M.D.   On: 09/10/2023 20:23   MR ORBITS W WO CONTRAST Result Date: 09/10/2023 CLINICAL DATA:  Initial evaluation for acute left-sided visual loss. EXAM: MRI OF THE ORBITS WITHOUT AND WITH CONTRAST TECHNIQUE: Multiplanar, multi-echo pulse sequences of the orbits and surrounding structures were acquired including fat saturation techniques, before and after intravenous contrast administration. CONTRAST:  7mL GADAVIST GADOBUTROL 1 MMOL/ML IV SOLN COMPARISON:  Prior brain MRI from earlier the same day. FINDINGS: Orbits: Globes are symmetric in size with normal appearance and morphology. Prior bilateral ocular lens replacement. Optic nerves fairly symmetric and within normal limits. No abnormal edema or enhancement about either optic nerve or optic nerve sheath complex. Intraconal and extraconal fat maintained. Extra-ocular muscles symmetric and within normal limits. Lacrimal glands within normal limits. No abnormality about the cavernous sinus or suprasellar cistern. Visualized sinuses: Mild  mucosal thickening present about the sphenoethmoidal sinuses. Paranasal sinuses are otherwise clear. Left-to-right nasal septal deviation with associated concha bullosa. Soft tissues: Periorbital soft tissues within normal limits. Limited intracranial: Atrophy with chronic small vessel ischemic disease, described on prior brain MRI. 8 mm meningioma overlying the anterior right frontal convexity without mass effect. IMPRESSION: 1. Normal MRI of the orbits. 2. 8 mm meningioma overlying the anterior right frontal convexity without mass effect. 3. Underlying atrophy with chronic small vessel ischemic disease. Electronically Signed   By: Virgia Griffins M.D.   On: 09/10/2023 20:20   MR BRAIN WO CONTRAST Result Date: 09/10/2023 CLINICAL DATA:  Provided history: Vision loss, monocular. Left eye  vision loss. Concern for CRAO. EXAM: MRI HEAD WITHOUT CONTRAST TECHNIQUE: Multiplanar, multiecho pulse sequences of the brain and surrounding structures were obtained without intravenous contrast. COMPARISON:  Head CT 09/10/2023. FINDINGS: Brain: Generalized cerebral atrophy. There are multiple small acute infarcts scattered within the bilateral cerebral hemispheres, 10-15 in number. The largest is an 8 mm acute cortically-based infarct within the right parietal lobe (series 2, image 33). 8 mm dural-based mass overlying the anteromedial right frontal lobe most consistent with a meningioma (series 7, image 20). The mass slightly indents the underlying brain parenchyma. No underlying parenchymal edema. Multifocal T2 FLAIR hyperintense signal abnormality within the cerebral white matter and pons, nonspecific but compatible with moderate chronic small vessel ischemic disease. Punctate chronic microhemorrhage within the right lentiform nucleus. Prominent perivascular spaces with within the bilateral cerebral hemispheric white matter and within/about the bilateral basal ganglia. Cavum septum pellucidum and cavum vergae (anatomic variant). No extra-axial fluid collection. No midline shift. Vascular: Maintained flow voids within the proximal large arterial vessels. Skull and upper cervical spine: No focal worrisome marrow lesion. Incompletely assessed cervical spondylosis. Sinuses/Orbits: No mass or acute finding within the imaged orbits. No significant paranasal sinus disease. IMPRESSION: 1. Multiple small acute infarcts scattered within the bilateral cerebral hemispheres, 10-15 in number and measuring up to 8 mm. Given involvement of multiple vascular territories, findings suggest sequelae of an embolic process. 2. 8 mm meningioma overlying the anterior right frontal lobe. The mass slightly indents the underlying brain parenchyma without underlying parenchymal edema. 3. Background parenchymal atrophy and chronic small  vessel ischemic disease. Electronically Signed   By: Bascom Lily D.O.   On: 09/10/2023 15:28   CT HEAD WO CONTRAST Result Date: 09/10/2023 CLINICAL DATA:  Neuro deficit, acute, stroke suspected. EXAM: CT HEAD WITHOUT CONTRAST TECHNIQUE: Contiguous axial images were obtained from the base of the skull through the vertex without intravenous contrast. RADIATION DOSE REDUCTION: This exam was performed according to the departmental dose-optimization program which includes automated exposure control, adjustment of the mA and/or kV according to patient size and/or use of iterative reconstruction technique. COMPARISON:  None Available. FINDINGS: Brain: Age related volume loss. Chronic small-vessel ischemic changes of the white matter and basal ganglia. Probable small vessel change of the pons. No evidence of acute infarction, mass lesion, hemorrhage, hydrocephalus or extra-axial collection. Vascular: There is atherosclerotic calcification of the major vessels at the base of the brain. Skull: Negative Sinuses/Orbits: Clear/normal Other: None IMPRESSION: No acute CT finding. Age related volume loss. Chronic small-vessel ischemic changes of the white matter and basal ganglia. Electronically Signed   By: Bettylou Brunner M.D.   On: 09/10/2023 14:20     Scheduled Meds:   stroke: early stages of recovery book   Does not apply Once   aspirin EC  81 mg Oral Daily  clopidogrel  75 mg Oral Daily   DULoxetine   60 mg Oral QHS   enoxaparin  (LOVENOX ) injection  40 mg Subcutaneous QHS   insulin  aspart  0-9 Units Subcutaneous TID WC   levothyroxine   88 mcg Oral Q0600   pantoprazole   40 mg Oral QHS   sodium chloride  flush  3 mL Intravenous Once   sodium chloride  flush  3-10 mL Intravenous Q12H   Continuous Infusions:   LOS: 1 day    Time spent:    Deforest Fast, MD [facility]   09/11/2023, 11:38 AM

## 2023-09-11 NOTE — Progress Notes (Signed)
  Echocardiogram 2D Echocardiogram has been performed.  Fain Home RDCS 09/11/2023, 2:11 PM

## 2023-09-11 NOTE — Plan of Care (Signed)
  Problem: Self-Care: Goal: Ability to participate in self-care as condition permits will improve Outcome: Progressing Goal: Verbalization of feelings and concerns over difficulty with self-care will improve Outcome: Progressing Goal: Ability to communicate needs accurately will improve Outcome: Progressing   Problem: Nutrition: Goal: Risk of aspiration will decrease Outcome: Progressing Goal: Dietary intake will improve Outcome: Progressing   Problem: Clinical Measurements: Goal: Ability to maintain clinical measurements within normal limits will improve Outcome: Progressing Goal: Will remain free from infection Outcome: Progressing Goal: Diagnostic test results will improve Outcome: Progressing Goal: Respiratory complications will improve Outcome: Progressing Goal: Cardiovascular complication will be avoided Outcome: Progressing   Problem: Activity: Goal: Risk for activity intolerance will decrease Outcome: Progressing   Problem: Pain Managment: Goal: General experience of comfort will improve and/or be controlled Outcome: Progressing   Problem: Safety: Goal: Ability to remain free from injury will improve Outcome: Progressing   Problem: Skin Integrity: Goal: Risk for impaired skin integrity will decrease Outcome: Progressing

## 2023-09-11 NOTE — Evaluation (Signed)
 Physical Therapy Evaluation Patient Details Name: Annette Gross MRN: [id number] DOB: 02-13-1941 Today's Date: 09/11/2023  History of Present Illness  Pt is an 83 yo female presenting to [facility] 09/10/23 with acute vision loss in part of her L eye. MRI showed scattered small acute infarcts in the bilateral cerebral hemispheres. PMH of DM II, anxiety, cancer, DJD, DM, fibromyalgia, HTN, HLD, IBS  Clinical Impression  Pt is close to her baseline, even with the new visual deficits.  She reports she is "95%" back to her normal self and when asked what the 5% was, she does not report her vision, she reports her back pain from being in the hospital bed.  Pt is interested in follow up outpatient therapy and I would encourage her to go.  She is a high risk of falls, especially without her assistive device (which she reports she does not use inside the home).   PT to follow acutely for deficits listed below.       If plan is discharge home, recommend the following: A little help with walking and/or transfers;A little help with bathing/dressing/bathroom;Assist for transportation;Help with stairs or ramp for entrance;Assistance with cooking/housework   Can travel by private vehicle        Equipment Recommendations None recommended by PT  Recommendations for Other Services       Functional Status Assessment Patient has had a recent decline in their functional status and demonstrates the ability to make significant improvements in function in a reasonable and predictable amount of time.     Precautions / Restrictions Precautions Precautions: Fall Recall of Precautions/Restrictions: Intact      Mobility  Bed Mobility Overal bed mobility: Needs Assistance Bed Mobility: Supine to Sit     Supine to sit: Supervision     General bed mobility comments: supervision for safety, especially getting back in the bed as she crawled forward instead of sitting down and getting her feet in    Transfers Overall  transfer level: Needs assistance Equipment used: Rolling walker (2 wheels) Transfers: Sit to/from Stand Sit to Stand: Contact guard assist           General transfer comment: for safety during transitions    Ambulation/Gait Ambulation/Gait assistance: Contact guard assist Gait Distance (Feet): 250 Feet Assistive device: Rolling walker (2 wheels) Gait Pattern/deviations: Step-through pattern       General Gait Details: vaulting gait patterm due to leg length discrepancey  Stairs Stairs: Yes Stairs assistance: Supervision Stair Management: Two rails, Alternating pattern, Forwards Number of Stairs: 5 General stair comments: pt able to ascend and descend stairs with supervision as long as she had the railings for support.  Wheelchair Mobility     Tilt Bed    Modified Rankin (Stroke Patients Only) Modified Rankin (Stroke Patients Only) Pre-Morbid Rankin Score: Moderate disability Modified Rankin: Moderate disability     Balance Overall balance assessment: Needs assistance Sitting-balance support: No upper extremity supported, Feet supported Sitting balance-Leahy Scale: Fair       Standing balance-Leahy Scale: Fair Standing balance comment: reliant on UE support for dynamic balance                             Pertinent Vitals/Pain Pain Assessment Pain Assessment: Faces Faces Pain Scale: Hurts little more Pain Location: back and L shoulder Pain Descriptors / Indicators: Constant Pain Intervention(s): Limited activity within patient's tolerance, Monitored during session, Repositioned    Home Living Family/patient expects to  be discharged to:: Private residence Living Arrangements: Children;Other relatives Available Help at Discharge: Family;Available 24 hours/day Type of Home: House Home Access: Stairs to enter Entrance Stairs-Rails: Right;Left Entrance Stairs-Number of Steps: 5 Alternate Level Stairs-Number of Steps: does not go up them Home  Layout: Able to live on main level with bedroom/bathroom;Two level;Full bath on main level;Laundry or work area in [employer] Equipment: Environmental education officer Comments: 1 fall 3 months ago    Prior Function Prior Level of Function : Independent/Modified Independent;Driving;History of Falls (last six months)             Mobility Comments: ind no AD in home, uses SPC sometimes in community ADLs Comments: ind; manages her own medicaitios     Extremity/Trunk Assessment   Upper Extremity Assessment Upper Extremity Assessment: Defer to OT evaluation LUE Deficits / Details: decreased coordination with rapid alt movements and mild finger opposition difficutly. strength overall 4/5 MMT, in-hand manipulation of 3 objects no challenge. LUE Sensation: WNL LUE Coordination:  (see above, mild coordination deficits)    Lower Extremity Assessment Lower Extremity Assessment: Generalized weakness RLE Deficits / Details: R LE is shorter than L LE and has been this way her whole life.  She never had a shoe lift       Communication   Communication Communication: No apparent difficulties    Cognition Arousal: Alert Behavior During Therapy: WFL for tasks assessed/performed   PT - Cognitive impairments: No apparent impairments                         Following commands: Intact       Cueing Cueing Techniques: Verbal cues     General Comments General comments (skin integrity, edema, etc.): Pt HR up to 142 bpm with activity, in afib.    Exercises     Assessment/Plan    PT Assessment Patient needs continued PT services  PT Problem List Decreased strength;Decreased activity tolerance;Decreased balance;Decreased mobility;Decreased knowledge of use of DME;Pain       PT Treatment Interventions DME instruction;Stair training;Gait training;Functional mobility training;Therapeutic activities;Balance training;Therapeutic exercise;Patient/family education    PT Goals (Current  goals can be found in the Care Plan section)  Acute Rehab PT Goals PT Goal Formulation: With patient Time For Goal Achievement: 09/25/23 Potential to Achieve Goals: Good    Frequency Min 1X/week     Co-evaluation               AM-PAC PT "6 Clicks" Mobility  Outcome Measure Help needed turning from your back to your side while in a flat bed without using bedrails?: None Help needed moving from lying on your back to sitting on the side of a flat bed without using bedrails?: A Little Help needed moving to and from a bed to a chair (including a wheelchair)?: A Little Help needed standing up from a chair using your arms (e.g., wheelchair or bedside chair)?: A Little Help needed to walk in hospital room?: A Little Help needed climbing 3-5 steps with a railing? : A Little 6 Click Score: 19    End of Session Equipment Utilized During Treatment: Gait belt Activity Tolerance: Patient tolerated treatment well Patient left: in bed;with call bell/phone within reach;with chair alarm set Nurse Communication: Mobility status PT Visit Diagnosis: Muscle weakness (generalized) (M62.81);Difficulty in walking, not elsewhere classified (R26.2);Pain Pain - Right/Left:  (back) Pain - part of body:  (back)    Time: 0981-1914 PT Time Calculation (min) (ACUTE ONLY): 15  min   Charges:   PT Evaluation $PT Eval Moderate Complexity: 1 Mod   PT General Charges $$ ACUTE PT VISIT: 1 Visit        Alveria Avena, PT, DPT  Acute Rehabilitation Secure chat is best for contact #[phone] office    09/11/2023, 5:38 PM

## 2023-09-11 NOTE — Progress Notes (Addendum)
 STROKE TEAM PROGRESS NOTE    INTERIM HISTORY/SUBJECTIVE  Patient reports that vision in her left eye is improving and she is able to count fingers now.  She has been hemodynamically stable and afebrile overnight.  OBJECTIVE  CBC    Component Value Date/Time   WBC 11.4 (H) 09/10/2023 1929   RBC 4.60 09/10/2023 1929   HGB 13.7 09/10/2023 1929   HCT 41.2 09/10/2023 1929   PLT 433 (H) 09/10/2023 1929   MCV 89.6 09/10/2023 1929   MCH 29.8 09/10/2023 1929   MCHC 33.3 09/10/2023 1929   RDW 13.6 09/10/2023 1929   LYMPHSABS 3.2 09/10/2023 1226   MONOABS 1.1 (H) 09/10/2023 1226   EOSABS 0.2 09/10/2023 1226   BASOSABS 0.1 09/10/2023 1226    BMET    Component Value Date/Time   NA 135 09/10/2023 1226   K 4.3 09/10/2023 1226   CL 98 09/10/2023 1226   CO2 22 09/10/2023 1226   GLUCOSE 139 (H) 09/10/2023 1226   BUN 13 09/10/2023 1226   CREATININE 0.70 09/10/2023 1929   CALCIUM  9.1 09/10/2023 1226   GFRNONAA >60 09/10/2023 1929    IMAGING past 24 hours ECHOCARDIOGRAM COMPLETE Result Date: 09/11/2023    ECHOCARDIOGRAM REPORT   Patient Name:   Annette Gross Date of Exam: 09/11/2023 Medical Rec #:  [id number]     Height:       60.0 in Accession #:    [id number]    Weight:       158.0 lb Date of Birth:  04/27/1941      BSA:          1.689 m Patient Age:    83 years      BP:           124/90 mmHg Patient Gender: F             HR:           88 bpm. Exam Location:  Inpatient Procedure: 2D Echo, Color Doppler and Cardiac Doppler (Both Spectral and Color            Flow Doppler were utilized during procedure). Indications:    Stroke I63.9  History:        Patient has no prior history of Echocardiogram examinations.  Sonographer:    Hersey Lorenzo RDCS Referring Phys: Leandro Proffer [place] IMPRESSIONS  1. Left ventricular ejection fraction, by estimation, is 60 to 65%. The left ventricle has normal function. The left ventricle has no regional wall motion abnormalities. There is mild concentric left  ventricular hypertrophy. Left ventricular diastolic parameters are indeterminate.  2. Right ventricular systolic function is mildly reduced. The right ventricular size is mildly enlarged.  3. A small pericardial effusion is present. The pericardial effusion is circumferential.  4. The mitral valve is normal in structure. No evidence of mitral valve regurgitation. No evidence of mitral stenosis.  5. The aortic valve is calcified. There is mild calcification of the aortic valve. There is moderate thickening of the aortic valve. Aortic valve regurgitation is not visualized. No aortic stenosis is present.  6. The inferior vena cava is normal in size with greater than 50% respiratory variability, suggesting right atrial pressure of 3 mmHg. FINDINGS  Left Ventricle: Left ventricular ejection fraction, by estimation, is 60 to 65%. The left ventricle has normal function. The left ventricle has no regional wall motion abnormalities. The left ventricular internal cavity size was normal in size. There is  mild concentric left ventricular hypertrophy.  Left ventricular diastolic parameters are indeterminate. Right Ventricle: The right ventricular size is mildly enlarged. No increase in right ventricular wall thickness. Right ventricular systolic function is mildly reduced. Left Atrium: Left atrial size was normal in size. Right Atrium: Right atrial size was normal in size. Pericardium: A small pericardial effusion is present. The pericardial effusion is circumferential. Mitral Valve: The mitral valve is normal in structure. There is mild thickening of the mitral valve leaflet(s). No evidence of mitral valve regurgitation. No evidence of mitral valve stenosis. Tricuspid Valve: The tricuspid valve is normal in structure. Tricuspid valve regurgitation is not demonstrated. No evidence of tricuspid stenosis. Aortic Valve: The aortic valve is calcified. There is mild calcification of the aortic valve. There is moderate thickening of  the aortic valve. There is mild aortic valve annular calcification. Aortic valve regurgitation is not visualized. No aortic stenosis is present. Pulmonic Valve: The pulmonic valve was normal in structure. Pulmonic valve regurgitation is not visualized. No evidence of pulmonic stenosis. Aorta: The aortic root is normal in size and structure. Venous: The inferior vena cava is normal in size with greater than 50% respiratory variability, suggesting right atrial pressure of 3 mmHg. IAS/Shunts: No atrial level shunt detected by color flow Doppler.  LEFT VENTRICLE PLAX 2D LVIDd:         3.60 cm   Diastology LVIDs:         2.20 cm   LV e' medial:  6.20 cm/s LV PW:         1.00 cm   LV e' lateral: 6.74 cm/s LV IVS:        1.10 cm LVOT diam:     1.90 cm LV SV:         38 LV SV Index:   23 LVOT Area:     2.84 cm  RIGHT VENTRICLE            IVC RV S prime:     7.83 cm/s  IVC diam: 1.30 cm TAPSE (M-mode): 1.2 cm LEFT ATRIUM           Index LA diam:      4.50 cm 2.66 cm/m LA Vol (A2C): 18.6 ml 11.01 ml/m LA Vol (A4C): 34.1 ml 20.19 ml/m  AORTIC VALVE LVOT Vmax:   67.10 cm/s LVOT Vmean:  48.700 cm/s LVOT VTI:    0.135 m  AORTA Ao Root diam: 2.80 cm Ao Asc diam:  3.30 cm  SHUNTS Systemic VTI:  0.14 m Systemic Diam: 1.90 cm Kardie Tobb DO Electronically signed by Jerryl Morin DO Signature Date/Time: 09/11/2023/3:13:49 PM    Final    MR ANGIO HEAD WO CONTRAST Result Date: 09/10/2023 CLINICAL DATA:  Follow-up examination for acute stroke. EXAM: MRA HEAD WITHOUT CONTRAST TECHNIQUE: Angiographic images of the Circle of Willis were acquired using MRA technique without intravenous contrast. COMPARISON:  Prior brain MRI from earlier the same day. FINDINGS: Anterior circulation: Both internal carotid arteries are patent through the siphons without stenosis or other abnormality. A1 segments patent bilaterally. Right A1 is hypoplastic, accounting for the diminutive right ICA is compared to the left. Normal anterior communicating artery  complex. Both ACAs patent to their distal aspects without significant stenosis. Tiny outpouching arising from the proximal A2 segment noted, which appears to be consistent with a small vascular infundibulum on 3D source time-of-flight sequence. No M1 stenosis or occlusion. Distal MCA branches perfused and symmetric. Posterior circulation: Both V4 segments patent without stenosis. Both PICA patent. Basilar patent without stenosis. Superior  cerebral arteries patent bilaterally. Left PCA supplied via the basilar. Fetal type origin of the right PCA. Both PCAs patent without stenosis. Anatomic variants: As above. Other: None. IMPRESSION: Negative intracranial MRA. No large vessel occlusion or hemodynamically significant stenosis. Electronically Signed   By: Virgia Griffins M.D.   On: 09/10/2023 20:23   MR ORBITS W WO CONTRAST Result Date: 09/10/2023 CLINICAL DATA:  Initial evaluation for acute left-sided visual loss. EXAM: MRI OF THE ORBITS WITHOUT AND WITH CONTRAST TECHNIQUE: Multiplanar, multi-echo pulse sequences of the orbits and surrounding structures were acquired including fat saturation techniques, before and after intravenous contrast administration. CONTRAST:  7mL GADAVIST GADOBUTROL 1 MMOL/ML IV SOLN COMPARISON:  Prior brain MRI from earlier the same day. FINDINGS: Orbits: Globes are symmetric in size with normal appearance and morphology. Prior bilateral ocular lens replacement. Optic nerves fairly symmetric and within normal limits. No abnormal edema or enhancement about either optic nerve or optic nerve sheath complex. Intraconal and extraconal fat maintained. Extra-ocular muscles symmetric and within normal limits. Lacrimal glands within normal limits. No abnormality about the cavernous sinus or suprasellar cistern. Visualized sinuses: Mild mucosal thickening present about the sphenoethmoidal sinuses. Paranasal sinuses are otherwise clear. Left-to-right nasal septal deviation with associated concha  bullosa. Soft tissues: Periorbital soft tissues within normal limits. Limited intracranial: Atrophy with chronic small vessel ischemic disease, described on prior brain MRI. 8 mm meningioma overlying the anterior right frontal convexity without mass effect. IMPRESSION: 1. Normal MRI of the orbits. 2. 8 mm meningioma overlying the anterior right frontal convexity without mass effect. 3. Underlying atrophy with chronic small vessel ischemic disease. Electronically Signed   By: Virgia Griffins M.D.   On: 09/10/2023 20:20    Vitals:   09/11/23 0051 09/11/23 0428 09/11/23 0758 09/11/23 1129  BP: 134/82 (!) 148/57 (!) 124/90 130/76  Pulse: 93 97 90 88  Resp:   17 18  Temp: 98.5 F (36.9 C) 98.4 F (36.9 C) 98.3 F (36.8 C) 98 F (36.7 C)  TempSrc: Axillary Oral Oral Oral  SpO2: 95% 93% 95% 94%  Weight:      Height:         PHYSICAL EXAM General:  Alert, well-nourished, well-developed patient in no acute distress Psych:  Mood and affect appropriate for situation CV: Regular rate and rhythm on monitor Respiratory:  Regular, unlabored respirations on room air   NEURO:  Mental Status: AA&Ox3, patient is able to give clear and coherent history Speech/Language: speech is without dysarthria or aphasia.    Cranial Nerves:  II: PERRL. Visual fields full with both eyes open, able to count fingers in left eye today, although significant lower visual field deficit in left eye remains III, IV, VI: EOMI. Eyelids elevate symmetrically.  V: Sensation is intact to light touch and symmetrical to face.  VII: Face is symmetrical resting and smiling VIII: hearing intact to voice. IX, X: Palate elevates symmetrically. Phonation is normal.  NW:GNFAOZHY shrug 5/5. XII: tongue is midline without fasciculations. Motor: 5/5 strength to all muscle groups tested.  Tone: is normal and bulk is normal Sensation- Intact to light touch bilaterally.  Coordination: FTN intact bilaterally Gait-  deferred  Most Recent NIH 0   ASSESSMENT/PLAN  Ms. Annette Gross is a 83 y.o. female with history of asthma, diabetes, hypertension, GERD, factor V deficiency (elsewhere charted says patient has factor V Leiden but this is an error), thyroidectomy, depression, fibromyalgia, IBS, hyperlipidemia and peripheral neuropathy admitted for painless loss of vision in the central  field of the left eye.  She was seen by her ophthalmologist who diagnosed her with a CRAO and sent her to the emergency department for further evaluation.  On MRI, she was found to have multiple embolic appearing ischemic strokes.  NIH on Admission 1  Acute Ischemic Infarct: Left eye CRAO and multiple small scattered ischemic infarcts Etiology: Likely embolic CT head No acute abnormality. Small vessel disease. Atrophy.  MRI multiple small acute infarcts scattered within bilateral cerebral hemispheres, likely embolic in nature, 8 mm meningioma overlaying anterior right frontal lobe, background atrophy and chronic small vessel ischemic disease MRA no LVO or hemodynamically significant stenosis Carotid Doppler pending Bilateral lower extremity ultrasound pending 2D Echo EF 60 to 65%, mild concentric LVH, mild enlargement of right ventricle, small pericardial effusion, normal left atrial size, no atrial level shunt Consider loop recorder 30-day cardiac monitor at discharge Consider TEE LDL 30 HgbA1c 6.6 VTE prophylaxis -Lovenox  No antithrombotic prior to admission, now on aspirin 81 mg daily and clopidogrel 75 mg daily for 3 weeks and then aspirin alone. Therapy recommendations:  Home Health OT Disposition: pending  Hypertension Home meds: Losartan 25 mg daily Stable Blood Pressure Goal: BP less than 220/110   Hyperlipidemia Home meds: Repatha 140 mg every 2 weeks LDL 30, goal < 70 High intensity statin not indicated as LDL is below goal Continue statin at discharge  Other Stroke Risk Factors Obesity, Body mass index  is 30.86 kg/m., BMI >/= 30 associated with increased stroke risk, recommend weight loss, diet and exercise as appropriate    Other Active Problems None  Hospital day # 1  Patient seen by NP and then by MD, MD to edit note as needed. Cortney E Bucky Cardinal , MSN, AGACNP-BC [facility] See Amion for schedule and pager information 09/11/2023 3:23 PM  ATTENDING ATTESTATION:  83 year old with left CRAO with MRI suggestive of embolic stroke.  Recommend thorough workup for this reason.  Will need loop monitor.  Echo if unremarkable TEE.  Still with no vision in the left eye.  Continue DAPT therapy neurology continue to follow  Dr. Dahlia Dross evaluated pt independently, reviewed imaging, chart, labs. Discussed and formulated plan with the Resident/APP. Changes were made to the note where appropriate. Please see APP/resident note above for details.   Total 36 minutes spent on counseling patient and coordinating care, writing notes and reviewing chart.    Mckenlee Mangham,MD     To contact Stroke Continuity provider, please refer to [email or web address]. After hours, contact General Neurology

## 2023-09-12 ENCOUNTER — Encounter (HOSPITAL_COMMUNITY)

## 2023-09-12 DIAGNOSIS — R29701 NIHSS score 1: Secondary | ICD-10-CM | POA: Diagnosis not present

## 2023-09-12 DIAGNOSIS — E1151 Type 2 diabetes mellitus with diabetic peripheral angiopathy without gangrene: Secondary | ICD-10-CM | POA: Diagnosis not present

## 2023-09-12 DIAGNOSIS — H3412 Central retinal artery occlusion, left eye: Secondary | ICD-10-CM | POA: Diagnosis not present

## 2023-09-12 DIAGNOSIS — I6340 Cerebral infarction due to embolism of unspecified cerebral artery: Secondary | ICD-10-CM | POA: Diagnosis not present

## 2023-09-12 LAB — GLUCOSE, CAPILLARY
Glucose-Capillary: 128 mg/dL — ABNORMAL HIGH (ref 70–99)
Glucose-Capillary: 154 mg/dL — ABNORMAL HIGH (ref 70–99)
Glucose-Capillary: 136 mg/dL — ABNORMAL HIGH (ref 70–99)
Glucose-Capillary: 150 mg/dL — ABNORMAL HIGH (ref 70–99)

## 2023-09-12 MED ORDER — LOSARTAN POTASSIUM 25 MG PO TABS
25.0000 mg | ORAL_TABLET | Freq: Every day | ORAL | Status: DC
  Administered 2023-09-12 – 2023-09-13 (×2): 25 mg via ORAL
  Filled 2023-09-12 (×2): qty 1

## 2023-09-12 NOTE — Care Management CC44 (Signed)
 Condition Code 44 Documentation Completed  Patient Details  Name: Annette Gross MRN: [id number] Date of Birth: Aug 31, 1940   Condition Code 44 given:  yes Patient signature on Condition Code 44 notice:  yes  Documentation of 2 MD's agreement:  yes  Code 44 added to claim:  yes     Kalik Hoare G., RN 09/12/2023, 8:49 AM

## 2023-09-12 NOTE — Plan of Care (Signed)
  Problem: Ischemic Stroke/TIA Tissue Perfusion: Goal: Complications of ischemic stroke/TIA will be minimized Outcome: Progressing   Problem: Coping: Goal: Will verbalize positive feelings about self Outcome: Progressing Goal: Will identify appropriate support needs Outcome: Progressing   Problem: Self-Care: Goal: Ability to participate in self-care as condition permits will improve Outcome: Progressing Goal: Verbalization of feelings and concerns over difficulty with self-care will improve Outcome: Progressing Goal: Ability to communicate needs accurately will improve Outcome: Progressing   Problem: Nutrition: Goal: Risk of aspiration will decrease Outcome: Progressing Goal: Dietary intake will improve Outcome: Progressing   Problem: Clinical Measurements: Goal: Ability to maintain clinical measurements within normal limits will improve Outcome: Progressing Goal: Will remain free from infection Outcome: Progressing Goal: Diagnostic test results will improve Outcome: Progressing Goal: Respiratory complications will improve Outcome: Progressing Goal: Cardiovascular complication will be avoided Outcome: Progressing   Problem: Activity: Goal: Risk for activity intolerance will decrease Outcome: Progressing   Problem: Pain Managment: Goal: General experience of comfort will improve and/or be controlled Outcome: Progressing   Problem: Safety: Goal: Ability to remain free from injury will improve Outcome: Progressing   Problem: Skin Integrity: Goal: Risk for impaired skin integrity will decrease Outcome: Progressing

## 2023-09-12 NOTE — Care Management Obs Status (Signed)
 MEDICARE OBSERVATION STATUS NOTIFICATION   Patient Details  Name: Annette Gross MRN: [id number] Date of Birth: December 31, 1940   Medicare Observation Status Notification Given:  Yes    Betsabe Iglesia G., RN 09/12/2023, 8:48 AM

## 2023-09-12 NOTE — Progress Notes (Signed)
 PROGRESS NOTE    MARTISA JASPER  VWU:[id number] DOB: 02/07/1941 DOA: 09/10/2023 PCP: Karalee Oscar, PA   83/F with history of type 2 diabetes mellitus, hypertension, GERD, factor V Leiden mutation, hypothyroidism, depression, macular degeneration presented to the ED with acute onset painless vision loss in part of her left eye, she was in her usual state of health when she went to bed last night.  Presented to the local ophthalmologist who directed her to the ER.  ED Course: Vital signs stable, labs largely unremarkable, thrombocytosis, MRI noted extensive bilateral embolic appearing infarcts   Subjective: - Overnight had transient improvement in left eye visual field and now back to prior  Assessment and Plan:  Acute embolic stroke - Neurology consulting, MRI noted bilateral extensive infarcts - Now started on aspirin and Plavix daily -MRA without large vessel occlusion -2D echo with preserved EF, -Neurology recommending TEE and loop recorder, discussed with cards today, they will try to work her in as soon as possible this week - LD L is 30, A1c 6.6 - PT OT, SLP eval> no further recommendations     HTN - Holding losartan for permissive hypertension -Blood pressure moderately elevated, restart losartan tomorrow    DM 2 -hold metformin , SSI, A1c is 6.6   History of hypothyroidism Continue Synthroid      DVT prophylaxis: lovenox  Code Status: FUll Code Family Communication: none present  Consultants:    Procedures:   Antimicrobials:    Objective: Vitals:   09/11/23 1958 09/12/23 0031 09/12/23 0500 09/12/23 0741  BP: (!) 146/64 (!) 155/78 (!) 172/87 (!) 155/87  Pulse: 85 97 90 87  Resp:  19    Temp: 98.4 F (36.9 C) 97.9 F (36.6 C) 98 F (36.7 C) 97.9 F (36.6 C)  TempSrc: Oral Oral Axillary Oral  SpO2: 92% 97% 94% 95%  Weight:      Height:        Intake/Output Summary (Last 24 hours) at 09/12/2023 1050 Last data filed at 09/11/2023 2136 Gross per 24 hour   Intake 490 ml  Output --  Net 490 ml   Filed Weights   09/10/23 1154  Weight: 71.7 kg    Examination:  General exam: Appears calm and comfortable  Respiratory system: Clear to auscultation Cardiovascular system: S1 & S2 heard, RRR.  Abd: nondistended, soft and nontender.Normal bowel sounds heard. Central nervous system: Alert and oriented.,  Some visual field deficits in left eye, no other focal findings Extremities: no edema Skin: No rashes Psychiatry:  Mood & affect appropriate.     Data Reviewed:   CBC: Recent Labs  Lab 09/10/23 1223 09/10/23 1226 09/10/23 1929  WBC  --  9.4 11.4*  NEUTROABS  --  4.8  --   HGB 16.0* 15.0 13.7  HCT 47.0* 44.4 41.2  MCV  --  89.2 89.6  PLT  --  481* 433*   Basic Metabolic Panel: Recent Labs  Lab 09/10/23 1223 09/10/23 1226 09/10/23 1929  NA 134* 135  --   K 4.1 4.3  --   CL 98 98  --   CO2  --  22  --   GLUCOSE 144* 139*  --   BUN 13 13  --   CREATININE 0.60 0.67 0.70  CALCIUM   --  9.1  --    GFR: Estimated Creatinine Clearance: 47.9 mL/min (by C-G formula based on SCr of 0.7 mg/dL). Liver Function Tests: Recent Labs  Lab 09/10/23 1226  AST 19  ALT 10  ALKPHOS 84  BILITOT 0.6  PROT 6.8  ALBUMIN 3.6   No results for input(s): "LIPASE", "AMYLASE" in the last 168 hours. No results for input(s): "AMMONIA" in the last 168 hours. Coagulation Profile: Recent Labs  Lab 09/10/23 1226  INR 1.0   Cardiac Enzymes: No results for input(s): "CKTOTAL", "CKMB", "CKMBINDEX", "TROPONINI" in the last 168 hours. BNP (last 3 results) No results for input(s): "PROBNP" in the last 8760 hours. HbA1C: Recent Labs    09/11/23 0811  HGBA1C 6.6*   CBG: Recent Labs  Lab 09/11/23 0622 09/11/23 1130 09/11/23 1613 09/11/23 2122 09/12/23 0631  GLUCAP 119* 171* 157* 203* 128*   Lipid Profile: Recent Labs    09/11/23 0811  CHOL 128  HDL 52  LDLCALC 30  TRIG 230*  CHOLHDL 2.5   Thyroid  Function Tests: No results  for input(s): "TSH", "T4TOTAL", "FREET4", "T3FREE", "THYROIDAB" in the last 72 hours. Anemia Panel: No results for input(s): "VITAMINB12", "FOLATE", "FERRITIN", "TIBC", "IRON", "RETICCTPCT" in the last 72 hours. Urine analysis:    Component Value Date/Time   COLORURINE YELLOW 01/29/2023 2252   APPEARANCEUR CLOUDY (A) 01/29/2023 2252   LABSPEC 1.036 (H) 01/29/2023 2252   PHURINE 5.5 01/29/2023 2252   GLUCOSEU NEGATIVE 01/29/2023 2252   HGBUR NEGATIVE 01/29/2023 2252   BILIRUBINUR NEGATIVE 01/29/2023 2252   KETONESUR 15 (A) 01/29/2023 2252   PROTEINUR 100 (A) 01/29/2023 2252   UROBILINOGEN 0.2 07/23/2007 0932   NITRITE NEGATIVE 01/29/2023 2252   LEUKOCYTESUR MODERATE (A) 01/29/2023 2252   Sepsis Labs: @LABRCNTIP (procalcitonin:4,lacticidven:4)  )No results found for this or any previous visit (from the past 240 hours).   Radiology Studies: ECHOCARDIOGRAM COMPLETE Result Date: 09/11/2023    ECHOCARDIOGRAM REPORT   Patient Name:   AMBRIA ANSPAUGH Date of Exam: 09/11/2023 Medical Rec #:  [id number]     Height:       60.0 in Accession #:    [id number]    Weight:       158.0 lb Date of Birth:  Sep 22, 1940      BSA:          1.689 m Patient Age:    83 years      BP:           124/90 mmHg Patient Gender: F             HR:           88 bpm. Exam Location:  Inpatient Procedure: 2D Echo, Color Doppler and Cardiac Doppler (Both Spectral and Color            Flow Doppler were utilized during procedure). Indications:    Stroke I63.9  History:        Patient has no prior history of Echocardiogram examinations.  Sonographer:    Hersey Lorenzo RDCS Referring Phys: Leandro Proffer [place] IMPRESSIONS  1. Left ventricular ejection fraction, by estimation, is 60 to 65%. The left ventricle has normal function. The left ventricle has no regional wall motion abnormalities. There is mild concentric left ventricular hypertrophy. Left ventricular diastolic parameters are indeterminate.  2. Right ventricular systolic function  is mildly reduced. The right ventricular size is mildly enlarged.  3. A small pericardial effusion is present. The pericardial effusion is circumferential.  4. The mitral valve is normal in structure. No evidence of mitral valve regurgitation. No evidence of mitral stenosis.  5. The aortic valve is calcified. There is mild calcification of the aortic valve. There is moderate thickening of the  aortic valve. Aortic valve regurgitation is not visualized. No aortic stenosis is present.  6. The inferior vena cava is normal in size with greater than 50% respiratory variability, suggesting right atrial pressure of 3 mmHg. FINDINGS  Left Ventricle: Left ventricular ejection fraction, by estimation, is 60 to 65%. The left ventricle has normal function. The left ventricle has no regional wall motion abnormalities. The left ventricular internal cavity size was normal in size. There is  mild concentric left ventricular hypertrophy. Left ventricular diastolic parameters are indeterminate. Right Ventricle: The right ventricular size is mildly enlarged. No increase in right ventricular wall thickness. Right ventricular systolic function is mildly reduced. Left Atrium: Left atrial size was normal in size. Right Atrium: Right atrial size was normal in size. Pericardium: A small pericardial effusion is present. The pericardial effusion is circumferential. Mitral Valve: The mitral valve is normal in structure. There is mild thickening of the mitral valve leaflet(s). No evidence of mitral valve regurgitation. No evidence of mitral valve stenosis. Tricuspid Valve: The tricuspid valve is normal in structure. Tricuspid valve regurgitation is not demonstrated. No evidence of tricuspid stenosis. Aortic Valve: The aortic valve is calcified. There is mild calcification of the aortic valve. There is moderate thickening of the aortic valve. There is mild aortic valve annular calcification. Aortic valve regurgitation is not visualized. No aortic  stenosis is present. Pulmonic Valve: The pulmonic valve was normal in structure. Pulmonic valve regurgitation is not visualized. No evidence of pulmonic stenosis. Aorta: The aortic root is normal in size and structure. Venous: The inferior vena cava is normal in size with greater than 50% respiratory variability, suggesting right atrial pressure of 3 mmHg. IAS/Shunts: No atrial level shunt detected by color flow Doppler.  LEFT VENTRICLE PLAX 2D LVIDd:         3.60 cm   Diastology LVIDs:         2.20 cm   LV e' medial:  6.20 cm/s LV PW:         1.00 cm   LV e' lateral: 6.74 cm/s LV IVS:        1.10 cm LVOT diam:     1.90 cm LV SV:         38 LV SV Index:   23 LVOT Area:     2.84 cm  RIGHT VENTRICLE            IVC RV S prime:     7.83 cm/s  IVC diam: 1.30 cm TAPSE (M-mode): 1.2 cm LEFT ATRIUM           Index LA diam:      4.50 cm 2.66 cm/m LA Vol (A2C): 18.6 ml 11.01 ml/m LA Vol (A4C): 34.1 ml 20.19 ml/m  AORTIC VALVE LVOT Vmax:   67.10 cm/s LVOT Vmean:  48.700 cm/s LVOT VTI:    0.135 m  AORTA Ao Root diam: 2.80 cm Ao Asc diam:  3.30 cm  SHUNTS Systemic VTI:  0.14 m Systemic Diam: 1.90 cm Kardie Tobb DO Electronically signed by Jerryl Morin DO Signature Date/Time: 09/11/2023/3:13:49 PM    Final    MR ANGIO HEAD WO CONTRAST Result Date: 09/10/2023 CLINICAL DATA:  Follow-up examination for acute stroke. EXAM: MRA HEAD WITHOUT CONTRAST TECHNIQUE: Angiographic images of the Circle of Willis were acquired using MRA technique without intravenous contrast. COMPARISON:  Prior brain MRI from earlier the same day. FINDINGS: Anterior circulation: Both internal carotid arteries are patent through the siphons without stenosis or other abnormality. A1 segments patent  bilaterally. Right A1 is hypoplastic, accounting for the diminutive right ICA is compared to the left. Normal anterior communicating artery complex. Both ACAs patent to their distal aspects without significant stenosis. Tiny outpouching arising from the proximal A2  segment noted, which appears to be consistent with a small vascular infundibulum on 3D source time-of-flight sequence. No M1 stenosis or occlusion. Distal MCA branches perfused and symmetric. Posterior circulation: Both V4 segments patent without stenosis. Both PICA patent. Basilar patent without stenosis. Superior cerebral arteries patent bilaterally. Left PCA supplied via the basilar. Fetal type origin of the right PCA. Both PCAs patent without stenosis. Anatomic variants: As above. Other: None. IMPRESSION: Negative intracranial MRA. No large vessel occlusion or hemodynamically significant stenosis. Electronically Signed   By: Virgia Griffins M.D.   On: 09/10/2023 20:23   MR ORBITS W WO CONTRAST Result Date: 09/10/2023 CLINICAL DATA:  Initial evaluation for acute left-sided visual loss. EXAM: MRI OF THE ORBITS WITHOUT AND WITH CONTRAST TECHNIQUE: Multiplanar, multi-echo pulse sequences of the orbits and surrounding structures were acquired including fat saturation techniques, before and after intravenous contrast administration. CONTRAST:  7mL GADAVIST GADOBUTROL 1 MMOL/ML IV SOLN COMPARISON:  Prior brain MRI from earlier the same day. FINDINGS: Orbits: Globes are symmetric in size with normal appearance and morphology. Prior bilateral ocular lens replacement. Optic nerves fairly symmetric and within normal limits. No abnormal edema or enhancement about either optic nerve or optic nerve sheath complex. Intraconal and extraconal fat maintained. Extra-ocular muscles symmetric and within normal limits. Lacrimal glands within normal limits. No abnormality about the cavernous sinus or suprasellar cistern. Visualized sinuses: Mild mucosal thickening present about the sphenoethmoidal sinuses. Paranasal sinuses are otherwise clear. Left-to-right nasal septal deviation with associated concha bullosa. Soft tissues: Periorbital soft tissues within normal limits. Limited intracranial: Atrophy with chronic small vessel  ischemic disease, described on prior brain MRI. 8 mm meningioma overlying the anterior right frontal convexity without mass effect. IMPRESSION: 1. Normal MRI of the orbits. 2. 8 mm meningioma overlying the anterior right frontal convexity without mass effect. 3. Underlying atrophy with chronic small vessel ischemic disease. Electronically Signed   By: Virgia Griffins M.D.   On: 09/10/2023 20:20   MR BRAIN WO CONTRAST Result Date: 09/10/2023 CLINICAL DATA:  Provided history: Vision loss, monocular. Left eye vision loss. Concern for CRAO. EXAM: MRI HEAD WITHOUT CONTRAST TECHNIQUE: Multiplanar, multiecho pulse sequences of the brain and surrounding structures were obtained without intravenous contrast. COMPARISON:  Head CT 09/10/2023. FINDINGS: Brain: Generalized cerebral atrophy. There are multiple small acute infarcts scattered within the bilateral cerebral hemispheres, 10-15 in number. The largest is an 8 mm acute cortically-based infarct within the right parietal lobe (series 2, image 33). 8 mm dural-based mass overlying the anteromedial right frontal lobe most consistent with a meningioma (series 7, image 20). The mass slightly indents the underlying brain parenchyma. No underlying parenchymal edema. Multifocal T2 FLAIR hyperintense signal abnormality within the cerebral white matter and pons, nonspecific but compatible with moderate chronic small vessel ischemic disease. Punctate chronic microhemorrhage within the right lentiform nucleus. Prominent perivascular spaces with within the bilateral cerebral hemispheric white matter and within/about the bilateral basal ganglia. Cavum septum pellucidum and cavum vergae (anatomic variant). No extra-axial fluid collection. No midline shift. Vascular: Maintained flow voids within the proximal large arterial vessels. Skull and upper cervical spine: No focal worrisome marrow lesion. Incompletely assessed cervical spondylosis. Sinuses/Orbits: No mass or acute finding  within the imaged orbits. No significant paranasal sinus disease. IMPRESSION: 1. Multiple small  acute infarcts scattered within the bilateral cerebral hemispheres, 10-15 in number and measuring up to 8 mm. Given involvement of multiple vascular territories, findings suggest sequelae of an embolic process. 2. 8 mm meningioma overlying the anterior right frontal lobe. The mass slightly indents the underlying brain parenchyma without underlying parenchymal edema. 3. Background parenchymal atrophy and chronic small vessel ischemic disease. Electronically Signed   By: Bascom Lily D.O.   On: 09/10/2023 15:28   CT HEAD WO CONTRAST Result Date: 09/10/2023 CLINICAL DATA:  Neuro deficit, acute, stroke suspected. EXAM: CT HEAD WITHOUT CONTRAST TECHNIQUE: Contiguous axial images were obtained from the base of the skull through the vertex without intravenous contrast. RADIATION DOSE REDUCTION: This exam was performed according to the departmental dose-optimization program which includes automated exposure control, adjustment of the mA and/or kV according to patient size and/or use of iterative reconstruction technique. COMPARISON:  None Available. FINDINGS: Brain: Age related volume loss. Chronic small-vessel ischemic changes of the white matter and basal ganglia. Probable small vessel change of the pons. No evidence of acute infarction, mass lesion, hemorrhage, hydrocephalus or extra-axial collection. Vascular: There is atherosclerotic calcification of the major vessels at the base of the brain. Skull: Negative Sinuses/Orbits: Clear/normal Other: None IMPRESSION: No acute CT finding. Age related volume loss. Chronic small-vessel ischemic changes of the white matter and basal ganglia. Electronically Signed   By: Bettylou Brunner M.D.   On: 09/10/2023 14:20     Scheduled Meds:   stroke: early stages of recovery book   Does not apply Once   aspirin EC  81 mg Oral Daily   clopidogrel  75 mg Oral Daily   DULoxetine   60 mg  Oral QHS   enoxaparin  (LOVENOX ) injection  40 mg Subcutaneous QHS   insulin  aspart  0-9 Units Subcutaneous TID WC   levothyroxine   88 mcg Oral Q0600   pantoprazole   40 mg Oral QHS   sodium chloride  flush  3 mL Intravenous Once   sodium chloride  flush  3-10 mL Intravenous Q12H   Continuous Infusions:   LOS: 1 day    Time spent:    Deforest Fast, MD [facility]   09/12/2023, 10:50 AM

## 2023-09-12 NOTE — Progress Notes (Signed)
 STROKE TEAM PROGRESS NOTE    INTERIM HISTORY/SUBJECTIVE  Patient reports that vision in her left eye is about the same.  TEE plan for Monday.   She is worried about her BP, wants to start her losartan.   OBJECTIVE  CBC    Component Value Date/Time   WBC 11.4 (H) 09/10/2023 1929   RBC 4.60 09/10/2023 1929   HGB 13.7 09/10/2023 1929   HCT 41.2 09/10/2023 1929   PLT 433 (H) 09/10/2023 1929   MCV 89.6 09/10/2023 1929   MCH 29.8 09/10/2023 1929   MCHC 33.3 09/10/2023 1929   RDW 13.6 09/10/2023 1929   LYMPHSABS 3.2 09/10/2023 1226   MONOABS 1.1 (H) 09/10/2023 1226   EOSABS 0.2 09/10/2023 1226   BASOSABS 0.1 09/10/2023 1226    BMET    Component Value Date/Time   NA 135 09/10/2023 1226   K 4.3 09/10/2023 1226   CL 98 09/10/2023 1226   CO2 22 09/10/2023 1226   GLUCOSE 139 (H) 09/10/2023 1226   BUN 13 09/10/2023 1226   CREATININE 0.70 09/10/2023 1929   CALCIUM  9.1 09/10/2023 1226   GFRNONAA >60 09/10/2023 1929    IMAGING past 24 hours ECHOCARDIOGRAM COMPLETE Result Date: 09/11/2023    ECHOCARDIOGRAM REPORT   Patient Name:   Annette Gross Date of Exam: 09/11/2023 Medical Rec #:  [id number]     Height:       60.0 in Accession #:    [id number]    Weight:       158.0 lb Date of Birth:  04-Oct-1940      BSA:          1.689 m Patient Age:    83 years      BP:           124/90 mmHg Patient Gender: F             HR:           88 bpm. Exam Location:  Inpatient Procedure: 2D Echo, Color Doppler and Cardiac Doppler (Both Spectral and Color            Flow Doppler were utilized during procedure). Indications:    Stroke I63.9  History:        Patient has no prior history of Echocardiogram examinations.  Sonographer:    Hersey Lorenzo RDCS Referring Phys: Leandro Proffer [place] IMPRESSIONS  1. Left ventricular ejection fraction, by estimation, is 60 to 65%. The left ventricle has normal function. The left ventricle has no regional wall motion abnormalities. There is mild concentric left ventricular  hypertrophy. Left ventricular diastolic parameters are indeterminate.  2. Right ventricular systolic function is mildly reduced. The right ventricular size is mildly enlarged.  3. A small pericardial effusion is present. The pericardial effusion is circumferential.  4. The mitral valve is normal in structure. No evidence of mitral valve regurgitation. No evidence of mitral stenosis.  5. The aortic valve is calcified. There is mild calcification of the aortic valve. There is moderate thickening of the aortic valve. Aortic valve regurgitation is not visualized. No aortic stenosis is present.  6. The inferior vena cava is normal in size with greater than 50% respiratory variability, suggesting right atrial pressure of 3 mmHg. FINDINGS  Left Ventricle: Left ventricular ejection fraction, by estimation, is 60 to 65%. The left ventricle has normal function. The left ventricle has no regional wall motion abnormalities. The left ventricular internal cavity size was normal in size. There is  mild  concentric left ventricular hypertrophy. Left ventricular diastolic parameters are indeterminate. Right Ventricle: The right ventricular size is mildly enlarged. No increase in right ventricular wall thickness. Right ventricular systolic function is mildly reduced. Left Atrium: Left atrial size was normal in size. Right Atrium: Right atrial size was normal in size. Pericardium: A small pericardial effusion is present. The pericardial effusion is circumferential. Mitral Valve: The mitral valve is normal in structure. There is mild thickening of the mitral valve leaflet(s). No evidence of mitral valve regurgitation. No evidence of mitral valve stenosis. Tricuspid Valve: The tricuspid valve is normal in structure. Tricuspid valve regurgitation is not demonstrated. No evidence of tricuspid stenosis. Aortic Valve: The aortic valve is calcified. There is mild calcification of the aortic valve. There is moderate thickening of the aortic  valve. There is mild aortic valve annular calcification. Aortic valve regurgitation is not visualized. No aortic stenosis is present. Pulmonic Valve: The pulmonic valve was normal in structure. Pulmonic valve regurgitation is not visualized. No evidence of pulmonic stenosis. Aorta: The aortic root is normal in size and structure. Venous: The inferior vena cava is normal in size with greater than 50% respiratory variability, suggesting right atrial pressure of 3 mmHg. IAS/Shunts: No atrial level shunt detected by color flow Doppler.  LEFT VENTRICLE PLAX 2D LVIDd:         3.60 cm   Diastology LVIDs:         2.20 cm   LV e' medial:  6.20 cm/s LV PW:         1.00 cm   LV e' lateral: 6.74 cm/s LV IVS:        1.10 cm LVOT diam:     1.90 cm LV SV:         38 LV SV Index:   23 LVOT Area:     2.84 cm  RIGHT VENTRICLE            IVC RV S prime:     7.83 cm/s  IVC diam: 1.30 cm TAPSE (M-mode): 1.2 cm LEFT ATRIUM           Index LA diam:      4.50 cm 2.66 cm/m LA Vol (A2C): 18.6 ml 11.01 ml/m LA Vol (A4C): 34.1 ml 20.19 ml/m  AORTIC VALVE LVOT Vmax:   67.10 cm/s LVOT Vmean:  48.700 cm/s LVOT VTI:    0.135 m  AORTA Ao Root diam: 2.80 cm Ao Asc diam:  3.30 cm  SHUNTS Systemic VTI:  0.14 m Systemic Diam: 1.90 cm Kardie Tobb DO Electronically signed by Jerryl Morin DO Signature Date/Time: 09/11/2023/3:13:49 PM    Final     Vitals:   09/11/23 1958 09/12/23 0031 09/12/23 0500 09/12/23 0741  BP: (!) 146/64 (!) 155/78 (!) 172/87 (!) 155/87  Pulse: 85 97 90 87  Resp:  19    Temp: 98.4 F (36.9 C) 97.9 F (36.6 C) 98 F (36.7 C) 97.9 F (36.6 C)  TempSrc: Oral Oral Axillary Oral  SpO2: 92% 97% 94% 95%  Weight:      Height:         PHYSICAL EXAM General:  Alert, well-nourished, well-developed patient in no acute distress Psych:  Mood and affect appropriate for situation CV: Regular rate and rhythm on monitor Respiratory:  Regular, unlabored respirations on room air   NEURO:  Mental Status: AA&Ox3, patient is  able to give clear and coherent history Speech/Language: speech is without dysarthria or aphasia.    Cranial Nerves:  II:  PERRL. significant lower visual field deficit in left eye remains III, IV, VI: EOMI. Eyelids elevate symmetrically.  V: Sensation is intact to light touch and symmetrical to face.  VII: Face is symmetrical resting and smiling VIII: hearing intact to voice. IX, X: Palate elevates symmetrically. Phonation is normal.  ZH:[id number] shrug 5/5. XII: tongue is midline without fasciculations. Motor: 5/5 strength to all muscle groups tested.  Tone: is normal and bulk is normal Sensation- Intact to light touch bilaterally.  Coordination: FTN intact bilaterally Gait- deferred  Most Recent NIH 1 for vision.   ASSESSMENT/PLAN  Annette Gross is a 83 y.o. female with history of asthma, diabetes, hypertension, GERD, factor V deficiency (elsewhere charted says patient has factor V Leiden but this is an error), thyroidectomy, depression, fibromyalgia, IBS, hyperlipidemia and peripheral neuropathy admitted for painless loss of vision in the central field of the left eye.  She was seen by her ophthalmologist who diagnosed her with a CRAO and sent her to the emergency department for further evaluation.  On MRI, she was found to have multiple embolic appearing ischemic strokes.  NIH on Admission 1  Acute Ischemic Infarct: Left eye CRAO and multiple small scattered ischemic infarcts Etiology: Likely embolic CT head No acute abnormality. Small vessel disease. Atrophy.  MRI multiple small acute infarcts scattered within bilateral cerebral hemispheres, likely embolic in nature, 8 mm meningioma overlaying anterior right frontal lobe, background atrophy and chronic small vessel ischemic disease MRA no LVO or hemodynamically significant stenosis Carotid Doppler pending Bilateral lower extremity ultrasound pending 2D Echo EF 60 to 65%, mild concentric LVH, mild enlargement of right  ventricle, small pericardial effusion, normal left atrial size, no atrial level shunt Consider loop recorder 30-day cardiac monitor at discharge TEE On Monday LDL 30 HgbA1c 6.6 VTE prophylaxis -Lovenox  No antithrombotic prior to admission, now on aspirin 81 mg daily and clopidogrel 75 mg daily for 3 weeks and then aspirin alone. Therapy recommendations:  Home Health OT Disposition: pending  Hypertension Home meds: Losartan 25 mg daily Stable Blood Pressure Goal: BP less than 220/110. Start losartan today.  Hyperlipidemia Home meds: Repatha 140 mg every 2 weeks LDL 30, goal < 70 High intensity statin not indicated as LDL is below goal Continue statin at discharge  Other Stroke Risk Factors Obesity, Body mass index is 30.86 kg/m., BMI >/= 30 associated with increased stroke risk, recommend weight loss, diet and exercise as appropriate    Other Active Problems None  Hospital day # 1   Embolic workup on going.     To contact Stroke Continuity provider, please refer to [email or web address]. After hours, contact General Neurology

## 2023-09-12 NOTE — Evaluation (Signed)
 Speech Language Pathology Evaluation Patient Details Name: Annette Gross MRN: [id number] DOB: May 16, 1940 Today's Date: 09/12/2023 Time: 5366-4403 SLP Time Calculation (min) (ACUTE ONLY): 11 min  Problem List:  Patient Active Problem List   Diagnosis Date Noted   CVA (cerebral vascular accident) (HCC) 09/10/2023   Stroke (cerebrum) (HCC) 09/10/2023   Pseudophakia, both eyes 10/13/2021   Dermatochalasis of both lower eyelids 10/13/2021   Moderate nonproliferative diabetic retinopathy of left eye (HCC) 10/10/2020   Moderate nonproliferative diabetic retinopathy of right eye (HCC) 08/29/2019   Early stage nonexudative age-related macular degeneration of both eyes 08/29/2019   Right posterior capsular opacification 08/29/2019   Left thyroid  nodule 07/06/2016   Benign neoplasm of skin of upper limb, including shoulder 04/19/2012   Ventral hernia, recurrent 01/26/2011   Past Medical History:  Past Medical History:  Diagnosis Date   Anemia    many yrs ago   Anxiety    Asthma    no recent issues   Cancer (HCC)    s/p thyroidectomy 2018   Complication of anesthesia    Cystitis, interstitial    Degenerative disc disease, lumbar    Degenerative joint disease    spinal stenosis "Chronic low back pain"   Depression    Diabetes mellitus    diet control.   Diverticulitis 2005   Diverticulosis 2005   Factor V deficiency (HCC)    Fibromyalgia    Fracture of right foot 1995   GERD (gastroesophageal reflux disease)    History of hiatal hernia    Hyperlipidemia    Hypertension    off med x 5 yrs.   IBS (irritable bowel syndrome)    Insomnia    Iron deficiency    Liver hemangioma 1999   MRI   Mononucleosis    Morton's neuroma    Left foot   Nuclear sclerotic cataract of left eye 08/29/2019   Peripheral neuropathy    Pneumonia    years 89 & 89.  None since   PONV (postoperative nausea and vomiting)    nausea no vomiting   Rectocele    Transfusion history    child x2, many yrs  ago after childbirth"hemorrhage"   Umbilical hernia 2012   Past Surgical History:  Past Surgical History:  Procedure Laterality Date   blood vessel tumor removal  1980   from chin   CHOLECYSTECTOMY     COLONOSCOPY WITH PROPOFOL  N/A 09/02/2015   Procedure: COLONOSCOPY WITH PROPOFOL ;  Surgeon: Garrett Kallman, MD;  Location: [facility];  Service: Endoscopy;  Laterality: N/A;   GANGLION CYST EXCISION     right   HEMORRHOID SURGERY     HERNIA REPAIR     repair was aimed at the [facility]   HIATAL HERNIA REPAIR     and nissen fundoplication   LAPAROSCOPIC ESOPHAGOGASTRIC FUNDOPLASTY     LAPAROSCOPIC INCISIONAL / UMBILICAL / VENTRAL HERNIA REPAIR     umbilical hernia   OOPHORECTOMY     left   RIGHT OOPHORECTOMY     '05-laparaoscopic   SHOULDER SURGERY Right    THYROIDECTOMY N/A 07/06/2016   Procedure: TOTAL THYROIDECTOMY;  Surgeon: Adalberto Hollow, MD;  Location: [facility] OR;  Service: General;  Laterality: N/A;   TONSILLECTOMY     TOTAL THYROIDECTOMY  07/06/2016   HPI:  Pt is an 83 yo female presenting to [facility] 09[facility]10/23 with acute vision loss in part of her L eye. MRI showed scattered small acute infarcts in the bilateral cerebral hemispheres. PMH of DM II,  anxiety, cancer, DJD, DM, fibromyalgia, HTN, HLD, IBS   Assessment / Plan / Recommendation Clinical Impression  Pt reports concerns with processing PTA but attributes this to age. She states she is typically independent but has assistance PRN from her son and his family. She scored 23/30 on the SLUMS (a 27 or above is considered WFL) characterized by deficits related to memory (retrieval > stroage), selective attention, and executive functioning. She accurately recalled 3/5 novel words after a delay but states this is likely the most signficant change from her baseline. Pt had difficulty with a clock drawing task, repeating numbers and incorrectly placing the hands. Question effect of L vision deficits. Recommend ongoing SLP f/u on an OP basis, will  sign off acutelty.    SLP Assessment  SLP Recommendation/Assessment: All further Speech Lanaguage Pathology  needs can be addressed in the next venue of care SLP Visit Diagnosis: Cognitive communication deficit (R41.841)    Recommendations for follow up therapy are one component of a multi-disciplinary discharge planning process, led by the attending physician.  Recommendations may be updated based on patient status, additional functional criteria and insurance authorization.    Follow Up Recommendations  Outpatient SLP    Assistance Recommended at Discharge  Intermittent Supervision/Assistance  Functional Status Assessment Patient has had a recent decline in their functional status and demonstrates the ability to make significant improvements in function in a reasonable and predictable amount of time.  Frequency and Duration           SLP Evaluation Cognition  Overall Cognitive Status: Impaired/Different from baseline Arousal/Alertness: Awake/alert Orientation Level: Oriented X4 Attention: Selective Selective Attention: Impaired Selective Attention Impairment: Verbal basic Memory: Impaired Memory Impairment: Retrieval deficit Awareness: Appears intact Problem Solving: Appears intact       Comprehension  Auditory Comprehension Overall Auditory Comprehension: Appears within functional limits for tasks assessed    Expression Expression Primary Mode of Expression: Verbal Verbal Expression Overall Verbal Expression: Appears within functional limits for tasks assessed   Oral / Motor  Oral Motor/Sensory Function Overall Oral Motor/Sensory Function: Within functional limits Motor Speech Overall Motor Speech: Appears within functional limits for tasks assessed            Amil Kale, M.A., CCC-SLP Speech Language Pathology, Acute Rehabilitation Services  Secure Chat preferred [phone]  09/12/2023, 12:06 PM

## 2023-09-13 ENCOUNTER — Observation Stay (HOSPITAL_BASED_OUTPATIENT_CLINIC_OR_DEPARTMENT_OTHER)
Admit: 2023-09-13 | Discharge: 2023-09-13 | Disposition: A | Discharge status: Home / Self Care | Attending: Internal Medicine | Admitting: Internal Medicine

## 2023-09-13 ENCOUNTER — Observation Stay (HOSPITAL_COMMUNITY): Admitting: Anesthesiology

## 2023-09-13 ENCOUNTER — Encounter (HOSPITAL_COMMUNITY)
Admission: EM | Disposition: A | Discharge status: Home / Self Care | Payer: Self-pay | Source: Home / Self Care | Attending: Emergency Medicine

## 2023-09-13 ENCOUNTER — Encounter (HOSPITAL_COMMUNITY)

## 2023-09-13 ENCOUNTER — Observation Stay (HOSPITAL_BASED_OUTPATIENT_CLINIC_OR_DEPARTMENT_OTHER)

## 2023-09-13 ENCOUNTER — Observation Stay (HOSPITAL_COMMUNITY)

## 2023-09-13 ENCOUNTER — Encounter (HOSPITAL_COMMUNITY): Payer: Self-pay | Admitting: Internal Medicine

## 2023-09-13 ENCOUNTER — Other Ambulatory Visit: Payer: Self-pay | Admitting: Neurology

## 2023-09-13 DIAGNOSIS — I6340 Cerebral infarction due to embolism of unspecified cerebral artery: Secondary | ICD-10-CM | POA: Diagnosis not present

## 2023-09-13 DIAGNOSIS — D72829 Elevated white blood cell count, unspecified: Secondary | ICD-10-CM

## 2023-09-13 DIAGNOSIS — E1151 Type 2 diabetes mellitus with diabetic peripheral angiopathy without gangrene: Secondary | ICD-10-CM | POA: Diagnosis not present

## 2023-09-13 DIAGNOSIS — I639 Cerebral infarction, unspecified: Secondary | ICD-10-CM

## 2023-09-13 DIAGNOSIS — H3412 Central retinal artery occlusion, left eye: Secondary | ICD-10-CM | POA: Diagnosis not present

## 2023-09-13 DIAGNOSIS — I10 Essential (primary) hypertension: Secondary | ICD-10-CM

## 2023-09-13 DIAGNOSIS — J45909 Unspecified asthma, uncomplicated: Secondary | ICD-10-CM

## 2023-09-13 DIAGNOSIS — I340 Nonrheumatic mitral (valve) insufficiency: Secondary | ICD-10-CM | POA: Diagnosis not present

## 2023-09-13 DIAGNOSIS — E785 Hyperlipidemia, unspecified: Secondary | ICD-10-CM | POA: Diagnosis not present

## 2023-09-13 DIAGNOSIS — R29701 NIHSS score 1: Secondary | ICD-10-CM | POA: Diagnosis not present

## 2023-09-13 DIAGNOSIS — F418 Other specified anxiety disorders: Secondary | ICD-10-CM | POA: Diagnosis not present

## 2023-09-13 DIAGNOSIS — D329 Benign neoplasm of meninges, unspecified: Secondary | ICD-10-CM | POA: Diagnosis not present

## 2023-09-13 DIAGNOSIS — E119 Type 2 diabetes mellitus without complications: Secondary | ICD-10-CM | POA: Diagnosis not present

## 2023-09-13 HISTORY — PX: LOOP RECORDER INSERTION: EP1214

## 2023-09-13 HISTORY — PX: TRANSESOPHAGEAL ECHOCARDIOGRAM (CATH LAB): EP1270

## 2023-09-13 LAB — BASIC METABOLIC PANEL WITH GFR
Anion gap: 11 (ref 5–15)
BUN: 16 mg/dL (ref 8–23)
CO2: 25 mmol/L (ref 22–32)
Calcium: 8.3 mg/dL — ABNORMAL LOW (ref 8.9–10.3)
Chloride: 99 mmol/L (ref 98–111)
Creatinine, Ser: 0.71 mg/dL (ref 0.44–1.00)
GFR, Estimated: >60 mL/min (ref >60)
Glucose, Bld: 131 mg/dL — ABNORMAL HIGH (ref 70–99)
Potassium: 3.7 mmol/L (ref 3.5–5.1)
Sodium: 135 mmol/L (ref 135–145)

## 2023-09-13 LAB — GLUCOSE, CAPILLARY
Glucose-Capillary: 125 mg/dL — ABNORMAL HIGH (ref 70–99)
Glucose-Capillary: 138 mg/dL — ABNORMAL HIGH (ref 70–99)

## 2023-09-13 LAB — ECHO TEE

## 2023-09-13 SURGERY — TRANSESOPHAGEAL ECHOCARDIOGRAM (TEE) (CATHLAB)
Anesthesia: Monitor Anesthesia Care

## 2023-09-13 SURGERY — LOOP RECORDER INSERTION

## 2023-09-13 MED ORDER — PROPOFOL 10 MG/ML IV BOLUS
INTRAVENOUS | Status: DC | PRN
  Administered 2023-09-13: 40 mg via INTRAVENOUS

## 2023-09-13 MED ORDER — ASPIRIN 81 MG PO TBEC
81.0000 mg | DELAYED_RELEASE_TABLET | Freq: Every day | ORAL | 1 refills | Status: DC
Start: 2023-09-13 — End: 2023-11-29

## 2023-09-13 MED ORDER — SODIUM CHLORIDE 0.9% FLUSH: 3.0000 mL | Freq: Two times a day (BID) | INTRAVENOUS | Status: DC

## 2023-09-13 MED ORDER — LIDOCAINE 2% (20 MG/ML) 5 ML SYRINGE
INTRAMUSCULAR | Status: DC | PRN
  Administered 2023-09-13: 20 mg via INTRAVENOUS

## 2023-09-13 MED ORDER — CLOPIDOGREL BISULFATE 75 MG PO TABS
75.0000 mg | ORAL_TABLET | Freq: Every day | ORAL | 0 refills | Status: AC
Start: 2023-09-13 — End: 2023-10-14

## 2023-09-13 MED ORDER — PROPOFOL 500 MG/50ML IV EMUL
INTRAVENOUS | Status: DC | PRN
  Administered 2023-09-13: 75 ug/kg/min via INTRAVENOUS

## 2023-09-13 MED ORDER — LIDOCAINE-EPINEPHRINE 1 %-1:100000 IJ SOLN
INTRAMUSCULAR | Status: DC | PRN
  Administered 2023-09-13: 5 mL
  Administered 2023-09-13: 10 mL

## 2023-09-13 MED ORDER — PHENYLEPHRINE HCL-NACL 20-0.9 MG/250ML-% IV SOLN
INTRAVENOUS | Status: DC | PRN
  Administered 2023-09-13: 25 ug/min via INTRAVENOUS

## 2023-09-13 MED ORDER — SODIUM CHLORIDE 0.9% FLUSH: 3.0000 mL | INTRAVENOUS | Status: DC | PRN

## 2023-09-13 MED ORDER — ONDANSETRON HCL 4 MG/2ML IJ SOLN
4.0000 mg | Freq: Four times a day (QID) | INTRAMUSCULAR | Status: DC | PRN
  Administered 2023-09-13: 4 mg via INTRAVENOUS
  Filled 2023-09-13: qty 2

## 2023-09-13 MED ORDER — LIDOCAINE-EPINEPHRINE 1 %-1:100000 IJ SOLN
INTRAMUSCULAR | Status: AC
  Filled 2023-09-13: qty 1

## 2023-09-13 MED ORDER — SODIUM CHLORIDE 0.9 % IV SOLN: INTRAVENOUS | Status: DC | PRN

## 2023-09-13 SURGICAL SUPPLY — 2 items
MONITOR REVEAL LINQ II (Prosthesis & Implant Heart) IMPLANT
PACK LOOP INSERTION (CUSTOM PROCEDURE TRAY) ×2 IMPLANT

## 2023-09-13 NOTE — Progress Notes (Signed)
 Transported to cath lab for Loop recorder in bed accompanied by transporter.

## 2023-09-13 NOTE — Interval H&P Note (Signed)
 History and Physical Interval Note:  09/13/2023 8:29 AM  Annette Gross  has presented today for surgery, with the diagnosis of CVA.  The various methods of treatment have been discussed with the patient and family. After consideration of risks, benefits and other options for treatment, the patient has consented to  Procedure(s): TRANSESOPHAGEAL ECHOCARDIOGRAM (N/A) as a surgical intervention.  The patient's history has been reviewed, patient examined, no change in status, stable for surgery.  I have reviewed the patient's chart and labs.  Questions were answered to the patient's satisfaction.     Hazle Lites

## 2023-09-13 NOTE — Discharge Instructions (Signed)
 Care After Your Loop Recorder   Monitor your cardiac device site for redness, swelling, and drainage. Call the device clinic at [phone] if you experience these symptoms or fever/chills.  If you notice bleeding from your site, hold firm, but gently pressure with two fingers for 5 minutes. Dried blood on the steri-strips when removing the outer bandage is normal.   Keep the large square bandage on your site for 24 hours and then you may remove it yourself. Keep the steri-strips underneath in place.   You may shower after 72 hours / 3 days from your procedure with the steri-strips in place. They will usually fall off on their own, or may be removed after 10 days. Pat dry.   Avoid lotions, ointments, or perfumes over your incision until it is well-healed.  Please do not submerge in water until your site is completely healed.   Your device is MRI compatible.   Remote monitoring is used to monitor your cardiac device from home. This monitoring is scheduled every month by our office. It allows [place] to keep an eye on the function of your device to ensure it is working properly.

## 2023-09-13 NOTE — Transfer of Care (Signed)
 Immediate Anesthesia Transfer of Care Note  Patient: Annette Gross  Procedure(s) Performed: TRANSESOPHAGEAL ECHOCARDIOGRAM  Patient Location: PACU  Anesthesia Type:MAC  Level of Consciousness: sedated  Airway & Oxygen Therapy: Patient Spontanous Breathing  Post-op Assessment: Report given to RN  Post vital signs: Reviewed and stable  Last Vitals:  Vitals Value Taken Time  BP 115/67   Temp    Pulse 89 09/13/23 1042  Resp 23 09/13/23 1042  SpO2 100 % 09/13/23 1042  Vitals shown include unfiled device data.  Last Pain:  Vitals:   09/13/23 0945  TempSrc: Temporal  PainSc: 0-No pain         Complications: No notable events documented.

## 2023-09-13 NOTE — Progress Notes (Signed)
   [facility] has been requested to perform a transesophageal echocardiogram on Annette Gross for evaluation following CVA.    Absolute Contraindications: n/a Relative Contraindications: History of thyroid  surgery  The patient has: No other conditions that may impact this procedure.    After careful review of history and examination, the risks and benefits of transesophageal echocardiogram have been explained including risks of esophageal damage, perforation (1:10,000 risk), bleeding, pharyngeal hematoma as well as other potential complications associated with conscious sedation including aspiration, arrhythmia, respiratory failure and death. Alternatives to treatment were discussed, questions were answered. Patient is willing to proceed.   Coby Darting, PA-C  09/13/2023 8:16 AM

## 2023-09-13 NOTE — Progress Notes (Signed)
 PT Cancellation Note  Patient Details Name: DARRION CANCHOLA MRN: [id number] DOB: 05-01-1941   Cancelled Treatment:    Reason Eval/Treat Not Completed: Other (comment) (Patient declined, feeling nausea after procedure this morning. Will continue with attempts.)  Ozie Bo, PT, MPT  Erlene Hawks 09/13/2023, 1:46 PM

## 2023-09-13 NOTE — Discharge Summary (Signed)
 Physician Discharge Summary  ASHTON WOODROW ZOX:[id number] DOB: 1940/11/25 DOA: 09/10/2023  PCP: Karalee Oscar, PA  Admit date: 09/10/2023 Discharge date: 09/13/2023  Time spent: 45 minutes  Recommendations for Outpatient Follow-up:  PCP in 1 week [facility] in 1 month   Discharge Diagnoses:  Embolic stroke Hypertension Type 2 diabetes mellitus Hypothyroidism   Moderate nonproliferative diabetic retinopathy of right eye [facility])   Discharge Condition: Improved  Diet recommendation: Diabetic, heart healthy  Filed Weights   09/10/23 1154  Weight: 71.7 kg    History of present illness:   83/F with history of type 2 diabetes mellitus, hypertension, GERD, factor V Leiden mutation, hypothyroidism, depression, macular degeneration presented to the ED with acute onset painless vision loss in part of her left eye, she was in her usual state of health when she went to bed last night.  Presented to the local ophthalmologist who directed her to the ER.  ED Course: Vital signs stable, labs largely unremarkable, thrombocytosis, MRI noted extensive bilateral embolic appearing infarcts  Hospital Course:  Acute embolic stroke - Neurology consulting, MRI noted bilateral extensive infarcts -Now started on aspirin and Plavix daily, recommended to continue aspirin and Plavix daily for 1 month followed by aspirin daily -MRA without large vessel occlusion -2D echo with preserved EF, -Neurology recommended TEE and loop recorder, TEE was unremarkable, loop recorder to be completed today prior to discharge - LD L is 30, A1c 6.6 - PT OT, SLP eval> no further recommendations     HTN - Holding losartan for permissive hypertension -Blood pressure moderately elevated, restart losartan tomorrow    DM 2 -hold metformin , SSI, A1c is 6.6   History of hypothyroidism Continue Synthroid   Discharge Exam: Vitals:   09/13/23 1110 09/13/23 1202  BP: 114/62 103/70  Pulse: 88 84  Resp: 20 19   Temp: 97.6 F (36.4 C) 97.8 F (36.6 C)  SpO2: 92% 96%   Gen: Awake, Alert, Oriented X 3,  HEENT: no JVD Lungs: Good air movement bilaterally, CTAB CVS: S1S2/RRR Abd: soft, Non tender, non distended, BS present Extremities: No edema Neuro: Minor visual field deficits on the left Skin: no new rashes on exposed skin   Discharge Instructions    Allergies as of 09/13/2023       Reactions   Iohexol Anaphylaxis   Ivp Dye [iodinated Contrast Media] Anaphylaxis   Kiwi Extract Anaphylaxis   Neurontin [gabapentin] Other (See Comments)   Auditory hallucinations   Sulfa Antibiotics Hives   Asa [aspirin] Other (See Comments)   Stomach pain   Cipro [ciprofloxacin Hcl] Other (See Comments)   Tendonitis    Lexapro [escitalopram Oxalate] Other (See Comments)   Myalgias    Lipitor [atorvastatin] Other (See Comments)   Myalgias    Lomotil [diphenoxylate-atropine] Other (See Comments)   Severe stomach pain/cramps Dizziness   Lyrica [pregabalin] Other (See Comments)   Auditory hallucinations   Codeine Other (See Comments)   Severe stomach cramps   Diphenoxylate-atropine Other (See Comments)   Severe stomach and dizziness        Medication List     TAKE these medications    acetaminophen  500 MG tablet Commonly known as: TYLENOL  Take 1,000 mg by mouth 2 (two) times daily as needed for moderate pain (pain score 4-6) or headache.   aspirin EC 81 MG tablet Take 1 tablet (81 mg total) by mouth daily. Swallow whole.   clopidogrel 75 MG tablet Commonly known as: PLAVIX Take 1 tablet (75 mg total) by  mouth daily.   DULoxetine  60 MG capsule Commonly known as: CYMBALTA  Take 60 mg by mouth at bedtime.   fluticasone  50 MCG/ACT nasal spray Commonly known as: FLONASE  Place 1 spray into the nose daily.   levocetirizine 5 MG tablet Commonly known as: XYZAL  Take 5 mg by mouth at bedtime.   levothyroxine  88 MCG tablet Commonly known as: SYNTHROID  Take 88 mcg by mouth daily  before breakfast.   losartan 25 MG tablet Commonly known as: COZAAR Take 25 mg by mouth at bedtime.   metFORMIN  500 MG tablet Commonly known as: GLUCOPHAGE  Take 1,000 mg by mouth 2 (two) times daily after a meal.   omeprazole 40 MG capsule Commonly known as: PRILOSEC Take 40 mg by mouth at bedtime.   PreserVision AREDS 2 Caps Take 1 capsule by mouth daily.   Repatha 140 MG/ML Sosy Generic drug: Evolocumab Inject 140 mg into the skin See admin instructions. Inject 1 dose (140mg ) subcutaneously every 14 days on every other Tuesday.   VITAMIN B-12 PO Take 1 tablet by mouth daily.   VITAMIN D-3 PO Take 1 capsule by mouth daily.       Allergies  Allergen Reactions   Iohexol Anaphylaxis   Ivp Dye [Iodinated Contrast Media] Anaphylaxis   Kiwi Extract Anaphylaxis   Neurontin [Gabapentin] Other (See Comments)    Auditory hallucinations   Sulfa Antibiotics Hives   Asa [Aspirin] Other (See Comments)    Stomach pain   Cipro [Ciprofloxacin Hcl] Other (See Comments)    Tendonitis    Lexapro [Escitalopram Oxalate] Other (See Comments)    Myalgias    Lipitor [Atorvastatin] Other (See Comments)    Myalgias    Lomotil [Diphenoxylate-Atropine] Other (See Comments)    Severe stomach pain/cramps Dizziness   Lyrica [Pregabalin] Other (See Comments)    Auditory hallucinations   Codeine Other (See Comments)    Severe stomach cramps   Diphenoxylate-Atropine Other (See Comments)    Severe stomach and dizziness      The results of significant diagnostics from this hospitalization (including imaging, microbiology, ancillary and laboratory) are listed below for reference.    Significant Diagnostic Studies: EP STUDY Result Date: 09/13/2023 See surgical note for result.  ECHOCARDIOGRAM COMPLETE Result Date: 09/11/2023    ECHOCARDIOGRAM REPORT   Patient Name:   NAIMA DANBURY Date of Exam: 09/11/2023 Medical Rec #:  [id number]     Height:       60.0 in Accession #:    [id number]     Weight:       158.0 lb Date of Birth:  January 05, 1941      BSA:          1.689 m Patient Age:    83 years      BP:           124/90 mmHg Patient Gender: F             HR:           88 bpm. Exam Location:  Inpatient Procedure: 2D Echo, Color Doppler and Cardiac Doppler (Both Spectral and Color            Flow Doppler were utilized during procedure). Indications:    Stroke I63.9  History:        Patient has no prior history of Echocardiogram examinations.  Sonographer:    Hersey Lorenzo RDCS Referring Phys: Leandro Proffer [place] IMPRESSIONS  1. Left ventricular ejection fraction, by estimation, is 60 to 65%. The left  ventricle has normal function. The left ventricle has no regional wall motion abnormalities. There is mild concentric left ventricular hypertrophy. Left ventricular diastolic parameters are indeterminate.  2. Right ventricular systolic function is mildly reduced. The right ventricular size is mildly enlarged.  3. A small pericardial effusion is present. The pericardial effusion is circumferential.  4. The mitral valve is normal in structure. No evidence of mitral valve regurgitation. No evidence of mitral stenosis.  5. The aortic valve is calcified. There is mild calcification of the aortic valve. There is moderate thickening of the aortic valve. Aortic valve regurgitation is not visualized. No aortic stenosis is present.  6. The inferior vena cava is normal in size with greater than 50% respiratory variability, suggesting right atrial pressure of 3 mmHg. FINDINGS  Left Ventricle: Left ventricular ejection fraction, by estimation, is 60 to 65%. The left ventricle has normal function. The left ventricle has no regional wall motion abnormalities. The left ventricular internal cavity size was normal in size. There is  mild concentric left ventricular hypertrophy. Left ventricular diastolic parameters are indeterminate. Right Ventricle: The right ventricular size is mildly enlarged. No increase in right ventricular  wall thickness. Right ventricular systolic function is mildly reduced. Left Atrium: Left atrial size was normal in size. Right Atrium: Right atrial size was normal in size. Pericardium: A small pericardial effusion is present. The pericardial effusion is circumferential. Mitral Valve: The mitral valve is normal in structure. There is mild thickening of the mitral valve leaflet(s). No evidence of mitral valve regurgitation. No evidence of mitral valve stenosis. Tricuspid Valve: The tricuspid valve is normal in structure. Tricuspid valve regurgitation is not demonstrated. No evidence of tricuspid stenosis. Aortic Valve: The aortic valve is calcified. There is mild calcification of the aortic valve. There is moderate thickening of the aortic valve. There is mild aortic valve annular calcification. Aortic valve regurgitation is not visualized. No aortic stenosis is present. Pulmonic Valve: The pulmonic valve was normal in structure. Pulmonic valve regurgitation is not visualized. No evidence of pulmonic stenosis. Aorta: The aortic root is normal in size and structure. Venous: The inferior vena cava is normal in size with greater than 50% respiratory variability, suggesting right atrial pressure of 3 mmHg. IAS/Shunts: No atrial level shunt detected by color flow Doppler.  LEFT VENTRICLE PLAX 2D LVIDd:         3.60 cm   Diastology LVIDs:         2.20 cm   LV e' medial:  6.20 cm/s LV PW:         1.00 cm   LV e' lateral: 6.74 cm/s LV IVS:        1.10 cm LVOT diam:     1.90 cm LV SV:         38 LV SV Index:   23 LVOT Area:     2.84 cm  RIGHT VENTRICLE            IVC RV S prime:     7.83 cm/s  IVC diam: 1.30 cm TAPSE (M-mode): 1.2 cm LEFT ATRIUM           Index LA diam:      4.50 cm 2.66 cm/m LA Vol (A2C): 18.6 ml 11.01 ml/m LA Vol (A4C): 34.1 ml 20.19 ml/m  AORTIC VALVE LVOT Vmax:   67.10 cm/s LVOT Vmean:  48.700 cm/s LVOT VTI:    0.135 m  AORTA Ao Root diam: 2.80 cm Ao Asc diam:  3.30 cm  SHUNTS  Systemic VTI:  0.14 m  Systemic Diam: 1.90 cm Chyrl Crawford Tobb DO Electronically signed by Jerryl Morin DO Signature Date/Time: 09/11/2023/3:13:49 PM    Final    MR ANGIO HEAD WO CONTRAST Result Date: 09/10/2023 CLINICAL DATA:  Follow-up examination for acute stroke. EXAM: MRA HEAD WITHOUT CONTRAST TECHNIQUE: Angiographic images of the Circle of Willis were acquired using MRA technique without intravenous contrast. COMPARISON:  Prior brain MRI from earlier the same day. FINDINGS: Anterior circulation: Both internal carotid arteries are patent through the siphons without stenosis or other abnormality. A1 segments patent bilaterally. Right A1 is hypoplastic, accounting for the diminutive right ICA is compared to the left. Normal anterior communicating artery complex. Both ACAs patent to their distal aspects without significant stenosis. Tiny outpouching arising from the proximal A2 segment noted, which appears to be consistent with a small vascular infundibulum on 3D source time-of-flight sequence. No M1 stenosis or occlusion. Distal MCA branches perfused and symmetric. Posterior circulation: Both V4 segments patent without stenosis. Both PICA patent. Basilar patent without stenosis. Superior cerebral arteries patent bilaterally. Left PCA supplied via the basilar. Fetal type origin of the right PCA. Both PCAs patent without stenosis. Anatomic variants: As above. Other: None. IMPRESSION: Negative intracranial MRA. No large vessel occlusion or hemodynamically significant stenosis. Electronically Signed   By: Virgia Griffins M.D.   On: 09/10/2023 20:23   MR ORBITS W WO CONTRAST Result Date: 09/10/2023 CLINICAL DATA:  Initial evaluation for acute left-sided visual loss. EXAM: MRI OF THE ORBITS WITHOUT AND WITH CONTRAST TECHNIQUE: Multiplanar, multi-echo pulse sequences of the orbits and surrounding structures were acquired including fat saturation techniques, before and after intravenous contrast administration. CONTRAST:  7mL GADAVIST  GADOBUTROL 1 MMOL/ML IV SOLN COMPARISON:  Prior brain MRI from earlier the same day. FINDINGS: Orbits: Globes are symmetric in size with normal appearance and morphology. Prior bilateral ocular lens replacement. Optic nerves fairly symmetric and within normal limits. No abnormal edema or enhancement about either optic nerve or optic nerve sheath complex. Intraconal and extraconal fat maintained. Extra-ocular muscles symmetric and within normal limits. Lacrimal glands within normal limits. No abnormality about the cavernous sinus or suprasellar cistern. Visualized sinuses: Mild mucosal thickening present about the sphenoethmoidal sinuses. Paranasal sinuses are otherwise clear. Left-to-right nasal septal deviation with associated concha bullosa. Soft tissues: Periorbital soft tissues within normal limits. Limited intracranial: Atrophy with chronic small vessel ischemic disease, described on prior brain MRI. 8 mm meningioma overlying the anterior right frontal convexity without mass effect. IMPRESSION: 1. Normal MRI of the orbits. 2. 8 mm meningioma overlying the anterior right frontal convexity without mass effect. 3. Underlying atrophy with chronic small vessel ischemic disease. Electronically Signed   By: Virgia Griffins M.D.   On: 09/10/2023 20:20   MR BRAIN WO CONTRAST Result Date: 09/10/2023 CLINICAL DATA:  Provided history: Vision loss, monocular. Left eye vision loss. Concern for CRAO. EXAM: MRI HEAD WITHOUT CONTRAST TECHNIQUE: Multiplanar, multiecho pulse sequences of the brain and surrounding structures were obtained without intravenous contrast. COMPARISON:  Head CT 09/10/2023. FINDINGS: Brain: Generalized cerebral atrophy. There are multiple small acute infarcts scattered within the bilateral cerebral hemispheres, 10-15 in number. The largest is an 8 mm acute cortically-based infarct within the right parietal lobe (series 2, image 33). 8 mm dural-based mass overlying the anteromedial right frontal  lobe most consistent with a meningioma (series 7, image 20). The mass slightly indents the underlying brain parenchyma. No underlying parenchymal edema. Multifocal T2 FLAIR hyperintense signal abnormality within the cerebral white  matter and pons, nonspecific but compatible with moderate chronic small vessel ischemic disease. Punctate chronic microhemorrhage within the right lentiform nucleus. Prominent perivascular spaces with within the bilateral cerebral hemispheric white matter and within/about the bilateral basal ganglia. Cavum septum pellucidum and cavum vergae (anatomic variant). No extra-axial fluid collection. No midline shift. Vascular: Maintained flow voids within the proximal large arterial vessels. Skull and upper cervical spine: No focal worrisome marrow lesion. Incompletely assessed cervical spondylosis. Sinuses/Orbits: No mass or acute finding within the imaged orbits. No significant paranasal sinus disease. IMPRESSION: 1. Multiple small acute infarcts scattered within the bilateral cerebral hemispheres, 10-15 in number and measuring up to 8 mm. Given involvement of multiple vascular territories, findings suggest sequelae of an embolic process. 2. 8 mm meningioma overlying the anterior right frontal lobe. The mass slightly indents the underlying brain parenchyma without underlying parenchymal edema. 3. Background parenchymal atrophy and chronic small vessel ischemic disease. Electronically Signed   By: Bascom Lily D.O.   On: 09/10/2023 15:28   CT HEAD WO CONTRAST Result Date: 09/10/2023 CLINICAL DATA:  Neuro deficit, acute, stroke suspected. EXAM: CT HEAD WITHOUT CONTRAST TECHNIQUE: Contiguous axial images were obtained from the base of the skull through the vertex without intravenous contrast. RADIATION DOSE REDUCTION: This exam was performed according to the departmental dose-optimization program which includes automated exposure control, adjustment of the mA and/or kV according to patient size  and/or use of iterative reconstruction technique. COMPARISON:  None Available. FINDINGS: Brain: Age related volume loss. Chronic small-vessel ischemic changes of the white matter and basal ganglia. Probable small vessel change of the pons. No evidence of acute infarction, mass lesion, hemorrhage, hydrocephalus or extra-axial collection. Vascular: There is atherosclerotic calcification of the major vessels at the base of the brain. Skull: Negative Sinuses/Orbits: Clear/normal Other: None IMPRESSION: No acute CT finding. Age related volume loss. Chronic small-vessel ischemic changes of the white matter and basal ganglia. Electronically Signed   By: Bettylou Brunner M.D.   On: 09/10/2023 14:20    Microbiology: No results found for this or any previous visit (from the past 240 hours).   Labs: Basic Metabolic Panel: Recent Labs  Lab 09/10/23 1223 09/10/23 1226 09/10/23 1929 09/13/23 0649  NA 134* 135  --  135  K 4.1 4.3  --  3.7  CL 98 98  --  99  CO2  --  22  --  25  GLUCOSE 144* 139*  --  131*  BUN 13 13  --  16  CREATININE 0.60 0.67 0.70 0.71  CALCIUM   --  9.1  --  8.3*   Liver Function Tests: Recent Labs  Lab 09/10/23 1226  AST 19  ALT 10  ALKPHOS 84  BILITOT 0.6  PROT 6.8  ALBUMIN 3.6   No results for input(s): "LIPASE", "AMYLASE" in the last 168 hours. No results for input(s): "AMMONIA" in the last 168 hours. CBC: Recent Labs  Lab 09/10/23 1223 09/10/23 1226 09/10/23 1929  WBC  --  9.4 11.4*  NEUTROABS  --  4.8  --   HGB 16.0* 15.0 13.7  HCT 47.0* 44.4 41.2  MCV  --  89.2 89.6  PLT  --  481* 433*   Cardiac Enzymes: No results for input(s): "CKTOTAL", "CKMB", "CKMBINDEX", "TROPONINI" in the last 168 hours. BNP: BNP (last 3 results) No results for input(s): "BNP" in the last 8760 hours.  ProBNP (last 3 results) No results for input(s): "PROBNP" in the last 8760 hours.  CBG: Recent Labs  Lab  09/12/23 1224 09/12/23 1637 09/12/23 2135 09/13/23 0604  09/13/23 1201  GLUCAP 154* 136* 150* 125* 138*       Signed:  Deforest Fast MD.  [facility] 09/13/2023, 12:21 PM

## 2023-09-13 NOTE — Consult Note (Signed)
 ELECTROPHYSIOLOGY CONSULT NOTE  Patient ID: Annette Gross MRN: [id number], DOB/AGE: [birth year]   Admit date: 09/10/2023 Date of Consult: 09/13/2023  Primary Physician: Karalee Oscar, PA Primary Cardiologist: none Reason for Consultation: Cryptogenic stroke ; recommendations regarding Implantable Loop Recorder by Palikh  History of Present Illness Annette Gross was admitted on 09/10/2023 with L eye visioin changes > ER via ophthalmologist > stoke.    PMHx includes: asthma, DM, HTN, GERD, Factor V def (elsewhere charted says patient has factor V Leiden but this is an error), thyroidectomy, depression, fibromyalgia, IBS, hyperlipidemia and peripheral neuropathy   Neurology notes: Acute Ischemic Infarct: Left eye CRAO and multiple small scattered ischemic infarcts Etiology: Likely embolic. Consider loop recorder 30-day cardiac monitor at discharge Consider TEE  she has undergone workup for stroke including echocardiogram and is pending her carotid doppler and LE venous US  as well.  The patient has been monitored on telemetry which has demonstrated sinus rhythm with no arrhythmias.  Inpatient stroke work-up is to be completed with a TEE.   Echocardiogram this admission demonstrated  1. Left ventricular ejection fraction, by estimation, is 60 to 65%. The  left ventricle has normal function. The left ventricle has no regional  wall motion abnormalities. There is mild concentric left ventricular  hypertrophy. Left ventricular diastolic  parameters are indeterminate.   2. Right ventricular systolic function is mildly reduced. The right  ventricular size is mildly enlarged.   3. A small pericardial effusion is present. The pericardial effusion is  circumferential.   4. The mitral valve is normal in structure. No evidence of mitral valve  regurgitation. No evidence of mitral stenosis.   5. The aortic valve is calcified. There is mild calcification of the  aortic valve. There is moderate  thickening of the aortic valve. Aortic  valve regurgitation is not visualized. No aortic stenosis is present.   6. The inferior vena cava is normal in size with greater than 50%  respiratory variability, suggesting right atrial pressure of 3 mmHg.     Lab work is reviewed  Prior to admission, the patient denies chest pain, shortness of breath, dizziness, palpitations, or syncope.  They are recovering from their stroke with plans to home at discharge.   Past Medical History:  Diagnosis Date   Anemia    many yrs ago   Anxiety    Asthma    no recent issues   Cancer [facility])    s/p thyroidectomy 2018   Complication of anesthesia    Cystitis, interstitial    Degenerative disc disease, lumbar    Degenerative joint disease    spinal stenosis "Chronic low back pain"   Depression    Diabetes mellitus    diet control.   Diverticulitis 2005   Diverticulosis 2005   Factor V deficiency (HCC)    Fibromyalgia    Fracture of right foot 1995   GERD (gastroesophageal reflux disease)    History of hiatal hernia    Hyperlipidemia    Hypertension    off med x 5 yrs.   IBS (irritable bowel syndrome)    Insomnia    Iron deficiency    Liver hemangioma 1999   MRI   Mononucleosis    Morton's neuroma    Left foot   Nuclear sclerotic cataract of left eye 08/29/2019   Peripheral neuropathy    Pneumonia    years 89 & 89.  None since   PONV (postoperative nausea and vomiting)  nausea no vomiting   Rectocele    Transfusion history    child x2, many yrs ago after childbirth"hemorrhage"   Umbilical hernia 2012     Surgical History:  Past Surgical History:  Procedure Laterality Date   blood vessel tumor removal  1980   from chin   CHOLECYSTECTOMY     COLONOSCOPY WITH PROPOFOL  N/A 09/02/2015   Procedure: COLONOSCOPY WITH PROPOFOL ;  Surgeon: Garrett Kallman, MD;  Location: [facility];  Service: Endoscopy;  Laterality: N/A;   GANGLION CYST EXCISION     right   HEMORRHOID SURGERY      HERNIA REPAIR     repair was aimed at the [facility]   HIATAL HERNIA REPAIR     and nissen fundoplication   LAPAROSCOPIC ESOPHAGOGASTRIC FUNDOPLASTY     LAPAROSCOPIC INCISIONAL / UMBILICAL / VENTRAL HERNIA REPAIR     umbilical hernia   OOPHORECTOMY     left   RIGHT OOPHORECTOMY     '05-laparaoscopic   SHOULDER SURGERY Right    THYROIDECTOMY N/A 07/06/2016   Procedure: TOTAL THYROIDECTOMY;  Surgeon: Adalberto Hollow, MD;  Location: [facility] OR;  Service: General;  Laterality: N/A;   TONSILLECTOMY     TOTAL THYROIDECTOMY  07/06/2016     Medications Prior to Admission  Medication Sig Dispense Refill Last Dose/Taking   acetaminophen  (TYLENOL ) 500 MG tablet Take 1,000 mg by mouth 2 (two) times daily as needed for moderate pain (pain score 4-6) or headache.   Past Week   Cholecalciferol (VITAMIN D-3 PO) Take 1 capsule by mouth daily.   09/09/2023 Morning   Cyanocobalamin  (VITAMIN B-12 PO) Take 1 tablet by mouth daily.   09/09/2023 Morning   DULoxetine  (CYMBALTA ) 60 MG capsule Take 60 mg by mouth at bedtime.   09/09/2023 Bedtime   Evolocumab (REPATHA) 140 MG/ML SOSY Inject 140 mg into the skin See admin instructions. Inject 1 dose (140mg ) subcutaneously every 14 days on every other Tuesday.   Past Month   fluticasone  (FLONASE ) 50 MCG/ACT nasal spray Place 1 spray into the nose daily.   09/09/2023 Morning   levocetirizine (XYZAL ) 5 MG tablet Take 5 mg by mouth at bedtime.  3 09/09/2023 Bedtime   levothyroxine  (SYNTHROID ) 88 MCG tablet Take 88 mcg by mouth daily before breakfast.   09/10/2023 Morning   losartan (COZAAR) 25 MG tablet Take 25 mg by mouth at bedtime.   09/09/2023 Bedtime   metFORMIN  (GLUCOPHAGE ) 500 MG tablet Take 1,000 mg by mouth 2 (two) times daily after a meal.   09/09/2023 Evening   Multiple Vitamins-Minerals (PRESERVISION AREDS 2) CAPS Take 1 capsule by mouth daily.   09/09/2023 Morning   omeprazole (PRILOSEC) 40 MG capsule Take 40 mg by mouth at bedtime.   09/09/2023 Bedtime    Inpatient Medications:     stroke: early stages of recovery book   Does not apply Once   aspirin EC  81 mg Oral Daily   clopidogrel  75 mg Oral Daily   DULoxetine   60 mg Oral QHS   enoxaparin  (LOVENOX ) injection  40 mg Subcutaneous QHS   insulin  aspart  0-9 Units Subcutaneous TID WC   levothyroxine   88 mcg Oral Q0600   losartan  25 mg Oral Daily   pantoprazole   40 mg Oral QHS   sodium chloride  flush  3 mL Intravenous Once   sodium chloride  flush  3-10 mL Intravenous Q12H    Allergies:  Allergies  Allergen Reactions   Iohexol Anaphylaxis   Ivp Dye [Iodinated Contrast  Media] Anaphylaxis   Kiwi Extract Anaphylaxis   Neurontin [Gabapentin] Other (See Comments)    Auditory hallucinations   Sulfa Antibiotics Hives   Asa [Aspirin] Other (See Comments)    Stomach pain   Cipro [Ciprofloxacin Hcl] Other (See Comments)    Tendonitis    Lexapro [Escitalopram Oxalate] Other (See Comments)    Myalgias    Lipitor [Atorvastatin] Other (See Comments)    Myalgias    Lomotil [Diphenoxylate-Atropine] Other (See Comments)    Severe stomach pain/cramps Dizziness   Lyrica [Pregabalin] Other (See Comments)    Auditory hallucinations   Codeine Other (See Comments)    Severe stomach cramps   Diphenoxylate-Atropine Other (See Comments)    Severe stomach and dizziness    Social History   Socioeconomic History   Marital status: Married    Spouse name: Not on file   Number of children: Not on file   Years of education: Not on file   Highest education level: Not on file  Occupational History   Not on file  Tobacco Use   Smoking status: Never   Smokeless tobacco: Never  Vaping Use   Vaping status: Never Used  Substance and Sexual Activity   Alcohol use: Yes    Alcohol/week: 2.0 standard drinks of alcohol    Types: 2 Glasses of wine per week    Comment: social   Drug use: No   Sexual activity: Not on file  Other Topics Concern   Not on file  Social History Narrative   Not on file   Social Drivers of Health    Financial Resource Strain: Not on file  Food Insecurity: No Food Insecurity (09/10/2023)   Hunger Vital Sign    Worried About Running Out of Food in the Last Year: Never true    Ran Out of Food in the Last Year: Never true  Transportation Needs: No Transportation Needs (09/10/2023)   PRAPARE - Administrator, Civil Service (Medical): No    Lack of Transportation (Non-Medical): No  Physical Activity: Not on file  Stress: Not on file  Social Connections: Socially Isolated (09/10/2023)   Social Connection and Isolation Panel [NHANES]    Frequency of Communication with Friends and Family: Twice a week    Frequency of Social Gatherings with Friends and Family: More than three times a week    Attends Religious Services: Never    Database administrator or Organizations: No    Attends Banker Meetings: Never    Marital Status: Widowed  Intimate Partner Violence: Not At Risk (09/10/2023)   Humiliation, Afraid, Rape, and Kick questionnaire    Fear of Current or Ex-Partner: No    Emotionally Abused: No    Physically Abused: No    Sexually Abused: No     Family History  Problem Relation Age of Onset   Stroke Father    Heart disease Father       Review of Systems: All other systems reviewed and are otherwise negative except as noted above.  Physical Exam: Vitals:   09/12/23 1638 09/12/23 1930 09/12/23 2300 09/13/23 0300  BP: (!) 151/89 136/64 (!) 156/70 (!) 149/62  Pulse: (!) 101 94 92 99  Resp: 18 18 18 18   Temp: 98.3 F (36.8 C) 97.8 F (36.6 C) 97.7 F (36.5 C) 99.5 F (37.5 C)  TempSrc: Oral Axillary Axillary Axillary  SpO2: 96% 95% 97% 98%  Weight:      Height:  GEN- The patient is well appearing, alert and oriented x 3 today.   Head- normocephalic, atraumatic Eyes-  Sclera clear, conjunctiva pink Ears- hearing intact Oropharynx- clear Neck- supple Lungs- CTA b/l, normal work of breathing Heart- RRR, no murmurs, rubs or gallops  GI-  soft, NT, ND Extremities- no clubbing, cyanosis, or edema MS- no significant deformity or atrophy Skin- no rash or lesion Psych- euthymic mood, full affect   Labs:   Lab Results  Component Value Date   WBC 11.4 (H) 09/10/2023   HGB 13.7 09/10/2023   HCT 41.2 09/10/2023   MCV 89.6 09/10/2023   PLT 433 (H) 09/10/2023    Recent Labs  Lab 09/10/23 1226 09/10/23 1929  NA 135  --   K 4.3  --   CL 98  --   CO2 22  --   BUN 13  --   CREATININE 0.67 0.70  CALCIUM  9.1  --   PROT 6.8  --   BILITOT 0.6  --   ALKPHOS 84  --   ALT 10  --   AST 19  --   GLUCOSE 139*  --    No results found for: "CKTOTAL", "CKMB", "CKMBINDEX", "TROPONINI" Lab Results  Component Value Date   CHOL 128 09/11/2023   CHOL  07/08/2007    160        ATP III CLASSIFICATION:  <200     mg/dL   Desirable  034-742  mg/dL   Borderline High  >=595    mg/dL   High   CHOL  63/87/5643    155        ATP III CLASSIFICATION:  <200     mg/dL   Desirable  329-518  mg/dL   Borderline High  >=841    mg/dL   High   Lab Results  Component Value Date   HDL 52 09/11/2023   Lab Results  Component Value Date   LDLCALC 30 09/11/2023   Lab Results  Component Value Date   TRIG 230 (H) 09/11/2023   TRIG 347 (H) 07/08/2007   TRIG 165 (H) 07/02/2007   Lab Results  Component Value Date   CHOLHDL 2.5 09/11/2023   No results found for: "LDLDIRECT"  No results found for: "DDIMER"   Radiology/Studies:  MR ANGIO HEAD WO CONTRAST Result Date: 09/10/2023 CLINICAL DATA:  Follow-up examination for acute stroke. EXAM: MRA HEAD WITHOUT CONTRAST TECHNIQUE: Angiographic images of the Circle of Willis were acquired using MRA technique without intravenous contrast. COMPARISON:  Prior brain MRI from earlier the same day. FINDINGS: Anterior circulation: Both internal carotid arteries are patent through the siphons without stenosis or other abnormality. A1 segments patent bilaterally. Right A1 is hypoplastic, accounting for the  diminutive right ICA is compared to the left. Normal anterior communicating artery complex. Both ACAs patent to their distal aspects without significant stenosis. Tiny outpouching arising from the proximal A2 segment noted, which appears to be consistent with a small vascular infundibulum on 3D source time-of-flight sequence. No M1 stenosis or occlusion. Distal MCA branches perfused and symmetric. Posterior circulation: Both V4 segments patent without stenosis. Both PICA patent. Basilar patent without stenosis. Superior cerebral arteries patent bilaterally. Left PCA supplied via the basilar. Fetal type origin of the right PCA. Both PCAs patent without stenosis. Anatomic variants: As above. Other: None. IMPRESSION: Negative intracranial MRA. No large vessel occlusion or hemodynamically significant stenosis. Electronically Signed   By: Virgia Griffins M.D.   On: 09/10/2023 20:23   MR ORBITS  W WO CONTRAST Result Date: 09/10/2023 CLINICAL DATA:  Initial evaluation for acute left-sided visual loss. EXAM: MRI OF THE ORBITS WITHOUT AND WITH CONTRAST TECHNIQUE: Multiplanar, multi-echo pulse sequences of the orbits and surrounding structures were acquired including fat saturation techniques, before and after intravenous contrast administration. CONTRAST:  7mL GADAVIST GADOBUTROL 1 MMOL/ML IV SOLN COMPARISON:  Prior brain MRI from earlier the same day. FINDINGS: Orbits: Globes are symmetric in size with normal appearance and morphology. Prior bilateral ocular lens replacement. Optic nerves fairly symmetric and within normal limits. No abnormal edema or enhancement about either optic nerve or optic nerve sheath complex. Intraconal and extraconal fat maintained. Extra-ocular muscles symmetric and within normal limits. Lacrimal glands within normal limits. No abnormality about the cavernous sinus or suprasellar cistern. Visualized sinuses: Mild mucosal thickening present about the sphenoethmoidal sinuses. Paranasal  sinuses are otherwise clear. Left-to-right nasal septal deviation with associated concha bullosa. Soft tissues: Periorbital soft tissues within normal limits. Limited intracranial: Atrophy with chronic small vessel ischemic disease, described on prior brain MRI. 8 mm meningioma overlying the anterior right frontal convexity without mass effect. IMPRESSION: 1. Normal MRI of the orbits. 2. 8 mm meningioma overlying the anterior right frontal convexity without mass effect. 3. Underlying atrophy with chronic small vessel ischemic disease. Electronically Signed   By: Virgia Griffins M.D.   On: 09/10/2023 20:20   MR BRAIN WO CONTRAST Result Date: 09/10/2023 CLINICAL DATA:  Provided history: Vision loss, monocular. Left eye vision loss. Concern for CRAO. EXAM: MRI HEAD WITHOUT CONTRAST TECHNIQUE: Multiplanar, multiecho pulse sequences of the brain and surrounding structures were obtained without intravenous contrast. COMPARISON:  Head CT 09/10/2023. FINDINGS: Brain: Generalized cerebral atrophy. There are multiple small acute infarcts scattered within the bilateral cerebral hemispheres, 10-15 in number. The largest is an 8 mm acute cortically-based infarct within the right parietal lobe (series 2, image 33). 8 mm dural-based mass overlying the anteromedial right frontal lobe most consistent with a meningioma (series 7, image 20). The mass slightly indents the underlying brain parenchyma. No underlying parenchymal edema. Multifocal T2 FLAIR hyperintense signal abnormality within the cerebral white matter and pons, nonspecific but compatible with moderate chronic small vessel ischemic disease. Punctate chronic microhemorrhage within the right lentiform nucleus. Prominent perivascular spaces with within the bilateral cerebral hemispheric white matter and within/about the bilateral basal ganglia. Cavum septum pellucidum and cavum vergae (anatomic variant). No extra-axial fluid collection. No midline shift. Vascular:  Maintained flow voids within the proximal large arterial vessels. Skull and upper cervical spine: No focal worrisome marrow lesion. Incompletely assessed cervical spondylosis. Sinuses/Orbits: No mass or acute finding within the imaged orbits. No significant paranasal sinus disease. IMPRESSION: 1. Multiple small acute infarcts scattered within the bilateral cerebral hemispheres, 10-15 in number and measuring up to 8 mm. Given involvement of multiple vascular territories, findings suggest sequelae of an embolic process. 2. 8 mm meningioma overlying the anterior right frontal lobe. The mass slightly indents the underlying brain parenchyma without underlying parenchymal edema. 3. Background parenchymal atrophy and chronic small vessel ischemic disease. Electronically Signed   By: Bascom Lily D.O.   On: 09/10/2023 15:28   CT HEAD WO CONTRAST Result Date: 09/10/2023 CLINICAL DATA:  Neuro deficit, acute, stroke suspected. EXAM: CT HEAD WITHOUT CONTRAST TECHNIQUE: Contiguous axial images were obtained from the base of the skull through the vertex without intravenous contrast. RADIATION DOSE REDUCTION: This exam was performed according to the departmental dose-optimization program which includes automated exposure control, adjustment of the mA and/or kV according  to patient size and/or use of iterative reconstruction technique. COMPARISON:  None Available. FINDINGS: Brain: Age related volume loss. Chronic small-vessel ischemic changes of the white matter and basal ganglia. Probable small vessel change of the pons. No evidence of acute infarction, mass lesion, hemorrhage, hydrocephalus or extra-axial collection. Vascular: There is atherosclerotic calcification of the major vessels at the base of the brain. Skull: Negative Sinuses/Orbits: Clear/normal Other: None IMPRESSION: No acute CT finding. Age related volume loss. Chronic small-vessel ischemic changes of the white matter and basal ganglia. Electronically Signed   By:  Bettylou Brunner M.D.   On: 09/10/2023 14:20    12-lead ECG ST, ATach, no AFib All prior EKG's in EPIC reviewed with no documented atrial fibrillation  Telemetry she arrives in a tachycradia that appears sinus > SR, no AFib  Assessment and Plan:  1. Cryptogenic stroke The patient presents with cryptogenic stroke.  The patient has a TEE planned for this today.  Dr. Marven Slimmer spoke at length with the patient rational for monitoring for afib w/loop recorder.  While she has never had AFib that she is aware, she is familiar given her husband has it.  Risks, benefits, and alteratives to implantable loop recorder were discussed with the patient today.   At this time, the patient is very clear in herr decision to proceed with implantable loop recorder, pending her TEE/carotid, completion of her stroke w/u  Wound care was reviewed with the patient (keep incision clean and dry for 3 days).  Wound check follow up will be scheduled for the patient.  Please call with questions.   Kashay Cavenaugh Randol Butt, PA-C 09/13/2023

## 2023-09-13 NOTE — Plan of Care (Addendum)
 Came back from loop recorder. Dressing intact to mid chest.  No drainage.  Reminded not to raise arms up above head and to watch for bleeding.  Now resting in bed waiting for recorder rep to come to bedside to explain loop recorder and go over what she needs to know for when she goes home.  Said she feels much better and is ready to go home.  No more anxiety or nausea.    Problem: Education: Goal: Knowledge of disease or condition will improve Outcome: Progressing   Problem: Ischemic Stroke/TIA Tissue Perfusion: Goal: Complications of ischemic stroke/TIA will be minimized Outcome: Progressing   Problem: Coping: Goal: Will verbalize positive feelings about self Outcome: Progressing   Problem: Coping: Goal: Will identify appropriate support needs Outcome: Progressing   Problem: Self-Care: Goal: Ability to participate in self-care as condition permits will improve Outcome: Progressing   Problem: Clinical Measurements: Goal: Respiratory complications will improve Outcome: Progressing   Problem: Activity: Goal: Risk for activity intolerance will decrease Outcome: Progressing   Problem: Nutrition: Goal: Adequate nutrition will be maintained Outcome: Progressing   Problem: Coping: Goal: Level of anxiety will decrease Outcome: Progressing

## 2023-09-13 NOTE — Progress Notes (Signed)
 Reviewed AVS with patient and patients son. Both patient and son verbalized understanding of new medication and where to pick up meds. All questions were answered. Reviewed appointments for patient to follow-up with provider.

## 2023-09-13 NOTE — Progress Notes (Signed)
 Carotid artery duplex and bilateral lower extremity venous duplex has been completed. Preliminary results can be found in CV Proc through chart review.   09/13/23 11:54 AM Birda Buffy RVT

## 2023-09-13 NOTE — TOC Transition Note (Signed)
 Transition of Care [facility]) - Discharge Note   Patient Details  Name: Annette Gross MRN: [id number] Date of Birth: Aug 08, 1940  [facility] CM/SW Contact:  Jonathan Neighbor, RN Phone Number: 09/13/2023, 12:49 PM   Clinical Narrative:     Patient is from home with her son and his significant other. She has someone with her most of the time.  She manages her own medications.  Son provides needed transportation.  DME at home: rollator/ tub seat/ cane Pt asked to have outpatient PT at [facility]. CM has faxed [facility] the patients inforamtion: [phone]. She will schedule the first appointment in [facility].  She was in agreement with having her outpt OT/ST at a different location. This was arranged with [facility]. Information on the AVS.  Pt has transportation home.   Final next level of care: OP Rehab Barriers to Discharge: No Barriers Identified   Patient Goals and CMS Choice     Choice offered to / list presented to : Patient      Discharge Placement                       Discharge Plan and Services Additional resources added to the After Visit Summary for                                       Social Drivers of Health (SDOH) Interventions SDOH Screenings   Food Insecurity: No Food Insecurity (09/10/2023)  Housing: Low Risk  (09/10/2023)  Transportation Needs: No Transportation Needs (09/10/2023)  Utilities: Not At Risk (09/10/2023)  Social Connections: Socially Isolated (09/10/2023)  Tobacco Use: Low Risk  (09/10/2023)     Readmission Risk Interventions     No data to display

## 2023-09-13 NOTE — Anesthesia Preprocedure Evaluation (Addendum)
 Anesthesia Evaluation  Patient identified by MRN, date of birth, ID band Patient awake    Reviewed: Allergy & Precautions, H&P , NPO status , Patient's Chart, lab work & pertinent test results  History of Anesthesia Complications (+) PONV and history of anesthetic complications  Airway Mallampati: II  TM Distance: >3 FB Neck ROM: Full    Dental no notable dental hx. (+) Teeth Intact, Dental Advisory Given   Pulmonary neg pulmonary ROS, asthma , pneumonia   Pulmonary exam normal breath sounds clear to auscultation       Cardiovascular Exercise Tolerance: Good hypertension, Pt. on medications negative cardio ROS Normal cardiovascular exam Rhythm:Regular Rate:Normal  ECHO 5/25   1. Left ventricular ejection fraction, by estimation, is 60 to 65%. The  left ventricle has normal function. The left ventricle has no regional  wall motion abnormalities. There is mild concentric left ventricular  hypertrophy. Left ventricular diastolic  parameters are indeterminate.   2. Right ventricular systolic function is mildly reduced. The right  ventricular size is mildly enlarged.   3. A small pericardial effusion is present. The pericardial effusion is  circumferential.   4. The mitral valve is normal in structure. No evidence of mitral valve  regurgitation. No evidence of mitral stenosis.   5. The aortic valve is calcified. There is mild calcification of the  aortic valve. There is moderate thickening of the aortic valve. Aortic  valve regurgitation is not visualized. No aortic stenosis is present.   6. The inferior vena cava is normal in size with greater than 50%  respiratory variability, suggesting right atrial pressure of 3 mmHg.      Neuro/Psych  PSYCHIATRIC DISORDERS Anxiety Depression     Neuromuscular disease CVA negative neurological ROS  negative psych ROS   GI/Hepatic negative GI ROS, Neg liver ROS, hiatal hernia,GERD   Medicated,,  Endo/Other  negative endocrine ROSdiabetes, Well Controlled    Renal/GU negative Renal ROS  negative genitourinary   Musculoskeletal negative musculoskeletal ROS (+) Arthritis ,  Fibromyalgia -  Abdominal   Peds negative pediatric ROS (+)  Hematology negative hematology ROS (+) Blood dyscrasia, anemia   Anesthesia Other Findings   Reproductive/Obstetrics negative OB ROS                             Anesthesia Physical Anesthesia Plan  ASA: 3  Anesthesia Plan: MAC   Post-op Pain Management: Minimal or no pain anticipated   Induction: Intravenous  PONV Risk Score and Plan: 2 and Treatment may vary due to age or medical condition and Propofol  infusion  Airway Management Planned: Simple Face Mask and Natural Airway  Additional Equipment: None  Intra-op Plan:   Post-operative Plan:   Informed Consent: I have reviewed the patients History and Physical, chart, labs and discussed the procedure including the risks, benefits and alternatives for the proposed anesthesia with the patient or authorized representative who has indicated his/her understanding and acceptance.       Plan Discussed with: Anesthesiologist  Anesthesia Plan Comments:         Anesthesia Quick Evaluation

## 2023-09-13 NOTE — Progress Notes (Signed)
 Back to room after loop recorder placed.  Resting comfortably.

## 2023-09-13 NOTE — Progress Notes (Addendum)
 STROKE TEAM PROGRESS NOTE    INTERIM HISTORY/SUBJECTIVE Patient is sitting on the side of the bed in NAD.  Patient states still missing art of her vision, in left upper quadrant but it seems as if its improving.  No new neurological events noted. She would like to go home today   TEE EF 60-65%, No LAA thrombus. Negative for PFO  CBC     Component Value Date/Time   WBC 11.4 (H) 09/10/2023 1929   RBC 4.60 09/10/2023 1929   HGB 13.7 09/10/2023 1929   HCT 41.2 09/10/2023 1929   PLT 433 (H) 09/10/2023 1929   MCV 89.6 09/10/2023 1929   MCH 29.8 09/10/2023 1929   MCHC 33.3 09/10/2023 1929   RDW 13.6 09/10/2023 1929   LYMPHSABS 3.2 09/10/2023 1226   MONOABS 1.1 (H) 09/10/2023 1226   EOSABS 0.2 09/10/2023 1226   BASOSABS 0.1 09/10/2023 1226    BMET    Component Value Date/Time   NA 135 09/13/2023 0649   K 3.7 09/13/2023 0649   CL 99 09/13/2023 0649   CO2 25 09/13/2023 0649   GLUCOSE 131 (H) 09/13/2023 0649   BUN 16 09/13/2023 0649   CREATININE 0.71 09/13/2023 0649   CALCIUM  8.3 (L) 09/13/2023 0649   GFRNONAA >60 09/13/2023 0649    IMAGING past 24 hours EP STUDY Result Date: 09/13/2023 See surgical note for result.   Vitals:   09/13/23 1050 09/13/23 1100 09/13/23 1110 09/13/23 1202  BP: 107/65 111/61 114/62 103/70  Pulse: 95 90 88 84  Resp: 17 19 20 19   Temp:   97.6 F (36.4 C) 97.8 F (36.6 C)  TempSrc:   Temporal Oral  SpO2: 98% 92% 92% 96%  Weight:      Height:         PHYSICAL EXAM General:  Alert, well-nourished, well-developed patient in no acute distress Psych:  Mood and affect appropriate for situation CV: Regular rate and rhythm on monitor Respiratory:  Regular, unlabored respirations on room air   NEURO:  Mental Status: AA&Ox3, patient is able to give clear and coherent history Speech/Language: speech is without dysarthria or aphasia.    Cranial Nerves:  II: PERRL. Significant upper visual field deficit in left eye remains III, IV, VI: EOMI.  Eyelids elevate symmetrically.  V: Sensation is intact to light touch and symmetrical to face.  VII: Face is symmetrical resting and smiling VIII: hearing intact to voice. IX, X: Palate elevates symmetrically. Phonation is normal.  UE:[id number] shrug 5/5. XII: tongue is midline without fasciculations. Motor: 5/5 strength to all muscle groups tested.  Tone: is normal and bulk is normal Sensation- Intact to light touch bilaterally.  Coordination: FTN intact bilaterally Gait- deferred  Most Recent NIH 1 for vision.   ASSESSMENT/PLAN  Ms. Annette Gross is a 83 y.o. female with history of asthma, diabetes, hypertension, GERD, factor V deficiency (elsewhere charted says patient has factor V Leiden but this is an error), thyroidectomy, depression, fibromyalgia, IBS, hyperlipidemia and peripheral neuropathy admitted for painless loss of vision in the central field of the left eye.  She was seen by her ophthalmologist who diagnosed her with a CRAO and sent her to the emergency department for further evaluation.  On MRI, she was found to have multiple embolic appearing ischemic strokes.  NIH on Admission 1  Stroke: Left eye CRAO and multiple small scattered ischemic infarcts Etiology: Cardioembolic source CT head No acute abnormality. Small vessel disease. Atrophy.  MRI multiple small acute infarcts scattered within  bilateral cerebral hemispheres, likely embolic in nature, 8 mm meningioma overlaying anterior right frontal lobe, background atrophy and chronic small vessel ischemic disease MRA no LVO or hemodynamically significant stenosis Carotid Doppler negative Bilateral lower extremity ultrasound negative  2D Echo EF 60 to 65%, no atrial level shunt loop recorder placed today  TEE EF 60-65%, No LAA thrombus. Negative for PFO LDL 30 HgbA1c 6.6 VTE prophylaxis -Lovenox  No antithrombotic prior to admission, now on aspirin 81 mg daily and clopidogrel 75 mg daily for 3 weeks and then aspirin  alone. Therapy recommendations:  Home Health OT Disposition: pending  Hypertension Home meds: Losartan 25 mg daily Stable Start losartan today. Long-term BP goal normotensive  Hyperlipidemia Home meds: Repatha 140 mg every 2 weeks LDL 30, goal < 70 Continue home Repatha at discharge  Other Stroke Risk Factors Obesity, Body mass index is 30.86 kg/m., BMI >/= 30 associated with increased stroke risk, recommend weight loss, diet and exercise as appropriate   Other Active Problems Leukocytosis WBC 9.4-- 11.4  Hospital day # 0   Jonette Nestle DNP, ACNPC-AG  [facility]  ATTENDING NOTE: I reviewed above note and agree with the assessment and plan. Pt was seen and examined.   Left eye can see hand waving except left upper quadrant can clearly counting fingers. Left shoulder chronic pain with limited ROM. Otherwise neuro intact.   For detailed assessment and plan, please refer to above as I have made changes wherever appropriate. Neurology will sign off. Please call with questions. Pt will follow up with stroke clinic NP at [facility] in about 4 weeks. Thanks for the consult.  Consuelo Denmark, MD PhD Stroke Neurology 09/13/2023 3:15 PM       To contact Stroke Continuity provider, please refer to [email or web address]. After hours, contact General Neurology

## 2023-09-13 NOTE — CV Procedure (Signed)
 TRANSESOPHAGEAL ECHOCARDIOGRAM (TEE) NOTE  INDICATIONS: Cryptogenic stroke  PROCEDURE:   Informed consent was obtained prior to the procedure. The risks, benefits and alternatives for the procedure were discussed and the patient comprehended these risks.  Risks include, but are not limited to, cough, sore throat, vomiting, nausea, somnolence, esophageal and stomach trauma or perforation, bleeding, low blood pressure, aspiration, pneumonia, infection, trauma to the teeth and death.    After a procedural time-out, the patient was given propofol  for sedation by anesthesia. See their separate report.  The patient's heart rate, blood pressure, and oxygen saturation are monitored continuously during the procedure.The oropharynx was anesthetized with topical cetacaine.  The transesophageal probe was inserted in the esophagus and stomach without difficulty and multiple views were obtained.  The patient was kept under observation until the patient left the procedure room.  I was present face-to-face 100% of this time. The patient left the procedure room in stable condition.   Agitated microbubble saline contrast was administered twice.  COMPLICATIONS:    There were no immediate complications.  Findings:  LEFT VENTRICLE: The left ventricular wall thickness is mildly increased.  The left ventricular cavity is normal in size. Wall motion is normal.  LVEF is 60-65%.  RIGHT VENTRICLE:  The right ventricle is normal in structure with mildly reduced systolic function and without any thrombus or masses.    LEFT ATRIUM:  The left atrium is normal in size without any thrombus or masses.  There is not spontaneous echo contrast ("smoke") in the left atrium consistent with a low flow state.  LEFT ATRIAL APPENDAGE:  The small left atrial appendage is free of any thrombus or masses. The appendage has single lobes. Pulse doppler indicates moderate flow in the appendage.  ATRIAL SEPTUM:  The atrial septum  appears intact and is free of thrombus and/or masses.  There is no evidence for interatrial shunting by color doppler and saline microbubble, although opacification of the right atrium was suboptimal.  RIGHT ATRIUM:  The right atrium is normal in size and function without any thrombus or masses.  MITRAL VALVE:  The mitral valve is normal in structure and function with Mild regurgitation.  There were no vegetations or stenosis.  AORTIC VALVE:  The aortic valve is trileaflet and sclerotic. There is aortic root calcification. There is no regurgitation.  There were no vegetations or stenosis  TRICUSPID VALVE:  The tricuspid valve is normal in structure and function with trivial regurgitation.  There were no vegetations or stenosis   PULMONIC VALVE:  The pulmonic valve is normal in structure and function with no regurgitation.  There were no vegetations or stenosis.   AORTIC ARCH, ASCENDING AND DESCENDING AORTA:  There was grade 1 Ardell Koller et. Al, 1992) atherosclerosis of the ascending aorta, aortic arch, or proximal descending aorta.  12. PULMONARY VEINS: Anomalous pulmonary venous return was not noted.  13. PERICARDIUM: The pericardium appeared normal and non-thickened.  There is a Small circumferential pericardial effusion.  IMPRESSION:   No LAA thrombus Negative for PFO, although RAA opacification with microbubbles was suboptimal Aortic root calcification with aortic valve sclerosis Mild MR Mildly reduced RV systolic function LVEF 60-65%, normal wall motion  RECOMMENDATIONS:    Would proceed with ILR per cardiac EP.  Time Spent Directly with the Patient:  45 minutes   Hazle Lites, MD, [facility], FNLA, FACP  [place]  [facility]  Medical Director of the Advanced Lipid Disorders &  Cardiovascular Risk Reduction Clinic Diplomate of the  American Board of Clinical Lipidology Attending Cardiologist  Direct Dial: [phone]  Fax: [phone]  Website:   [email or web address] 09/13/2023, 10:37 AM

## 2023-09-13 NOTE — Anesthesia Postprocedure Evaluation (Signed)
 Anesthesia Post Note  Patient: Annette Gross  Procedure(s) Performed: TRANSESOPHAGEAL ECHOCARDIOGRAM     Patient location during evaluation: PACU Anesthesia Type: MAC Level of consciousness: awake and alert Pain management: pain level controlled Vital Signs Assessment: post-procedure vital signs reviewed and stable Respiratory status: spontaneous breathing, nonlabored ventilation, respiratory function stable and patient connected to nasal cannula oxygen Cardiovascular status: stable and blood pressure returned to baseline Postop Assessment: no apparent nausea or vomiting Anesthetic complications: no   No notable events documented.  Last Vitals:  Vitals:   09/13/23 1100 09/13/23 1110  BP: 111/61 114/62  Pulse: 90 88  Resp: 19 20  Temp:  36.4 C  SpO2: 92% 92%    Last Pain:  Vitals:   09/13/23 1110  TempSrc: Temporal  PainSc: 0-No pain                 Ossiel Marchio

## 2023-09-16 DIAGNOSIS — D329 Benign neoplasm of meninges, unspecified: Secondary | ICD-10-CM | POA: Diagnosis not present

## 2023-09-16 DIAGNOSIS — I10 Essential (primary) hypertension: Secondary | ICD-10-CM | POA: Diagnosis not present

## 2023-09-16 DIAGNOSIS — Z8673 Personal history of transient ischemic attack (TIA), and cerebral infarction without residual deficits: Secondary | ICD-10-CM | POA: Diagnosis not present

## 2023-09-30 DIAGNOSIS — H353132 Nonexudative age-related macular degeneration, bilateral, intermediate dry stage: Secondary | ICD-10-CM | POA: Diagnosis not present

## 2023-09-30 DIAGNOSIS — H3412 Central retinal artery occlusion, left eye: Secondary | ICD-10-CM | POA: Diagnosis not present

## 2023-09-30 DIAGNOSIS — Z9889 Other specified postprocedural states: Secondary | ICD-10-CM | POA: Diagnosis not present

## 2023-09-30 DIAGNOSIS — H3581 Retinal edema: Secondary | ICD-10-CM | POA: Diagnosis not present

## 2023-09-30 DIAGNOSIS — E113393 Type 2 diabetes mellitus with moderate nonproliferative diabetic retinopathy without macular edema, bilateral: Secondary | ICD-10-CM | POA: Diagnosis not present

## 2023-09-30 DIAGNOSIS — H353131 Nonexudative age-related macular degeneration, bilateral, early dry stage: Secondary | ICD-10-CM | POA: Diagnosis not present

## 2023-10-08 DIAGNOSIS — M25571 Pain in right ankle and joints of right foot: Secondary | ICD-10-CM | POA: Diagnosis not present

## 2023-10-11 ENCOUNTER — Ambulatory Visit: Admitting: Podiatry

## 2023-10-11 ENCOUNTER — Encounter: Payer: Self-pay | Admitting: Podiatry

## 2023-10-11 DIAGNOSIS — M2142 Flat foot [pes planus] (acquired), left foot: Secondary | ICD-10-CM | POA: Diagnosis not present

## 2023-10-11 DIAGNOSIS — M79674 Pain in right toe(s): Secondary | ICD-10-CM | POA: Diagnosis not present

## 2023-10-11 DIAGNOSIS — M79675 Pain in left toe(s): Secondary | ICD-10-CM

## 2023-10-11 DIAGNOSIS — M2042 Other hammer toe(s) (acquired), left foot: Secondary | ICD-10-CM | POA: Diagnosis not present

## 2023-10-11 DIAGNOSIS — B351 Tinea unguium: Secondary | ICD-10-CM

## 2023-10-11 DIAGNOSIS — E1142 Type 2 diabetes mellitus with diabetic polyneuropathy: Secondary | ICD-10-CM

## 2023-10-11 DIAGNOSIS — M21611 Bunion of right foot: Secondary | ICD-10-CM | POA: Diagnosis not present

## 2023-10-11 DIAGNOSIS — M2041 Other hammer toe(s) (acquired), right foot: Secondary | ICD-10-CM

## 2023-10-11 DIAGNOSIS — M21612 Bunion of left foot: Secondary | ICD-10-CM

## 2023-10-11 DIAGNOSIS — M2141 Flat foot [pes planus] (acquired), right foot: Secondary | ICD-10-CM | POA: Diagnosis not present

## 2023-10-11 NOTE — Progress Notes (Signed)
 Subjective:  Patient ID: Annette Gross, female    DOB: May 30, 1940,   MRN: [id number]  Chief Complaint  Patient presents with   [facility]    Rm22 New Patient Diabetic Foot Care/A1C 6.6/ Plavix     83 y.o. female presents for concern of thickened elongated and painful nails that are difficult to trim. Requesting to have them trimmed today. Relates burning and tingling in their feet. Patient is diabetic and last A1c was  Lab Results  Component Value Date   HGBA1C 6.6 (H) 09/11/2023   .   PCP:  Karalee Oscar, PA    . Denies any other pedal complaints. Denies n/v/f/c.   Past Medical History:  Diagnosis Date   Anemia    many yrs ago   Anxiety    Asthma    no recent issues   Cancer [facility])    s/p thyroidectomy 2018   Complication of anesthesia    Cystitis, interstitial    Degenerative disc disease, lumbar    Degenerative joint disease    spinal stenosis "Chronic low back pain"   Depression    Diabetes mellitus    diet control.   Diverticulitis 2005   Diverticulosis 2005   Factor V deficiency (HCC)    Fibromyalgia    Fracture of right foot 1995   GERD (gastroesophageal reflux disease)    History of hiatal hernia    Hyperlipidemia    Hypertension    off med x 5 yrs.   IBS (irritable bowel syndrome)    Insomnia    Iron deficiency    Liver hemangioma 1999   MRI   Mononucleosis    Morton's neuroma    Left foot   Nuclear sclerotic cataract of left eye 08/29/2019   Peripheral neuropathy    Pneumonia    years 89 & 89.  None since   PONV (postoperative nausea and vomiting)    nausea no vomiting   Rectocele    Transfusion history    child x2, many yrs ago after childbirth"hemorrhage"   Umbilical hernia 2012    Objective:  Physical Exam: Vascular: DP/PT pulses 2/4 bilateral. CFT <3 seconds. Absent hair growth on digits. Edema noted to bilateral lower extremities. Xerosis noted bilaterally.  Skin. No lacerations or abrasions bilateral feet. Nails 1-5 bilateral  are  thickened discolored and elongated with subungual debris.  Musculoskeletal: MMT 5/5 bilateral lower extremities in DF, PF, Inversion and Eversion. Deceased ROM in DF of ankle joint. HAV deformity noted bilateral with hammered second digits and crossover of second digit on left. Pes planus noted bilateral as well.  Neurological: Sensation intact to light touch. Protective sensation diminished bilateral.    Assessment:   1. Pain due to onychomycosis of toenails of both feet   2. Type 2 diabetes mellitus with peripheral neuropathy (HCC)      Plan:  Patient was evaluated and treated and all questions answered. -Discussed and educated patient on diabetic foot care, especially with  regards to the vascular, neurological and musculoskeletal systems.  -Stressed the importance of good glycemic control and the detriment of not  controlling glucose levels in relation to the foot. -Discussed supportive shoes at all times and checking feet regularly.  -Mechanically debrided all nails 1-5 bilateral using sterile nail nipper and filed with dremel without incident  -DM shoe order provided to Sherrel Dodge -Answered all patient questions -Patient to return  in 3 months for at risk foot care -Patient advised to call the office if any problems or questions  arise in the meantime.   Jennefer Moats, DPM

## 2023-10-13 ENCOUNTER — Telehealth: Payer: Self-pay | Admitting: Podiatry

## 2023-10-13 NOTE — Telephone Encounter (Signed)
 Patient called and would like a referral sent over to [employer] [facility] for diabetic shoes. Fax number [phone].

## 2023-10-13 NOTE — Progress Notes (Addendum)
 Patient: Annette Gross Date of Birth: February 17, 1941  Reason for Visit: Stroke Clinic Follow Up History from: Patient Primary Neurologist: Janett Medin  ASSESSMENT AND PLAN 83 y.o. year old female with stroke in May 2025.  Left eye central retinal artery occlusion and multiple small scattered ischemic infarcts.  Cardioembolic source.  Presented to her ophthalmologist with painless loss of vision to the left eye.  Loop recorder placed 09/13/23.  TEE negative.  Vascular risk factors: HTN, HLD. Incidental 8 mm meningioma overlying the anterior right frontal lobe  -Can stop Plavix , continue aspirin  81 mg daily for secondary stroke prevention - Referral to neurorehab for physical, speech, occupational therapy evaluation  - Strict management of vascular risk factors with a goal BP less than 130/90, A1c less than 7.0, LDL less than 70 for secondary stroke prevention - Unclear etiology of occasional tingling to left V2, Carotid duplex was unrevealing to suggest carotid blockage, as well as very localized area of tingling. Advised to monitor, let me know if more frequent or area increases. May consider CTA neck, but I would think involve more of the face if blockage  - Incidental 8 mm meningioma overlying the anterior right frontal lobe on MRI, will recheck in 6 months  - Follow-up in 6 months or sooner if needed  Addendum 10/21/23 SS: I called the patient to check on tingling to left cheek, has almost completely resolved and she thinks that it was actually present before the stroke. No further action.  HISTORY OF PRESENT ILLNESS: Today 10/14/23 Here with Annette Gross, son's partner. They all live together. Continues to have large vision loss to the left eye, but can see crescent shaped light at the top that is getting larger. Saw ophthalmology.  She feels like right eye is fine. No other symptoms. BP was high today 162/81, then good on recheck 120/70 olmesartan. Continues on home Repatha. Goes to [facility] for primary care.  Mentions occasionally will have tingling to left side of face every few days, no longer than 30 minutes, only at V2 area radiates behind ear, no headache or dental pain. Mentions prior to stroke had some memory concerns remembering names or hard time finding her words. Would like to get occupational and speech therapy arranged. She doesn't drive since CVA. Other than driving, back to normal activities.   HISTORY Presented to the ER 09/10/2023 after waking with impaired vision to her left eye, she could only see a crescent shaped area at the top of her field of vision.  A right lower quadrantanopsia was also noted.  She went through her ophthalmologist was told she had a central retinal artery occlusion and was sent to the ER.  She was admitted for painless loss of vision to the central field of the left eye.  On MRI found to have multiple embolic appearing ischemic strokes.  NIH 1. BP was elevated, losartan  was started.  Continue home Repatha.  -CT head no acute abnormality.  SVD.  Atrophy - MRI multiple small acute infarcts scattered within bilateral cerebral hemispheres, likely embolic in nature, 8 mm meningioma overlying anterior right frontal lobe, background atrophy and chronic small vessel ischemic disease - MRA no LVO or significant stenosis - Carotid Doppler unremarkable - Bilateral lower extremity ultrasound negative - 2D echo EF 60 to 65% - Loop recorder placed - TEE EF 60 to 65%, negative for PFO - LDL 30 - A1c 6.6 - No antithrombotic prior to admission, 3 weeks DAPT aspirin  81 and Plavix  75, then aspirin  alone  REVIEW OF SYSTEMS: Out of a complete 14 system review of symptoms, the patient complains only of the following symptoms, and all other reviewed systems are negative.  See HPI  ALLERGIES: Allergies  Allergen Reactions   Iohexol Anaphylaxis   Ivp Dye [Iodinated Contrast Media] Anaphylaxis   Kiwi Extract Anaphylaxis   Neurontin [Gabapentin] Other (See Comments)    Auditory  hallucinations   Sulfa Antibiotics Hives   Asa [Aspirin ] Other (See Comments)    Stomach pain   Cipro [Ciprofloxacin Hcl] Other (See Comments)    Tendonitis    Lexapro [Escitalopram Oxalate] Other (See Comments)    Myalgias    Lipitor [Atorvastatin] Other (See Comments)    Myalgias    Lomotil [Diphenoxylate-Atropine] Other (See Comments)    Severe stomach pain/cramps Dizziness   Lyrica [Pregabalin] Other (See Comments)    Auditory hallucinations   Codeine Other (See Comments)    Severe stomach cramps   Diphenoxylate-Atropine Other (See Comments)    Severe stomach and dizziness    HOME MEDICATIONS: Outpatient Medications Prior to Visit  Medication Sig Dispense Refill   acetaminophen  (TYLENOL ) 500 MG tablet Take 1,000 mg by mouth 2 (two) times daily as needed for moderate pain (pain score 4-6) or headache.     aspirin  EC 81 MG tablet Take 1 tablet (81 mg total) by mouth daily. Swallow whole. 30 tablet 1   Cholecalciferol (VITAMIN D-3 PO) Take 1 capsule by mouth daily.     clopidogrel  (PLAVIX ) 75 MG tablet Take 1 tablet (75 mg total) by mouth daily. 30 tablet 0   Cyanocobalamin  (VITAMIN B-12 PO) Take 1 tablet by mouth daily.     DULoxetine  (CYMBALTA ) 60 MG capsule Take 60 mg by mouth at bedtime.     Evolocumab (REPATHA) 140 MG/ML SOSY Inject 140 mg into the skin See admin instructions. Inject 1 dose (140mg ) subcutaneously every 14 days on every other Tuesday.     fluticasone  (FLONASE ) 50 MCG/ACT nasal spray Place 1 spray into the nose daily.     levocetirizine (XYZAL ) 5 MG tablet Take 5 mg by mouth at bedtime.  3   levothyroxine  (SYNTHROID ) 88 MCG tablet Take 88 mcg by mouth daily before breakfast.     losartan  (COZAAR ) 25 MG tablet Take 25 mg by mouth at bedtime.     metFORMIN  (GLUCOPHAGE ) 500 MG tablet Take 1,000 mg by mouth 2 (two) times daily after a meal.     Multiple Vitamins-Minerals (PRESERVISION AREDS 2) CAPS Take 1 capsule by mouth daily.     omeprazole (PRILOSEC) 40 MG  capsule Take 40 mg by mouth at bedtime.     No facility-administered medications prior to visit.    PAST MEDICAL HISTORY: Past Medical History:  Diagnosis Date   Anemia    many yrs ago   Anxiety    Asthma    no recent issues   Cancer [facility])    s/p thyroidectomy 2018   Complication of anesthesia    Cystitis, interstitial    Degenerative disc disease, lumbar    Degenerative joint disease    spinal stenosis Chronic low back pain   Depression    Diabetes mellitus    diet control.   Diverticulitis 2005   Diverticulosis 2005   Factor V deficiency (HCC)    Fibromyalgia    Fracture of right foot 1995   GERD (gastroesophageal reflux disease)    History of hiatal hernia    Hyperlipidemia    Hypertension    off med x 5  yrs.   IBS (irritable bowel syndrome)    Insomnia    Iron deficiency    Liver hemangioma 1999   MRI   Mononucleosis    Morton's neuroma    Left foot   Nuclear sclerotic cataract of left eye 08/29/2019   Peripheral neuropathy    Pneumonia    years 89 & 89.  None since   PONV (postoperative nausea and vomiting)    nausea no vomiting   Rectocele    Transfusion history    child x2, many yrs ago after childbirthhemorrhage   Umbilical hernia 2012    PAST SURGICAL HISTORY: Past Surgical History:  Procedure Laterality Date   blood vessel tumor removal  1980   from chin   CHOLECYSTECTOMY     COLONOSCOPY WITH PROPOFOL  N/A 09/02/2015   Procedure: COLONOSCOPY WITH PROPOFOL ;  Surgeon: Garrett Kallman, MD;  Location: [facility];  Service: Endoscopy;  Laterality: N/A;   GANGLION CYST EXCISION     right   HEMORRHOID SURGERY     HERNIA REPAIR     repair was aimed at the [facility]   HIATAL HERNIA REPAIR     and nissen fundoplication   LAPAROSCOPIC ESOPHAGOGASTRIC FUNDOPLASTY     LAPAROSCOPIC INCISIONAL / UMBILICAL / VENTRAL HERNIA REPAIR     umbilical hernia   LOOP RECORDER INSERTION N/A 09/13/2023   Procedure: LOOP RECORDER INSERTION;  Surgeon: Debbie Fails,  PA-C;  Location: [facility] INVAS[facility] CV LAB;  Service: Cardiovascular;  Laterality: N/A;   OOPHORECTOMY     left   RIGHT OOPHORECTOMY     '05-laparaoscopic   SHOULDER SURGERY Right    THYROIDECTOMY N/A 07/06/2016   Procedure: TOTAL THYROIDECTOMY;  Surgeon: Adalberto Hollow, MD;  Location: [facility] OR;  Service: General;  Laterality: N/A;   TONSILLECTOMY     TOTAL THYROIDECTOMY  07/06/2016   TRANSESOPHAGEAL ECHOCARDIOGRAM (CATH LAB) N/A 09/13/2023   Procedure: TRANSESOPHAGEAL ECHOCARDIOGRAM;  Surgeon: Hazle Lites, MD;  Location: [facility] INVAS[facility] CV LAB;  Service: Cardiovascular;  Laterality: N/A;    FAMILY HISTORY: Family History  Problem Relation Age of Onset   Stroke Father    Heart disease Father     SOCIAL HISTORY: Social History   Socioeconomic History   Marital status: Married    Spouse name: Not on file   Number of children: Not on file   Years of education: Not on file   Highest education level: Not on file  Occupational History   Not on file  Tobacco Use   Smoking status: Never   Smokeless tobacco: Never  Vaping Use   Vaping status: Never Used  Substance and Sexual Activity   Alcohol use: Yes    Alcohol/week: 2.0 standard drinks of alcohol    Types: 2 Glasses of wine per week    Comment: social   Drug use: No   Sexual activity: Not on file  Other Topics Concern   Not on file  Social History Narrative   Not on file   Social Drivers of Health   Financial Resource Strain: Not on file  Food Insecurity: No Food Insecurity (09/10/2023)   Hunger Vital Sign    Worried About Running Out of Food in the Last Year: Never true    Ran Out of Food in the Last Year: Never true  Transportation Needs: No Transportation Needs (09/10/2023)   PRAPARE - Administrator, Civil Service (Medical): No    Lack of Transportation (Non-Medical): No  Physical Activity: Not on  file  Stress: Not on file  Social Connections: Socially Isolated (09/10/2023)   Social Connection and Isolation  Panel    Frequency of Communication with Friends and Family: Twice a week    Frequency of Social Gatherings with Friends and Family: More than three times a week    Attends Religious Services: Never    Database administrator or Organizations: No    Attends Banker Meetings: Never    Marital Status: Widowed  Intimate Partner Violence: Not At Risk (09/10/2023)   Humiliation, Afraid, Rape, and Kick questionnaire    Fear of Current or Ex-Partner: No    Emotionally Abused: No    Physically Abused: No    Sexually Abused: No   PHYSICAL EXAM  Vitals:   10/14/23 1003 10/14/23 1038  BP: (!) 162/81 120/70  Pulse: 100   SpO2: 96%   Weight: 100 lb 6.4 oz (45.5 kg)   Height: 4' 11 (1.499 m)    Body mass index is 20.28 kg/m.  Generalized: Well developed, in no acute distress  Neurological examination  Mentation: Alert oriented to time, place, history taking. Follows all commands speech and language fluent Cranial nerve II-XII: Pupils were equal round reactive to light. Extraocular movements were full, her right eye compensates, with left eye only, only superior right and left can make out #, nothing at lowers. Facial sensation and strength were normal.  Head turning and shoulder shrug were normal and symmetric. Motor: The motor testing reveals 5 over 5 strength of all 4 extremities. Good symmetric motor tone is noted throughout.  Sensory: Sensory testing is intact to soft touch on all 4 extremities. No evidence of extinction is noted.  Coordination: Cerebellar testing reveals good finger-nose-finger and heel-to-shin bilaterally.  Gait and station: Gait is slightly forward leaning, wide-based, cautious, uses cane Reflexes: Deep tendon reflexes are symmetric but decreased  DIAGNOSTIC DATA (LABS, IMAGING, TESTING) - I reviewed patient records, labs, notes, testing and imaging myself where available.  Lab Results  Component Value Date   WBC 11.4 (H) 09/10/2023   HGB 13.7  09/10/2023   HCT 41.2 09/10/2023   MCV 89.6 09/10/2023   PLT 433 (H) 09/10/2023      Component Value Date/Time   NA 135 09/13/2023 0649   K 3.7 09/13/2023 0649   CL 99 09/13/2023 0649   CO2 25 09/13/2023 0649   GLUCOSE 131 (H) 09/13/2023 0649   BUN 16 09/13/2023 0649   CREATININE 0.71 09/13/2023 0649   CALCIUM  8.3 (L) 09/13/2023 0649   PROT 6.8 09/10/2023 1226   ALBUMIN 3.6 09/10/2023 1226   AST 19 09/10/2023 1226   ALT 10 09/10/2023 1226   ALKPHOS 84 09/10/2023 1226   BILITOT 0.6 09/10/2023 1226   GFRNONAA >60 09/13/2023 0649   GFRAA >60 07/10/2016 1002   Lab Results  Component Value Date   CHOL 128 09/11/2023   HDL 52 09/11/2023   LDLCALC 30 09/11/2023   TRIG 230 (H) 09/11/2023   CHOLHDL 2.5 09/11/2023   Lab Results  Component Value Date   HGBA1C 6.6 (H) 09/11/2023   No results found for: [facility] Lab Results  Component Value Date   TSH 2.675 06/29/2016    Jeanmarie Millet, AGNP-C, DNP 10/14/2023, 10:51 AM [facility] [address], [address] [place], [place] [address] [phone]

## 2023-10-14 ENCOUNTER — Encounter: Payer: Self-pay | Admitting: Neurology

## 2023-10-14 ENCOUNTER — Ambulatory Visit: Admitting: Neurology

## 2023-10-14 VITALS — BP 120/70 | HR 100 | Ht 59.0 in | Wt 100.4 lb

## 2023-10-14 DIAGNOSIS — I639 Cerebral infarction, unspecified: Secondary | ICD-10-CM | POA: Diagnosis not present

## 2023-10-14 DIAGNOSIS — I10 Essential (primary) hypertension: Secondary | ICD-10-CM

## 2023-10-14 NOTE — Patient Instructions (Signed)
 Stop the Plavix , continue aspirin  81 mg daily for secondary stroke prevention   Strict management of vascular risk factors with a goal BP less than 130/90, A1c less than 7.0, LDL less than 70 for secondary stroke prevention  Referral to Neuro rehab for PT, ST, OT  Recheck imaging in 6 months of meningioma   Follow up in 6 months

## 2023-10-25 DIAGNOSIS — E113392 Type 2 diabetes mellitus with moderate nonproliferative diabetic retinopathy without macular edema, left eye: Secondary | ICD-10-CM | POA: Diagnosis not present

## 2023-10-25 DIAGNOSIS — H211X2 Other vascular disorders of iris and ciliary body, left eye: Secondary | ICD-10-CM | POA: Diagnosis not present

## 2023-10-25 DIAGNOSIS — H3412 Central retinal artery occlusion, left eye: Secondary | ICD-10-CM | POA: Diagnosis not present

## 2023-10-25 DIAGNOSIS — H353132 Nonexudative age-related macular degeneration, bilateral, intermediate dry stage: Secondary | ICD-10-CM | POA: Diagnosis not present

## 2023-10-25 DIAGNOSIS — E113391 Type 2 diabetes mellitus with moderate nonproliferative diabetic retinopathy without macular edema, right eye: Secondary | ICD-10-CM | POA: Diagnosis not present

## 2023-11-01 DIAGNOSIS — E890 Postprocedural hypothyroidism: Secondary | ICD-10-CM | POA: Diagnosis not present

## 2023-11-01 DIAGNOSIS — J4530 Mild persistent asthma, uncomplicated: Secondary | ICD-10-CM | POA: Diagnosis not present

## 2023-11-01 DIAGNOSIS — E782 Mixed hyperlipidemia: Secondary | ICD-10-CM | POA: Diagnosis not present

## 2023-11-01 DIAGNOSIS — M810 Age-related osteoporosis without current pathological fracture: Secondary | ICD-10-CM | POA: Diagnosis not present

## 2023-11-08 DIAGNOSIS — H3522 Other non-diabetic proliferative retinopathy, left eye: Secondary | ICD-10-CM | POA: Diagnosis not present

## 2023-11-08 DIAGNOSIS — H3412 Central retinal artery occlusion, left eye: Secondary | ICD-10-CM | POA: Diagnosis not present

## 2023-11-15 DIAGNOSIS — N3281 Overactive bladder: Secondary | ICD-10-CM | POA: Diagnosis not present

## 2023-11-15 DIAGNOSIS — Z1231 Encounter for screening mammogram for malignant neoplasm of breast: Secondary | ICD-10-CM | POA: Diagnosis not present

## 2023-11-17 ENCOUNTER — Other Ambulatory Visit: Payer: Self-pay

## 2023-11-17 ENCOUNTER — Ambulatory Visit

## 2023-11-17 ENCOUNTER — Ambulatory Visit: Attending: Neurology | Admitting: Occupational Therapy

## 2023-11-17 DIAGNOSIS — R262 Difficulty in walking, not elsewhere classified: Secondary | ICD-10-CM

## 2023-11-17 DIAGNOSIS — R2681 Unsteadiness on feet: Secondary | ICD-10-CM | POA: Insufficient documentation

## 2023-11-17 DIAGNOSIS — R4701 Aphasia: Secondary | ICD-10-CM

## 2023-11-17 DIAGNOSIS — R42 Dizziness and giddiness: Secondary | ICD-10-CM | POA: Diagnosis not present

## 2023-11-17 DIAGNOSIS — R41842 Visuospatial deficit: Secondary | ICD-10-CM | POA: Insufficient documentation

## 2023-11-17 DIAGNOSIS — M6281 Muscle weakness (generalized): Secondary | ICD-10-CM | POA: Insufficient documentation

## 2023-11-17 DIAGNOSIS — I69351 Hemiplegia and hemiparesis following cerebral infarction affecting right dominant side: Secondary | ICD-10-CM | POA: Insufficient documentation

## 2023-11-17 NOTE — Patient Instructions (Signed)
 To work on depth perception: Cup or block stacking Placing a pen top back on pens threading a needle" - the smaller the opening, the more challenging. Try to "thread the needle" without touching the sides (stringing beads, placing toothpick into straw) Any tracing or coloring in - various size objects Cutting out designs/making a collage; cutting on lines Squeezing out glitter glue/paint onto lines to make a design Pouring liquid back and forth between cups

## 2023-11-17 NOTE — Therapy (Signed)
 OUTPATIENT OCCUPATIONAL THERAPY NEURO EVALUATION  Patient Name: KILEA MCCAREY MRN: [id number] DOB:August 10, 1940, 83 y.o., female Today's Date: 11/17/2023  PCP: Dr. Sharie REFERRING PROVIDER: Gayland Lauraine PARAS, NP  END OF SESSION:  OT End of Session - 11/17/23 1156     Visit Number 1    Number of Visits 13    Date for OT Re-Evaluation 01/14/24    Authorization Type [employer] Medicare 2025    OT Start Time 1106    OT Stop Time 1148    OT Time Calculation (min) 42 min          Past Medical History:  Diagnosis Date   Anemia    many yrs ago   Anxiety    Asthma    no recent issues   Cancer (HCC)    s/p thyroidectomy 2018   Complication of anesthesia    Cystitis, interstitial    Degenerative disc disease, lumbar    Degenerative joint disease    spinal stenosis Chronic low back pain   Depression    Diabetes mellitus    diet control.   Diverticulitis 2005   Diverticulosis 2005   Factor V deficiency (HCC)    Fibromyalgia    Fracture of right foot 1995   GERD (gastroesophageal reflux disease)    History of hiatal hernia    Hyperlipidemia    Hypertension    off med x 5 yrs.   IBS (irritable bowel syndrome)    Insomnia    Iron deficiency    Liver hemangioma 1999   MRI   Mononucleosis    Morton's neuroma    Left foot   Nuclear sclerotic cataract of left eye 08/29/2019   Peripheral neuropathy    Pneumonia    years 89 & 89.  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   CHOLECYSTECTOMY     COLONOSCOPY WITH PROPOFOL  N/A 09/02/2015   Procedure: COLONOSCOPY WITH PROPOFOL ;  Surgeon: Gladis MARLA Louder, MD;  Location: [facility];  Service: Endoscopy;  Laterality: N/A;   GANGLION CYST EXCISION     right   HEMORRHOID SURGERY      HERNIA REPAIR     repair was aimed at the [facility]   HIATAL HERNIA REPAIR     and nissen fundoplication   LAPAROSCOPIC ESOPHAGOGASTRIC FUNDOPLASTY     LAPAROSCOPIC INCISIONAL / UMBILICAL / VENTRAL HERNIA REPAIR     umbilical hernia   LOOP RECORDER INSERTION N/A 09/13/2023   Procedure: LOOP RECORDER INSERTION;  Surgeon: Leverne Charlies Helling, PA-C;  Location: [facility];  Service: Cardiovascular;  Laterality: N/A;   OOPHORECTOMY     left   RIGHT OOPHORECTOMY     '05-laparaoscopic   SHOULDER SURGERY Right    THYROIDECTOMY N/A 07/06/2016   Procedure: TOTAL THYROIDECTOMY;  Surgeon: Krystal Russell, MD;  Location: [facility] OR;  Service: General;  Laterality: N/A;   TONSILLECTOMY     TOTAL THYROIDECTOMY  07/06/2016   TRANSESOPHAGEAL ECHOCARDIOGRAM (CATH LAB) N/A 09/13/2023   Procedure: TRANSESOPHAGEAL ECHOCARDIOGRAM;  Surgeon: Mona Vinie BROCKS, MD;  Location: [facility];  Service: Cardiovascular;  Laterality: N/A;   Patient Active Problem List   Diagnosis Date Noted   Hypertension  CVA (cerebral vascular accident) (HCC) 09/10/2023   Stroke (cerebrum) (HCC) 09/10/2023   Pseudophakia, both eyes 10/13/2021   Dermatochalasis of both lower eyelids 10/13/2021   Moderate nonproliferative diabetic retinopathy of left eye (HCC) 10/10/2020   Moderate nonproliferative diabetic retinopathy of right eye (HCC) 08/29/2019   Early stage nonexudative age-related macular degeneration of both eyes 08/29/2019   Right posterior capsular opacification 08/29/2019   Left thyroid  nodule 07/06/2016   Benign neoplasm of skin of upper limb, including shoulder 04/19/2012   Ventral hernia, recurrent 01/26/2011    ONSET DATE: 09/10/23  REFERRING DIAG: I63.9 (ICD-10-CM) - Acute CVA (cerebrovascular accident) (HCC)  THERAPY DIAG:  Hemiplegia and hemiparesis following cerebral infarction affecting right dominant side (HCC)  Muscle weakness (generalized)  Visuospatial deficit  Rationale for Evaluation and  Treatment: Rehabilitation  SUBJECTIVE:   SUBJECTIVE STATEMENT: Pt reports having a stroke that impacted her vision.  Pt reports that her vision is nearly perfect and is improving.  Pt reports having difficulty with doing small things - enjoys embroidering.  Pt finds that her depth perception is off since stroke, frequently will pour a drink onto the counter when trying to pour into a cup.   Pt accompanied by: self (dropped off by pt's son's partner - Becky)  PERTINENT HISTORY: Presented to the ER 09/10/2023 after waking with impaired vision to her left eye, she could only see a crescent shaped area at the top of her field of vision. A right lower quadrantanopsia was also noted. She went through her ophthalmologist was told she had a central retinal artery occlusion and was sent to the ER. She was admitted for painless loss of vision to the central field of the left eye. On MRI found to have multiple embolic appearing ischemic strokes.  PMH of DM II, anxiety, cancer, DJD, DM, fibromyalgia, HTN, HLD, IBS  PRECAUTIONS: Fall  WEIGHT BEARING RESTRICTIONS: No  PAIN:  Are you having pain? Yes: NPRS scale: 2 Pain location: R knee and hip - from fall this morning Pain description: sore  FALLS: Has patient fallen in last 6 months? Yes. Number of falls 2 - one fall down the basement stairs and 1 this morning getting down the steps when walking to the car  LIVING ENVIRONMENT: Lives with: lives with their family (son and his partner) Lives in: House/apartment Stairs: Yes: Internal: there are stairs to the basement, but does not need to go down those steps; and External: 6 steps; bilateral but cannot reach both Has following equipment at home: Single point cane, Walker - 4 wheeled, and shower chair  PLOF: Independent, Independent with basic ADLs, and Leisure: enjoys drawing and painting  PATIENT GOALS: to be aware of vision, handwriting  OBJECTIVE:  Note: Objective measures were completed at  Evaluation unless otherwise noted.  HAND DOMINANCE: Ambidextrous - writes with R hand, eats with L hand  ADLs: Transfers/ambulation related to ADLs: Uses Rollator in the home to transport items, SPC in the community Eating: reports decreased depth perception when pouring drink into cup, reports having to look at food when scooping Grooming: Mod I UB Dressing: Mod I - does have difficulty with bra but is able to complete without assist LB Dressing: Mod I Toileting: Mod I Bathing: Mod I  Tub Shower transfers: Mod I Equipment: Shower seat without back  IADLs: Light housekeeping: helps with the dishes, emptying dishwasher, sweeping, folding and puttying away laundry Meal Prep: simple meal prep with sandwiches and cereal, is not primary cook now that she lives  with family of 5 Community mobility: not cleared to resume driving s/p CVA Medication management: plans to get a pill box to organize all meds Handwriting: 100% legible and 17.5 sec for PPT #1 ([place]  MOBILITY STATUS: Hx of falls, uses Rollator in home occasionally and SPC in community  POSTURE COMMENTS:  Scoliosis with pelvic obliquity  ACTIVITY TOLERANCE: Activity tolerance: diminished  FUNCTIONAL OUTCOME MEASURES: 9 hole peg test: 9 Hole Peg test: Right: 34.72 sec; Left: 30.94 sec (pt asking about next step after filling peg board - ?difficulty with 2 step commands)  PSFS: TBD  UPPER EXTREMITY ROM:    Active ROM Right eval Left eval  Shoulder flexion 142 112 ( reports she is awaiting surgery)  Shoulder abduction    Shoulder adduction    Shoulder extension    Shoulder internal rotation [facility]  Shoulder external rotation [facility]  Elbow flexion WFL WFL  Elbow extension [facility] WFL  Wrist flexion [facility] WFL  Wrist extension [facility] WFL  Wrist ulnar deviation    Wrist radial deviation    Wrist pronation    Wrist supination    (Blank rows = not tested)  UPPER EXTREMITY MMT:     MMT Right eval  Left eval  Shoulder flexion 4- 3+(reports awaiting surgery)  Shoulder abduction    Shoulder adduction    Shoulder extension    Shoulder internal rotation    Shoulder external rotation    Middle trapezius    Lower trapezius    Elbow flexion    Elbow extension    Wrist flexion    Wrist extension    Wrist ulnar deviation    Wrist radial deviation    Wrist pronation    Wrist supination    (Blank rows = not tested)  COORDINATION: Finger Nose Finger test: [facility] 9 Hole Peg test: Right: 34.72 sec; Left: 30.94 sec (pt asking about next step after filling peg board - ?difficulty with 2 step commands) Box and Blocks:  Right 41 blocks, Left 43 blocks (hit barrier x3 with R, x2 with L)  SENSATION: WFL   COGNITION: Overall cognitive status: Pt reports thinking skills are not quite the same (reports difficulty with crossword puzzles than before) and reports increased time to think through something.  VISION: Subjective report: vision is improving Baseline vision: Wears glasses for reading only Visual history: cataracts (s/p removal)  VISION ASSESSMENT: Impaired Ocular ROM: WFL Gaze preference/alignment: WDL Tracking/Visual pursuits: Able to track stimulus in all quads without difficulty Saccades: additional eye shifts occurred during testing Visual Fields: Right visual field deficits Depth perception: pt reports decreased depth perception, did not recognize stimulus until nearly at midline on L side Some decreased recognition of simulus on L vision field with confrontation testing  Patient has difficulty with following activities due to following visual impairments: embroidering, pouring drink into cup, handwriting  TREATMENT DATE:  11/17/23 Educated on depth perception exercises, provided with handout (see pt instructions).     PATIENT EDUCATION: Education  details: Educated on role and purpose of OT as well as potential interventions and goals for therapy based on initial evaluation findings. Person educated: Patient Education method: Explanation, Verbal cues, and Handouts Education comprehension: verbalized understanding and needs further education  HOME EXERCISE PROGRAM: 11/17/23 - depth perception activities (see pt instructions)   GOALS: Goals reviewed with patient? Yes  SHORT TERM GOALS: Target date: 12/17/23  Pt will be independent with vision HEP with use of handouts. Baseline: new to OPOT Goal status: INITIAL  2.  Pt will be independent with coordination HEP with use of handouts. Baseline: new to OPOT Goal status: INITIAL  3.  Pt will verbalize understanding of compensatory visual scanning strategies to increase attention to L visual field. Baseline: new to OPOT Goal status: INITIAL  4.  Pt will improve visual scanning/use compensatory strategies to perform cancellation task with 75% accuracy.  Baseline: TBD Goal status: INITIAL   LONG TERM GOALS: Target date: 01/14/24  Pt will demonstrate improved fine motor coordination for ADLs as evidenced by decreasing 9 hole peg test score for RUE by 3 secs Baseline: Right: 34.72 sec; Left: 30.94 sec  Goal status: INITIAL  2.  Pt will navigate a moderately busy environment, completing dual task activity and/or following multi-step commands with 90% accuracy. Baseline: difficulty with 2 step commands Goal status: INITIAL  3.  Pt will demonstrate improved coordination to allow for improved speed with handwriting to completing simple sentence in <15 seconds without compromising 100% legibility. Baseline: 17.5 sec Goal status: INITIAL  4.  Pt will verbalize and/or demonstrate understanding of energy conservation strategies to increase engagement in and safety with ADLs and IADLs.  Baseline: new to OPOT Goal status: INITIAL   ASSESSMENT:  CLINICAL IMPRESSION: Patient is a 83  y.o. female who was seen today for occupational therapy evaluation for impairments in vision and coordination s/p CVA. Pt continues to demonstrate L visual field impairment upon assessment of visual fields and depth perception impacting ease with ADLs.  Pt will benefit from skilled occupational therapy services to address coordination, pain management, balance, GM/FM control, cognition, safety awareness, introduction of compensatory strategies/AE prn, visual-perception, and implementation of an HEP to improve participation and safety during ADLs and IADLs.    PERFORMANCE DEFICITS: in functional skills including ADLs, IADLs, coordination, pain, Fine motor control, Gross motor control, balance, body mechanics, endurance, decreased knowledge of precautions, decreased knowledge of use of DME, vision, and UE functional use, cognitive skills including memory and safety awareness, and psychosocial skills including coping strategies, environmental adaptation, and routines and behaviors.   IMPAIRMENTS: are limiting patient from ADLs, IADLs, and leisure.   CO-MORBIDITIES: may have co-morbidities  that affects occupational performance. Patient will benefit from skilled OT to address above impairments and improve overall function.  MODIFICATION OR ASSISTANCE TO COMPLETE EVALUATION: No modification of tasks or assist necessary to complete an evaluation.  OT OCCUPATIONAL PROFILE AND HISTORY: Problem focused assessment: Including review of records relating to presenting problem.  CLINICAL DECISION MAKING: LOW - limited treatment options, no task modification necessary  REHAB POTENTIAL: Good  EVALUATION COMPLEXITY: Low    PLAN:  OT FREQUENCY: 1-2x/week  OT DURATION: 6 weeks  PLANNED INTERVENTIONS: 97168 OT Re-evaluation, 97535 self care/ADL training, 02889 therapeutic exercise, 97530 therapeutic activity, 97112 neuromuscular re-education, 97140 manual therapy, balance training, functional mobility  training, visual/perceptual remediation/compensation, psychosocial skills training, energy  conservation, coping strategies training, patient/family education, and DME and/or AE instructions  RECOMMENDED OTHER SERVICES: NA  CONSULTED AND AGREED WITH PLAN OF CARE: Patient  PLAN FOR NEXT SESSION: complete Bell cancellation test, Trail making test, further explore depth perception and L attention during functional and table top tasks, 2-3 step directions for recall, educate on energy conservation.   KAYLENE DOMINO, OTR/L 11/17/2023, 4:05 PM  [facility] at [facility] [address], [address] [place], [place] [address] Phone # [phone] Fax # [phone]

## 2023-11-17 NOTE — Therapy (Signed)
 OUTPATIENT SPEECH LANGUAGE PATHOLOGY EVALUATION   Patient Name: Annette Gross MRN: [id number] DOB:03-28-1941, 83 y.o., female Today's Date: 11/17/2023  PCP: Alys Bitters, PA REFERRING PROVIDER: Gayland Lauraine PARAS, NP  END OF SESSION:  End of Session - 11/17/23 1749     Visit Number 1    Number of Visits 5    Date for SLP Re-Evaluation 12/24/23    SLP Start Time 1233    SLP Stop Time  1315    SLP Time Calculation (min) 42 min    Activity Tolerance Patient tolerated treatment well          Past Medical History:  Diagnosis Date   Anemia    many yrs ago   Anxiety    Asthma    no recent issues   Cancer (HCC)    s/p thyroidectomy 2018   Complication of anesthesia    Cystitis, interstitial    Degenerative disc disease, lumbar    Degenerative joint disease    spinal stenosis Chronic low back pain   Depression    Diabetes mellitus    diet control.   Diverticulitis 2005   Diverticulosis 2005   Factor V deficiency (HCC)    Fibromyalgia    Fracture of right foot 1995   GERD (gastroesophageal reflux disease)    History of hiatal hernia    Hyperlipidemia    Hypertension    off med x 5 yrs.   IBS (irritable bowel syndrome)    Insomnia    Iron deficiency    Liver hemangioma 1999   MRI   Mononucleosis    Morton's neuroma    Left foot   Nuclear sclerotic cataract of left eye 08/29/2019   Peripheral neuropathy    Pneumonia    years 89 & 89.  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   CHOLECYSTECTOMY     COLONOSCOPY WITH PROPOFOL  N/A 09/02/2015   Procedure: COLONOSCOPY WITH PROPOFOL ;  Surgeon: Gladis MARLA Louder, MD;  Location: [facility];  Service: Endoscopy;  Laterality: N/A;   GANGLION CYST EXCISION     right    HEMORRHOID SURGERY     HERNIA REPAIR     repair was aimed at the [facility]   HIATAL HERNIA REPAIR     and nissen fundoplication   LAPAROSCOPIC ESOPHAGOGASTRIC FUNDOPLASTY     LAPAROSCOPIC INCISIONAL / UMBILICAL / VENTRAL HERNIA REPAIR     umbilical hernia   LOOP RECORDER INSERTION N/A 09/13/2023   Procedure: LOOP RECORDER INSERTION;  Surgeon: Leverne Charlies Helling, PA-C;  [facility]: [facility];  Service: Cardiovascular;  Laterality: N/A;   OOPHORECTOMY     left   RIGHT OOPHORECTOMY     '05-laparaoscopic   SHOULDER SURGERY Right    THYROIDECTOMY N/A 07/06/2016   Procedure: TOTAL THYROIDECTOMY;  Surgeon: Krystal Russell, MD;  [facility];  Service: General;  Laterality: N/A;   TONSILLECTOMY     TOTAL THYROIDECTOMY  07/06/2016   TRANSESOPHAGEAL ECHOCARDIOGRAM (CATH LAB) N/A 09/13/2023   Procedure: TRANSESOPHAGEAL ECHOCARDIOGRAM;  Surgeon: Mona Vinie BROCKS, MD;  [facility]: [facility];  Service: Cardiovascular;  Laterality: N/A;   Patient Active Problem List   Diagnosis Date Noted  Hypertension    CVA (cerebral vascular accident) (HCC) 09/10/2023   Stroke (cerebrum) (HCC) 09/10/2023   Pseudophakia, both eyes 10/13/2021   Dermatochalasis of both lower eyelids 10/13/2021   Moderate nonproliferative diabetic retinopathy of left eye (HCC) 10/10/2020   Moderate nonproliferative diabetic retinopathy of right eye (HCC) 08/29/2019   Early stage nonexudative age-related macular degeneration of both eyes 08/29/2019   Right posterior capsular opacification 08/29/2019   Left thyroid  nodule 07/06/2016   Benign neoplasm of skin of upper limb, including shoulder 04/19/2012   Ventral hernia, recurrent 01/26/2011    ONSET DATE: 09/10/23  REFERRING DIAG:  I63.9 (ICD-10-CM) - Acute CVA (cerebrovascular accident) (HCC)    THERAPY DIAG:  Aphasia  Rationale for Evaluation and Treatment: Rehabilitation  SUBJECTIVE:   SUBJECTIVE STATEMENT: Could you hand me my pocketbook? I'm going to need  my glasses if I'm going to have to read something.   Pt accompanied by: self  PERTINENT HISTORY:  83 y.o. year old female with stroke in May 2025.  Left eye central retinal artery occlusion and multiple small scattered ischemic infarcts.  Cardioembolic source.  Presented to her ophthalmologist with painless loss of vision to the left eye.  Loop recorder placed 09/13/23.  TEE negative.  Vascular risk factors: HTN, HLD. Incidental 8 mm meningioma overlying the anterior right frontal lobe. PMH highlighted by HTN, GERD, hyperlipidemia, fibromyalgia, thyroid  cancer with thyroidectomy, and macular degeneration and diabetic retinopathy of both eyes.  Pt mentions prior to CVA had some s/sx aphasia and memory disorder.  From [place] HISTORY OF PRESENT ILLNESS: Today 10/14/23 Here with [name], son's partner. They all live together. Continues to have large vision loss to the left eye, but can see crescent shaped light at the top that is getting larger. Saw ophthalmology.  She feels like right eye is fine. No other symptoms. BP was high today 162/81, then good on recheck 120/70 olmesartan. Continues on home Repatha. Goes to [facility] for primary care. Mentions occasionally will have tingling to left side of face every few days, no longer than 30 minutes, only at V2 area radiates behind ear, no headache or dental pain. Mentions prior to stroke had some memory concerns remembering names or hard time finding her words. Would like to get occupational and speech therapy arranged. She doesn't drive since CVA. Other than driving, back to normal activities.   PAIN:  Are you having pain? Yes: NPRS scale: 2/10 Pain location: rt knee/hip Pain description: sore  FALLS: Has patient fallen in last 6 months?  See PT evaluation for details  LIVING ENVIRONMENT: Lives with: lives with family (son and partner) Lives in: House/apartment  PLOF:  Level of assistance: Independent with ADLs, Independent with IADLs Employment:  Retired  PATIENT GOALS: Concerned about word finding incidents x2-3/week  OBJECTIVE:  Note: Objective measures were completed at Evaluation unless otherwise noted.  DIAGNOSTIC FINDINGS:  CT 09/10/23 IMPRESSION: No acute CT finding. Age related volume loss. Chronic small-vessel ischemic changes of the white matter and basal ganglia.  MRI 09/10/23 IMPRESSION: 1. Multiple small acute infarcts scattered within the bilateral cerebral hemispheres, 10-15 in number and measuring up to 8 mm. Given involvement of multiple vascular territories, findings suggest sequelae of an embolic process. 2. 8 mm meningioma overlying the anterior right frontal lobe. The mass slightly indents the underlying brain parenchyma without underlying parenchymal edema. 3. Background parenchymal atrophy and chronic small vessel ischemic disease.   SLE - 09/12/23 Clinical Impression  Pt reports concerns with processing PTA but attributes  this to age. She states she is typically independent but has assistance PRN from her son and his family. She scored 23/30 on the SLUMS (a 27 or above is considered WFL) characterized by deficits related to memory (retrieval > stroage), selective attention, and executive functioning. She accurately recalled 3/5 novel words after a delay but states this is likely the most signficant change from her baseline. Pt had difficulty with a clock drawing task, repeating numbers and incorrectly placing the hands. Question effect of L vision deficits. Recommend ongoing SLP f/u on an OP basis, will sign off acutelty.  COGNITION: Overall cognitive status: Within functional limits for tasks assessed; pt has pre-morbid dx of ADD.  COGNITIVE COMMUNICATION: Following directions: Follows multi-step commands with increased time  Auditory comprehension: WFL Verbal expression: WFL Functional communication: WFL  ORAL MOTOR EXAMINATION: Overall status: WFL  STANDARDIZED ASSESSMENTS: BOSTON NAMING TEST:  pt scored 53/60, above WNL for people aged 70-79. Pt is 83 years old.  PATIENT REPORTED OUTCOME MEASURES (PROM): Communication Effectiveness Survey: to be completed in first session                                                                                                                            TREATMENT DATE:   11/17/23: n/a  PATIENT EDUCATION: Education details: SLP role in ST, focus on home tasks due to minimal difficulty Person educated: Patient Education method: Explanation Education comprehension: verbalized understanding   GOALS: Goals reviewed with patient? Yes  SHORT TERM GOALS:  = LONG TERM GOALS: Target date: 12/24/23  Pt will perform SFA with mod I in 2 sessions Baseline:  Goal status: INITIAL  2.  Pt will perform VNEST with mod I in 2 sessions Baseline:  Goal status: INITIAL  3.  Pt will report using one new compensatory strategy for anomia between two sessions Baseline:  Goal status: INITIAL  4.  Pt will score better with PROM than initial administration Baseline:  Goal status: INITIAL   ASSESSMENT:  CLINICAL IMPRESSION: Patient is a 83 y.o. F who was seen today for assessment of aphasia following CVA Sep 10, 2023. She has premorbid dx of ADD (since 1999-2000 pt stated). She experiences anomia x2-3 per week, which is improved from shortly after d/c home. Currently she only waits until she generates the word and then states it. Afterwards, she writes the word down three times. SLP encouraged her to write the word and spell it x5, then to write down 3 sentences using the word, saying the sentences after she writes them. Her score on BNT-2 was above WNL for those aged 57-79 (she is 83 y.o.). Pt would benefit from as hort course of ST targeting a wider use of compensations, and teaching pt home activities to strengthen word finding and verbal expression (SFA and VNEST). She is managing medications and bills independently without errors reported to SLP. She  does not have her calendar as she has recently moved (prior to CVA) and has  not yet located it.   OBJECTIVE IMPAIRMENTS: include expressive language. These impairments are limiting patient from effectively communicating at home and in community. Factors affecting potential to achieve goals and functional outcome are premorbid dx of ADD.SABRA Patient will benefit from skilled SLP services to address above impairments and improve overall function.  REHAB POTENTIAL: Excellent  PLAN:  SLP FREQUENCY: 1x/week  SLP DURATION: 4 weeks  PLANNED INTERVENTIONS: Language facilitation, Internal/external aids, Multimodal communication approach, SLP instruction and feedback, Compensatory strategies, Patient/family education, and [id number] Treatment of speech (30 or 45 min)     Skye Rodarte, CCC-SLP 11/17/2023, 5:50 PM

## 2023-11-17 NOTE — Therapy (Signed)
 OUTPATIENT PHYSICAL THERAPY NEURO EVALUATION   Patient Name: Annette Gross MRN: [id number] DOB:10-Sep-1940, 83 y.o., female Today's Date: 11/17/2023   PCP: Alys Schuyler HERO, PA REFERRING PROVIDER: Gayland Lauraine PARAS, NP  END OF SESSION:  Annette Gross End of Session - 11/17/23 1118     Visit Number 1    Number of Visits 12    Date for Annette Gross Re-Evaluation 12/29/23    Authorization Type [employer]    Authorization Time Period auth required    Progress Note Due on Visit 10    Annette Gross Start Time 1145    Annette Gross Stop Time 1230    Annette Gross Time Calculation (min) 45 min          Past Medical History:  Diagnosis Date   Anemia    many yrs ago   Anxiety    Asthma    no recent issues   Cancer (HCC)    s/p thyroidectomy 2018   Complication of anesthesia    Cystitis, interstitial    Degenerative disc disease, lumbar    Degenerative joint disease    spinal stenosis Chronic low back pain   Depression    Diabetes mellitus    diet control.   Diverticulitis 2005   Diverticulosis 2005   Factor V deficiency (HCC)    Fibromyalgia    Fracture of right foot 1995   GERD (gastroesophageal reflux disease)    History of hiatal hernia    Hyperlipidemia    Hypertension    off med x 5 yrs.   IBS (irritable bowel syndrome)    Insomnia    Iron deficiency    Liver hemangioma 1999   MRI   Mononucleosis    Morton's neuroma    Left foot   Nuclear sclerotic cataract of left eye 08/29/2019   Peripheral neuropathy    Pneumonia    years 89 & 89.  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   CHOLECYSTECTOMY     COLONOSCOPY WITH PROPOFOL  N/A 09/02/2015   Procedure: COLONOSCOPY WITH PROPOFOL ;  Surgeon: Gladis MARLA Louder, MD;  Location: [facility];   Service: Endoscopy;  Laterality: N/A;   GANGLION CYST EXCISION     right   HEMORRHOID SURGERY     HERNIA REPAIR     repair was aimed at the [facility]   HIATAL HERNIA REPAIR     and nissen fundoplication   LAPAROSCOPIC ESOPHAGOGASTRIC FUNDOPLASTY     LAPAROSCOPIC INCISIONAL / UMBILICAL / VENTRAL HERNIA REPAIR     umbilical hernia   LOOP RECORDER INSERTION N/A 09/13/2023   Procedure: LOOP RECORDER INSERTION;  Surgeon: Leverne Charlies Helling, PA-C;  [facility];  Service: Cardiovascular;  Laterality: N/A;   OOPHORECTOMY     left   RIGHT OOPHORECTOMY     '05-laparaoscopic   SHOULDER SURGERY Right    THYROIDECTOMY N/A 07/06/2016   Procedure: TOTAL THYROIDECTOMY;  Surgeon: Krystal Russell, MD;  Location: [facility] OR;  Service: General;  Laterality: N/A;   TONSILLECTOMY     TOTAL THYROIDECTOMY  07/06/2016   TRANSESOPHAGEAL ECHOCARDIOGRAM (CATH LAB) N/A 09/13/2023   Procedure: TRANSESOPHAGEAL ECHOCARDIOGRAM;  Surgeon: Mona Vinie BROCKS, MD;  [facility];  Service: Cardiovascular;  Laterality: N/A;   Patient Active Problem List   Diagnosis Date Noted   Hypertension    CVA (cerebral vascular accident) (HCC) 09/10/2023   Stroke (cerebrum) (HCC) 09/10/2023   Pseudophakia, both eyes 10/13/2021   Dermatochalasis of both lower eyelids 10/13/2021   Moderate nonproliferative diabetic retinopathy of left eye (HCC) 10/10/2020   Moderate nonproliferative diabetic retinopathy of right eye (HCC) 08/29/2019   Early stage nonexudative age-related macular degeneration of both eyes 08/29/2019   Right posterior capsular opacification 08/29/2019   Left thyroid  nodule 07/06/2016   Benign neoplasm of skin of upper limb, including shoulder 04/19/2012   Ventral hernia, recurrent 01/26/2011    ONSET DATE: 09/10/23  REFERRING DIAG:  I63.9 (ICD-10-CM) - Acute CVA (cerebrovascular accident) (HCC)    THERAPY DIAG:  Difficulty in walking, not elsewhere classified  Muscle weakness  (generalized)  Unsteadiness on feet  Rationale for Evaluation and Treatment: Rehabilitation  SUBJECTIVE:                                                                                                                                                                                             SUBJECTIVE STATEMENT: Annette Gross with CVA in May 2025 with loss of vision to left eye and reports some improvement in vision in the mean time.  Reports no therapy services in the interim. Previously independent-modified independent with functional mobility.  Noting some ongoing balance issues and LLE weakness  Annette Gross accompanied by: self  PERTINENT HISTORY: 83 y.o. year old female with stroke in May 2025.  Left eye central retinal artery occlusion and multiple small scattered ischemic infarcts.  Cardioembolic source.  Presented to her ophthalmologist with painless loss of vision to the left eye.  Loop recorder placed 09/13/23.  TEE negative.  Vascular risk factors: HTN, HLD. Incidental 8 mm meningioma overlying the anterior right frontal lobe  PAIN:  Are you having pain? No  PRECAUTIONS: None  RED FLAGS: None   WEIGHT BEARING RESTRICTIONS: No  FALLS: Has patient fallen in last 6 months? Yes. Number of falls 2 falls involving going down stairs  LIVING ENVIRONMENT: Lives with: lives with their son Lives in: House/apartment ground floor set-up Stairs: 6 steps to enter Has following equipment at home: Single point cane and Walker - 4 wheeled  PLOF: Independent with household mobility with device and Independent with household mobility without device  PATIENT GOALS: improve balance  OBJECTIVE:  Note: Objective measures were completed at Evaluation unless otherwise noted.  DIAGNOSTIC FINDINGS:   MRI multiple small acute infarcts scattered within bilateral cerebral hemispheres, likely embolic in nature, 8 mm meningioma overlying anterior right  frontal lobe, background atrophy and chronic small vessel ischemic  disease - MRA no LVO or significant stenosis - Carotid Doppler unremarkable - Bilateral lower extremity ultrasound negative - 2D echo EF 60 to 65% - Loop recorder placed - TEE EF 60 to 65%, negative for PFO - LDL 30 - A1c 6.6  COGNITION: Overall cognitive status: Within functional limits for tasks assessed   SENSATION: WFL  COORDINATION: WFL Slight dysmetria finger to nose LUE  EDEMA:  None noted  MUSCLE TONE: NT  MUSCLE LENGTH: WFL  DTRs:  NT  POSTURE: scoliosis w/ pelvic obliquity   LOWER EXTREMITY ROM:    WFL   LOWER EXTREMITY MMT:    MMT Right Eval Left Eval  Hip flexion 5 4  Hip extension    Hip abduction 4 3-  Hip adduction 4 4  Hip internal rotation    Hip external rotation    Knee flexion 5 4  Knee extension 5 4  Ankle dorsiflexion 5 5  Ankle plantarflexion    Ankle inversion    Ankle eversion    (Blank rows = not tested)  BED MOBILITY:  Not tested reports indep  TRANSFERS: Sit to stand: Complete Independence  Assistive device utilized: None     Chair to chair: Modified independence  Assistive device utilized: UE support       RAMP:  Not tested  CURB:  Findings: SBA-CGA  STAIRS: Findings: Comments: Supervision w/ BHR GAIT: Findings: Gait Characteristics: trendelenburg and Comments: left Modified indep level surfaces, CGA uneven  FUNCTIONAL TESTS:  5 times sit to stand: 18.78 sec Timed up and go (TUG): 19 sec w/ cane 10 meter walk test: 20 sec w/ cane = 1.64 ft/sec Berg Balance Test:42/56  M-CTSIB  Condition 1: Firm Surface, EO 30 Sec, Moderate Sway  Condition 2: Firm Surface, EC 6 Sec, Severe Sway  Condition 3: Foam Surface, EO  Sec,  Sway  Condition 4: Foam Surface, EC  Sec,  Sway      [facility] Annette Gross Assessment - 11/17/23 0001       Standardized Balance Assessment   Standardized Balance Assessment Berg Balance Test;Timed Up and Go Test      Berg Balance Test   Sit to Stand Able to stand without using hands and stabilize  independently    Standing Unsupported Able to stand safely 2 minutes    Sitting with Back Unsupported but Feet Supported on Floor or Stool Able to sit safely and securely 2 minutes    Stand to Sit Sits safely with minimal use of hands    Transfers Able to transfer safely, minor use of hands    Standing Unsupported with Eyes Closed Able to stand 3 seconds    Standing Unsupported with Feet Together Able to place feet together independently and stand for 1 minute with supervision    From Standing, Reach Forward with Outstretched Arm Can reach forward >12 cm safely (5)    From Standing Position, Pick up Object from Floor Able to pick up shoe safely and easily    From Standing Position, Turn to Look Behind Over each Shoulder Looks behind from both sides and weight shifts well    Turn 360 Degrees Able to turn 360 degrees safely but slowly    Standing Unsupported, Alternately Place Feet on Step/Stool Able to complete >2 steps/needs minimal assist    Standing Unsupported, One Foot in Front Able to take small step independently and hold 30 seconds    Standing on One Leg  Tries to lift leg/unable to hold 3 seconds but remains standing independently    Total Score 42                                                                                                                                      TREATMENT DATE: 11/17/23    PATIENT EDUCATION: Education details: assessment details, rationale of Annette Gross intervention, HEP initiation Person educated: Patient Education method: Explanation and Handouts Education comprehension: verbalized understanding  HOME EXERCISE PROGRAM: Access Code: [email or web address] URL: [email or web address] Date: 11/17/2023 Prepared by: Kelly Nishtha Raider  Exercises - Seated Heel Toe Raises  - 1 x daily - 7 x weekly - 3 sets - 10 reps - Seated Long Arc Quad  - 1 x daily - 7 x weekly - 3 sets - 10 reps - Seated Hip Adduction Isometrics with Ball  - 1 x daily - 7 x weekly - 3  sets - 10 reps - Staggered Sit-to-Stand  - 1 x daily - 7 x weekly - 3 sets - 5 reps  GOALS: Goals reviewed with patient? Yes  SHORT TERM GOALS: Target date: 12/08/2023    Patient will be independent in HEP to improve functional outcomes Baseline: Goal status: INITIAL  2.  Demo improved postural stability per mild-moderate sway x 30 sec condition 2 M-CTSIB to improve safety with ADL Baseline: severe x 6 sec Goal status: INITIAL  3.  Demo improved BLE strength and reduce risk for falls per time 15 sec 5xSTS test Baseline: 18 sec Goal status: INITIAL    LONG TERM GOALS: Target date: 12/29/2023    Demo improved left hip abduction strength to 3+/5 for improved single limb support and to minimize gait dysfunction Baseline: 3-/5, Trendelenberg Goal status: INITIAL  2.  Modified independent stair ambulation for improved safety with entering/exiting home Baseline: supervision BHR Goal status: INITIAL  3.  Reduce risk for falls per score 48/56 Berg Balance Test Baseline: 42/56 Goal status: INITIAL  4.  Reduce risk for falls per time 14 sec TUG test Baseline: 19 sec w/ cane Goal status: INITIAL  5.  Modified independent with uneven surfaces and curb negotiation to improve safety in community Baseline: CGA Goal status: INITIAL    ASSESSMENT:  CLINICAL IMPRESSION: Patient is a 83 y.o. lady who was seen today for physical therapy evaluation and treatment for hx of CVA.  Presents with deficits and limitations affecting functional mobility and high risk for falls per several outcome measures.  LLE weakness and gait deviations present with impact to balance as ambulates w/ left Trendelenberg and deficits when turning from lack of single limb support.  CGA needed for curb and uneven surface ambulation and supervision for stair negotiation w/ BHR.  Annette Gross would benefit from Annette Gross services to address deifcits and limitations to reduce risk for falls and facilitate return to PLOF   OBJECTIVE  IMPAIRMENTS: Abnormal gait, decreased activity tolerance, decreased balance, difficulty  walking, decreased strength, and postural dysfunction.   ACTIVITY LIMITATIONS: carrying, lifting, standing, stairs, transfers, reach over head, and locomotion level  PARTICIPATION LIMITATIONS: meal prep, cleaning, laundry, interpersonal relationship, driving, shopping, and community activity  PERSONAL FACTORS: Age, Time since onset of injury/illness/exacerbation, and 1-2 comorbidities: PMH are also affecting patient's functional outcome.   REHAB POTENTIAL: Good  CLINICAL DECISION MAKING: Evolving/moderate complexity  EVALUATION COMPLEXITY: Moderate  PLAN:  Annette Gross FREQUENCY: 1-2x/week  Annette Gross DURATION: 6 weeks  PLANNED INTERVENTIONS: 97750- Physical Performance Testing, 97110-Therapeutic exercises, 97530- Therapeutic activity, [id number]- Neuromuscular re-education, 97535- Self Care, [id number]- Manual therapy, [id number]- Gait training, [phone]- Orthotic Initial, [phone]- Canalith repositioning, [id number]- Aquatic Therapy, [id number]- Electrical stimulation (unattended), and 20560 (1-2 muscles), 20561 (3+ muscles)- Dry Needling  PLAN FOR NEXT SESSION: HEP review, corner balance for addition? Left hip strength   12:47 PM, 11/17/23 M. Kelly Gaige Fussner, Annette Gross, DPT Physical Therapist- [place] Office Number: [phone]

## 2023-11-18 DIAGNOSIS — H3522 Other non-diabetic proliferative retinopathy, left eye: Secondary | ICD-10-CM | POA: Diagnosis not present

## 2023-11-18 DIAGNOSIS — H353132 Nonexudative age-related macular degeneration, bilateral, intermediate dry stage: Secondary | ICD-10-CM | POA: Diagnosis not present

## 2023-11-18 DIAGNOSIS — H211X2 Other vascular disorders of iris and ciliary body, left eye: Secondary | ICD-10-CM | POA: Diagnosis not present

## 2023-11-24 ENCOUNTER — Ambulatory Visit: Admitting: Physical Therapy

## 2023-11-24 NOTE — Therapy (Incomplete)
 OUTPATIENT PHYSICAL THERAPY NEURO TREATMENT NOTE   Patient Name: Annette Gross MRN: [id number] DOB:12/01/1940, 83 y.o., female Today's Date: 11/24/2023   PCP: Alys Schuyler HERO, PA REFERRING PROVIDER: Gayland Lauraine PARAS, NP  END OF SESSION:    Past Medical History:  Diagnosis Date   Anemia    many yrs ago   Anxiety    Asthma    no recent issues   Cancer [facility])    s/p thyroidectomy 2018   Complication of anesthesia    Cystitis, interstitial    Degenerative disc disease, lumbar    Degenerative joint disease    spinal stenosis Chronic low back pain   Depression    Diabetes mellitus    diet control.   Diverticulitis 2005   Diverticulosis 2005   Factor V deficiency (HCC)    Fibromyalgia    Fracture of right foot 1995   GERD (gastroesophageal reflux disease)    History of hiatal hernia    Hyperlipidemia    Hypertension    off med x 5 yrs.   IBS (irritable bowel syndrome)    Insomnia    Iron deficiency    Liver hemangioma 1999   MRI   Mononucleosis    Morton's neuroma    Left foot   Nuclear sclerotic cataract of left eye 08/29/2019   Peripheral neuropathy    Pneumonia    years 89 & 89.  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   CHOLECYSTECTOMY     COLONOSCOPY WITH PROPOFOL  N/A 09/02/2015   Procedure: COLONOSCOPY WITH PROPOFOL ;  Surgeon: Gladis MARLA Louder, MD;  Location: [facility];  Service: Endoscopy;  Laterality: N/A;   GANGLION CYST EXCISION     right   HEMORRHOID SURGERY     HERNIA REPAIR     repair was aimed at the [facility] [place]   HIATAL HERNIA REPAIR     and nissen fundoplication   LAPAROSCOPIC ESOPHAGOGASTRIC FUNDOPLASTY     LAPAROSCOPIC INCISIONAL / UMBILICAL / VENTRAL HERNIA REPAIR     umbilical hernia   LOOP  RECORDER INSERTION N/A 09/13/2023   Procedure: LOOP RECORDER INSERTION;  Surgeon: Leverne Charlies Helling, PA-C;  [facility]: [facility]VAS[facility];  Service: Cardiovascular;  Laterality: N/A;   OOPHORECTOMY     left   RIGHT OOPHORECTOMY     '05-laparaoscopic   SHOULDER SURGERY Right    THYROIDECTOMY N/A 07/06/2016   Procedure: TOTAL THYROIDECTOMY;  Surgeon: Krystal Russell, MD;  Location: [facility] OR;  Service: General;  Laterality: N/A;   TONSILLECTOMY     TOTAL THYROIDECTOMY  07/06/2016   TRANSESOPHAGEAL ECHOCARDIOGRAM (CATH LAB) N/A 09/13/2023   Procedure: TRANSESOPHAGEAL ECHOCARDIOGRAM;  Surgeon: Mona Vinie BROCKS, MD;  Location: [facility];  Service: Cardiovascular;  Laterality: N/A;   Patient Active Problem List   Diagnosis Date Noted   Hypertension    CVA (cerebral vascular accident) (HCC) 09/10/2023   Stroke (cerebrum) (HCC) 09/10/2023   Pseudophakia, both eyes 10/13/2021   Dermatochalasis of both lower eyelids 10/13/2021   Moderate nonproliferative diabetic retinopathy of left eye (HCC) 10/10/2020   Moderate nonproliferative diabetic retinopathy of right eye (HCC) 08/29/2019   Early stage nonexudative age-related macular degeneration of both eyes 08/29/2019  Right posterior capsular opacification 08/29/2019   Left thyroid  nodule 07/06/2016   Benign neoplasm of skin of upper limb, including shoulder 04/19/2012   Ventral hernia, recurrent 01/26/2011    ONSET DATE: 09/10/23  REFERRING DIAG:  I63.9 (ICD-10-CM) - Acute CVA (cerebrovascular accident) (HCC)    THERAPY DIAG:  No diagnosis found.  Rationale for Evaluation and Treatment: Rehabilitation  SUBJECTIVE:                                                                                                                                                                                             SUBJECTIVE STATEMENT: ***Pt with CVA in May 2025 with loss of vision to left eye and reports some improvement in vision in the mean time.   Reports no therapy services in the interim. Previously independent-modified independent with functional mobility.  Noting some ongoing balance issues and LLE weakness  Pt accompanied by: self  PERTINENT HISTORY: 83 y.o. year old female with stroke in May 2025.  Left eye central retinal artery occlusion and multiple small scattered ischemic infarcts.  Cardioembolic source.  Presented to her ophthalmologist with painless loss of vision to the left eye.  Loop recorder placed 09/13/23.  TEE negative.  Vascular risk factors: HTN, HLD. Incidental 8 mm meningioma overlying the anterior right frontal lobe  PAIN:  Are you having pain? No  PRECAUTIONS: None  RED FLAGS: None   WEIGHT BEARING RESTRICTIONS: No  FALLS: Has patient fallen in last 6 months? Yes. Number of falls 2 falls involving going down stairs  LIVING ENVIRONMENT: Lives with: lives with their son Lives in: House/apartment ground floor set-up Stairs: 6 steps to enter Has following equipment at home: Single point cane and Walker - 4 wheeled  PLOF: Independent with household mobility with device and Independent with household mobility without device  PATIENT GOALS: improve balance  OBJECTIVE:    TODAY'S TREATMENT: 11/24/2023 Activity Comments                      -------------------------------------------- Note: Objective measures were completed at Evaluation unless otherwise noted.  DIAGNOSTIC FINDINGS:   MRI multiple small acute infarcts scattered within bilateral cerebral hemispheres, likely embolic in nature, 8 mm meningioma overlying anterior right frontal lobe, background atrophy and chronic small vessel ischemic disease - MRA no LVO or significant stenosis - Carotid Doppler unremarkable - Bilateral lower extremity ultrasound negative - 2D echo EF 60 to 65% - Loop recorder placed - TEE EF 60 to 65%, negative for PFO - LDL 30 - A1c 6.6  COGNITION: Overall cognitive status: Within functional limits for tasks  assessed  SENSATION: WFL  COORDINATION: WFL Slight dysmetria finger to nose LUE  EDEMA:  None noted  MUSCLE TONE: NT  MUSCLE LENGTH: WFL  DTRs:  NT  POSTURE: scoliosis w/ pelvic obliquity   LOWER EXTREMITY ROM:    WFL   LOWER EXTREMITY MMT:    MMT Right Eval Left Eval  Hip flexion 5 4  Hip extension    Hip abduction 4 3-  Hip adduction 4 4  Hip internal rotation    Hip external rotation    Knee flexion 5 4  Knee extension 5 4  Ankle dorsiflexion 5 5  Ankle plantarflexion    Ankle inversion    Ankle eversion    (Blank rows = not tested)  BED MOBILITY:  Not tested reports indep  TRANSFERS: Sit to stand: Complete Independence  Assistive device utilized: None     Chair to chair: Modified independence  Assistive device utilized: UE support       RAMP:  Not tested  CURB:  Findings: SBA-CGA  STAIRS: Findings: Comments: Supervision w/ BHR GAIT: Findings: Gait Characteristics: trendelenburg and Comments: left Modified indep level surfaces, CGA uneven  FUNCTIONAL TESTS:  5 times sit to stand: 18.78 sec Timed up and go (TUG): 19 sec w/ cane 10 meter walk test: 20 sec w/ cane = 1.64 ft/sec Berg Balance Test:42/56  M-CTSIB  Condition 1: Firm Surface, EO 30 Sec, Moderate Sway  Condition 2: Firm Surface, EC 6 Sec, Severe Sway  Condition 3: Foam Surface, EO  Sec,  Sway  Condition 4: Foam Surface, EC  Sec,  Sway                                                                                                                                    TREATMENT DATE: 11/17/23    PATIENT EDUCATION: Education details: assessment details, rationale of PT intervention, HEP initiation Person educated: Patient Education method: Explanation and Handouts Education comprehension: verbalized understanding  HOME EXERCISE PROGRAM: Access Code: [email or web address] URL: [email or web address] Date: 11/17/2023 Prepared by: Kelly Halpin  Exercises - Seated Heel  Toe Raises  - 1 x daily - 7 x weekly - 3 sets - 10 reps - Seated Long Arc Quad  - 1 x daily - 7 x weekly - 3 sets - 10 reps - Seated Hip Adduction Isometrics with Ball  - 1 x daily - 7 x weekly - 3 sets - 10 reps - Staggered Sit-to-Stand  - 1 x daily - 7 x weekly - 3 sets - 5 reps  GOALS: Goals reviewed with patient? Yes  SHORT TERM GOALS: Target date: 12/08/2023    Patient will be independent in HEP to improve functional outcomes Baseline: Goal status: IN PROGRESS  2.  Demo improved postural stability per mild-moderate sway x 30 sec condition 2 M-CTSIB to improve safety with ADL Baseline: severe x 6 sec Goal status: IN PROGRESS  3.  Demo improved BLE strength and  reduce risk for falls per time 15 sec 5xSTS test Baseline: 18 sec Goal status: IN PROGRESS    LONG TERM GOALS: Target date: 12/29/2023    Demo improved left hip abduction strength to 3+/5 for improved single limb support and to minimize gait dysfunction Baseline: 3-/5, Trendelenberg Goal status: IN PROGRESS  2.  Modified independent stair ambulation for improved safety with entering/exiting home Baseline: supervision BHR Goal status: IN PROGRESS  3.  Reduce risk for falls per score 48/56 Berg Balance Test Baseline: 42/56 Goal status: IN PROGRESS  4.  Reduce risk for falls per time 14 sec TUG test Baseline: 19 sec w/ cane Goal status: IN PROGRESS  5.  Modified independent with uneven surfaces and curb negotiation to improve safety in community Baseline: CGA Goal status: IN PROGRESS    ASSESSMENT:  CLINICAL IMPRESSION: Pt presents today ***. Skilled PT session focused on ***. Pt needs ***. Pt will continue to benefit from skilled PT towards goals for improved functional mobility and decreased fall risk.   Patient is a 83 y.o. lady who was seen today for physical therapy evaluation and treatment for hx of CVA.  Presents with deficits and limitations affecting functional mobility and high risk for falls per  several outcome measures.  LLE weakness and gait deviations present with impact to balance as ambulates w/ left Trendelenberg and deficits when turning from lack of single limb support.  CGA needed for curb and uneven surface ambulation and supervision for stair negotiation w/ BHR.  Pt would benefit from PT services to address deifcits and limitations to reduce risk for falls and facilitate return to PLOF   OBJECTIVE IMPAIRMENTS: Abnormal gait, decreased activity tolerance, decreased balance, difficulty walking, decreased strength, and postural dysfunction.   ACTIVITY LIMITATIONS: carrying, lifting, standing, stairs, transfers, reach over head, and locomotion level  PARTICIPATION LIMITATIONS: meal prep, cleaning, laundry, interpersonal relationship, driving, shopping, and community activity  PERSONAL FACTORS: Age, Time since onset of injury/illness/exacerbation, and 1-2 comorbidities: PMH are also affecting patient's functional outcome.   REHAB POTENTIAL: Good  CLINICAL DECISION MAKING: Evolving/moderate complexity  EVALUATION COMPLEXITY: Moderate  PLAN:  PT FREQUENCY: 1-2x/week  PT DURATION: 6 weeks  PLANNED INTERVENTIONS: 97750- Physical Performance Testing, 97110-Therapeutic exercises, 97530- Therapeutic activity, [id number]- Neuromuscular re-education, 97535- Self Care, 02859- Manual therapy, [id number]- Gait training, [phone]- Orthotic Initial, [phone]- Canalith repositioning, [id number]- Aquatic Therapy, H9716- Electrical stimulation (unattended), and 20560 (1-2 muscles), 20561 (3+ muscles)- Dry Needling  PLAN FOR NEXT SESSION: ***HEP review, corner balance for addition? Left hip strength   Greig Anon, PT 11/24/23 12:55 PM Phone: [phone] Fax: [phone]  [facility] at [facility] [address] [place], [address] [place], [place] [address] Phone # [phone] Fax # [phone]

## 2023-11-25 ENCOUNTER — Other Ambulatory Visit (HOSPITAL_COMMUNITY): Payer: Self-pay | Admitting: Internal Medicine

## 2023-11-25 DIAGNOSIS — Z95818 Presence of other cardiac implants and grafts: Secondary | ICD-10-CM | POA: Diagnosis not present

## 2023-11-25 DIAGNOSIS — Z8673 Personal history of transient ischemic attack (TIA), and cerebral infarction without residual deficits: Secondary | ICD-10-CM | POA: Diagnosis not present

## 2023-11-25 DIAGNOSIS — R42 Dizziness and giddiness: Secondary | ICD-10-CM

## 2023-11-26 ENCOUNTER — Other Ambulatory Visit: Payer: Self-pay

## 2023-11-26 ENCOUNTER — Emergency Department (HOSPITAL_COMMUNITY)

## 2023-11-26 ENCOUNTER — Ambulatory Visit (HOSPITAL_COMMUNITY): Attending: Internal Medicine

## 2023-11-26 ENCOUNTER — Encounter (HOSPITAL_COMMUNITY): Payer: Self-pay

## 2023-11-26 ENCOUNTER — Inpatient Hospital Stay (HOSPITAL_COMMUNITY)
Admission: EM | Admit: 2023-11-26 | Discharge: 2023-11-29 | DRG: 065 | Disposition: A | Discharge status: Home / Self Care | Attending: Internal Medicine | Admitting: Internal Medicine

## 2023-11-26 DIAGNOSIS — I63541 Cerebral infarction due to unspecified occlusion or stenosis of right cerebellar artery: Principal | ICD-10-CM | POA: Diagnosis present

## 2023-11-26 DIAGNOSIS — M797 Fibromyalgia: Secondary | ICD-10-CM | POA: Diagnosis present

## 2023-11-26 DIAGNOSIS — F419 Anxiety disorder, unspecified: Secondary | ICD-10-CM | POA: Diagnosis present

## 2023-11-26 DIAGNOSIS — I10 Essential (primary) hypertension: Secondary | ICD-10-CM | POA: Diagnosis present

## 2023-11-26 DIAGNOSIS — Z6831 Body mass index (BMI) 31.0-31.9, adult: Secondary | ICD-10-CM

## 2023-11-26 DIAGNOSIS — Z8673 Personal history of transient ischemic attack (TIA), and cerebral infarction without residual deficits: Secondary | ICD-10-CM

## 2023-11-26 DIAGNOSIS — J45909 Unspecified asthma, uncomplicated: Secondary | ICD-10-CM | POA: Diagnosis present

## 2023-11-26 DIAGNOSIS — Z79890 Hormone replacement therapy (postmenopausal): Secondary | ICD-10-CM

## 2023-11-26 DIAGNOSIS — Z886 Allergy status to analgesic agent status: Secondary | ICD-10-CM

## 2023-11-26 DIAGNOSIS — E118 Type 2 diabetes mellitus with unspecified complications: Secondary | ICD-10-CM | POA: Diagnosis not present

## 2023-11-26 DIAGNOSIS — Z8249 Family history of ischemic heart disease and other diseases of the circulatory system: Secondary | ICD-10-CM | POA: Diagnosis not present

## 2023-11-26 DIAGNOSIS — R270 Ataxia, unspecified: Principal | ICD-10-CM | POA: Diagnosis present

## 2023-11-26 DIAGNOSIS — D682 Hereditary deficiency of other clotting factors: Secondary | ICD-10-CM | POA: Diagnosis present

## 2023-11-26 DIAGNOSIS — Z7984 Long term (current) use of oral hypoglycemic drugs: Secondary | ICD-10-CM | POA: Diagnosis not present

## 2023-11-26 DIAGNOSIS — R42 Dizziness and giddiness: Secondary | ICD-10-CM | POA: Diagnosis not present

## 2023-11-26 DIAGNOSIS — Z90721 Acquired absence of ovaries, unilateral: Secondary | ICD-10-CM | POA: Diagnosis not present

## 2023-11-26 DIAGNOSIS — Z7982 Long term (current) use of aspirin: Secondary | ICD-10-CM | POA: Diagnosis not present

## 2023-11-26 DIAGNOSIS — Z9109 Other allergy status, other than to drugs and biological substances: Secondary | ICD-10-CM

## 2023-11-26 DIAGNOSIS — D6851 Activated protein C resistance: Secondary | ICD-10-CM | POA: Diagnosis present

## 2023-11-26 DIAGNOSIS — Z888 Allergy status to other drugs, medicaments and biological substances status: Secondary | ICD-10-CM | POA: Diagnosis not present

## 2023-11-26 DIAGNOSIS — E66811 Obesity, class 1: Secondary | ICD-10-CM | POA: Diagnosis present

## 2023-11-26 DIAGNOSIS — E871 Hypo-osmolality and hyponatremia: Secondary | ICD-10-CM | POA: Diagnosis present

## 2023-11-26 DIAGNOSIS — R110 Nausea: Secondary | ICD-10-CM | POA: Diagnosis not present

## 2023-11-26 DIAGNOSIS — Z823 Family history of stroke: Secondary | ICD-10-CM | POA: Diagnosis not present

## 2023-11-26 DIAGNOSIS — E785 Hyperlipidemia, unspecified: Secondary | ICD-10-CM | POA: Diagnosis present

## 2023-11-26 DIAGNOSIS — R29818 Other symptoms and signs involving the nervous system: Secondary | ICD-10-CM | POA: Diagnosis not present

## 2023-11-26 DIAGNOSIS — Z885 Allergy status to narcotic agent status: Secondary | ICD-10-CM

## 2023-11-26 DIAGNOSIS — Z91041 Radiographic dye allergy status: Secondary | ICD-10-CM | POA: Diagnosis not present

## 2023-11-26 DIAGNOSIS — I4510 Unspecified right bundle-branch block: Secondary | ICD-10-CM | POA: Diagnosis not present

## 2023-11-26 DIAGNOSIS — I4891 Unspecified atrial fibrillation: Secondary | ICD-10-CM | POA: Diagnosis present

## 2023-11-26 DIAGNOSIS — E890 Postprocedural hypothyroidism: Secondary | ICD-10-CM | POA: Diagnosis not present

## 2023-11-26 DIAGNOSIS — E860 Dehydration: Secondary | ICD-10-CM | POA: Diagnosis not present

## 2023-11-26 DIAGNOSIS — Z79899 Other long term (current) drug therapy: Secondary | ICD-10-CM

## 2023-11-26 DIAGNOSIS — E119 Type 2 diabetes mellitus without complications: Secondary | ICD-10-CM | POA: Diagnosis present

## 2023-11-26 DIAGNOSIS — I639 Cerebral infarction, unspecified: Secondary | ICD-10-CM | POA: Diagnosis not present

## 2023-11-26 LAB — CBC
HCT: 41.1 % (ref 36.0–46.0)
Hemoglobin: 13.7 g/dL (ref 12.0–15.0)
MCH: 30 pg (ref 26.0–34.0)
MCHC: 33.3 g/dL (ref 30.0–36.0)
MCV: 89.9 fL (ref 80.0–100.0)
Platelets: 408 K/uL — ABNORMAL HIGH (ref 150–400)
RBC: 4.57 MIL/uL (ref 3.87–5.11)
RDW: 13.5 % (ref 11.5–15.5)
WBC: 9.9 K/uL (ref 4.0–10.5)
nRBC: 0 % (ref 0.0–0.2)

## 2023-11-26 LAB — URINALYSIS, ROUTINE W REFLEX MICROSCOPIC
Bilirubin Urine: NEGATIVE
Glucose, UA: NEGATIVE mg/dL
Hgb urine dipstick: NEGATIVE
Ketones, ur: NEGATIVE mg/dL
Nitrite: NEGATIVE
Protein, ur: NEGATIVE mg/dL
Specific Gravity, Urine: 1.015 (ref 1.005–1.030)
pH: 5 (ref 5.0–8.0)

## 2023-11-26 LAB — COMPREHENSIVE METABOLIC PANEL WITH GFR
ALT: 8 U/L (ref 0–44)
AST: 18 U/L (ref 15–41)
Albumin: 3.2 g/dL — ABNORMAL LOW (ref 3.5–5.0)
Alkaline Phosphatase: 92 U/L (ref 38–126)
Anion gap: 11 (ref 5–15)
BUN: 14 mg/dL (ref 8–23)
CO2: 24 mmol/L (ref 22–32)
Calcium: 8.9 mg/dL (ref 8.9–10.3)
Chloride: 97 mmol/L — ABNORMAL LOW (ref 98–111)
Creatinine, Ser: 0.74 mg/dL (ref 0.44–1.00)
GFR, Estimated: >60 mL/min (ref >60)
Glucose, Bld: 150 mg/dL — ABNORMAL HIGH (ref 70–99)
Potassium: 4.4 mmol/L (ref 3.5–5.1)
Sodium: 132 mmol/L — ABNORMAL LOW (ref 135–145)
Total Bilirubin: 0.8 mg/dL (ref 0.0–1.2)
Total Protein: 6.3 g/dL — ABNORMAL LOW (ref 6.5–8.1)

## 2023-11-26 LAB — CBG MONITORING, ED: Glucose-Capillary: 143 mg/dL — ABNORMAL HIGH (ref 70–99)

## 2023-11-26 LAB — GLUCOSE, CAPILLARY: Glucose-Capillary: 127 mg/dL — ABNORMAL HIGH (ref 70–99)

## 2023-11-26 MED ORDER — LORATADINE 10 MG PO TABS
10.0000 mg | ORAL_TABLET | Freq: Every day | ORAL | Status: DC
  Administered 2023-11-26 – 2023-11-28 (×3): 10 mg via ORAL
  Filled 2023-11-26 (×3): qty 1

## 2023-11-26 MED ORDER — ONDANSETRON HCL 4 MG/2ML IJ SOLN
4.0000 mg | Freq: Once | INTRAMUSCULAR | Status: AC
  Administered 2023-11-26: 4 mg via INTRAVENOUS
  Filled 2023-11-26: qty 2

## 2023-11-26 MED ORDER — LEVOCETIRIZINE DIHYDROCHLORIDE 5 MG PO TABS: 5.0000 mg | ORAL_TABLET | Freq: Every day | ORAL | Status: DC

## 2023-11-26 MED ORDER — PANTOPRAZOLE SODIUM 40 MG PO TBEC
40.0000 mg | DELAYED_RELEASE_TABLET | Freq: Every day | ORAL | Status: DC
  Administered 2023-11-26 – 2023-11-29 (×4): 40 mg via ORAL
  Filled 2023-11-26 (×4): qty 1

## 2023-11-26 MED ORDER — LEVOTHYROXINE SODIUM 88 MCG PO TABS
88.0000 ug | ORAL_TABLET | Freq: Every day | ORAL | Status: DC
Start: 2023-11-27 — End: 2023-11-29
  Administered 2023-11-27 – 2023-11-29 (×3): 88 ug via ORAL
  Filled 2023-11-26 (×3): qty 1

## 2023-11-26 MED ORDER — ASPIRIN 81 MG PO TBEC
81.0000 mg | DELAYED_RELEASE_TABLET | Freq: Every day | ORAL | Status: DC
Start: 2023-11-26 — End: 2023-11-29
  Administered 2023-11-26 – 2023-11-29 (×4): 81 mg via ORAL
  Filled 2023-11-26 (×4): qty 1

## 2023-11-26 MED ORDER — DULOXETINE HCL 60 MG PO CPEP
60.0000 mg | ORAL_CAPSULE | Freq: Every day | ORAL | Status: DC
  Administered 2023-11-26 – 2023-11-28 (×3): 60 mg via ORAL
  Filled 2023-11-26 (×3): qty 1

## 2023-11-26 NOTE — Plan of Care (Signed)
 On-call neurology note  83 year old woman with a history of left CRAO and multiple small acute infarcts scattered within bilateral cerebral hemisphere with strong suspicion for cardioembolic source, loop recorder in place, currently on aspirin , presents for evaluation of acute dizziness going on for 3 to 4 days.  MRI of the brain with a punctate right cerebellar infarct. Looking at the size of that stroke, it is very less likely to cause disabling dizziness   She has had a full stroke workup done about 2 months ago and has a loop recorder in place.  She also had a TEE done.  At this point, I would recommend continuing her aspirin , interrogating pacemaker, and evaluating therapy assessments to see if she is safe for discharge.  She can follow-up with outpatient neurology for her continuing care.  If the loop recorder interrogation shows atrial fibrillation, she might need to be started on anticoagulation at that time.  This was discussed with Dr. Trine Eligio Lav, MD Neurology

## 2023-11-26 NOTE — ED Notes (Addendum)
 Patient resting comfortably

## 2023-11-26 NOTE — H&P (Addendum)
 History and Physical    Patient: Annette Gross FMW:[id number] DOB: 1940/12/08 DOA: 11/26/2023 DOS: the patient was seen and examined on 11/26/2023 PCP: Alys Schuyler HERO, PA  Patient coming from: Home  Chief Complaint:  Chief Complaint  Patient presents with   Dizziness   HPI: Annette Gross is a 83 y.o. female with medical history significant of type 2 diabetes mellitus, hypertension, GERD, factor V Leiden mutation, hypothyroidism, depression, macular degeneration and recent left CRAO and multiple small acute infarcts scattered within bilateral cerebral hemisphere in 09/2023 with strong suspicion for cardioembolic source, loop recorder in place, currently on aspirin , presents for evaluation of acute dizziness going on for 3 to 4 days and found to have punctate right cerebellar infarct on MRI of the brain.  Pt was in her USOH until early this morning after midnight when she woke up and had acute onset dizziness. She was able to make it to the nearby restroom with the aid of her walker, but continued to feel uneasy, so she woke up her son who called EMS.  In the ED, pt AFVSS. Labs unremarkable. MRI showed acute R cerebellar infarct. EDP consulted Neurology who recommended loop recorder interrogation and PT/OT eval. Medicine consult requested in light of poor ambulatory effort.   Review of Systems: As mentioned in the history of present illness. All other systems reviewed and are negative. Past Medical History:  Diagnosis Date   Anemia    many yrs ago   Anxiety    Asthma    no recent issues   Cancer [facility])    s/p thyroidectomy 2018   Complication of anesthesia    Cystitis, interstitial    Degenerative disc disease, lumbar    Degenerative joint disease    spinal stenosis Chronic low back pain   Depression    Diabetes mellitus    diet control.   Diverticulitis 2005   Diverticulosis 2005   Factor V deficiency (HCC)    Fibromyalgia    Fracture of right foot 1995   GERD  (gastroesophageal reflux disease)    History of hiatal hernia    Hyperlipidemia    Hypertension    off med x 5 yrs.   IBS (irritable bowel syndrome)    Insomnia    Iron deficiency    Liver hemangioma 1999   MRI   Mononucleosis    Morton's neuroma    Left foot   Nuclear sclerotic cataract of left eye 08/29/2019   Peripheral neuropathy    Pneumonia    years 89 & 89.  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   CHOLECYSTECTOMY     COLONOSCOPY WITH PROPOFOL  N/A 09/02/2015   Procedure: COLONOSCOPY WITH PROPOFOL ;  Surgeon: Gladis MARLA Louder, MD;  Location: [facility];  Service: Endoscopy;  Laterality: N/A;   GANGLION CYST EXCISION     right   HEMORRHOID SURGERY     HERNIA REPAIR     repair was aimed at the [facility]   HIATAL HERNIA REPAIR     and nissen fundoplication   LAPAROSCOPIC ESOPHAGOGASTRIC FUNDOPLASTY     LAPAROSCOPIC INCISIONAL / UMBILICAL / VENTRAL HERNIA REPAIR     umbilical hernia   LOOP RECORDER  INSERTION N/A 09/13/2023   Procedure: LOOP RECORDER INSERTION;  Surgeon: Leverne Charlies Helling, PA-C;  Location: [facility] INVASIVE CV LAB;  Service: Cardiovascular;  Laterality: N/A;   OOPHORECTOMY     left   RIGHT OOPHORECTOMY     '05-laparaoscopic   SHOULDER SURGERY Right    THYROIDECTOMY N/A 07/06/2016   Procedure: TOTAL THYROIDECTOMY;  Surgeon: Krystal Russell, MD;  Location: [facility] OR;  Service: General;  Laterality: N/A;   TONSILLECTOMY     TOTAL THYROIDECTOMY  07/06/2016   TRANSESOPHAGEAL ECHOCARDIOGRAM (CATH LAB) N/A 09/13/2023   Procedure: TRANSESOPHAGEAL ECHOCARDIOGRAM;  Surgeon: Mona Vinie BROCKS, MD;  Location: [facility]VASIVE CV LAB;  Service: Cardiovascular;  Laterality: N/A;   Social History:  reports that she has never smoked. She has  never used smokeless tobacco. She reports current alcohol use of about 2.0 standard drinks of alcohol per week. She reports that she does not use drugs.  Allergies  Allergen Reactions   Iohexol Anaphylaxis   Ivp Dye [Iodinated Contrast Media] Anaphylaxis   Kiwi Extract Anaphylaxis   Neurontin [Gabapentin] Other (See Comments)    Auditory hallucinations   Sulfa Antibiotics Hives   Asa [Aspirin ] Other (See Comments)    Stomach pain   Cipro [Ciprofloxacin Hcl] Other (See Comments)    Tendonitis    Lexapro [Escitalopram Oxalate] Other (See Comments)    Myalgias    Lipitor [Atorvastatin] Other (See Comments)    Myalgias    Lomotil [Diphenoxylate-Atropine] Other (See Comments)    Severe stomach pain/cramps Dizziness   Lyrica [Pregabalin] Other (See Comments)    Auditory hallucinations   Codeine Other (See Comments)    Severe stomach cramps   Diphenoxylate-Atropine Other (See Comments)    Severe stomach and dizziness    Family History  Problem Relation Age of Onset   Stroke Father    Heart disease Father     Prior to Admission medications   Medication Sig Start Date End Date Taking? Authorizing Provider  acetaminophen  (TYLENOL ) 500 MG tablet Take 1,000 mg by mouth 2 (two) times daily as needed for moderate pain (pain score 4-6) or headache.    [provider]  aspirin  EC 81 MG tablet Take 1 tablet (81 mg total) by mouth daily. Swallow whole. 09/13/23 09/12/24  Fairy Frames, MD  Cholecalciferol (VITAMIN D-3 PO) Take 1 capsule by mouth daily.    [provider]  Cyanocobalamin  (VITAMIN B-12 PO) Take 1 tablet by mouth daily.    [provider]  DULoxetine  (CYMBALTA ) 60 MG capsule Take 60 mg by mouth at bedtime.    [provider]  Evolocumab (REPATHA) 140 MG/ML SOSY Inject 140 mg into the skin See admin instructions. Inject 1 dose (140mg ) subcutaneously every 14 days on every other Tuesday.    [provider]  fluticasone  (FLONASE ) 50  MCG/ACT nasal spray Place 1 spray into the nose daily.    [provider]  levocetirizine (XYZAL ) 5 MG tablet Take 5 mg by mouth at bedtime. 08/09/15   [provider]  levothyroxine  (SYNTHROID ) 88 MCG tablet Take 88 mcg by mouth daily before breakfast.    [provider]  losartan  (COZAAR ) 25 MG tablet Take 25 mg by mouth at bedtime.    [provider]  metFORMIN  (GLUCOPHAGE ) 500 MG tablet Take 1,000 mg by mouth 2 (two) times daily after a meal.    [provider]  Multiple Vitamins-Minerals (PRESERVISION AREDS 2) CAPS Take 1 capsule by mouth daily.    [provider]  omeprazole (PRILOSEC) 40 MG capsule Take 40 mg by mouth at bedtime.    [provider]    Physical Exam: Vitals:   11/26/23 0230 11/26/23 0345 11/26/23 0545 11/26/23 0600  BP: 132/64 110/86  (!) 147/69  Pulse: 86 80  76  Resp: 17 (!) 21  18  Temp:   98.1 F (36.7 C)   TempSrc:   Oral   SpO2: 95% 96%  97%   General: Alert, oriented x3, resting comfortably in no acute distress Respiratory: Lungs clear to auscultation bilaterally with normal respiratory effort; no w/r/r Cardiovascular: Regular rate and rhythm w/o m/r/g   Data Reviewed:  Lab Results  Component Value Date   WBC 9.9 11/26/2023   HGB 13.7 11/26/2023   HCT 41.1 11/26/2023   MCV 89.9 11/26/2023   PLT 408 (H) 11/26/2023   Lab Results  Component Value Date   GLUCOSE 150 (H) 11/26/2023   CALCIUM  8.9 11/26/2023   NA 132 (L) 11/26/2023   K 4.4 11/26/2023   CO2 24 11/26/2023   CL 97 (L) 11/26/2023   BUN 14 11/26/2023   CREATININE 0.74 11/26/2023   Lab Results  Component Value Date   ALT 8 11/26/2023   AST 18 11/26/2023   ALKPHOS 92 11/26/2023   BILITOT 0.8 11/26/2023   Lab Results  Component Value Date   INR 1.0 09/10/2023   INR 1.0 06/17/2007    Radiology: MR BRAIN WO CONTRAST Result Date: 11/26/2023 CLINICAL DATA:  Neuro deficit, acute, stroke suspected EXAM: MRI HEAD WITHOUT  CONTRAST TECHNIQUE: Multiplanar, multiecho pulse sequences of the brain and surrounding structures were obtained without intravenous contrast. COMPARISON:  MRI head Sep 10, 2023 FINDINGS: Brain: Punctate acute infarct in the right cerebellum (series five, image sixty one). No acute hemorrhage, hydrocephalus, extra-axial collection or mass lesion. Multiple small remote lacunar infarcts in the frontal and parietal white matter bilaterally. Patchy T2/FLAIR hyperintensities, compatible with chronic microvascular ischemic change. Vascular: Normal flow voids. Skull and upper cervical spine: Normal marrow signal. Sinuses/Orbits: Negative. IMPRESSION: Punctate acute infarct in the right cerebellum. Electronically Signed   By: Gilmore GORMAN Molt M.D.   On: 11/26/2023 03:24    Assessment and Plan: 69F h/o type 2 diabetes mellitus, hypertension, GERD, factor V Leiden mutation, hypothyroidism, depression, macular degeneration and recent left CRAO and multiple small acute infarcts scattered within bilateral cerebral hemisphere in 09/2023 with strong suspicion for cardioembolic source, loop recorder in place, currently on aspirin , presents for evaluation of acute dizziness going on for 3 to 4 days and found to have punctate right cerebellar infarct on MRI of the brain.  Acute R cerebellar stroke -Neuro following; appreciate eval/recs -Cards consulted for loop recorder interrogation; apprec eval/recs -PTA ASA 81 mg daily -PT/OT/SLP following; appreciate recs -Allow permissive HTN (220/120 due to acute stroke)  HTN -HOLD pta losartan  to allow permissive HTN  Hypothyroid -PTA synthroid  daily   Advance Care Planning:   Code Status: Full Code   Consults: Neurology and Cards  Family Communication: N/A  Severity of Illness: The appropriate patient status for this patient is INPATIENT. Inpatient status is judged to be reasonable and necessary in order to provide the required intensity of service to ensure  the patient's safety. The patient's presenting symptoms, physical exam findings, and initial radiographic and laboratory data in the context of their chronic comorbidities is felt to place them at high risk for further clinical deterioration. Furthermore, it is not anticipated that the patient will be medically stable for discharge from  the hospital within 2 midnights of admission.   * I certify that at the point of admission it is my clinical judgment that the patient will require inpatient hospital care spanning beyond 2 midnights from the point of admission due to high intensity of service, high risk for further deterioration and high frequency of surveillance required.*   ------- I spent 57 minutes reviewing previous labs/notes, obtaining separate history at the bedside, counseling/discussing the treatment plan outlined above, ordering medications/tests, and performing clinical documentation.  Author: Marsha Ada, MD 11/26/2023 7:18 AM  For on call review [email or web address].

## 2023-11-26 NOTE — ED Provider Notes (Signed)
 [facility] EMERGENCY DEPARTMENT AT [facility] Provider Note  CSN: [id number] Arrival date & time: 11/26/23 0135  Chief Complaint(s) Dizziness  HPI Annette Gross is a 83 y.o. female h/o cryptogenic stroke in May   The history is provided by the patient.  Dizziness Quality:  Imbalance Severity:  Moderate Duration:  3 days Timing:  Constant Progression:  Waxing and waning Chronicity:  New Relieved by:  Being still Worsened by:  Sitting upright and standing up Associated symptoms: no blood in stool, no chest pain, no headaches, no nausea, no palpitations, no vomiting and no weakness     Past Medical History Past Medical History:  Diagnosis Date   Anemia    many yrs ago   Anxiety    Asthma    no recent issues   Cancer (HCC)    s/p thyroidectomy 2018   Complication of anesthesia    Cystitis, interstitial    Degenerative disc disease, lumbar    Degenerative joint disease    spinal stenosis Chronic low back pain   Depression    Diabetes mellitus    diet control.   Diverticulitis 2005   Diverticulosis 2005   Factor V deficiency (HCC)    Fibromyalgia    Fracture of right foot 1995   GERD (gastroesophageal reflux disease)    History of hiatal hernia    Hyperlipidemia    Hypertension    off med x 5 yrs.   IBS (irritable bowel syndrome)    Insomnia    Iron deficiency    Liver hemangioma 1999   MRI   Mononucleosis    Morton's neuroma    Left foot   Nuclear sclerotic cataract of left eye 08/29/2019   Peripheral neuropathy    Pneumonia    years 89 & 89.  None since   PONV (postoperative nausea and vomiting)    nausea no vomiting   Rectocele    Transfusion history    child x2, many yrs ago after childbirthhemorrhage   Umbilical hernia 2012   Patient Active Problem List   Diagnosis Date Noted   Hypertension    CVA (cerebral vascular accident) (HCC) 09/10/2023   Stroke (cerebrum) (HCC) 09/10/2023   Pseudophakia, both eyes 10/13/2021    Dermatochalasis of both lower eyelids 10/13/2021   Moderate nonproliferative diabetic retinopathy of left eye (HCC) 10/10/2020   Moderate nonproliferative diabetic retinopathy of right eye (HCC) 08/29/2019   Early stage nonexudative age-related macular degeneration of both eyes 08/29/2019   Right posterior capsular opacification 08/29/2019   Left thyroid  nodule 07/06/2016   Benign neoplasm of skin of upper limb, including shoulder 04/19/2012   Ventral hernia, recurrent 01/26/2011   Home Medication(s) Prior to Admission medications   Medication Sig Start Date End Date Taking? Authorizing Provider  acetaminophen  (TYLENOL ) 500 MG tablet Take 1,000 mg by mouth 2 (two) times daily as needed for moderate pain (pain score 4-6) or headache.    [provider]  aspirin  EC 81 MG tablet Take 1 tablet (81 mg total) by mouth daily. Swallow whole. 09/13/23 09/12/24  Fairy Frames, MD  Cholecalciferol (VITAMIN D-3 PO) Take 1 capsule by mouth daily.    [provider]  Cyanocobalamin  (VITAMIN B-12 PO) Take 1 tablet by mouth daily.    [provider]  DULoxetine  (CYMBALTA ) 60 MG capsule Take 60 mg by mouth at bedtime.    [provider]  Evolocumab (REPATHA) 140 MG/ML SOSY Inject 140 mg into the skin See admin instructions. Inject 1  dose (140mg ) subcutaneously every 14 days on every other Tuesday.    [provider]  fluticasone  (FLONASE ) 50 MCG/ACT nasal spray Place 1 spray into the nose daily.    [provider]  levocetirizine (XYZAL ) 5 MG tablet Take 5 mg by mouth at bedtime. 08/09/15   [provider]  levothyroxine  (SYNTHROID ) 88 MCG tablet Take 88 mcg by mouth daily before breakfast.    [provider]  losartan  (COZAAR ) 25 MG tablet Take 25 mg by mouth at bedtime.    [provider]  metFORMIN  (GLUCOPHAGE ) 500 MG tablet Take 1,000 mg by mouth 2 (two) times daily after a meal.    [provider]  Multiple  Vitamins-Minerals (PRESERVISION AREDS 2) CAPS Take 1 capsule by mouth daily.    [provider]  omeprazole (PRILOSEC) 40 MG capsule Take 40 mg by mouth at bedtime.    [provider]                                                                                                                                    Allergies Iohexol, Ivp dye [iodinated contrast media], Kiwi extract, Neurontin [gabapentin], Sulfa antibiotics, Asa [aspirin ], Cipro [ciprofloxacin hcl], Codeine, Diphenoxylate-atropine, Lexapro [escitalopram oxalate], Lipitor [atorvastatin], Lomotil [diphenoxylate-atropine], and Lyrica [pregabalin]  Review of Systems Review of Systems  Cardiovascular:  Negative for chest pain and palpitations.  Gastrointestinal:  Negative for blood in stool, nausea and vomiting.  Neurological:  Positive for dizziness. Negative for weakness and headaches.   As noted in HPI  Physical Exam Vital Signs  I have reviewed the triage vital signs BP (!) 147/69   Pulse 76   Temp 98.1 F (36.7 C) (Oral)   Resp 18   SpO2 97%   Physical Exam Vitals reviewed.  Constitutional:      General: She is not in acute distress.    Appearance: She is well-developed. She is not diaphoretic.  HENT:     Head: Normocephalic and atraumatic.     Nose: Nose normal.  Eyes:     General: No scleral icterus.       Right eye: No discharge.        Left eye: No discharge.     Conjunctiva/sclera: Conjunctivae normal.     Pupils: Pupils are equal, round, and reactive to light.  Cardiovascular:     Rate and Rhythm: Normal rate and regular rhythm.     Heart sounds: No murmur heard.    No friction rub. No gallop.  Pulmonary:     Effort: Pulmonary effort is normal. No respiratory distress.     Breath sounds: Normal breath sounds. No stridor. No rales.  Abdominal:     General: There is no distension.     Palpations: Abdomen is soft.     Tenderness: There is no abdominal tenderness.  Musculoskeletal:         General: No tenderness.  Cervical back: Normal range of motion and neck supple.  Skin:    General: Skin is warm and dry.     Findings: No erythema or rash.  Neurological:     Mental Status: She is alert and oriented to person, place, and time.     Comments: Mental Status:  Alert and oriented to person, place, and time.  Attention and concentration normal.  Speech clear.  Recent memory is intact  Cranial Nerves:  II Visual Fields: Intact to confrontation. Visual fields intact. III, IV, VI: Pupils equal and reactive to light and near. Full eye movement without nystagmus  V Facial Sensation: Normal. No weakness of masticatory muscles  VII: No facial weakness or asymmetry  VIII Auditory Acuity: Grossly normal  IX/X: The uvula is midline; the palate elevates symmetrically  XI: Normal sternocleidomastoid and trapezius strength  XII: The tongue is midline. No atrophy or fasciculations.   Motor System: Muscle Strength: 5/5 and symmetric in the upper and lower extremities. No pronation or drift.  Muscle Tone: Tone and muscle bulk are normal in the upper and lower extremities.  Coordination: Intact finger-to-nose.No tremor.  Sensation: Intact to light touch..  Gait: unsteady even with walker      ED Results and Treatments Labs (all labs ordered are listed, but only abnormal results are displayed) Labs Reviewed  COMPREHENSIVE METABOLIC PANEL WITH GFR - Abnormal; Notable for the following components:      Result Value   Sodium 132 (*)    Chloride 97 (*)    Glucose, Bld 150 (*)    Total Protein 6.3 (*)    Albumin 3.2 (*)    All other components within normal limits  CBC - Abnormal; Notable for the following components:   Platelets 408 (*)    All other components within normal limits  URINALYSIS, ROUTINE W REFLEX MICROSCOPIC - Abnormal; Notable for the following components:   APPearance HAZY (*)    Leukocytes,Ua TRACE (*)    Bacteria, UA RARE (*)    All other components  within normal limits  CBG MONITORING, ED - Abnormal; Notable for the following components:   Glucose-Capillary 143 (*)    All other components within normal limits                                                                                                                         EKG  EKG Interpretation Date/Time:  Friday November 26 2023 01:39:20 EDT Ventricular Rate:  82 PR Interval:  255 QRS Duration:  134 QT Interval:  419 QTC Calculation: 490 R Axis:   68  Text Interpretation: Sinus rhythm Prolonged PR interval Right bundle branch block Inferior infarct, age indeterminate No significant change since last tracing Confirmed by Trine Likes [phone]) on 11/26/2023 2:00:34 AM       Radiology MR BRAIN WO CONTRAST Result Date: 11/26/2023 CLINICAL DATA:  Neuro deficit, acute, stroke suspected EXAM: MRI HEAD WITHOUT CONTRAST TECHNIQUE: Multiplanar, multiecho pulse sequences of the brain  and surrounding structures were obtained without intravenous contrast. COMPARISON:  MRI head Sep 10, 2023 FINDINGS: Brain: Punctate acute infarct in the right cerebellum (series five, image sixty one). No acute hemorrhage, hydrocephalus, extra-axial collection or mass lesion. Multiple small remote lacunar infarcts in the frontal and parietal white matter bilaterally. Patchy T2/FLAIR hyperintensities, compatible with chronic microvascular ischemic change. Vascular: Normal flow voids. Skull and upper cervical spine: Normal marrow signal. Sinuses/Orbits: Negative. IMPRESSION: Punctate acute infarct in the right cerebellum. Electronically Signed   By: Gilmore GORMAN Molt M.D.   On: 11/26/2023 03:24    Medications Ordered in ED Medications  aspirin  EC tablet 81 mg (has no administration in time range)  DULoxetine  (CYMBALTA ) DR capsule 60 mg (has no administration in time range)  levocetirizine (XYZAL ) tablet 5 mg (has no administration in time range)  levothyroxine  (SYNTHROID ) tablet 88 mcg (has no administration in  time range)  pantoprazole  (PROTONIX ) EC tablet 40 mg (has no administration in time range)  ondansetron  (ZOFRAN ) injection 4 mg (4 mg Intravenous Given 11/26/23 0321)   Procedures Procedures  (including critical care time) Medical Decision Making / ED Course   Medical Decision Making Amount and/or Complexity of Data Reviewed Labs: ordered. Decision-making details documented in ED Course. Radiology: ordered and independent interpretation performed. Decision-making details documented in ED Course. ECG/medicine tests: ordered and independent interpretation performed. Decision-making details documented in ED Course.  Risk Prescription drug management. Decision regarding hospitalization.    Workup is notable for acute/subacute right cerebellar stroke new from prior. No significant electrolyte derangements. No anemia. Consulted and spoke with Dr. Deedra from neurology who stated that no additional workup will be needed except for  interrogation of the loop recorder to determine if patient has been in and out of A-fib  that would require anticoagulation. Since patient has unsteady with her gait, admission for PT and further workup.      Final Clinical Impression(s) / ED Diagnoses Final diagnoses:  Ataxia  Cerebellar stroke [facility])    This chart was dictated using voice recognition software.  Despite best efforts to proofread,  errors can occur which can change the documentation meaning.    Trine Raynell Moder, MD 11/26/23 [phone]

## 2023-11-26 NOTE — ED Notes (Signed)
 Pt able to walk in room with walker. Pt walking unsteady with walker and stated she is in no pain. Pt reported dizziness and nausea while standing and walking. RN notified.

## 2023-11-26 NOTE — ED Triage Notes (Signed)
 Pt comes from home via [facility] for dizziness for the past 3 days, worse tonight with some nausea. Pt has a loop recorder, hx of afib. PTA received 4mg  of zofran 

## 2023-11-27 DIAGNOSIS — R270 Ataxia, unspecified: Secondary | ICD-10-CM | POA: Diagnosis not present

## 2023-11-27 DIAGNOSIS — E118 Type 2 diabetes mellitus with unspecified complications: Secondary | ICD-10-CM

## 2023-11-27 DIAGNOSIS — I10 Essential (primary) hypertension: Secondary | ICD-10-CM | POA: Diagnosis not present

## 2023-11-27 DIAGNOSIS — I639 Cerebral infarction, unspecified: Secondary | ICD-10-CM | POA: Diagnosis not present

## 2023-11-27 LAB — BASIC METABOLIC PANEL WITH GFR
Anion gap: 8 (ref 5–15)
BUN: 12 mg/dL (ref 8–23)
CO2: 27 mmol/L (ref 22–32)
Calcium: 8.8 mg/dL — ABNORMAL LOW (ref 8.9–10.3)
Chloride: 99 mmol/L (ref 98–111)
Creatinine, Ser: 0.77 mg/dL (ref 0.44–1.00)
GFR, Estimated: >60 mL/min (ref >60)
Glucose, Bld: 141 mg/dL — ABNORMAL HIGH (ref 70–99)
Potassium: 4.1 mmol/L (ref 3.5–5.1)
Sodium: 134 mmol/L — ABNORMAL LOW (ref 135–145)

## 2023-11-27 LAB — GLUCOSE, CAPILLARY
Glucose-Capillary: 110 mg/dL — ABNORMAL HIGH (ref 70–99)
Glucose-Capillary: 149 mg/dL — ABNORMAL HIGH (ref 70–99)

## 2023-11-27 MED ORDER — ACETAMINOPHEN 325 MG PO TABS: 650.0000 mg | ORAL_TABLET | Freq: Four times a day (QID) | ORAL | Status: DC | PRN

## 2023-11-27 MED ORDER — ENOXAPARIN SODIUM 40 MG/0.4ML IJ SOSY
40.0000 mg | PREFILLED_SYRINGE | INTRAMUSCULAR | Status: DC
  Administered 2023-11-27 – 2023-11-28 (×2): 40 mg via SUBCUTANEOUS
  Filled 2023-11-27 (×3): qty 0.4

## 2023-11-27 MED ORDER — ONDANSETRON HCL 4 MG/2ML IJ SOLN
4.0000 mg | Freq: Four times a day (QID) | INTRAMUSCULAR | Status: DC | PRN
  Administered 2023-11-27: 4 mg via INTRAVENOUS
  Filled 2023-11-27 (×2): qty 2

## 2023-11-27 MED ORDER — CLOPIDOGREL BISULFATE 75 MG PO TABS
75.0000 mg | ORAL_TABLET | Freq: Every day | ORAL | Status: DC
  Administered 2023-11-27 – 2023-11-29 (×3): 75 mg via ORAL
  Filled 2023-11-27 (×3): qty 1

## 2023-11-27 MED ORDER — MECLIZINE HCL 12.5 MG PO TABS
12.5000 mg | ORAL_TABLET | Freq: Three times a day (TID) | ORAL | Status: DC | PRN
  Filled 2023-11-27: qty 1

## 2023-11-27 MED ORDER — STROKE: EARLY STAGES OF RECOVERY BOOK
Freq: Once | Status: AC
  Filled 2023-11-27: qty 1

## 2023-11-27 MED ORDER — INSULIN ASPART 100 UNIT/ML IJ SOLN
0.0000 [IU] | Freq: Three times a day (TID) | INTRAMUSCULAR | Status: DC
  Administered 2023-11-27: 1 [IU] via SUBCUTANEOUS

## 2023-11-27 NOTE — Progress Notes (Addendum)
 Cardiology asked to assist with ILR interrogation. Reached out to [place] with [employer]. She originally was not yet fully enrolled in remote monitoring so he is helping with that. She had 2 minutes of atrial fib back in May and 5-6 minutes of atrial tach vs atrial flutter on 6/27 but no events since, and <0.1% burden AF/AFL overall. I discussed with Dr. Nancey with EP and this would not be enough burden to explain current stroke. Findings relayed to primary team.

## 2023-11-27 NOTE — TOC Initial Note (Signed)
 Transition of Care [facility]) - Initial/Assessment Note    Patient Details  Name: Annette Gross MRN: [id number] Date of Birth: 1940/05/06  Transition of Care [facility]) CM/SW Contact:    Marval Gell, RN Phone Number: 11/27/2023, 2:15 PM  Clinical Narrative:                  Patient admitted after CVA, PT consult still pending, no HH Hx in [place].  From home w son.  Awaiting final PT OT recs for DME. ICM will continue to follow    Expected Discharge Plan: Home/Self Care Barriers to Discharge: Continued Medical Work up   Patient Goals and CMS Choice Patient states their goals for this hospitalization and ongoing recovery are:: anticipate return home          Expected Discharge Plan and Services   Discharge Planning Services: CM Consult   Living arrangements for the past 2 months: Single Family Home                                      Prior Living Arrangements/Services Living arrangements for the past 2 months: Single Family Home Lives with:: Adult Children                   Activities of Daily Living   ADL Screening (condition at time of admission) Independently performs ADLs?: No Does the patient have a NEW difficulty with bathing/dressing/toileting/self-feeding that is expected to last >3 days?: Yes (Initiates electronic notice to provider for possible OT consult) Does the patient have a NEW difficulty with getting in/out of bed, walking, or climbing stairs that is expected to last >3 days?: Yes (Initiates electronic notice to provider for possible PT consult) Does the patient have a NEW difficulty with communication that is expected to last >3 days?: No Is the patient deaf or have difficulty hearing?: No Does the patient have difficulty seeing, even when wearing glasses/contacts?: No Does the patient have difficulty concentrating, remembering, or making decisions?: No  Permission Sought/Granted                  Emotional Assessment               Admission diagnosis:  Ataxia [R27.0] CVA (cerebral vascular accident) (HCC) [I63.9] Cerebellar stroke (HCC) [I63.9] Patient Active Problem List   Diagnosis Date Noted   Hypertension    CVA (cerebral vascular accident) (HCC) 09/10/2023   Stroke (cerebrum) (HCC) 09/10/2023   Pseudophakia, both eyes 10/13/2021   Dermatochalasis of both lower eyelids 10/13/2021   Moderate nonproliferative diabetic retinopathy of left eye (HCC) 10/10/2020   Moderate nonproliferative diabetic retinopathy of right eye (HCC) 08/29/2019   Early stage nonexudative age-related macular degeneration of both eyes 08/29/2019   Right posterior capsular opacification 08/29/2019   Left thyroid  nodule 07/06/2016   Benign neoplasm of skin of upper limb, including shoulder 04/19/2012   Ventral hernia, recurrent 01/26/2011   PCP:  Ransom Other, MD Pharmacy:   [id number] [address] [place], [place] - [address] [place] [address] Phone: [phone] Fax: [phone]  [facility]) - [place], [place] - [address] [facility] [address] [place] [address] [place] [address] Phone: [phone] Fax: [phone]  [facility] [address] [place] [address] Phone: [phone] Fax: [phone]     Social Drivers of Health (SDOH) Social History:  SDOH Screenings   Food Insecurity: No Food Insecurity (11/26/2023)  Housing: Unknown (11/26/2023)  Transportation Needs: No Transportation Needs (11/26/2023)  Utilities: Not At Risk (11/26/2023)  Social Connections: Socially Isolated (11/26/2023)  Tobacco Use: Low Risk  (11/26/2023)   SDOH Interventions:     Readmission Risk Interventions     No data to display

## 2023-11-27 NOTE — Evaluation (Signed)
 Physical Therapy Evaluation Patient Details Name: Annette Gross MRN: [id number] DOB: October 05, 1940 Today's Date: 11/27/2023  History of Present Illness  Annette Gross is a 83 y.o. female with medical history significant of type 2 diabetes mellitus, hypertension, GERD, factor V Leiden mutation, hypothyroidism, depression, macular degeneration and recent left CRAO and multiple small acute infarcts scattered within bilateral cerebral hemisphere in 09/2023 with strong suspicion for cardioembolic source, loop recorder in place, currently on aspirin , presents for evaluation of acute dizziness going on for 3 to 4 days and found to have punctate right cerebellar infarct on MRI of the brain.  Clinical Impression  PTA pt reports living with family in multistory home with bed and bath on first floor and 5 steps to enter. Pt reports that she was independent with ambulation without AD, ADLs, and iADLs. Pt is currently severely limited in safe mobility effects of cerebellar CVA, which has impaired her balance and coordination, and has caused increased dizziness with positional change. Pt requires min A for coming to EOB, as upon coming to upright she experiences increased dizziness,and increased posterior lean. With cues for gaze stabilization pt reports that dizziness is less. Attempted face to face sit to stand transfer. On first attempt, pt requires total A for coming to upright but is unable to maintain given increased posterior lean and trunk movement. On second attempt, PT had pt use gaze stabilization and she is able to come to standing with modA and take lateral steps towards HoB with modA. Pt requires modA for returning to supine. Pt with complaints of increased nausea. RN notified. Patient will benefit from intensive inpatient follow-up therapy, >3 hours/day. PT will continue to follow acutely.       If plan is discharge home, recommend the following: Two people to help with walking and/or transfers;Two people to  help with bathing/dressing/bathroom;Assistance with cooking/housework;Direct supervision/assist for medications management;Direct supervision/assist for financial management;Assist for transportation;Help with stairs or ramp for entrance   Can travel by private vehicle    No    Equipment Recommendations Wheelchair (measurements PT);Wheelchair cushion (measurements PT)     Functional Status Assessment Patient has had a recent decline in their functional status and demonstrates the ability to make significant improvements in function in a reasonable and predictable amount of time.     Precautions / Restrictions Precautions Precautions: Fall Restrictions Weight Bearing Restrictions Per Provider Order: No      Mobility  Bed Mobility Overal bed mobility: Needs Assistance Bed Mobility: Supine to Sit, Sit to Supine     Supine to sit: Min assist Sit to supine: Mod assist   General bed mobility comments: HoB elevated and pt with good initial movement but quickly becomes uncoordinated, pt report increased dizziness and requiries min A for bringing trunk to upright, once there pt requires min A for steadying, pt requires modA for returning LE to bed,    Transfers Overall transfer level: Needs assistance Equipment used: 1 person hand held assist Transfers: Sit to/from Stand, Bed to chair/wheelchair/BSC Sit to Stand: Total assist, Mod assist          Lateral/Scoot Transfers: Mod assist General transfer comment: initial attempt pt requiring total A to reach upright but is unable to maintain due to lack of coordination. on second attempt had pt practice gaze stabilization and pt able to stand with minA pt reports she is still dizzy but her body cooperates more, pt is modA for stabilizing knees with steps    Ambulation/Gait  General Gait Details: unable due to impaired coordination and dizziness      Balance Overall balance assessment: Needs  assistance Sitting-balance support: Single extremity supported, Feet supported Sitting balance-Leahy Scale: Poor Sitting balance - Comments: posterior instability with dynamic sitting balance   Standing balance support: Bilateral upper extremity supported, During functional activity, Reliant on assistive device for balance Standing balance-Leahy Scale: Poor Standing balance comment: reliant on BUE support and assist of therapist to achieve balance, pt able to achieve balance but requires BUE support to maintain                             Pertinent Vitals/Pain Pain Assessment Pain Assessment: No/denies pain    Home Living Family/patient expects to be discharged to:: Private residence Living Arrangements: Children Available Help at Discharge: Family;Available 24 hours/day Type of Home: House Home Access: Stairs to enter Entrance Stairs-Rails: Right;Left Entrance Stairs-Number of Steps: 5 Alternate Level Stairs-Number of Steps: does not go up them Home Layout: Able to live on main level with bedroom/bathroom;Two level;Full bath on main level;Laundry or work area in [employer] Equipment: Environmental education officer Comments: pt reports 1 fall 2 weeks ago, slipped on wet surface and fell into a flower bed, declined injury    Prior Function Prior Level of Function : Independent/Modified Independent;Driving;History of Falls (last six months)             Mobility Comments: ind no AD in home, uses SPC sometimes in community ADLs Comments: ind; manages her own medicaitions     Extremity/Trunk Assessment   Upper Extremity Assessment Upper Extremity Assessment: Defer to OT evaluation RUE Deficits / Details: strength 4/5 sensation intact RUE Coordination: decreased fine motor LUE Deficits / Details: hx of rotator cuff injury and bone spurs, limited shoulder ROM; strength 4/5 grossly;sensation intact LUE Sensation: WNL LUE Coordination: decreased fine motor    Lower  Extremity Assessment Lower Extremity Assessment: Generalized weakness;RLE deficits/detail;LLE deficits/detail RLE Deficits / Details: ROM WFL, strength grossly 4/5 RLE Sensation: WNL RLE Coordination: decreased fine motor;decreased gross motor LLE Deficits / Details: ROM WFL, strength grossly 4/5 LLE Sensation: WNL LLE Coordination: decreased fine motor;decreased gross motor    Cervical / Trunk Assessment Cervical / Trunk Assessment: Normal  Communication   Communication Communication: No apparent difficulties    Cognition Arousal: Alert Behavior During Therapy: WFL for tasks assessed/performed                             Following commands: Intact       Cueing Cueing Techniques: Verbal cues, Gestural cues, Tactile cues     General Comments General comments (skin integrity, edema, etc.): HR in 110s with attempts to move and increased instability, HR returns to 80s when balance achieved        Assessment/Plan    PT Assessment Patient needs continued PT services  PT Problem List Decreased activity tolerance;Decreased balance;Decreased mobility;Decreased coordination       PT Treatment Interventions DME instruction;Gait training;Functional mobility training;Therapeutic activities;Therapeutic exercise;Stair training;Balance training;Neuromuscular re-education;Cognitive remediation;Patient/family education    PT Goals (Current goals can be found in the Care Plan section)  Acute Rehab PT Goals PT Goal Formulation: With patient Time For Goal Achievement: 12/11/23 Potential to Achieve Goals: Fair    Frequency Min 3X/week        AM-PAC PT 6 Clicks Mobility  Outcome Measure Help needed turning from your back to  your side while in a flat bed without using bedrails?: None Help needed moving from lying on your back to sitting on the side of a flat bed without using bedrails?: A Lot Help needed moving to and from a bed to a chair (including a wheelchair)?: A  Lot Help needed standing up from a chair using your arms (e.g., wheelchair or bedside chair)?: A Lot Help needed to walk in hospital room?: Total Help needed climbing 3-5 steps with a railing? : Total 6 Click Score: 12    End of Session Equipment Utilized During Treatment: Gait belt Activity Tolerance: Treatment limited secondary to medical complications (Comment) (dizziness and nausea) Patient left: in bed;with call bell/phone within reach;with bed alarm set Nurse Communication: Mobility status PT Visit Diagnosis: Unsteadiness on feet (R26.81);Other abnormalities of gait and mobility (R26.89);Muscle weakness (generalized) (M62.81);Difficulty in walking, not elsewhere classified (R26.2);Other symptoms and signs involving the nervous system (R29.898);Dizziness and giddiness (R42)    Time: 1432-1450 PT Time Calculation (min) (ACUTE ONLY): 18 min   Charges:   PT Evaluation $PT Eval Moderate Complexity: 1 Mod   PT General Charges $$ ACUTE PT VISIT: 1 Visit         Autumne Kallio B. Fleeta Lapidus PT, DPT Acute Rehabilitation Services Please use secure chat or  Call Office [phone]   Almarie KATHEE Fleeta [facility] 11/27/2023, 3:30 PM

## 2023-11-27 NOTE — Evaluation (Addendum)
 Occupational Therapy Evaluation Patient Details Name: Annette Gross MRN: [id number] DOB: [birth year] Today's Date: 11/27/2023   History of Present Illness   Annette Gross is a 83 y.o. female with medical history significant of type 2 diabetes mellitus, hypertension, GERD, factor V Leiden mutation, hypothyroidism, depression, macular degeneration and recent left CRAO and multiple small acute infarcts scattered within bilateral cerebral hemisphere in 09/2023 with strong suspicion for cardioembolic source, loop recorder in place, currently on aspirin , presents for evaluation of acute dizziness going on for 3 to 4 days and found to have punctate right cerebellar infarct on MRI of the brain.     Clinical Impressions PTA, pt reports she was independent with ADL/IADL and functional mobility. Pt reports her son and son's girlfriend and her adult child live with her and are able to support 24/7 during the summer. Pt reports her son's girlfriend is a Runner, broadcasting/film/video. She reports 1 fall 2 weeks ago resulting from slipping on a wet surface, denies injury. Pt had 1 posterior loss of balance with initial stand and only single hha from therapist. She then progressed to minA for standing and 5 steps forward/backward. She required minA for sitting balance during LB dressing simulation. Pt was limited this session secondary to fatigue and request to rest. Vss throughout session HR spiked to 123bpm with initial posterior LOB otherwise WNL. BP WNL (see below) and pt denied dizziness. Patient will benefit from intensive inpatient follow-up therapy, >3 hours/day. Will continue to follow acutely and progress appropriately.   Semi-fowlers 112/19mmHg Sitting EOB  121/76mmHg Standing 137/52mmHg    If plan is discharge home, recommend the following:   A little help with walking and/or transfers;A little help with bathing/dressing/bathroom;Assistance with cooking/housework;Assist for transportation     Functional Status  Assessment   Patient has had a recent decline in their functional status and demonstrates the ability to make significant improvements in function in a reasonable and predictable amount of time.     Equipment Recommendations   BSC/3in1     Recommendations for Other Services         Precautions/Restrictions   Precautions Precautions: Fall Restrictions Weight Bearing Restrictions Per Provider Order: No     Mobility Bed Mobility Overal bed mobility: Modified Independent             General bed mobility comments: hob elevated no assistance from therapist provided    Transfers Overall transfer level: Needs assistance Equipment used: Rolling walker (2 wheels) Transfers: Sit to/from Stand, Bed to chair/wheelchair/BSC Sit to Stand: Min assist     Step pivot transfers: Min assist     General transfer comment: minA for safety and stability with powerup and support in standing. Pt requiring increased assistance with initial stand without RW, pt with improved stability with use of RW for second standing trial.      Balance Overall balance assessment: Needs assistance Sitting-balance support: Single extremity supported, Feet supported Sitting balance-Leahy Scale: Poor Sitting balance - Comments: posterior instability with dynamic sitting balance   Standing balance support: Bilateral upper extremity supported, During functional activity, Reliant on assistive device for balance Standing balance-Leahy Scale: Poor Standing balance comment: reliant on BUE support and minA from therapist for stability in standing. initial stand pt with posterior loss of balance requiring assistance from therapis to correct, pt with improved stability with 2nd trial and use of RW (noted posterior instability but no lob)  ADL either performed or assessed with clinical judgement   ADL Overall ADL's : Needs assistance/impaired Eating/Feeding: Independent    Grooming: Set up;Sitting   Upper Body Bathing: Set up;Sitting   Lower Body Bathing: Minimal assistance;Sit to/from stand   Upper Body Dressing : Set up;Sitting   Lower Body Dressing: Minimal assistance;Sit to/from stand Lower Body Dressing Details (indicate cue type and reason): pt able to figure-4 to don socks, requires minA for trunk support due to posterior lean, reports she usually gets dressed while sitting in supportive chair Toilet Transfer: Minimal assistance;BSC/3in1;Rolling walker (2 wheels) Toilet Transfer Details (indicate cue type and reason): simulated around EOB, pt would be able to tolerate stand pivot from EOB to [facility] with RW and minA for stability and safety. Toileting- Clothing Manipulation and Hygiene: Minimal assistance;Sit to/from stand       Functional mobility during ADLs: Minimal assistance;Rolling walker (2 wheels) General ADL Comments: pt with initial instability and minor loss of balance with initial stand, improved with second STS and use of RW.     Vision Baseline Vision/History: 1 Wears glasses Ability to See in Adequate Light: 2 Moderately impaired Patient Visual Report: Other (comment) (pt denies acute changes, reports left inferior field deficit from previous CRAO) Additional Comments: pt denied acute changes, did not formally assess this date     Perception         Praxis         Pertinent Vitals/Pain Pain Assessment Pain Assessment: No/denies pain     Extremity/Trunk Assessment Upper Extremity Assessment Upper Extremity Assessment: Generalized weakness;LUE deficits/detail;RUE deficits/detail RUE Deficits / Details: strength 4/5 sensation intact RUE Coordination: decreased fine motor LUE Deficits / Details: hx of rotator cuff injury and bone spurs, limited shoulder ROM; strength 4/5 grossly;sensation intact LUE Sensation: WNL LUE Coordination: decreased fine motor   Lower Extremity Assessment Lower Extremity Assessment: Defer to PT  evaluation;Generalized weakness   Cervical / Trunk Assessment Cervical / Trunk Assessment: Normal   Communication Communication Communication: No apparent difficulties   Cognition Arousal: Alert Behavior During Therapy: WFL for tasks assessed/performed Cognition: No apparent impairments                               Following commands: Intact       Cueing  General Comments      vss on RA   Exercises     Shoulder Instructions      Home Living Family/patient expects to be discharged to:: Private residence Living Arrangements: Children Available Help at Discharge: Family;Available 24 hours/day Type of Home: House Home Access: Stairs to enter [employer] of Steps: 5 Entrance Stairs-Rails: Right;Left Home Layout: Able to live on main level with bedroom/bathroom;Two level;Full bath on main level;Laundry or work area in [employer] of Steps: does not go up them   Bathroom Shower/Tub: Theme park manager: Yes   Home Equipment: TEFL teacher Comments: pt reports 1 fall 2 weeks ago, slipped on wet surface and fell into a flower bed, declined injury  Lives With: Son;Other (Comment) (son's girlfriend and girlfriend's adult children)    Prior Functioning/Environment Prior Level of Function : Independent/Modified Independent;Driving;History of Falls (last six months)             Mobility Comments: ind no AD in home, uses SPC sometimes in community ADLs Comments: ind; manages her own medicaitions    OT Problem List:  Decreased strength;Decreased activity tolerance;Impaired balance (sitting and/or standing);Decreased coordination;Decreased safety awareness   OT Treatment/Interventions: Self-care/ADL training;Therapeutic exercise;Energy conservation;DME and/or AE instruction;Therapeutic activities;Patient/family education;Balance training      OT Goals(Current  goals can be found in the care plan section)   Acute Rehab OT Goals Patient Stated Goal: to go home OT Goal Formulation: With patient Time For Goal Achievement: 12/11/23 Potential to Achieve Goals: Good ADL Goals Pt Will Perform Grooming: standing;with supervision Pt Will Perform Lower Body Dressing: with supervision;sit to/from stand Pt Will Transfer to Toilet: with supervision;ambulating Additional ADL Goal #1: Pt will demonstrate independence wtih 3 fall prevention strategies for safe engagment in ADL/IADL and functional mobility.   OT Frequency:  Min 2X/week    Co-evaluation              AM-PAC OT 6 Clicks Daily Activity     Outcome Measure Help from another person eating meals?: None Help from another person taking care of personal grooming?: A Little Help from another person toileting, which includes using toliet, bedpan, or urinal?: A Little Help from another person bathing (including washing, rinsing, drying)?: A Little Help from another person to put on and taking off regular upper body clothing?: A Little Help from another person to put on and taking off regular lower body clothing?: A Little 6 Click Score: 19   End of Session Equipment Utilized During Treatment: Gait belt;Rolling walker (2 wheels) Nurse Communication: Mobility status  Activity Tolerance: Patient tolerated treatment well Patient left: in bed;with call bell/phone within reach;with bed alarm set  OT Visit Diagnosis: Other abnormalities of gait and mobility (R26.89);Muscle weakness (generalized) (M62.81)                Time: 8792-8774 OT Time Calculation (min): 18 min Charges:  OT General Charges $OT Visit: 1 Visit OT Evaluation $OT Eval Moderate Complexity: 1 Mod  Mehak Roskelley OTR/L Acute Rehabilitation Services Office: [phone]   Verneita ONEIDA Moose 11/27/2023, 1:34 PM

## 2023-11-27 NOTE — Plan of Care (Signed)

## 2023-11-27 NOTE — Progress Notes (Signed)
 BP laying 112/61 HR 100, Sitting 125/75 HR 105, standing 113/68 HR 108. Patient was unable to tolerate waiting the full 2 minutes after standing because she was very dizzy and unsteady. Dr Raenelle made aware.

## 2023-11-27 NOTE — Plan of Care (Signed)

## 2023-11-27 NOTE — Evaluation (Signed)
 Speech Language Pathology Evaluation Patient Details Name: Annette Gross MRN: [id number] DOB: [birth year] Today's Date: 11/27/2023 Time: 8849-8795 SLP Time Calculation (min) (ACUTE ONLY): 14 min  Problem List:  Patient Active Problem List   Diagnosis Date Noted   Hypertension    CVA (cerebral vascular accident) (HCC) 09/10/2023   Stroke (cerebrum) (HCC) 09/10/2023   Pseudophakia, both eyes 10/13/2021   Dermatochalasis of both lower eyelids 10/13/2021   Moderate nonproliferative diabetic retinopathy of left eye (HCC) 10/10/2020   Moderate nonproliferative diabetic retinopathy of right eye (HCC) 08/29/2019   Early stage nonexudative age-related macular degeneration of both eyes 08/29/2019   Right posterior capsular opacification 08/29/2019   Left thyroid  nodule 07/06/2016   Benign neoplasm of skin of upper limb, including shoulder 04/19/2012   Ventral hernia, recurrent 01/26/2011   Past Medical History:  Past Medical History:  Diagnosis Date   Anemia    many yrs ago   Anxiety    Asthma    no recent issues   Cancer (HCC)    s/p thyroidectomy 2018   Complication of anesthesia    Cystitis, interstitial    Degenerative disc disease, lumbar    Degenerative joint disease    spinal stenosis Chronic low back pain   Depression    Diabetes mellitus    diet control.   Diverticulitis 2005   Diverticulosis 2005   Factor V deficiency (HCC)    Fibromyalgia    Fracture of right foot 1995   GERD (gastroesophageal reflux disease)    History of hiatal hernia    Hyperlipidemia    Hypertension    off med x 5 yrs.   IBS (irritable bowel syndrome)    Insomnia    Iron deficiency    Liver hemangioma 1999   MRI   Mononucleosis    Morton's neuroma    Left foot   Nuclear sclerotic cataract of left eye 08/29/2019   Peripheral neuropathy    Pneumonia    years 89 & 89.  None since   PONV (postoperative nausea and vomiting)    nausea no vomiting   Rectocele    Transfusion history     child x2, many yrs ago after childbirthhemorrhage   Umbilical hernia 2012   Past Surgical History:  Past Surgical History:  Procedure Laterality Date   blood vessel tumor removal  1980   from chin   CHOLECYSTECTOMY     COLONOSCOPY WITH PROPOFOL  N/A 09/02/2015   Procedure: COLONOSCOPY WITH PROPOFOL ;  Surgeon: Gladis MARLA Louder, MD;  Location: [facility];  Service: Endoscopy;  Laterality: N/A;   GANGLION CYST EXCISION     right   HEMORRHOID SURGERY     HERNIA REPAIR     repair was aimed at the [facility]   HIATAL HERNIA REPAIR     and nissen fundoplication   LAPAROSCOPIC ESOPHAGOGASTRIC FUNDOPLASTY     LAPAROSCOPIC INCISIONAL / UMBILICAL / VENTRAL HERNIA REPAIR     umbilical hernia   LOOP RECORDER INSERTION N/A 09/13/2023   Procedure: LOOP RECORDER INSERTION;  Surgeon: Leverne Charlies Helling, PA-C;  Location: [facility] INVASIVE CV LAB;  Service: Cardiovascular;  Laterality: N/A;   OOPHORECTOMY     left   RIGHT OOPHORECTOMY     '05-laparaoscopic   SHOULDER SURGERY Right    THYROIDECTOMY N/A 07/06/2016   Procedure: TOTAL THYROIDECTOMY;  Surgeon: Krystal Russell, MD;  Location: [facility] OR;  Service: General;  Laterality: N/A;   TONSILLECTOMY     TOTAL THYROIDECTOMY  07/06/2016   TRANSESOPHAGEAL ECHOCARDIOGRAM (  CATH LAB) N/A 09/13/2023   Procedure: TRANSESOPHAGEAL ECHOCARDIOGRAM;  Surgeon: Mona Vinie BROCKS, MD;  Location: [facility] INVASIVE CV LAB;  Service: Cardiovascular;  Laterality: N/A;   HPI:  Patient is a 83 y.o.  female with history of recurrent CVA-extensive workup May 2025 including loop recorder implantation-presented with vertigo-found to have right cerebellar infarct. Pt previously had a SLUMS of 23/30 on 5/11 at [facility] from CVA and reports attending one of her OPT ST sessions closely prior to this hospital admission.  Pt lives with her son, does not drive after initial stroke in May is independent with all ADL's and iADL's per pt report.   Assessment / Plan / Recommendation Clinical Impression  Pt seen for  skilled ST evaluation of speech-language and cognitive skills. The pt presents with WNL cognition. The pt previously scored a 23/30 on the SLUMS of 5/11, pt scored a 29/30 on the SLUMS- which is considered WNL. She displayed great orientation, recall, executive function, attention, and self-awareness t/o the course of the evaluation. The pt reports noticing some general decline in miscellaneous cognitive and language skills following her previous CVA, and that her single OPT ST session this month was "good for helping her find some tools to help". She does not currently present with a cognitive impairment; however she would benefit from continuation of the OPT ST previously established upon d/c. No further acute needs at this time, SLP signing off.    SLP Assessment  SLP Recommendation/Assessment: All further Speech Language Pathology needs can be addressed in the next venue of care (cont. OPT ST) SLP Visit Diagnosis: Cognitive communication deficit (R41.841)     Assistance Recommended at Discharge     Functional Status Assessment Patient has had a recent decline in their functional status and demonstrates the ability to make significant improvements in function in a reasonable and predictable amount of time.  Frequency and Duration           SLP Evaluation Cognition  Orientation Level: Oriented X4 Month: July Day of Week: Correct Attention: Focused;Sustained Focused Attention: Appears intact Sustained Attention: Appears intact Memory: Appears intact Awareness: Appears intact Problem Solving: Appears intact Executive Function: Organizing;Sequencing;Self Monitoring;Self Correcting Sequencing: Appears intact Organizing: Appears intact Self Monitoring: Appears intact Self Correcting: Appears intact Safety/Judgment: Appears intact Comments: pt displayed great self-awareness and noticable improvement from last cognitive evaluation at [facility]       Comprehension  Auditory  Comprehension Overall Auditory Comprehension: Appears within functional limits for tasks assessed Yes/No Questions: Within Functional Limits Commands: Within Functional Limits Conversation: Complex Visual Recognition/Discrimination Discrimination: Within Function Limits Reading Comprehension Reading Status: Not tested    Expression Expression Primary Mode of Expression: Verbal Verbal Expression Overall Verbal Expression: Appears within functional limits for tasks assessed Initiation: No impairment Pragmatics: No impairment Written Expression Dominant Hand: Right Written Expression: Within Functional Limits   Oral / Motor  Motor Speech Overall Motor Speech: Appears within functional limits for tasks assessed           Manuelita Blew M.S. CCC-SLP

## 2023-11-27 NOTE — Progress Notes (Addendum)
 PROGRESS NOTE        PATIENT DETAILS Name: Annette Gross Age: 83 y.o. Sex: female Date of Birth: [birth year] Admit Date: 11/26/2023 Admitting Physician Marsha Ada, MD Annette Gross, Annette Gross, [name]  Brief Summary: Patient is a 83 y.o.  female with history of recurrent CVA-extensive workup May 2025 including loop recorder implantation-presented with vertigo-found to have right cerebellar infarct.  Significant events: 7/25>> admit to [facility]  Significant studies: 7/25>> MRI brain: Acute infarct right cerebellum  Significant microbiology data: None  Procedures: None  Consults: None  Subjective: Still complains of vertigo/lightheadedness-much worse when she sits up-and stands.  Needed significant assistance even to just go to the bedside commode this morning.  Objective: Vitals: Blood pressure (!) 143/78, pulse 86, temperature 98.1 F (36.7 C), temperature source Oral, resp. rate 13, SpO2 94%.   Exam: Gen Exam:Alert awake-not in any distress HEENT:atraumatic, normocephalic Chest: B/L clear to auscultation anteriorly CVS:S1S2 regular Abdomen:soft non tender, non distended Extremities:no edema Neurology: 4/4 in all 4 extremities. Skin: no rash  Pertinent Labs/Radiology:    Latest Ref Rng & Units 11/26/2023    1:41 AM 09/10/2023    7:29 PM 09/10/2023   12:26 PM  CBC  WBC 4.0 - 10.5 K/uL 9.9  11.4  9.4   Hemoglobin 12.0 - 15.0 g/dL 86.2  86.2  84.9   Hematocrit 36.0 - 46.0 % 41.1  41.2  44.4   Platelets 150 - 400 K/uL 408  433  481     Lab Results  Component Value Date   NA 134 (L) 11/27/2023   K 4.1 11/27/2023   CL 99 11/27/2023   CO2 27 11/27/2023      Assessment/Plan: Acute CVA-right cerebellar ischemic stroke History of recurrent CVA-also has history of left central retinal artery occlusion. Reviewed prior notes--thought to be cardioembolic etiology -has had extensive workup including TEE and loop recorder implantation in May 2025.  Hence  further workup not being pursued this admission. Loop recorder not interrogated on admission-reached out to cardiology-they will try to get the loop recorder interrogated today. Continues to have significant dizziness/lightheadedness-some of it appears to be postural-will order orthostatic vital signs. Needs PT/OT eval Spoke with Dr. Oswaldo loop recorder interrogated-he will see patient later and then make formal recommendations. In the interim-continue aspirin -has known statin intolerance in the past.  Addendum 1.Negative orthostatics-BP laying 112/61 HR 100, Sitting 125/75 HR 105, standing 113/68 HR 108.  2.  Loop recorder interrogated-A-fib burden minimal-Per EP-not enough to explain current stroke.  Subsequently discussed with Dr. Rosemarie over the phone on 7/26-for now-he recommends holding off on anticoagulation-patient has a loop recorder and will be continued to be monitored, he recommends aspirin /Plavix  x 3 weeks followed by Plavix  alone.     HTN BP stable Losartan  on hold.  HLD Statin intolerant On Repatha injections at home  DM-2 (A1c 6.6 on 5/10) Metformin  on hold Placed on SSI  Hypothyroidism  Synthroid   Code status:   Code Status: Full Code   DVT Prophylaxis: enoxaparin  (LOVENOX ) injection 40 mg Start: 11/27/23 1030 SCDs Start: 11/26/23 0716    Family Communication: None at bedside   Disposition Plan: Status is: Inpatient Remains inpatient appropriate because: Severity of illness   Planned Discharge Destination:Home   Diet: Diet Order             Diet Carb Modified Fluid consistency: Thin  Diet effective now                     Antimicrobial agents: Anti-infectives (From admission, onward)    None        MEDICATIONS: Scheduled Meds:  aspirin  EC  81 mg Oral Daily   DULoxetine   60 mg Oral QHS   enoxaparin  (LOVENOX ) injection  40 mg Subcutaneous Q24H   levothyroxine   88 mcg Oral QAC breakfast   loratadine   10 mg Oral QHS    pantoprazole   40 mg Oral Daily   Continuous Infusions: PRN Meds:.acetaminophen , ondansetron  (ZOFRAN ) IV   I have personally reviewed following labs and imaging studies  LABORATORY DATA: CBC: Recent Labs  Lab 11/26/23 0141  WBC 9.9  HGB 13.7  HCT 41.1  MCV 89.9  PLT 408*    Basic Metabolic Panel: Recent Labs  Lab 11/26/23 0141 11/27/23 0527  NA 132* 134*  K 4.4 4.1  CL 97* 99  CO2 24 27  GLUCOSE 150* 141*  BUN 14 12  CREATININE 0.74 0.77  CALCIUM  8.9 8.8*    GFR: CrCl cannot be calculated (Unknown ideal weight.).  Liver Function Tests: Recent Labs  Lab 11/26/23 0141  AST 18  ALT 8  ALKPHOS 92  BILITOT 0.8  PROT 6.3*  ALBUMIN 3.2*   No results for input(s): LIPASE, AMYLASE in the last 168 hours. No results for input(s): AMMONIA in the last 168 hours.  Coagulation Profile: No results for input(s): INR, PROTIME in the last 168 hours.  Cardiac Enzymes: No results for input(s): CKTOTAL, CKMB, CKMBINDEX, TROPONINI in the last 168 hours.  BNP (last 3 results) No results for input(s): PROBNP in the last 8760 hours.  Lipid Profile: No results for input(s): CHOL, HDL, LDLCALC, TRIG, CHOLHDL, LDLDIRECT in the last 72 hours.  Thyroid  Function Tests: No results for input(s): TSH, T4TOTAL, FREET4, T3FREE, THYROIDAB in the last 72 hours.  Anemia Panel: No results for input(s): VITAMINB12, FOLATE, FERRITIN, TIBC, IRON, RETICCTPCT in the last 72 hours.  Urine analysis:    Component Value Date/Time   COLORURINE YELLOW 11/26/2023 0410   APPEARANCEUR HAZY (A) 11/26/2023 0410   LABSPEC 1.015 11/26/2023 0410   PHURINE 5.0 11/26/2023 0410   GLUCOSEU NEGATIVE 11/26/2023 0410   HGBUR NEGATIVE 11/26/2023 0410   BILIRUBINUR NEGATIVE 11/26/2023 0410   KETONESUR NEGATIVE 11/26/2023 0410   PROTEINUR NEGATIVE 11/26/2023 0410   UROBILINOGEN 0.2 07/23/2007 0932   NITRITE NEGATIVE 11/26/2023 0410   LEUKOCYTESUR  TRACE (A) 11/26/2023 0410    Sepsis Labs: Lactic Acid, Venous No results found for: LATICACIDVEN  MICROBIOLOGY: No results found for this or any previous visit (from the past 240 hours).  RADIOLOGY STUDIES/RESULTS: MR BRAIN WO CONTRAST Result Date: 11/26/2023 CLINICAL DATA:  Neuro deficit, acute, stroke suspected EXAM: MRI HEAD WITHOUT CONTRAST TECHNIQUE: Multiplanar, multiecho pulse sequences of the brain and surrounding structures were obtained without intravenous contrast. COMPARISON:  MRI head Sep 10, 2023 FINDINGS: Brain: Punctate acute infarct in the right cerebellum (series five, image sixty one). No acute hemorrhage, hydrocephalus, extra-axial collection or mass lesion. Multiple small remote lacunar infarcts in the frontal and parietal white matter bilaterally. Patchy T2/FLAIR hyperintensities, compatible with chronic microvascular ischemic change. Vascular: Normal flow voids. Skull and upper cervical spine: Normal marrow signal. Sinuses/Orbits: Negative. IMPRESSION: Punctate acute infarct in the right cerebellum. Electronically Signed   By: Gilmore GORMAN Molt M.D.   On: 11/26/2023 03:24     LOS: 1 day   Donalda Applebaum, MD  [facility]    To contact the attending provider between 7A-7P or the covering provider during after hours 7P-7A, please log into the web site [email or web address] and access using universal  password for that web site. If you do not have the password, please call the hospital operator.  11/27/2023, 9:57 AM

## 2023-11-28 DIAGNOSIS — I639 Cerebral infarction, unspecified: Secondary | ICD-10-CM | POA: Diagnosis not present

## 2023-11-28 LAB — GLUCOSE, CAPILLARY
Glucose-Capillary: 116 mg/dL — ABNORMAL HIGH (ref 70–99)
Glucose-Capillary: 111 mg/dL — ABNORMAL HIGH (ref 70–99)
Glucose-Capillary: 111 mg/dL — ABNORMAL HIGH (ref 70–99)
Glucose-Capillary: 145 mg/dL — ABNORMAL HIGH (ref 70–99)

## 2023-11-28 MED ORDER — POLYETHYLENE GLYCOL 3350 17 G PO PACK
17.0000 g | PACK | Freq: Two times a day (BID) | ORAL | Status: DC
  Administered 2023-11-28 – 2023-11-29 (×3): 17 g via ORAL
  Filled 2023-11-28 (×3): qty 1

## 2023-11-28 MED ORDER — MAGNESIUM HYDROXIDE 400 MG/5ML PO SUSP
30.0000 mL | Freq: Two times a day (BID) | ORAL | Status: AC
  Administered 2023-11-28 (×2): 30 mL via ORAL
  Filled 2023-11-28 (×2): qty 30

## 2023-11-28 MED ORDER — LACTATED RINGERS IV SOLN: INTRAVENOUS | Status: DC

## 2023-11-28 MED ORDER — DOCUSATE SODIUM 100 MG PO CAPS
200.0000 mg | ORAL_CAPSULE | Freq: Two times a day (BID) | ORAL | Status: DC
  Administered 2023-11-28 – 2023-11-29 (×3): 200 mg via ORAL
  Filled 2023-11-28 (×3): qty 2

## 2023-11-28 NOTE — Plan of Care (Signed)
  Problem: Education: Goal: Knowledge of General Education information will improve Description: Including pain rating scale, medication(s)/side effects and non-pharmacologic comfort measures Outcome: Progressing   Problem: Health Behavior/Discharge Planning: Goal: Ability to manage health-related needs will improve Outcome: Progressing   Problem: Clinical Measurements: Goal: Diagnostic test results will improve Outcome: Progressing   Problem: Coping: Goal: Level of anxiety will decrease Outcome: Progressing   Problem: Education: Goal: Knowledge of disease or condition will improve Outcome: Progressing

## 2023-11-28 NOTE — Progress Notes (Signed)
 PROGRESS NOTE        PATIENT DETAILS Name: Annette Gross Age: 83 y.o. Sex: female Date of Birth: 1940/05/13 Admit Date: 11/26/2023 Admitting Physician Marsha Ada, MD ERE:Yldjpw, Ardell, MD  Brief Summary: Patient is a 83 y.o.  female with history of recurrent CVA-extensive workup May 2025 including loop recorder implantation-presented with vertigo-found to have right cerebellar infarct.  Significant events: 7/25>> admit to [facility]  Significant studies: 7/25>> MRI brain: Acute infarct right cerebellum  Significant microbiology data: None  Procedures: None  Consults: None  Subjective: Patient in bed, appears comfortable, denies any headache, no fever, no chest pain or pressure, no shortness of breath , no abdominal pain. No focal weakness she is still feeling weak overall somewhat better but not at her baseline yet.  Objective: Vitals: Blood pressure (!) 138/91, pulse 87, temperature 98 F (36.7 C), temperature source Oral, resp. rate 16, SpO2 93%.   Exam:  Awake Alert, No new F.N deficits, Normal affect [place].AT,PERRAL Supple Neck, No JVD,   Symmetrical Chest wall movement, Good air movement bilaterally, CTAB RRR,No Gallops, Rubs or new Murmurs,  +ve B.Sounds, Abd Soft, No tenderness,   No Cyanosis, Clubbing or edema    Assessment/Plan: Acute CVA-right cerebellar ischemic stroke History of recurrent CVA-also has history of left central retinal artery occlusion. Reviewed prior notes--thought to be cardioembolic etiology -has had extensive workup including TEE and loop recorder implantation in May 2025.  Hence further workup not being pursued this admission. Loop recorder interrogated this admission per cardiology had a very brief episode of A-fib in the last few weeks burden not enough to justify DOAC, this has been discussed with stroke team as well.  Continue supportive care with DAPT as below Continues to have significant  dizziness/lightheadedness-some of it appears to be postural-will order orthostatic vital signs. Needs PT/OT eval, continue IV fluids, likely weak due to dehydration, monitor.  Still not close to baseline   Addendum 1.Negative orthostatics-BP laying 112/61 HR 100, Sitting 125/75 HR 105, standing 113/68 HR 108.  2.  Loop recorder interrogated-A-fib burden minimal-Per EP-not enough to explain current stroke.  Subsequently discussed with Dr. Rosemarie over the phone on 7/26-for now-he recommends holding off on anticoagulation-patient has a loop recorder and will be continued to be monitored, he recommends aspirin /Plavix  x 3 weeks followed by Plavix  alone.     HTN BP stable Losartan  on hold.  HLD Statin intolerant On Repatha injections at home  DM-2 (A1c 6.6 on 5/10) Metformin  on hold Placed on SSI  Hypothyroidism  Synthroid   Code status:   Code Status: Full Code   DVT Prophylaxis: enoxaparin  (LOVENOX ) injection 40 mg Start: 11/27/23 1030 SCDs Start: 11/26/23 0716    Family Communication: None at bedside   Disposition Plan: Status is: Inpatient Remains inpatient appropriate because: Severity of illness   Planned Discharge Destination:Home   Diet: Diet Order             Diet Carb Modified Fluid consistency: Thin  Diet effective now                    Data Review:   Inpatient Medications  Scheduled Meds:  aspirin  EC  81 mg Oral Daily   clopidogrel   75 mg Oral Daily   docusate sodium   200 mg Oral BID   DULoxetine   60 mg Oral QHS   enoxaparin  (  LOVENOX ) injection  40 mg Subcutaneous Q24H   insulin  aspart  0-9 Units Subcutaneous TID WC   levothyroxine   88 mcg Oral QAC breakfast   loratadine   10 mg Oral QHS   magnesium  hydroxide  30 mL Oral BID   pantoprazole   40 mg Oral Daily   polyethylene glycol  17 g Oral BID   Continuous Infusions: PRN Meds:.acetaminophen , meclizine , ondansetron  (ZOFRAN ) IV  DVT Prophylaxis  enoxaparin  (LOVENOX ) injection 40 mg Start:  11/27/23 1030 SCDs Start: 11/26/23 0716   Recent Labs  Lab 11/26/23 0141  WBC 9.9  HGB 13.7  HCT 41.1  PLT 408*  MCV 89.9  MCH 30.0  MCHC 33.3  RDW 13.5    Recent Labs  Lab 11/26/23 0141 11/27/23 0527  NA 132* 134*  K 4.4 4.1  CL 97* 99  CO2 24 27  ANIONGAP 11 8  GLUCOSE 150* 141*  BUN 14 12  CREATININE 0.74 0.77  AST 18  --   ALT 8  --   ALKPHOS 92  --   BILITOT 0.8  --   ALBUMIN 3.2*  --   CALCIUM  8.9 8.8*      Recent Labs  Lab 11/26/23 0141 11/27/23 0527  CALCIUM  8.9 8.8*    --------------------------------------------------------------------------------------------------------------- Lab Results  Component Value Date   CHOL 128 09/11/2023   HDL 52 09/11/2023   LDLCALC 30 09/11/2023   TRIG 230 (H) 09/11/2023   CHOLHDL 2.5 09/11/2023    Lab Results  Component Value Date   HGBA1C 6.6 (H) 09/11/2023   No results for input(s): TSH, T4TOTAL, FREET4, T3FREE, THYROIDAB in the last 72 hours. No results for input(s): VITAMINB12, FOLATE, FERRITIN, TIBC, IRON, RETICCTPCT in the last 72 hours. ------------------------------------------------------------------------------------------------------------------ Cardiac Enzymes No results for input(s): CKMB, TROPONINI, MYOGLOBIN in the last 168 hours.  Invalid input(s): CK  Micro Results No results found for this or any previous visit (from the past 240 hours).  Radiology Reports  No results found.    Signature  -   Lavada Stank M.D on 11/28/2023 at 9:59 AM   -  To page go to [email or web address]

## 2023-11-28 NOTE — Progress Notes (Signed)
 Inpatient Rehab Admissions Coordinator Note:   Per therapy patient was screened for CIR candidacy by [name] Annette Gross, CCC-SLP. At this time, pt appears to be a potential candidate for CIR. I will place an order for rehab consult for full assessment, per our protocol.  Please contact me any with questions.Annette Gross [name] Annette Cohens, MS, CCC-SLP Admissions Coordinator [phone] 11/28/23 11:33 AM

## 2023-11-28 NOTE — TOC Progression Note (Signed)
 Transition of Care [facility]) - Progression Note    Patient Details  Name: Annette Gross MRN: [id number] Date of Birth: 07/11/1940  [facility] CM/SW Contact  Marval Gell, RN Phone Number: 11/28/2023, 1:59 PM  Clinical Narrative:     Spoke w patient at bedside. She states that she plans on going home tomorrow, feels she is a lot more steady than yesterday. She has a RW at home, declined WC and requested 3/1. 3/1 ordered through [employer] to be delivered to the room tonight or in the AM.  Patient states that she is active and has upcoming appointments at [facility] for OP PT OT SLP. She wants to continue care there and declined HH and CIR .   Expected Discharge Plan: Home/Self Care Barriers to Discharge: Continued Medical Work up               Expected Discharge Plan and Services   Discharge Planning Services: CM Consult   Living arrangements for the past 2 months: Single Family Home                 DME Arranged: 3-N-1 DME Agency: [employer] Date DME Agency Contacted: 11/28/23 Time DME Agency Contacted: 1359 Representative spoke with at DME Agency: [place]             Social Drivers of Health (SDOH) Interventions SDOH Screenings   Food Insecurity: No Food Insecurity (11/26/2023)  Housing: Unknown (11/26/2023)  Transportation Needs: No Transportation Needs (11/26/2023)  Utilities: Not At Risk (11/26/2023)  Social Connections: Socially Isolated (11/26/2023)  Tobacco Use: Low Risk  (11/26/2023)    Readmission Risk Interventions     No data to display

## 2023-11-28 NOTE — Plan of Care (Signed)
  Problem: Education: Goal: Knowledge of General Education information will improve Description: Including pain rating scale, medication(s)/side effects and non-pharmacologic comfort measures Outcome: Progressing   Problem: Health Behavior/Discharge Planning: Goal: Ability to manage health-related needs will improve Outcome: Progressing   Problem: Clinical Measurements: Goal: Ability to maintain clinical measurements within normal limits will improve Outcome: Progressing   Problem: Activity: Goal: Risk for activity intolerance will decrease Outcome: Progressing   Problem: Coping: Goal: Level of anxiety will decrease Outcome: Progressing   Problem: Coping: Goal: Ability to adjust to condition or change in health will improve Outcome: Progressing

## 2023-11-28 NOTE — Progress Notes (Signed)
 Inpatient Rehab Admissions:  Inpatient Rehab Consult received.  I met with patient at the bedside for rehabilitation assessment and to discuss goals and expectations of an inpatient rehab admission.  Discussed average length of stay, insurance authorization requirement and discharge home after completion of CIR. Pt informed AC that she is discharging home tomorrow. Dr. Dennise confirmed discharge after visit with pt. TOC made aware. AC will sign off.  Signed: [name] Yvone Cohens, MS, CCC-SLP Admissions Coordinator [phone]

## 2023-11-29 ENCOUNTER — Other Ambulatory Visit (HOSPITAL_COMMUNITY): Payer: Self-pay

## 2023-11-29 LAB — GLUCOSE, CAPILLARY: Glucose-Capillary: 106 mg/dL — ABNORMAL HIGH (ref 70–99)

## 2023-11-29 MED ORDER — CLOPIDOGREL BISULFATE 75 MG PO TABS
75.0000 mg | ORAL_TABLET | Freq: Every day | ORAL | 2 refills | Status: DC
Start: 2023-11-29 — End: 2024-03-10
  Filled 2023-11-29: qty 30, 30d supply, fill #0

## 2023-11-29 MED ORDER — MECLIZINE HCL 25 MG PO TABS
25.0000 mg | ORAL_TABLET | Freq: Three times a day (TID) | ORAL | 0 refills | Status: AC | PRN
  Filled 2023-11-29: qty 20, 7d supply, fill #0

## 2023-11-29 MED ORDER — ASPIRIN 81 MG PO TBEC
81.0000 mg | DELAYED_RELEASE_TABLET | Freq: Every day | ORAL | 0 refills | Status: AC
  Filled 2023-11-29: qty 20, 20d supply, fill #0

## 2023-11-29 NOTE — Care Management Important Message (Signed)
 Important Message  Patient Details  Name: Annette Gross MRN: [id number] Date of Birth: 1940-08-25   Important Message Given:  Yes - Medicare IM  Patient left prior to IM delivery will mail a copy to the patient home address.    Omkar Stratmann 11/29/2023, 12:57 PM

## 2023-11-29 NOTE — Discharge Summary (Signed)
 Annette Gross FMW:[id number] DOB: 10-18-1940 DOA: 11/26/2023  PCP: Ransom Other, MD  Admit date: 11/26/2023  Discharge date: 11/29/2023  Admitted From: Home   Disposition:  Home   Recommendations for Outpatient Follow-up:   Follow up with PCP in 1-2 weeks  PCP Please obtain BMP/CBC, 2 view CXR in 1week,  (see Discharge instructions)   PCP Please follow up on the following pending results:    Home Health: PT, OT if qualifies   Equipment/Devices: as below  Consultations: Neuro - over the phone Discharge Condition: Stable    CODE STATUS: Full    Diet Recommendation: Heart Healthy Low Carb    Chief Complaint  Patient presents with   Dizziness     Brief history of present illness from the day of admission and additional interim summary    83 y.o.  female with history of recurrent CVA-extensive workup May 2025 including loop recorder implantation-presented with vertigo-found to have right cerebellar infarct.   Significant events: 7/25>> admit to [facility]   Significant studies: 7/25>> MRI brain: Acute infarct right cerebellum                                                                 Hospital Course   Acute CVA-right cerebellar ischemic stroke History of recurrent CVA-also has history of left central retinal artery occlusion. Reviewed prior notes--thought to be cardioembolic etiology -has had extensive workup including TEE and loop recorder implantation in May 2025.  Hence further workup not being pursued this admission. Loop recorder interrogated this admission per cardiology had a very brief episode of A-fib in the last few weeks burden not enough to justify DOAC, this has been discussed with stroke team as well.  Continue supportive care with DAPT as below Overall symptoms much improved wants to go home  with home PT OT if she qualifies with, does not want to go to SNF or CIR, will be discharged home today.  Feeding whole diet better.  Postdischarge follow-up with PCP and neurology.  Also have her follow-up with cardiology for final loop recorder evaluation in a few weeks.     Addendum 1.Negative orthostatics-BP laying 112/61 HR 100, Sitting 125/75 HR 105, standing 113/68 HR 108.  2.  Loop recorder interrogated-A-fib burden minimal-Per EP-not enough to explain current stroke.  Subsequently discussed with Dr. Rosemarie over the phone on 7/26-for now-he recommends holding off on anticoagulation-patient has a loop recorder and will be continued to be monitored, he recommends aspirin /Plavix  x 3 weeks followed by Plavix  alone.       HTN BP stable Will resume losartan  upon discharge   HLD Statin intolerant On Repatha injections at home   DM-2 (A1c 6.6 on 5/10) Home medications unchanged   Hypothyroidism  Synthroid    Discharge diagnosis     Principal Problem:  CVA (cerebral vascular accident) [facility])    Discharge instructions    Discharge Instructions     Discharge instructions   Complete by: As directed    Follow with Primary MD Ransom Other, MD in 7 days   Get CBC, CMP, Magnesium   -  checked next visit with your primary MD   Activity: As tolerated with Full fall precautions use walker/cane & assistance as needed  Disposition Home    Diet: Heart Healthy Low Carb  Special Instructions: If you have smoked or chewed Tobacco  in the last 2 yrs please stop smoking, stop any regular Alcohol  and or any Recreational drug use.  On your next visit with your primary care physician please Get Medicines reviewed and adjusted.  Please request your Prim.MD to go over all Hospital Tests and Procedure/Radiological results at the follow up, please get all Hospital records sent to your Prim MD by signing hospital release before you go home.  If you experience worsening of your admission  symptoms, develop shortness of breath, life threatening emergency, suicidal or homicidal thoughts you must seek medical attention immediately by calling 911 or calling your MD immediately  if symptoms less severe.  You Must read complete instructions/literature along with all the possible adverse reactions/side effects for all the Medicines you take and that have been prescribed to you. Take any new Medicines after you have completely understood and accpet all the possible adverse reactions/side effects.   Do not drive when taking Pain medications.  Do not take more than prescribed Pain, Sleep and Anxiety Medications  Wear Seat belts while driving.   Increase activity slowly   Complete by: As directed        Discharge Medications   Allergies as of 11/29/2023       Reactions   Iohexol Anaphylaxis   Ivp Dye [iodinated Contrast Media] Anaphylaxis   Kiwi Extract Anaphylaxis   Neurontin [gabapentin] Other (See Comments)   Auditory hallucinations   Sulfa Antibiotics Hives   Asa [aspirin ] Other (See Comments)   Stomach pain   Cipro [ciprofloxacin Hcl] Other (See Comments)   Tendonitis    Codeine Other (See Comments)   Severe stomach cramps   Lexapro [escitalopram Oxalate] Other (See Comments)   Myalgias    Lipitor [atorvastatin] Other (See Comments)   Myalgias    Lomotil [diphenoxylate-atropine] Other (See Comments)   Severe stomach pain/cramps Dizziness   Lyrica [pregabalin] Other (See Comments)   Auditory hallucinations        Medication List     STOP taking these medications    oxybutynin 10 MG 24 hr tablet Commonly known as: DITROPAN-XL       TAKE these medications    acetaminophen  500 MG tablet Commonly known as: TYLENOL  Take 1,000 mg by mouth 2 (two) times daily as needed for moderate pain (pain score 4-6) or headache.   albuterol  108 (90 Base) MCG/ACT inhaler Commonly known as: VENTOLIN  HFA Inhale 1-2 puffs into the lungs every 6 (six) hours as needed for  shortness of breath or wheezing.   ascorbic acid 500 MG tablet Commonly known as: VITAMIN C Take 500 mg by mouth daily.   aspirin  EC 81 MG tablet Take 1 tablet (81 mg total) by mouth daily for 20 days. Swallow whole.   clopidogrel  75 MG tablet Commonly known as: PLAVIX  Take 1 tablet (75 mg total) by mouth daily.   DULoxetine  60 MG capsule Commonly known as: CYMBALTA  Take 60 mg by mouth at bedtime.  fluticasone  50 MCG/ACT nasal spray Commonly known as: FLONASE  Place 1 spray into the nose daily.   levocetirizine 5 MG tablet Commonly known as: XYZAL  Take 5 mg by mouth at bedtime.   levothyroxine  88 MCG tablet Commonly known as: SYNTHROID  Take 88 mcg by mouth daily before breakfast.   losartan  25 MG tablet Commonly known as: COZAAR  Take 25 mg by mouth at bedtime.   meclizine  25 MG tablet Commonly known as: ANTIVERT  Take 1 tablet (25 mg total) by mouth 3 (three) times daily as needed for dizziness.   metFORMIN  500 MG tablet Commonly known as: GLUCOPHAGE  Take 1,000 mg by mouth 2 (two) times daily after a meal.   omeprazole 40 MG capsule Commonly known as: PRILOSEC Take 40 mg by mouth at bedtime.   PreserVision AREDS 2 Caps Take 1 capsule by mouth daily at 12 noon.   Repatha 140 MG/ML Sosy Generic drug: Evolocumab Inject 140 mg into the skin See admin instructions. Inject 1 dose (140mg ) subcutaneously every 14 days on every other Tuesday.   VITAMIN B-12 PO Take 1 tablet by mouth daily.   VITAMIN D-3 PO Take 1 capsule by mouth daily.               Durable Medical Equipment  (From admission, onward)           Start     Ordered   11/28/23 1359  For home use only DME 3 n 1  Once        11/28/23 1358             Follow-up Information     Husain, Karrar, MD. Schedule an appointment as soon as possible for a visit in 1 week(s).   Specialty: Internal Medicine Contact information: [address] [place]  [address] [phone]         [facility]. Schedule an appointment as soon as possible for a visit in 2 week(s).   Contact information: [address]     [address] [place]  [address] [phone]        Mealor, Eulas BRAVO, MD. Schedule an appointment as soon as possible for a visit in 1 week(s).   Specialty: Cardiology Why: Afib-CVA Contact information: [address] [place] [address] [phone]                 Major procedures and Radiology Reports - PLEASE review detailed and final reports thoroughly  -      MR BRAIN WO CONTRAST Result Date: 11/26/2023 CLINICAL DATA:  Neuro deficit, acute, stroke suspected EXAM: MRI HEAD WITHOUT CONTRAST TECHNIQUE: Multiplanar, multiecho pulse sequences of the brain and surrounding structures were obtained without intravenous contrast. COMPARISON:  MRI head Sep 10, 2023 FINDINGS: Brain: Punctate acute infarct in the right cerebellum (series five, image sixty one). No acute hemorrhage, hydrocephalus, extra-axial collection or mass lesion. Multiple small remote lacunar infarcts in the frontal and parietal white matter bilaterally. Patchy T2/FLAIR hyperintensities, compatible with chronic microvascular ischemic change. Vascular: Normal flow voids. Skull and upper cervical spine: Normal marrow signal. Sinuses/Orbits: Negative. IMPRESSION: Punctate acute infarct in the right cerebellum. Electronically Signed   By: Gilmore GORMAN Molt M.D.   On: 11/26/2023 03:24    Micro Results    No results found for this or any previous visit (from the past 240 hours).  Today   Subjective    Annette Gross today has no headache,no chest abdominal pain,no new weakness tingling or numbness, feels much  better wants to go home today.    Objective   Blood pressure (!) 171/86, pulse 87, temperature 97.9 F (36.6 C), temperature source Oral, resp. rate 14, height 4' 11.5 (1.511 m), weight 72.3 kg, SpO2  92%.   Intake/Output Summary (Last 24 hours) at 11/29/2023 0751 Last data filed at 11/29/2023 0608 Gross per 24 hour  Intake 542.9 ml  Output 1800 ml  Net -1257.1 ml    Exam  Awake Alert, No new F.N deficits,    [place].AT,PERRAL Supple Neck,   Symmetrical Chest wall movement, Good air movement bilaterally, CTAB RRR,No Gallops,   +ve B.Sounds, Abd Soft, Non tender,  No Cyanosis, Clubbing or edema    Data Review   Recent Labs  Lab 11/26/23 0141  WBC 9.9  HGB 13.7  HCT 41.1  PLT 408*  MCV 89.9  MCH 30.0  MCHC 33.3  RDW 13.5    Recent Labs  Lab 11/26/23 0141 11/27/23 0527  NA 132* 134*  K 4.4 4.1  CL 97* 99  CO2 24 27  ANIONGAP 11 8  GLUCOSE 150* 141*  BUN 14 12  CREATININE 0.74 0.77  AST 18  --   ALT 8  --   ALKPHOS 92  --   BILITOT 0.8  --   ALBUMIN 3.2*  --   CALCIUM  8.9 8.8*   Lab Results  Component Value Date   HGBA1C 6.6 (H) 09/11/2023     Total Time in preparing paper work, data evaluation and todays exam - 35 minutes  Signature  -    Lavada Stank M.D on 11/29/2023 at 7:51 AM   -  To page go to [email or web address]

## 2023-11-29 NOTE — Discharge Instructions (Signed)
 Follow with Primary MD Ransom Other, MD in 7 days   Get CBC, CMP, Magnesium   -  checked next visit with your primary MD   Activity: As tolerated with Full fall precautions use walker/cane & assistance as needed  Disposition Home    Diet: Heart Healthy Low Carb  Special Instructions: If you have smoked or chewed Tobacco  in the last 2 yrs please stop smoking, stop any regular Alcohol  and or any Recreational drug use.  On your next visit with your primary care physician please Get Medicines reviewed and adjusted.  Please request your Prim.MD to go over all Hospital Tests and Procedure/Radiological results at the follow up, please get all Hospital records sent to your Prim MD by signing hospital release before you go home.  If you experience worsening of your admission symptoms, develop shortness of breath, life threatening emergency, suicidal or homicidal thoughts you must seek medical attention immediately by calling 911 or calling your MD immediately  if symptoms less severe.  You Must read complete instructions/literature along with all the possible adverse reactions/side effects for all the Medicines you take and that have been prescribed to you. Take any new Medicines after you have completely understood and accpet all the possible adverse reactions/side effects.   Do not drive when taking Pain medications.  Do not take more than prescribed Pain, Sleep and Anxiety Medications  Wear Seat belts while driving.

## 2023-11-29 NOTE — Progress Notes (Signed)
 Reviewed AVS, patient expressed understanding of medications, MD follow up reviewed.   Patient states all belongings brought to the hospital at time of admission are accounted for and packed to take home.  Picked up medications from [facility] pharmacy. Pt transported to entrance A where family member was waiting in vehicle to transport home.

## 2023-11-29 NOTE — Plan of Care (Signed)

## 2023-11-30 ENCOUNTER — Ambulatory Visit: Attending: Internal Medicine

## 2023-11-30 ENCOUNTER — Telehealth: Payer: Self-pay

## 2023-11-30 DIAGNOSIS — I639 Cerebral infarction, unspecified: Secondary | ICD-10-CM

## 2023-11-30 LAB — CUP PACEART INCLINIC DEVICE CHECK
Date Time Interrogation Session: 20250729113151
Implantable Pulse Generator Implant Date: 20250512

## 2023-11-30 NOTE — Patient Instructions (Signed)
 Continue monthly remote monitoring.

## 2023-11-30 NOTE — Telephone Encounter (Signed)
 Pt came in today to be checked and we have given her a monitor instead of her doing the app on her phone. Pt will go home and plug in monitor and when she does remotes will be scheduled

## 2023-11-30 NOTE — Progress Notes (Signed)
 Pt seen in device clinic to reestablish remote monitoring.  Pt given bedside monitor.    Will schedule monthly remote checks.

## 2023-11-30 NOTE — Telephone Encounter (Signed)
 LVM for pt to call DC back so we can get her in today for her ILR to be checked

## 2023-12-01 ENCOUNTER — Encounter: Payer: Self-pay | Admitting: Physical Therapy

## 2023-12-01 ENCOUNTER — Ambulatory Visit: Admitting: Physical Therapy

## 2023-12-01 DIAGNOSIS — R42 Dizziness and giddiness: Secondary | ICD-10-CM

## 2023-12-01 DIAGNOSIS — R41842 Visuospatial deficit: Secondary | ICD-10-CM | POA: Diagnosis present

## 2023-12-01 DIAGNOSIS — I69351 Hemiplegia and hemiparesis following cerebral infarction affecting right dominant side: Secondary | ICD-10-CM | POA: Diagnosis not present

## 2023-12-01 DIAGNOSIS — R262 Difficulty in walking, not elsewhere classified: Secondary | ICD-10-CM | POA: Diagnosis not present

## 2023-12-01 DIAGNOSIS — R4701 Aphasia: Secondary | ICD-10-CM | POA: Diagnosis not present

## 2023-12-01 DIAGNOSIS — R2681 Unsteadiness on feet: Secondary | ICD-10-CM

## 2023-12-01 DIAGNOSIS — M6281 Muscle weakness (generalized): Secondary | ICD-10-CM

## 2023-12-01 NOTE — Therapy (Signed)
 OUTPATIENT PHYSICAL THERAPY NEURO REASSESSMENT   Patient Name: Annette Gross MRN: [id number] DOB:11/11/1940, 83 y.o., female Today's Date: 12/01/2023   PCP: Alys Schuyler [place], [name] REFERRING PROVIDER: Gayland Lauraine PARAS, NP  END OF SESSION:  PT End of Session - 12/01/23 1526     Visit Number 2    Number of Visits 12    Date for PT Re-Evaluation 01/12/24    Authorization Type [employer]    Authorization Time Period auth required    Progress Note Due on Visit 10    PT Start Time 1530    PT Stop Time 1615    PT Time Calculation (min) 45 min    Behavior During Therapy WFL for tasks assessed/performed           Past Medical History:  Diagnosis Date   Anemia    many yrs ago   Anxiety    Asthma    no recent issues   Cancer (HCC)    s/p thyroidectomy 2018   Complication of anesthesia    Cystitis, interstitial    Degenerative disc disease, lumbar    Degenerative joint disease    spinal stenosis Chronic low back pain   Depression    Diabetes mellitus    diet control.   Diverticulitis 2005   Diverticulosis 2005   Factor V deficiency (HCC)    Fibromyalgia    Fracture of right foot 1995   GERD (gastroesophageal reflux disease)    History of hiatal hernia    Hyperlipidemia    Hypertension    off med x 5 yrs.   IBS (irritable bowel syndrome)    Insomnia    Iron deficiency    Liver hemangioma 1999   MRI   Mononucleosis    Morton's neuroma    Left foot   Nuclear sclerotic cataract of left eye 08/29/2019   Peripheral neuropathy    Pneumonia    years 89 & 89.  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   CHOLECYSTECTOMY     COLONOSCOPY WITH PROPOFOL  N/A 09/02/2015   Procedure: COLONOSCOPY WITH PROPOFOL ;   Surgeon: Gladis MARLA Louder, MD;  Location: [facility];  Service: Endoscopy;  Laterality: N/A;   GANGLION CYST EXCISION     right   HEMORRHOID SURGERY     HERNIA REPAIR     repair was aimed at the [facility]   HIATAL HERNIA REPAIR     and nissen fundoplication   LAPAROSCOPIC ESOPHAGOGASTRIC FUNDOPLASTY     LAPAROSCOPIC INCISIONAL / UMBILICAL / VENTRAL HERNIA REPAIR     umbilical hernia   LOOP RECORDER INSERTION N/A 09/13/2023   Procedure: LOOP RECORDER INSERTION;  Surgeon: Leverne Charlies Helling, PA-C;  Location: [facility];  Service: Cardiovascular;  Laterality: N/A;   OOPHORECTOMY     left   RIGHT OOPHORECTOMY     '05-laparaoscopic   SHOULDER SURGERY Right    THYROIDECTOMY N/A 07/06/2016   Procedure: TOTAL THYROIDECTOMY;  Surgeon: Krystal Russell, MD;  [facility];  Service: General;  Laterality: N/A;   TONSILLECTOMY     TOTAL THYROIDECTOMY  07/06/2016   TRANSESOPHAGEAL ECHOCARDIOGRAM (CATH LAB) N/A 09/13/2023   Procedure: TRANSESOPHAGEAL ECHOCARDIOGRAM;  Surgeon:  Mona Vinie BROCKS, MD;  Location: [facility];  Service: Cardiovascular;  Laterality: N/A;   Patient Active Problem List   Diagnosis Date Noted   Hypertension    CVA (cerebral vascular accident) (HCC) 09/10/2023   Stroke (cerebrum) (HCC) 09/10/2023   Pseudophakia, both eyes 10/13/2021   Dermatochalasis of both lower eyelids 10/13/2021   Moderate nonproliferative diabetic retinopathy of left eye (HCC) 10/10/2020   Moderate nonproliferative diabetic retinopathy of right eye (HCC) 08/29/2019   Early stage nonexudative age-related macular degeneration of both eyes 08/29/2019   Right posterior capsular opacification 08/29/2019   Left thyroid  nodule 07/06/2016   Benign neoplasm of skin of upper limb, including shoulder 04/19/2012   Ventral hernia, recurrent 01/26/2011    ONSET DATE: 09/10/23  REFERRING DIAG:  I63.9 (ICD-10-CM) - Acute CVA (cerebrovascular accident) (HCC)    THERAPY DIAG:  Difficulty in walking,  not elsewhere classified  Dizziness and giddiness  Muscle weakness (generalized)  Unsteadiness on feet  Hemiplegia and hemiparesis following cerebral infarction affecting right dominant side (HCC)  Rationale for Evaluation and Treatment: Rehabilitation  SUBJECTIVE:                                                                                                                                                                                             SUBJECTIVE STATEMENT: Pt reports she had another CVA. Pt admitted to hospital on 11/26/23 to 11/29/23 for a R cerebellar infarct. Reports meclizine  has been helping but dizziness has been worse. Vision has been the same. Balance is worse. Pt states her left side is a little weaker than it was but isn't sure if it's noticeable. Pt is left handed. Pt states she just can't get up and walk around without RW.   From eval: Pt with CVA in May 2025 with loss of vision to left eye and reports some improvement in vision in the mean time.  Reports no therapy services in the interim. Previously independent-modified independent with functional mobility.  Noting some ongoing balance issues and LLE weakness  Pt accompanied by: self  PERTINENT HISTORY: 83 y.o. year old female with stroke in May 2025.  Left eye central retinal artery occlusion and multiple small scattered ischemic infarcts.  Cardioembolic source.  Presented to her ophthalmologist with painless loss of vision to the left eye.  Loop recorder placed 09/13/23.  TEE negative.  Vascular risk factors: HTN, HLD. Incidental 8 mm meningioma overlying the anterior right frontal lobe  PAIN:  Are you having pain? No  PRECAUTIONS: None  RED FLAGS: None   WEIGHT BEARING RESTRICTIONS: No  FALLS: Has patient fallen in last 6  months? Yes. Number of falls 2 falls involving going down stairs; 1 recent fall on to grass 11/17/23  LIVING ENVIRONMENT: Lives with: lives with their son Lives in: House/apartment  ground floor set-up Stairs: 6 steps to enter Has following equipment at home: Single point cane and Environmental consultant - 4 wheeled  PLOF: Independent with household mobility with device and Independent with household mobility without device  PATIENT GOALS: improve balance  OBJECTIVE:    TODAY'S TREATMENT: 12/01/2023 Added gaze stabilization exercises to HEP (see below)  HOME EXERCISE PROGRAM Access Code: [email or web address] URL: [email or web address] Date: 12/01/2023 Prepared by: Teanna Elem April Earnie Starring  Exercises - Seated Heel Toe Raises  - 1 x daily - 7 x weekly - 3 sets - 10 reps - Seated Long Arc Quad  - 1 x daily - 7 x weekly - 3 sets - 10 reps - Seated Hip Adduction Isometrics with Ball  - 1 x daily - 7 x weekly - 3 sets - 10 reps - Staggered Sit-to-Stand  - 1 x daily - 7 x weekly - 3 sets - 5 reps - Seated Gaze Stabilization with Head Rotation  - 3-5 x daily - 7 x weekly - 20 sec hold - Seated Gaze Stabilization with Head Nod  - 3-5 x daily - 7 x weekly - 20 sec hold  PATIENT EDUCATION: Education details: Exam findings and comparison to initial PT evaluation, HEP updates Person educated: Patient Education method: Explanation and Handouts Education comprehension: verbalized understanding  --------------------------------------------  VESTIBULAR ASSESSMENT 12/01/23:  GENERAL OBSERVATION: n/a   SYMPTOM BEHAVIOR:  Subjective history: Increased dizziness after acute new CVA on 11/26/23  Non-Vestibular symptoms: nausea/vomiting  Type of dizziness: Imbalance (Disequilibrium), Unsteady with head/body turns, and It feels like I'm moving.  Frequency: Comes and goes, maybe 3-5x in the day  Duration: Can last a couple of hours  Aggravating factors: Induced by motion: turning body quickly and turning head quickly  Relieving factors: medication and rest  Progression of symptoms: worse  OCULOMOTOR EXAM:  Ocular Alignment: normal  Ocular ROM: No Limitations  Spontaneous Nystagmus:  absent  Gaze-Induced Nystagmus: absent  Smooth Pursuits: intact  Saccades: slow  FRENZEL - FIXATION SUPRESSED: Did not assess  VESTIBULAR - OCULAR REFLEX:   Slow VOR: Positive Left  VOR Cancellation: Normal  Head-Impulse Test: HIT Right: negative HIT Left: positive  Dynamic Visual Acuity: TBA   POSITIONAL TESTING: did not assess   MOTION SENSITIVITY: did not assess   OTHOSTATICS: not done  DIAGNOSTIC FINDINGS:   MR BRAIN WO CONTRAST Result Date: 11/26/2023 CLINICAL DATA:  Neuro deficit, acute, stroke suspected EXAM: MRI HEAD WITHOUT CONTRAST TECHNIQUE: Multiplanar, multiecho pulse sequences of the brain and surrounding structures were obtained without intravenous contrast. COMPARISON:  MRI head Sep 10, 2023 FINDINGS: Brain: Punctate acute infarct in the right cerebellum (series five, image sixty one). No acute hemorrhage, hydrocephalus, extra-axial collection or mass lesion. Multiple small remote lacunar infarcts in the frontal and parietal white matter bilaterally. Patchy T2/FLAIR hyperintensities, compatible with chronic microvascular ischemic change. Vascular: Normal flow voids. Skull and upper cervical spine: Normal marrow signal. Sinuses/Orbits: Negative. IMPRESSION: Punctate acute infarct in the right cerebellum. Electronically Signed   By: Gilmore GORMAN Molt M.D.   On: 11/26/2023 03:24  09/10/23 MRI multiple small acute infarcts scattered within bilateral cerebral hemispheres, likely embolic in nature, 8 mm meningioma overlying anterior right frontal lobe, background atrophy and chronic small vessel ischemic disease - MRA no LVO or significant stenosis - Carotid Doppler unremarkable - Bilateral lower  extremity ultrasound negative - 2D echo EF 60 to 65% - Loop recorder placed - TEE EF 60 to 65%, negative for PFO - LDL 30 - A1c 6.6  FROM INITIAL EVALUATION 11/17/23 (UNLESS OTHERWISE NOTED)  COGNITION: Overall cognitive status: Within functional limits for tasks  assessed   SENSATION: WFL  COORDINATION: WFL Slight dysmetria finger to nose LUE  EDEMA:  None noted  MUSCLE TONE: NT  MUSCLE LENGTH: WFL  DTRs:  NT  POSTURE: scoliosis w/ pelvic obliquity   LOWER EXTREMITY ROM:    WFL   LOWER EXTREMITY MMT:    MMT Right Eval Left Eval R/L 12/01/23  Hip flexion 5 4 3+/3+  Hip extension   3+/3+  Hip abduction 4 3- 3/3-  Hip adduction 4 4   Hip internal rotation     Hip external rotation     Knee flexion 5 4 5/4  Knee extension 5 4 5/4  Ankle dorsiflexion 5 5   Ankle plantarflexion     Ankle inversion     Ankle eversion     (Blank rows = not tested)  BED MOBILITY:  Not tested reports indep  TRANSFERS: Sit to stand: Complete Independence  Assistive device utilized: None     Chair to chair: Modified independence  Assistive device utilized: UE support       RAMP:  Not tested  CURB:  Findings: SBA-CGA  STAIRS: Findings: Comments: Supervision w/ BHR GAIT: Findings: Gait Characteristics: trendelenburg and Comments: left Modified indep level surfaces, CGA uneven  FUNCTIONAL TESTS:   11/17/23 5 times sit to stand: 18.78 sec Timed up and go (TUG): 19 sec w/ cane 10 meter walk test: 20 sec w/ cane = 1.64 ft/sec Berg Balance Test:42/56  M-CTSIB  Condition 1: Firm Surface, EO 30 Sec, Moderate Sway  Condition 2: Firm Surface, EC 6 Sec, Severe Sway  Condition 3: Foam Surface, EO  Sec,  Sway  Condition 4: Foam Surface, EC  Sec,  Sway    12/01/23 5 times sit to stand: 27.47 sec (R hand on knee, uses legs against bed) 10 meter walk test: 17.03 sec with  Lars Balance Test: 27/56 TUG: Deferred today due to pt L hip fatigued and starting to hurt after functional testing MCTSIB: Deferred today due to pt L hip pain    [facility] PT Assessment - 12/01/23 0001       Berg Balance Test   Sit to Stand Able to stand  independently using hands    Standing Unsupported Able to stand 2 minutes with supervision    Sitting with Back  Unsupported but Feet Supported on Floor or Stool Able to sit safely and securely 2 minutes    Stand to Sit Controls descent by using hands    Transfers Able to transfer safely, definite need of hands    Standing Unsupported with Eyes Closed Able to stand 3 seconds    Standing Unsupported with Feet Together Able to place feet together independently and stand for 1 minute with supervision    From Standing, Reach Forward with Outstretched Arm Can reach forward >5 cm safely (2)    From Standing Position, Pick up Object from Floor Unable to try/needs assist to keep balance   Gets too dizzy   From Standing Position, Turn to Look Behind Over each Shoulder Needs supervision when turning    Turn 360 Degrees Needs assistance while turning   Limited by dizziness   Standing Unsupported, Alternately Place Feet on Step/Stool Needs assistance to keep  from falling or unable to try    Standing Unsupported, One Foot in Front Able to take small step independently and hold 30 seconds    Standing on One Leg Tries to lift leg/unable to hold 3 seconds but remains standing independently    Total Score 27                                                                                                                                      GOALS: Goals reviewed with patient? Yes  SHORT TERM GOALS: Target date: 12/08/2023    Patient will be independent in HEP to improve functional outcomes Baseline: Goal status: IN PROGRESS  2.  Demo improved postural stability per mild-moderate sway x 30 sec condition 2 M-CTSIB to improve safety with ADL Baseline: severe x 6 sec Goal status: IN PROGRESS  3.  Demo improved BLE strength and reduce risk for falls per time 15 sec 5xSTS test Baseline: 18 sec Goal status: IN PROGRESS    LONG TERM GOALS: Target date: 01/12/2024    Demo improved left hip abduction strength to 3+/5 for improved single limb support and to minimize gait dysfunction Baseline: 3-/5,  Trendelenberg Goal status: IN PROGRESS  2.  Modified independent stair ambulation for improved safety with entering/exiting home Baseline: supervision BHR Goal status: IN PROGRESS  3.  Reduce risk for falls per score 48/56 Berg Balance Test Baseline 11/17/23: 42/56 12/01/23: 21/56 Goal status: IN PROGRESS  4.  Reduce risk for falls per time 14 sec TUG test Baseline: 19 sec w/ cane Goal status: IN PROGRESS  5.  Modified independent with uneven surfaces and curb negotiation to improve safety in community Baseline: CGA Goal status: IN PROGRESS    ASSESSMENT:  CLINICAL IMPRESSION: Pt presents today s/p a new R cerebellar CVA. Pt admitted to hospital 11/26/23 to 11/29/23. Upon d/c pt has worsened balance, dizziness, and increased weakness affecting her overall mobility and placing her at very high risk of falls. Skilled PT session focused on reassessment. Discussed need to increase PT to likely 2x/wk instead of 1x. Initiated some VOR exercises today as pt had difficulty maintaining gaze stability. Pt will continue to benefit from skilled PT towards goals for improved functional mobility and decreased fall risk. Unable to fully complete functional tests due to increased L hip pain and fatigue.   From eval: Patient is a 83 y.o. lady who was seen today for physical therapy evaluation and treatment for hx of CVA.  Presents with deficits and limitations affecting functional mobility and high risk for falls per several outcome measures.  LLE weakness and gait deviations present with impact to balance as ambulates w/ left Trendelenberg and deficits when turning from lack of single limb support.  CGA needed for curb and uneven surface ambulation and supervision for stair negotiation w/ BHR.  Pt would benefit from PT services to address deifcits and limitations to reduce risk for  falls and facilitate return to PLOF   OBJECTIVE IMPAIRMENTS: Abnormal gait, decreased activity tolerance, decreased balance,  difficulty walking, decreased strength, and postural dysfunction.   ACTIVITY LIMITATIONS: carrying, lifting, standing, stairs, transfers, reach over head, and locomotion level  PARTICIPATION LIMITATIONS: meal prep, cleaning, laundry, interpersonal relationship, driving, shopping, and community activity  PERSONAL FACTORS: Age, Time since onset of injury/illness/exacerbation, and 1-2 comorbidities: PMH are also affecting patient's functional outcome.   REHAB POTENTIAL: Good  CLINICAL DECISION MAKING: Evolving/moderate complexity  EVALUATION COMPLEXITY: Moderate  PLAN:  PT FREQUENCY: 1-2x/week  PT DURATION: 6 weeks  PLANNED INTERVENTIONS: 97164- PT Re-evaluation, 97750- Physical Performance Testing, 97110-Therapeutic exercises, 97530- Therapeutic activity, [id number]- Neuromuscular re-education, 97535- Self Care, [id number]- Manual therapy, [id number]- Gait training, [phone]- Orthotic Initial, [phone]- Canalith repositioning, [id number]- Aquatic Therapy, [phone]- Electrical stimulation (unattended), [phone] (1-2 muscles), [id number] (3+ muscles)- Dry Needling, Patient/Family education, Balance training, Stair training, Taping, Joint mobilization, Spinal mobilization, Vestibular training, Cryotherapy, and Moist heat  PLAN FOR NEXT SESSION: Finish functional tests. HEP review, vestibular training, strengthening, balance   Nechemia Chiappetta April Ma L Vandemere, [place], DPT 12/01/23 4:28 PM Phone: [phone] Fax: [phone]  [facility] at [facility] [address] [place], [place] [address] Phone # [phone] Fax # [phone]

## 2023-12-02 DIAGNOSIS — E782 Mixed hyperlipidemia: Secondary | ICD-10-CM | POA: Diagnosis not present

## 2023-12-02 DIAGNOSIS — E890 Postprocedural hypothyroidism: Secondary | ICD-10-CM | POA: Diagnosis not present

## 2023-12-02 DIAGNOSIS — J4530 Mild persistent asthma, uncomplicated: Secondary | ICD-10-CM | POA: Diagnosis not present

## 2023-12-02 DIAGNOSIS — M810 Age-related osteoporosis without current pathological fracture: Secondary | ICD-10-CM | POA: Diagnosis not present

## 2023-12-06 DIAGNOSIS — Z8673 Personal history of transient ischemic attack (TIA), and cerebral infarction without residual deficits: Secondary | ICD-10-CM | POA: Diagnosis not present

## 2023-12-06 DIAGNOSIS — I10 Essential (primary) hypertension: Secondary | ICD-10-CM | POA: Diagnosis not present

## 2023-12-08 ENCOUNTER — Ambulatory Visit: Attending: Neurology

## 2023-12-08 ENCOUNTER — Ambulatory Visit

## 2023-12-08 DIAGNOSIS — R42 Dizziness and giddiness: Secondary | ICD-10-CM | POA: Insufficient documentation

## 2023-12-08 DIAGNOSIS — R2681 Unsteadiness on feet: Secondary | ICD-10-CM | POA: Insufficient documentation

## 2023-12-08 DIAGNOSIS — M6281 Muscle weakness (generalized): Secondary | ICD-10-CM | POA: Insufficient documentation

## 2023-12-08 DIAGNOSIS — R41842 Visuospatial deficit: Secondary | ICD-10-CM | POA: Insufficient documentation

## 2023-12-08 DIAGNOSIS — R4701 Aphasia: Secondary | ICD-10-CM | POA: Insufficient documentation

## 2023-12-08 DIAGNOSIS — R262 Difficulty in walking, not elsewhere classified: Secondary | ICD-10-CM | POA: Insufficient documentation

## 2023-12-08 DIAGNOSIS — I69351 Hemiplegia and hemiparesis following cerebral infarction affecting right dominant side: Secondary | ICD-10-CM | POA: Insufficient documentation

## 2023-12-09 DIAGNOSIS — Z9889 Other specified postprocedural states: Secondary | ICD-10-CM | POA: Diagnosis not present

## 2023-12-09 DIAGNOSIS — H3522 Other non-diabetic proliferative retinopathy, left eye: Secondary | ICD-10-CM | POA: Diagnosis not present

## 2023-12-09 DIAGNOSIS — H353132 Nonexudative age-related macular degeneration, bilateral, intermediate dry stage: Secondary | ICD-10-CM | POA: Diagnosis not present

## 2023-12-09 DIAGNOSIS — E113393 Type 2 diabetes mellitus with moderate nonproliferative diabetic retinopathy without macular edema, bilateral: Secondary | ICD-10-CM | POA: Diagnosis not present

## 2023-12-09 DIAGNOSIS — I6529 Occlusion and stenosis of unspecified carotid artery: Secondary | ICD-10-CM | POA: Diagnosis not present

## 2023-12-10 ENCOUNTER — Encounter: Payer: Self-pay | Admitting: Physical Therapy

## 2023-12-10 ENCOUNTER — Ambulatory Visit: Admitting: Physical Therapy

## 2023-12-10 DIAGNOSIS — R4701 Aphasia: Secondary | ICD-10-CM | POA: Diagnosis not present

## 2023-12-10 DIAGNOSIS — R42 Dizziness and giddiness: Secondary | ICD-10-CM | POA: Diagnosis not present

## 2023-12-10 DIAGNOSIS — R262 Difficulty in walking, not elsewhere classified: Secondary | ICD-10-CM | POA: Diagnosis not present

## 2023-12-10 DIAGNOSIS — M6281 Muscle weakness (generalized): Secondary | ICD-10-CM | POA: Diagnosis not present

## 2023-12-10 DIAGNOSIS — R2681 Unsteadiness on feet: Secondary | ICD-10-CM

## 2023-12-10 DIAGNOSIS — R41842 Visuospatial deficit: Secondary | ICD-10-CM | POA: Diagnosis present

## 2023-12-10 DIAGNOSIS — I69351 Hemiplegia and hemiparesis following cerebral infarction affecting right dominant side: Secondary | ICD-10-CM

## 2023-12-10 NOTE — Therapy (Signed)
 OUTPATIENT PHYSICAL THERAPY TREATMENT   Patient Name: Annette Gross MRN: [id number] DOB:[birth year], 83 y.o., female Today's Date: 12/10/2023   PCP: Alys Schuyler HERO, PA REFERRING PROVIDER: Gayland Lauraine PARAS, NP  END OF SESSION:  PT End of Session - 12/10/23 1015     Visit Number 3    Number of Visits 12    Date for PT Re-Evaluation 01/12/24    Authorization Type [employer]    Authorization Time Period Auth#: [id number] approved 12 PT visits from 11/17/2023-12/29/2023    Authorization - Visit Number 2    Authorization - Number of Visits 12    Progress Note Due on Visit 10    PT Start Time 1015    PT Stop Time 1055    PT Time Calculation (min) 40 min    Behavior During Therapy [facility] for tasks assessed/performed            Past Medical History:  Diagnosis Date   Anemia    many yrs ago   Anxiety    Asthma    no recent issues   Cancer (HCC)    s/p thyroidectomy 2018   Complication of anesthesia    Cystitis, interstitial    Degenerative disc disease, lumbar    Degenerative joint disease    spinal stenosis Chronic low back pain   Depression    Diabetes mellitus    diet control.   Diverticulitis 2005   Diverticulosis 2005   Factor V deficiency (HCC)    Fibromyalgia    Fracture of right foot 1995   GERD (gastroesophageal reflux disease)    History of hiatal hernia    Hyperlipidemia    Hypertension    off med x 5 yrs.   IBS (irritable bowel syndrome)    Insomnia    Iron deficiency    Liver hemangioma 1999   MRI   Mononucleosis    Morton's neuroma    Left foot   Nuclear sclerotic cataract of left eye 08/29/2019   Peripheral neuropathy    Pneumonia    years 89 & 89.  None since   PONV (postoperative nausea and vomiting)    nausea no vomiting   Rectocele    Transfusion history    child x2, many yrs ago after childbirthhemorrhage   Umbilical hernia 2012   Past Surgical History:  Procedure Laterality Date   blood vessel tumor removal  1980    from chin   CHOLECYSTECTOMY     COLONOSCOPY WITH PROPOFOL  N/A 09/02/2015   Procedure: COLONOSCOPY WITH PROPOFOL ;  Surgeon: Gladis MARLA Louder, MD;  Location: [facility];  Service: Endoscopy;  Laterality: N/A;   GANGLION CYST EXCISION     right   HEMORRHOID SURGERY     HERNIA REPAIR     repair was aimed at the [facility]   HIATAL HERNIA REPAIR     and nissen fundoplication   LAPAROSCOPIC ESOPHAGOGASTRIC FUNDOPLASTY     LAPAROSCOPIC INCISIONAL / UMBILICAL / VENTRAL HERNIA REPAIR     umbilical hernia   LOOP RECORDER INSERTION N/A 09/13/2023   Procedure: LOOP RECORDER INSERTION;  Surgeon: Leverne Charlies Helling, PA-C;  Location: [facility];  Service: Cardiovascular;  Laterality: N/A;   OOPHORECTOMY     left   RIGHT OOPHORECTOMY     '05-laparaoscopic   SHOULDER SURGERY Right    THYROIDECTOMY N/A 07/06/2016   Procedure: TOTAL THYROIDECTOMY;  Surgeon: Krystal Russell, MD;  Location: [facility];  Service: General;  Laterality: N/A;   TONSILLECTOMY  TOTAL THYROIDECTOMY  07/06/2016   TRANSESOPHAGEAL ECHOCARDIOGRAM (CATH LAB) N/A 09/13/2023   Procedure: TRANSESOPHAGEAL ECHOCARDIOGRAM;  Surgeon: Mona Vinie BROCKS, MD;  Location: [facility] CV [facility];  Service: Cardiovascular;  Laterality: N/A;   Patient Active Problem List   Diagnosis Date Noted   Hypertension    CVA (cerebral vascular accident) (HCC) 09/10/2023   Stroke (cerebrum) (HCC) 09/10/2023   Pseudophakia, both eyes 10/13/2021   Dermatochalasis of both lower eyelids 10/13/2021   Moderate nonproliferative diabetic retinopathy of left eye (HCC) 10/10/2020   Moderate nonproliferative diabetic retinopathy of right eye (HCC) 08/29/2019   Early stage nonexudative age-related macular degeneration of both eyes 08/29/2019   Right posterior capsular opacification 08/29/2019   Left thyroid  nodule 07/06/2016   Benign neoplasm of skin of upper limb, including shoulder 04/19/2012   Ventral hernia, recurrent 01/26/2011    ONSET DATE: 09/10/23  REFERRING  DIAG:  I63.9 (ICD-10-CM) - Acute CVA (cerebrovascular accident) (HCC)    THERAPY DIAG:  Difficulty in walking, not elsewhere classified  Dizziness and giddiness  Muscle weakness (generalized)  Unsteadiness on feet  Hemiplegia and hemiparesis following cerebral infarction affecting right dominant side (HCC)  Rationale for Evaluation and Treatment: Rehabilitation  SUBJECTIVE:                                                                                                                                                                                             SUBJECTIVE STATEMENT: Pt's daughter states pt has been having a good week. Had a laser treatment on the L eye. A little dizzy this morning but didn't take meclizine  because it makes her sleepy. 3/10 dizziness. Has been able to do her HEP -- still finds it hard to turn her head and keep her eyes on target.   From re-eval: Pt reports she had another CVA. Pt admitted to hospital on 11/26/23 to 11/29/23 for a R cerebellar infarct. Reports meclizine  has been helping but dizziness has been worse. Vision has been the same. Balance is worse. Pt states her left side is a little weaker than it was but isn't sure if it's noticeable. Pt is left handed. Pt states she just can't get up and walk around without RW.   From eval: Pt with CVA in May 2025 with loss of vision to left eye and reports some improvement in vision in the mean time.  Reports no therapy services in the interim. Previously independent-modified independent with functional mobility.  Noting some ongoing balance issues and LLE weakness  Pt accompanied by: self  PERTINENT HISTORY: 83 y.o. year old female with stroke in May 2025.  Left eye central  retinal artery occlusion and multiple small scattered ischemic infarcts.  Cardioembolic source.  Presented to her ophthalmologist with painless loss of vision to the left eye.  Loop recorder placed 09/13/23.  TEE negative.  Vascular risk  factors: HTN, HLD. Incidental 8 mm meningioma overlying the anterior right frontal lobe  PAIN:  Are you having pain? No  PRECAUTIONS: None  RED FLAGS: None   WEIGHT BEARING RESTRICTIONS: No  FALLS: Has patient fallen in last 6 months? Yes. Number of falls 2 falls involving going down stairs; 1 recent fall on to grass 11/17/23  LIVING ENVIRONMENT: Lives with: lives with their son Lives in: House/apartment ground floor set-up Stairs: 6 steps to enter Has following equipment at home: Single point cane and Environmental consultant - 4 wheeled  PLOF: Independent with household mobility with device and Independent with household mobility without device  PATIENT GOALS: improve balance  OBJECTIVE:    TODAY'S TREATMENT: 12/10/2023 Activity Comments  Nustep L5 x 8 min UEs/LEs   At counter: Heel/toe raise 3x10 Hip abd 2x10 Staggered stance rock fwd/bwd 2x10 UE support  Seated: LAQ with red TB 2x10 Smooth pursuit horizontal and vertical 2x30 each Saccades horizontal and vertical x 30 each VOR cancellation horizontal and vertial x30 each VOR x 1 horizontal and vertical 2x30   Minor deviations noted off target with under/overshooting       HOME EXERCISE PROGRAM Access Code: [email or web address] URL: [email or web address] Date: 12/01/2023 Prepared by: Severo Beber April Earnie Starring  Exercises - Seated Heel Toe Raises  - 1 x daily - 7 x weekly - 3 sets - 10 reps - Seated Long Arc Quad  - 1 x daily - 7 x weekly - 3 sets - 10 reps - Seated Hip Adduction Isometrics with Ball  - 1 x daily - 7 x weekly - 3 sets - 10 reps - Staggered Sit-to-Stand  - 1 x daily - 7 x weekly - 3 sets - 5 reps - Seated Gaze Stabilization with Head Rotation  - 3-5 x daily - 7 x weekly - 20 sec hold - Seated Gaze Stabilization with Head Nod  - 3-5 x daily - 7 x weekly - 20 sec hold  PATIENT EDUCATION: Education details: Exam findings and comparison to initial PT evaluation, HEP updates Person educated:  Patient Education method: Explanation and Handouts Education comprehension: verbalized understanding  --------------------------------------------  VESTIBULAR ASSESSMENT 12/01/23:  GENERAL OBSERVATION: n/a   SYMPTOM BEHAVIOR:  Subjective history: Increased dizziness after acute new CVA on 11/26/23  Non-Vestibular symptoms: nausea/vomiting  Type of dizziness: Imbalance (Disequilibrium), Unsteady with head/body turns, and It feels like I'm moving.  Frequency: Comes and goes, maybe 3-5x in the day  Duration: Can last a couple of hours  Aggravating factors: Induced by motion: turning body quickly and turning head quickly  Relieving factors: medication and rest  Progression of symptoms: worse  OCULOMOTOR EXAM:  Ocular Alignment: normal  Ocular ROM: No Limitations  Spontaneous Nystagmus: absent  Gaze-Induced Nystagmus: absent  Smooth Pursuits: intact  Saccades: slow  FRENZEL - FIXATION SUPRESSED: Did not assess  VESTIBULAR - OCULAR REFLEX:   Slow VOR: Positive Left  VOR Cancellation: Normal  Head-Impulse Test: HIT Right: negative HIT Left: positive  Dynamic Visual Acuity: TBA   POSITIONAL TESTING: did not assess   MOTION SENSITIVITY: did not assess   OTHOSTATICS: not done  DIAGNOSTIC FINDINGS:   MR BRAIN WO CONTRAST Result Date: 11/26/2023 CLINICAL DATA:  Neuro deficit, acute, stroke suspected EXAM: MRI HEAD WITHOUT CONTRAST TECHNIQUE: Multiplanar, multiecho pulse  sequences of the brain and surrounding structures were obtained without intravenous contrast. COMPARISON:  MRI head Sep 10, 2023 FINDINGS: Brain: Punctate acute infarct in the right cerebellum (series five, image sixty one). No acute hemorrhage, hydrocephalus, extra-axial collection or mass lesion. Multiple small remote lacunar infarcts in the frontal and parietal white matter bilaterally. Patchy T2/FLAIR hyperintensities, compatible with chronic microvascular ischemic change. Vascular: Normal flow voids. Skull and  upper cervical spine: Normal marrow signal. Sinuses/Orbits: Negative. IMPRESSION: Punctate acute infarct in the right cerebellum. Electronically Signed   By: Gilmore GORMAN Molt M.D.   On: 11/26/2023 03:24  09/10/23 MRI multiple small acute infarcts scattered within bilateral cerebral hemispheres, likely embolic in nature, 8 mm meningioma overlying anterior right frontal lobe, background atrophy and chronic small vessel ischemic disease - MRA no LVO or significant stenosis - Carotid Doppler unremarkable - Bilateral lower extremity ultrasound negative - 2D echo EF 60 to 65% - Loop recorder placed - TEE EF 60 to 65%, negative for PFO - LDL 30 - A1c 6.6  FROM INITIAL EVALUATION 11/17/23 (UNLESS OTHERWISE NOTED)  COGNITION: Overall cognitive status: Within functional limits for tasks assessed   SENSATION: WFL  COORDINATION: WFL Slight dysmetria finger to nose LUE  EDEMA:  None noted  MUSCLE TONE: NT  MUSCLE LENGTH: WFL  DTRs:  NT  POSTURE: scoliosis w/ pelvic obliquity   LOWER EXTREMITY ROM:    WFL   LOWER EXTREMITY MMT:    MMT Right Eval Left Eval R/L 12/01/23  Hip flexion 5 4 3+/3+  Hip extension   3+/3+  Hip abduction 4 3- 3/3-  Hip adduction 4 4   Hip internal rotation     Hip external rotation     Knee flexion 5 4 5/4  Knee extension 5 4 5/4  Ankle dorsiflexion 5 5   Ankle plantarflexion     Ankle inversion     Ankle eversion     (Blank rows = not tested)  BED MOBILITY:  Not tested reports indep  TRANSFERS: Sit to stand: Complete Independence  Assistive device utilized: None     Chair to chair: Modified independence  Assistive device utilized: UE support       RAMP:  Not tested  CURB:  Findings: SBA-CGA  STAIRS: Findings: Comments: Supervision w/ BHR GAIT: Findings: Gait Characteristics: trendelenburg and Comments: left Modified indep level surfaces, CGA uneven  FUNCTIONAL TESTS:   11/17/23 5 times sit to stand: 18.78 sec Timed up and go  (TUG): 19 sec w/ cane 10 meter walk test: 20 sec w/ cane = 1.64 ft/sec Berg Balance Test:42/56  M-CTSIB  Condition 1: Firm Surface, EO 30 Sec, Moderate Sway  Condition 2: Firm Surface, EC 6 Sec, Severe Sway  Condition 3: Foam Surface, EO  Sec,  Sway  Condition 4: Foam Surface, EC  Sec,  Sway    12/01/23 5 times sit to stand: 27.47 sec (R hand on knee, uses legs against bed) 10 meter walk test: 17.03 sec with  Lars Balance Test: 27/56 TUG: Deferred today due to pt L hip fatigued and starting to hurt after functional testing MCTSIB: Deferred today due to pt L hip pain  GOALS: Goals reviewed with patient? Yes  SHORT TERM GOALS: Target date: 12/08/2023    Patient will be independent in HEP to improve functional outcomes Baseline: Goal status: IN PROGRESS  2.  Demo improved postural stability per mild-moderate sway x 30 sec condition 2 M-CTSIB to improve safety with ADL Baseline: severe x 6 sec Goal status: IN PROGRESS  3.  Demo improved BLE strength and reduce risk for falls per time 15 sec 5xSTS test Baseline: 18 sec Goal status: IN PROGRESS    LONG TERM GOALS: Target date: 01/12/2024    Demo improved left hip abduction strength to 3+/5 for improved single limb support and to minimize gait dysfunction Baseline: 3-/5, Trendelenberg Goal status: IN PROGRESS  2.  Modified independent stair ambulation for improved safety with entering/exiting home Baseline: supervision BHR Goal status: IN PROGRESS  3.  Reduce risk for falls per score 48/56 Berg Balance Test Baseline 11/17/23: 42/56 12/01/23: 21/56 Goal status: IN PROGRESS  4.  Reduce risk for falls per time 14 sec TUG test Baseline: 19 sec w/ cane Goal status: IN PROGRESS  5.  Modified independent with uneven surfaces and curb negotiation to improve safety in community Baseline: CGA Goal  status: IN PROGRESS    ASSESSMENT:  CLINICAL IMPRESSION: Updated and progressed pt's HEP. Added more standing exercises to improve standing weight shift/weightbearing. Continued to work on VOR exercises. Will check functional tests next session.   From re-eval: Pt presents today s/p a new R cerebellar CVA. Pt admitted to hospital 11/26/23 to 11/29/23. Upon d/c pt has worsened balance, dizziness, and increased weakness affecting her overall mobility and placing her at very high risk of falls. Skilled PT session focused on reassessment. Discussed need to increase PT to likely 2x/wk instead of 1x. Initiated some VOR exercises today as pt had difficulty maintaining gaze stability. Pt will continue to benefit from skilled PT towards goals for improved functional mobility and decreased fall risk. Unable to fully complete functional tests due to increased L hip pain and fatigue.    OBJECTIVE IMPAIRMENTS: Abnormal gait, decreased activity tolerance, decreased balance, difficulty walking, decreased strength, and postural dysfunction.   ACTIVITY LIMITATIONS: carrying, lifting, standing, stairs, transfers, reach over head, and locomotion level  PARTICIPATION LIMITATIONS: meal prep, cleaning, laundry, interpersonal relationship, driving, shopping, and community activity  PERSONAL FACTORS: Age, Time since onset of injury/illness/exacerbation, and 1-2 comorbidities: PMH are also affecting patient's functional outcome.   REHAB POTENTIAL: Good  CLINICAL DECISION MAKING: Evolving/moderate complexity  EVALUATION COMPLEXITY: Moderate  PLAN:  PT FREQUENCY: 1-2x/week  PT DURATION: 6 weeks  PLANNED INTERVENTIONS: 97164- PT Re-evaluation, 97750- Physical Performance Testing, 97110-Therapeutic exercises, 97530- Therapeutic activity, [id number]- Neuromuscular re-education, 97535- Self Care, [id number]- Manual therapy, [id number]- Gait training, [phone]- Orthotic Initial, [phone]- Canalith repositioning, [id number]- Aquatic Therapy,  [phone]- Electrical stimulation (unattended), [phone] (1-2 muscles), [id number] (3+ muscles)- Dry Needling, Patient/Family education, Balance training, Stair training, Taping, Joint mobilization, Spinal mobilization, Vestibular training, Cryotherapy, and Moist heat  PLAN FOR NEXT SESSION: Finish functional tests. HEP review, vestibular training, strengthening, balance   Khiya Friese April Ma L [place], [place], DPT 12/10/23 10:15 AM Phone: [phone] Fax: [phone]  [facility] [address] [place], [place] [address] Phone # [phone] Fax # [phone]

## 2023-12-14 ENCOUNTER — Ambulatory Visit: Admitting: Physical Therapy

## 2023-12-14 DIAGNOSIS — M542 Cervicalgia: Secondary | ICD-10-CM | POA: Diagnosis not present

## 2023-12-14 DIAGNOSIS — M5090 Cervical disc disorder, unspecified, unspecified cervical region: Secondary | ICD-10-CM | POA: Diagnosis not present

## 2023-12-14 NOTE — Therapy (Deleted)
 OUTPATIENT PHYSICAL THERAPY TREATMENT   Patient Name: Annette Gross MRN: [id number] DOB:[birth year], 83 y.o., female Today's Date: 12/14/2023   PCP: Alys Schuyler HERO, PA REFERRING PROVIDER: Gayland Lauraine PARAS, NP  END OF SESSION:      Past Medical History:  Diagnosis Date   Anemia    many yrs ago   Anxiety    Asthma    no recent issues   Cancer [facility])    s/p thyroidectomy 2018   Complication of anesthesia    Cystitis, interstitial    Degenerative disc disease, lumbar    Degenerative joint disease    spinal stenosis Chronic low back pain   Depression    Diabetes mellitus    diet control.   Diverticulitis 2005   Diverticulosis 2005   Factor V deficiency (HCC)    Fibromyalgia    Fracture of right foot 1995   GERD (gastroesophageal reflux disease)    History of hiatal hernia    Hyperlipidemia    Hypertension    off med x 5 yrs.   IBS (irritable bowel syndrome)    Insomnia    Iron deficiency    Liver hemangioma 1999   MRI   Mononucleosis    Morton's neuroma    Left foot   Nuclear sclerotic cataract of left eye 08/29/2019   Peripheral neuropathy    Pneumonia    years 89 & 89.  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   CHOLECYSTECTOMY     COLONOSCOPY WITH PROPOFOL  N/A 09/02/2015   Procedure: COLONOSCOPY WITH PROPOFOL ;  Surgeon: Gladis MARLA Louder, MD;  Location: [facility];  Service: Endoscopy;  Laterality: N/A;   GANGLION CYST EXCISION     right   HEMORRHOID SURGERY     HERNIA REPAIR     repair was aimed at the [facility]   HIATAL HERNIA REPAIR     and nissen fundoplication   LAPAROSCOPIC ESOPHAGOGASTRIC FUNDOPLASTY     LAPAROSCOPIC INCISIONAL / UMBILICAL / VENTRAL HERNIA REPAIR     umbilical hernia   LOOP  RECORDER INSERTION N/A 09/13/2023   Procedure: LOOP RECORDER INSERTION;  Surgeon: Leverne Charlies Helling, PA-C;  Location: [facility];  Service: Cardiovascular;  Laterality: N/A;   OOPHORECTOMY     left   RIGHT OOPHORECTOMY     '05-laparaoscopic   SHOULDER SURGERY Right    THYROIDECTOMY N/A 07/06/2016   Procedure: TOTAL THYROIDECTOMY;  Surgeon: Krystal Russell, MD;  Location: [facility] OR;  Service: General;  Laterality: N/A;   TONSILLECTOMY     TOTAL THYROIDECTOMY  07/06/2016   TRANSESOPHAGEAL ECHOCARDIOGRAM (CATH LAB) N/A 09/13/2023   Procedure: TRANSESOPHAGEAL ECHOCARDIOGRAM;  Surgeon: Mona Vinie BROCKS, MD;  Location: [facility];  Service: Cardiovascular;  Laterality: N/A;   Patient Active Problem List   Diagnosis Date Noted   Hypertension    CVA (cerebral vascular accident) (HCC) 09/10/2023   Stroke (cerebrum) (HCC) 09/10/2023   Pseudophakia, both eyes 10/13/2021   Dermatochalasis of both lower eyelids 10/13/2021   Moderate nonproliferative diabetic retinopathy of left eye (HCC) 10/10/2020   Moderate nonproliferative diabetic retinopathy of right eye (HCC) 08/29/2019   Early stage nonexudative age-related macular degeneration of both eyes 08/29/2019  Right posterior capsular opacification 08/29/2019   Left thyroid  nodule 07/06/2016   Benign neoplasm of skin of upper limb, including shoulder 04/19/2012   Ventral hernia, recurrent 01/26/2011    ONSET DATE: 09/10/23  REFERRING DIAG:  I63.9 (ICD-10-CM) - Acute CVA (cerebrovascular accident) (HCC)    THERAPY DIAG:  No diagnosis found.  Rationale for Evaluation and Treatment: Rehabilitation  SUBJECTIVE:                                                                                                                                                                                             SUBJECTIVE STATEMENT: Pt's daughter states pt has been having a good week. Had a laser treatment on the L eye. A little dizzy this morning  but didn't take meclizine  because it makes her sleepy. 3/10 dizziness. Has been able to do her HEP -- still finds it hard to turn her head and keep her eyes on target.   From re-eval: Pt reports she had another CVA. Pt admitted to hospital on 11/26/23 to 11/29/23 for a R cerebellar infarct. Reports meclizine  has been helping but dizziness has been worse. Vision has been the same. Balance is worse. Pt states her left side is a little weaker than it was but isn't sure if it's noticeable. Pt is left handed. Pt states she just can't get up and walk around without RW.   From eval: Pt with CVA in May 2025 with loss of vision to left eye and reports some improvement in vision in the mean time.  Reports no therapy services in the interim. Previously independent-modified independent with functional mobility.  Noting some ongoing balance issues and LLE weakness  Pt accompanied by: self  PERTINENT HISTORY: 83 y.o. year old female with stroke in May 2025.  Left eye central retinal artery occlusion and multiple small scattered ischemic infarcts.  Cardioembolic source.  Presented to her ophthalmologist with painless loss of vision to the left eye.  Loop recorder placed 09/13/23.  TEE negative.  Vascular risk factors: HTN, HLD. Incidental 8 mm meningioma overlying the anterior right frontal lobe  PAIN:  Are you having pain? No  PRECAUTIONS: None  RED FLAGS: None   WEIGHT BEARING RESTRICTIONS: No  FALLS: Has patient fallen in last 6 months? Yes. Number of falls 2 falls involving going down stairs; 1 recent fall on to grass 11/17/23  LIVING ENVIRONMENT: Lives with: lives with their son Lives in: House/apartment ground floor set-up Stairs: 6 steps to enter Has following equipment at home: Single point cane and Walker - 4 wheeled  PLOF: Independent with household mobility with device and Independent with  household mobility without device  PATIENT GOALS: improve balance  OBJECTIVE:    TODAY'S TREATMENT:  12/14/2023 Activity Comments  Nustep L5 x 8 min UEs/LEs   At counter: Heel/toe raise 3x10 Hip abd 2x10 Staggered stance rock fwd/bwd 2x10 UE support  Seated: LAQ with red TB 2x10 Smooth pursuit horizontal and vertical 2x30 each Saccades horizontal and vertical x 30 each VOR cancellation horizontal and vertial x30 each VOR x 1 horizontal and vertical 2x30   Minor deviations noted off target with under/overshooting       HOME EXERCISE PROGRAM Access Code: [email or web address] URL: [email or web address] Date: 12/01/2023 Prepared by: Dangela How April Earnie Starring  Exercises - Seated Heel Toe Raises  - 1 x daily - 7 x weekly - 3 sets - 10 reps - Seated Long Arc Quad  - 1 x daily - 7 x weekly - 3 sets - 10 reps - Seated Hip Adduction Isometrics with Ball  - 1 x daily - 7 x weekly - 3 sets - 10 reps - Staggered Sit-to-Stand  - 1 x daily - 7 x weekly - 3 sets - 5 reps - Seated Gaze Stabilization with Head Rotation  - 3-5 x daily - 7 x weekly - 20 sec hold - Seated Gaze Stabilization with Head Nod  - 3-5 x daily - 7 x weekly - 20 sec hold  PATIENT EDUCATION: Education details: Exam findings and comparison to initial PT evaluation, HEP updates Person educated: Patient Education method: Explanation and Handouts Education comprehension: verbalized understanding  --------------------------------------------  VESTIBULAR ASSESSMENT 12/01/23:  GENERAL OBSERVATION: n/a   SYMPTOM BEHAVIOR:  Subjective history: Increased dizziness after acute new CVA on 11/26/23  Non-Vestibular symptoms: nausea/vomiting  Type of dizziness: Imbalance (Disequilibrium), Unsteady with head/body turns, and It feels like I'm moving.  Frequency: Comes and goes, maybe 3-5x in the day  Duration: Can last a couple of hours  Aggravating factors: Induced by motion: turning body quickly and turning head quickly  Relieving factors: medication and rest  Progression of symptoms: worse  OCULOMOTOR EXAM:  Ocular  Alignment: normal  Ocular ROM: No Limitations  Spontaneous Nystagmus: absent  Gaze-Induced Nystagmus: absent  Smooth Pursuits: intact  Saccades: slow  FRENZEL - FIXATION SUPRESSED: Did not assess  VESTIBULAR - OCULAR REFLEX:   Slow VOR: Positive Left  VOR Cancellation: Normal  Head-Impulse Test: HIT Right: negative HIT Left: positive  Dynamic Visual Acuity: TBA   POSITIONAL TESTING: did not assess   MOTION SENSITIVITY: did not assess   OTHOSTATICS: not done  DIAGNOSTIC FINDINGS:   MR BRAIN WO CONTRAST Result Date: 11/26/2023 CLINICAL DATA:  Neuro deficit, acute, stroke suspected EXAM: MRI HEAD WITHOUT CONTRAST TECHNIQUE: Multiplanar, multiecho pulse sequences of the brain and surrounding structures were obtained without intravenous contrast. COMPARISON:  MRI head Sep 10, 2023 FINDINGS: Brain: Punctate acute infarct in the right cerebellum (series five, image sixty one). No acute hemorrhage, hydrocephalus, extra-axial collection or mass lesion. Multiple small remote lacunar infarcts in the frontal and parietal white matter bilaterally. Patchy T2/FLAIR hyperintensities, compatible with chronic microvascular ischemic change. Vascular: Normal flow voids. Skull and upper cervical spine: Normal marrow signal. Sinuses/Orbits: Negative. IMPRESSION: Punctate acute infarct in the right cerebellum. Electronically Signed   By: Gilmore GORMAN Molt M.D.   On: 11/26/2023 03:24  09/10/23 MRI multiple small acute infarcts scattered within bilateral cerebral hemispheres, likely embolic in nature, 8 mm meningioma overlying anterior right frontal lobe, background atrophy and chronic small vessel ischemic disease - MRA no LVO or significant stenosis - Carotid  Doppler unremarkable - Bilateral lower extremity ultrasound negative - 2D echo EF 60 to 65% - Loop recorder placed - TEE EF 60 to 65%, negative for PFO - LDL 30 - A1c 6.6  FROM INITIAL EVALUATION 11/17/23 (UNLESS OTHERWISE  NOTED)  COGNITION: Overall cognitive status: Within functional limits for tasks assessed   SENSATION: WFL  COORDINATION: WFL Slight dysmetria finger to nose LUE  EDEMA:  None noted  MUSCLE TONE: NT  MUSCLE LENGTH: WFL  DTRs:  NT  POSTURE: scoliosis w/ pelvic obliquity   LOWER EXTREMITY ROM:    WFL   LOWER EXTREMITY MMT:    MMT Right Eval Left Eval R/L 12/01/23  Hip flexion 5 4 3+/3+  Hip extension   3+/3+  Hip abduction 4 3- 3/3-  Hip adduction 4 4   Hip internal rotation     Hip external rotation     Knee flexion 5 4 5/4  Knee extension 5 4 5/4  Ankle dorsiflexion 5 5   Ankle plantarflexion     Ankle inversion     Ankle eversion     (Blank rows = not tested)  BED MOBILITY:  Not tested reports indep  TRANSFERS: Sit to stand: Complete Independence  Assistive device utilized: None     Chair to chair: Modified independence  Assistive device utilized: UE support       RAMP:  Not tested  CURB:  Findings: SBA-CGA  STAIRS: Findings: Comments: Supervision w/ BHR GAIT: Findings: Gait Characteristics: trendelenburg and Comments: left Modified indep level surfaces, CGA uneven  FUNCTIONAL TESTS:   11/17/23 5 times sit to stand: 18.78 sec Timed up and go (TUG): 19 sec w/ cane 10 meter walk test: 20 sec w/ cane = 1.64 ft/sec Berg Balance Test:42/56  M-CTSIB  Condition 1: Firm Surface, EO 30 Sec, Moderate Sway  Condition 2: Firm Surface, EC 6 Sec, Severe Sway  Condition 3: Foam Surface, EO  Sec,  Sway  Condition 4: Foam Surface, EC  Sec,  Sway    12/01/23 5 times sit to stand: 27.47 sec (R hand on knee, uses legs against bed) 10 meter walk test: 17.03 sec with  Lars Balance Test: 27/56 TUG: Deferred today due to pt L hip fatigued and starting to hurt after functional testing MCTSIB: Deferred today due to pt L hip pain                                                                                                                                   GOALS: Goals reviewed with patient? Yes  SHORT TERM GOALS: Target date: 12/08/2023    Patient will be independent in HEP to improve functional outcomes Baseline: Goal status: IN PROGRESS  2.  Demo improved postural stability per mild-moderate sway x 30 sec condition 2 M-CTSIB to improve safety with ADL Baseline: severe x 6 sec Goal status: IN PROGRESS  3.  Demo improved BLE strength and reduce risk  for falls per time 15 sec 5xSTS test Baseline: 18 sec Goal status: IN PROGRESS    LONG TERM GOALS: Target date: 01/12/2024    Demo improved left hip abduction strength to 3+/5 for improved single limb support and to minimize gait dysfunction Baseline: 3-/5, Trendelenberg Goal status: IN PROGRESS  2.  Modified independent stair ambulation for improved safety with entering/exiting home Baseline: supervision BHR Goal status: IN PROGRESS  3.  Reduce risk for falls per score 48/56 Berg Balance Test Baseline 11/17/23: 42/56 12/01/23: 21/56 Goal status: IN PROGRESS  4.  Reduce risk for falls per time 14 sec TUG test Baseline: 19 sec w/ cane Goal status: IN PROGRESS  5.  Modified independent with uneven surfaces and curb negotiation to improve safety in community Baseline: CGA Goal status: IN PROGRESS    ASSESSMENT:  CLINICAL IMPRESSION: Updated and progressed pt's HEP. Added more standing exercises to improve standing weight shift/weightbearing. Continued to work on VOR exercises. Will check functional tests next session.   From re-eval: Pt presents today s/p a new R cerebellar CVA. Pt admitted to hospital 11/26/23 to 11/29/23. Upon d/c pt has worsened balance, dizziness, and increased weakness affecting her overall mobility and placing her at very high risk of falls. Skilled PT session focused on reassessment. Discussed need to increase PT to likely 2x/wk instead of 1x. Initiated some VOR exercises today as pt had difficulty maintaining gaze stability. Pt will continue to benefit  from skilled PT towards goals for improved functional mobility and decreased fall risk. Unable to fully complete functional tests due to increased L hip pain and fatigue.    OBJECTIVE IMPAIRMENTS: Abnormal gait, decreased activity tolerance, decreased balance, difficulty walking, decreased strength, and postural dysfunction.   ACTIVITY LIMITATIONS: carrying, lifting, standing, stairs, transfers, reach over head, and locomotion level  PARTICIPATION LIMITATIONS: meal prep, cleaning, laundry, interpersonal relationship, driving, shopping, and community activity  PERSONAL FACTORS: Age, Time since onset of injury/illness/exacerbation, and 1-2 comorbidities: PMH are also affecting patient's functional outcome.   REHAB POTENTIAL: Good  CLINICAL DECISION MAKING: Evolving/moderate complexity  EVALUATION COMPLEXITY: Moderate  PLAN:  PT FREQUENCY: 1-2x/week  PT DURATION: 6 weeks  PLANNED INTERVENTIONS: [id number]- PT Re-evaluation, 97750- Physical Performance Testing, 97110-Therapeutic exercises, 97530- Therapeutic activity, [id number]- Neuromuscular re-education, 97535- Self Care, [id number]- Manual therapy, [id number]- Gait training, [phone]- Orthotic Initial, [phone]- Canalith repositioning, [id number]- Aquatic Therapy, [phone]- Electrical stimulation (unattended), [phone] (1-2 muscles), [id number] (3+ muscles)- Dry Needling, Patient/Family education, Balance training, Stair training, Taping, Joint mobilization, Spinal mobilization, Vestibular training, Cryotherapy, and Moist heat  PLAN FOR NEXT SESSION: Finish functional tests. HEP review, vestibular training, strengthening, balance   Jaymere Alen April Honor LITTIE Starring, [place], DPT 12/14/23 12:41 PM Phone: [phone] Fax: [phone]  [facility] [address] [place], [place] [address] Phone # [phone] Fax # [phone]

## 2023-12-15 NOTE — Therapy (Signed)
 OUTPATIENT PHYSICAL THERAPY TREATMENT   Patient Name: Annette Gross MRN: [id number] DOB:[birth year], 83 y.o., female Today's Date: 12/20/2023   PCP: Alys Schuyler HERO, PA REFERRING PROVIDER: Gayland Lauraine PARAS, NP  END OF SESSION:  PT End of Session - 12/20/23 1526     Visit Number 5    Number of Visits 12    Date for PT Re-Evaluation 01/12/24    Authorization Type [employer]    Authorization Time Period Auth#: [id number] approved 12 PT visits from 11/17/2023-12/29/2023    Authorization - Visit Number 4    Authorization - Number of Visits 12    Progress Note Due on Visit 10    PT Start Time 1450    PT Stop Time 1530    PT Time Calculation (min) 40 min    Equipment Utilized During Treatment Gait belt    Activity Tolerance Patient tolerated treatment well    Behavior During Therapy WFL for tasks assessed/performed             Past Medical History:  Diagnosis Date   Anemia    many yrs ago   Anxiety    Asthma    no recent issues   Cancer (HCC)    s/p thyroidectomy 2018   Complication of anesthesia    Cystitis, interstitial    Degenerative disc disease, lumbar    Degenerative joint disease    spinal stenosis Chronic low back pain   Depression    Diabetes mellitus    diet control.   Diverticulitis 2005   Diverticulosis 2005   Factor V deficiency (HCC)    Fibromyalgia    Fracture of right foot 1995   GERD (gastroesophageal reflux disease)    History of hiatal hernia    Hyperlipidemia    Hypertension    off med x 5 yrs.   IBS (irritable bowel syndrome)    Insomnia    Iron deficiency    Liver hemangioma 1999   MRI   Mononucleosis    Morton's neuroma    Left foot   Nuclear sclerotic cataract of left eye 08/29/2019   Peripheral neuropathy    Pneumonia    years 89 & 89.  None since   PONV (postoperative nausea and vomiting)    nausea no vomiting   Rectocele    Transfusion history    child x2, many yrs ago after childbirthhemorrhage    Umbilical hernia 2012   Past Surgical History:  Procedure Laterality Date   blood vessel tumor removal  1980   from chin   CHOLECYSTECTOMY     COLONOSCOPY WITH PROPOFOL  N/A 09/02/2015   Procedure: COLONOSCOPY WITH PROPOFOL ;  Surgeon: Gladis MARLA Louder, MD;  Location: [facility];  Service: Endoscopy;  Laterality: N/A;   GANGLION CYST EXCISION     right   HEMORRHOID SURGERY     HERNIA REPAIR     repair was aimed at [facility]   HIATAL HERNIA REPAIR     and nissen fundoplication   LAPAROSCOPIC ESOPHAGOGASTRIC FUNDOPLASTY     LAPAROSCOPIC INCISIONAL / UMBILICAL / VENTRAL HERNIA REPAIR     umbilical hernia   LOOP RECORDER INSERTION N/A 09/13/2023   Procedure: LOOP RECORDER INSERTION;  Surgeon: Leverne Charlies Helling, PA-C;  Location: [facility];  Service: Cardiovascular;  Laterality: N/A;   OOPHORECTOMY     left   RIGHT OOPHORECTOMY     '05-laparaoscopic   SHOULDER SURGERY Right    THYROIDECTOMY N/A 07/06/2016   Procedure: TOTAL  THYROIDECTOMY;  Surgeon: Krystal Russell, MD;  Location: [facility] OR;  Service: General;  Laterality: N/A;   TONSILLECTOMY     TOTAL THYROIDECTOMY  07/06/2016   TRANSESOPHAGEAL ECHOCARDIOGRAM (CATH LAB) N/A 09/13/2023   Procedure: TRANSESOPHAGEAL ECHOCARDIOGRAM;  Surgeon: Mona Vinie BROCKS, MD;  Location: [facility];  Service: Cardiovascular;  Laterality: N/A;   Patient Active Problem List   Diagnosis Date Noted   Hypertension    CVA (cerebral vascular accident) (HCC) 09/10/2023   Stroke (cerebrum) (HCC) 09/10/2023   Pseudophakia, both eyes 10/13/2021   Dermatochalasis of both lower eyelids 10/13/2021   Moderate nonproliferative diabetic retinopathy of left eye (HCC) 10/10/2020   Moderate nonproliferative diabetic retinopathy of right eye (HCC) 08/29/2019   Early stage nonexudative age-related macular degeneration of both eyes 08/29/2019   Right posterior capsular opacification 08/29/2019   Left thyroid  nodule 07/06/2016   Benign neoplasm of skin of upper  limb, including shoulder 04/19/2012   Ventral hernia, recurrent 01/26/2011    ONSET DATE: 09/10/23  REFERRING DIAG:  I63.9 (ICD-10-CM) - Acute CVA (cerebrovascular accident) (HCC)    THERAPY DIAG:  Muscle weakness (generalized)  Difficulty in walking, not elsewhere classified  Dizziness and giddiness  Unsteadiness on feet  Rationale for Evaluation and Treatment: Rehabilitation  SUBJECTIVE:                                                                                                                                                                                             SUBJECTIVE STATEMENT: Doing fine. Nothing new since last session. Not dizzy now, but reports rolling in bed, getting up too quickly makes her dizzy.    Pt accompanied by: self  PERTINENT HISTORY: 83 y.o. year old female with stroke in May 2025.  Left eye central retinal artery occlusion and multiple small scattered ischemic infarcts.  Cardioembolic source.  Presented to her ophthalmologist with painless loss of vision to the left eye.  Loop recorder placed 09/13/23.  TEE negative.  Vascular risk factors: HTN, HLD. Incidental 8 mm meningioma overlying the anterior right frontal lobe  PAIN:  Are you having pain? No  PRECAUTIONS: None  RED FLAGS: None   WEIGHT BEARING RESTRICTIONS: No  FALLS: Has patient fallen in last 6 months? Yes. Number of falls 2 falls involving going down stairs; 1 recent fall on to grass 11/17/23  LIVING ENVIRONMENT: Lives with: lives with their son Lives in: House/apartment ground floor set-up Stairs: 6 steps to enter Has following equipment at home: Single point cane and Walker - 4 wheeled  PLOF: Independent with household mobility with device and Independent with household mobility without device  PATIENT GOALS: improve balance  OBJECTIVE:      TODAY'S TREATMENT: 12/20/23 Activity Comments  Standing EC with 4WW in front  No LOB, but safest with 2 fingertip support on  walker  TUG 21.38 sec with 4WW  review of HEP: - Seated Long Arc Quad   - Staggered Sit-to-Stand  - Heel Toe Raises with Counter Support - Standing Hip Abduction with Counter  - Staggered Stance Forward Backward Weight Shift with Counter Support  - Seated Horizontal Smooth Pursuit  - Seated Vertical Smooth Pursuit  - Seated VOR Cancellation   - Seated Horizontal Saccades  Review of HEP for carryover, benefit, and safety   Tendency for posterior LOB/retropulsion with STS, responding well to reaching towards visual target to improve form  Had to clarify smooth pursuit, VOR cancellation, and saccades- pt was not using a target and had difficulty maintaining pursuit and saccades horizontally.      M-CTSIB  Condition 1: Firm Surface, EO 30 Sec, Mild Sway  Condition 2: Firm Surface, EC 15 Sec, Moderate and LOB requiring min-mod A Sway  Condition 3: Foam Surface, EO - Sec, - Sway  Condition 4: Foam Surface, EC - Sec, - Sway     HOME EXERCISE PROGRAM Access Code: [email or web address] URL: [email or web address] Date: 12/20/2023 Prepared by: [facility] Outpatient  Rehab - [facility]  Program Notes perform standing exercises at corner or counter for safety  Exercises - Seated Long Arc Quad  - 1 x daily - 7 x weekly - 3 sets - 10 reps - Staggered Sit-to-Stand  - 1 x daily - 7 x weekly - 3 sets - 5 reps - Heel Toe Raises with Counter Support  - 1 x daily - 7 x weekly - 3 sets - 10 reps - Standing Hip Abduction with Counter Support  - 1 x daily - 7 x weekly - 3 sets - 10 reps - Staggered Stance Forward Backward Weight Shift with Counter Support  - 1 x daily - 7 x weekly - 3 sets - 10 reps - Seated Horizontal Smooth Pursuit  - 1 x daily - 7 x weekly - 2 sets - 30 sec hold - Seated Vertical Smooth Pursuit  - 1 x daily - 7 x weekly - 2 sets - 30 sec hold - Seated VOR Cancellation  - 1 x daily - 7 x weekly - 2 sets - 30 sec hold - Seated Horizontal Saccades  - 1 x daily - 7 x  weekly - 2 sets - 30 sec hold - Standing Balance in Corner with Eyes Closed  - 1 x daily - 5 x weekly - 2 sets - 30 sec hold    PATIENT EDUCATION: Education details: HEP update with detailed edu for safety; encouraged pt to use 4WW indoors/outdoors d/t falls risk as she reports walking without it in the house  Person educated: Patient Education method: Explanation, Demonstration, Tactile cues, Verbal cues, and Handouts Education comprehension: verbalized understanding and returned demonstration   --------------------------------------------  VESTIBULAR ASSESSMENT 12/01/23:  GENERAL OBSERVATION: n/a   SYMPTOM BEHAVIOR:  Subjective history: Increased dizziness after acute new CVA on 11/26/23  Non-Vestibular symptoms: nausea/vomiting  Type of dizziness: Imbalance (Disequilibrium), Unsteady with head/body turns, and It feels like I'm moving.  Frequency: Comes and goes, maybe 3-5x in the day  Duration: Can last a couple of hours  Aggravating factors: Induced by motion: turning body quickly and turning head quickly  Relieving factors: medication and rest  Progression of symptoms: worse  OCULOMOTOR EXAM:  Ocular Alignment: normal  Ocular ROM: No Limitations  Spontaneous Nystagmus: absent  Gaze-Induced Nystagmus: absent  Smooth Pursuits: intact  Saccades: slow  FRENZEL - FIXATION SUPRESSED: Did not assess  VESTIBULAR - OCULAR REFLEX:   Slow VOR: Positive Left  VOR Cancellation: Normal  Head-Impulse Test: HIT Right: negative HIT Left: positive  Dynamic Visual Acuity: TBA   POSITIONAL TESTING: did not assess   MOTION SENSITIVITY: did not assess   OTHOSTATICS: not done  DIAGNOSTIC FINDINGS:   MR BRAIN WO CONTRAST Result Date: 11/26/2023 CLINICAL DATA:  Neuro deficit, acute, stroke suspected EXAM: MRI HEAD WITHOUT CONTRAST TECHNIQUE: Multiplanar, multiecho pulse sequences of the brain and surrounding structures were obtained without intravenous contrast. COMPARISON:  MRI  head Sep 10, 2023 FINDINGS: Brain: Punctate acute infarct in the right cerebellum (series five, image sixty one). No acute hemorrhage, hydrocephalus, extra-axial collection or mass lesion. Multiple small remote lacunar infarcts in the frontal and parietal white matter bilaterally. Patchy T2/FLAIR hyperintensities, compatible with chronic microvascular ischemic change. Vascular: Normal flow voids. Skull and upper cervical spine: Normal marrow signal. Sinuses/Orbits: Negative. IMPRESSION: Punctate acute infarct in the right cerebellum. Electronically Signed   By: Gilmore GORMAN Molt M.D.   On: 11/26/2023 03:24  09/10/23 MRI multiple small acute infarcts scattered within bilateral cerebral hemispheres, likely embolic in nature, 8 mm meningioma overlying anterior right frontal lobe, background atrophy and chronic small vessel ischemic disease - MRA no LVO or significant stenosis - Carotid Doppler unremarkable - Bilateral lower extremity ultrasound negative - 2D echo EF 60 to 65% - Loop recorder placed - TEE EF 60 to 65%, negative for PFO - LDL 30 - A1c 6.6  FROM INITIAL EVALUATION 11/17/23 (UNLESS OTHERWISE NOTED)  COGNITION: Overall cognitive status: Within functional limits for tasks assessed   SENSATION: WFL  COORDINATION: WFL Slight dysmetria finger to nose LUE  EDEMA:  None noted  MUSCLE TONE: NT  MUSCLE LENGTH: WFL  DTRs:  NT  POSTURE: scoliosis w/ pelvic obliquity   LOWER EXTREMITY ROM:    WFL   LOWER EXTREMITY MMT:    MMT Right Eval Left Eval R/L 12/01/23  Hip flexion 5 4 3+/3+  Hip extension   3+/3+  Hip abduction 4 3- 3/3-  Hip adduction 4 4   Hip internal rotation     Hip external rotation     Knee flexion 5 4 5/4  Knee extension 5 4 5/4  Ankle dorsiflexion 5 5   Ankle plantarflexion     Ankle inversion     Ankle eversion     (Blank rows = not tested)  BED MOBILITY:  Not tested reports indep  TRANSFERS: Sit to stand: Complete Independence  Assistive  device utilized: None     Chair to chair: Modified independence  Assistive device utilized: UE support       RAMP:  Not tested  CURB:  Findings: SBA-CGA  STAIRS: Findings: Comments: Supervision w/ BHR GAIT: Findings: Gait Characteristics: trendelenburg and Comments: left Modified indep level surfaces, CGA uneven  FUNCTIONAL TESTS:   11/17/23 5 times sit to stand: 18.78 sec Timed up and go (TUG): 19 sec w/ cane 10 meter walk test: 20 sec w/ cane = 1.64 ft/sec Berg Balance Test:42/56  M-CTSIB  Condition 1: Firm Surface, EO 30 Sec, Moderate Sway  Condition 2: Firm Surface, EC 6 Sec, Severe Sway  Condition 3: Foam Surface, EO  Sec,  Sway  Condition 4: Foam Surface, EC  Sec,  Sway    12/01/23  5 times sit to stand: 27.47 sec (R hand on knee, uses legs against bed) 10 meter walk test: 17.03 sec with  Lars Balance Test: 27/56 TUG: Deferred today due to pt L hip fatigued and starting to hurt after functional testing MCTSIB: Deferred today due to pt L hip pain                                                                                                                                  GOALS: Goals reviewed with patient? Yes  SHORT TERM GOALS: Target date: 12/08/2023    Patient will be independent in HEP to improve functional outcomes Baseline: Goal status: IN PROGRESS  2.  Demo improved postural stability per mild-moderate sway x 30 sec condition 2 M-CTSIB to improve safety with ADL Baseline: severe x 6 sec; 15 sec mod sway/LOB requiring min-mod A 12/20/23 Goal status: IN PROGRESS 12/20/23  3.  Demo improved BLE strength and reduce risk for falls per time 15 sec 5xSTS test Baseline: 18 sec Goal status: IN PROGRESS    LONG TERM GOALS: Target date: 01/12/2024    Demo improved left hip abduction strength to 3+/5 for improved single limb support and to minimize gait dysfunction Baseline: 3-/5, Trendelenberg Goal status: IN PROGRESS  2.  Modified independent stair  ambulation for improved safety with entering/exiting home Baseline: supervision BHR Goal status: IN PROGRESS  3.  Reduce risk for falls per score 48/56 Berg Balance Test Baseline 11/17/23: 42/56 12/01/23: 21/56 Goal status: IN PROGRESS  4.  Reduce risk for falls per time 14 sec TUG test Baseline: 19 sec w/ cane; 21 sec with 4WW 12/20/23 Goal status: IN PROGRESS 12/20/23  5.  Modified independent with uneven surfaces and curb negotiation to improve safety in community Baseline: CGA Goal status: IN PROGRESS    ASSESSMENT:  CLINICAL IMPRESSION: Patient arrived to session without new complaints. Multisensory balance revealed significant instability and LOB with EC balance; did not complete entirety of testing for safety. Patient's gait speed according to TUG indicates an increased risk of falls. Encouraged pt to more consistently use her walker at home. Patient demonstrated difficulty completing oculomotor exercises from HEP, requiring cues to correct. May need another review next session.    OBJECTIVE IMPAIRMENTS: Abnormal gait, decreased activity tolerance, decreased balance, difficulty walking, decreased strength, and postural dysfunction.   ACTIVITY LIMITATIONS: carrying, lifting, standing, stairs, transfers, reach over head, and locomotion level  PARTICIPATION LIMITATIONS: meal prep, cleaning, laundry, interpersonal relationship, driving, shopping, and community activity  PERSONAL FACTORS: Age, Time since onset of injury/illness/exacerbation, and 1-2 comorbidities: PMH are also affecting patient's functional outcome.   REHAB POTENTIAL: Good  CLINICAL DECISION MAKING: Evolving/moderate complexity  EVALUATION COMPLEXITY: Moderate  PLAN:  PT FREQUENCY: 1-2x/week  PT DURATION: 6 weeks  PLANNED INTERVENTIONS: 97164- PT Re-evaluation, 97750- Physical Performance Testing, 97110-Therapeutic exercises, 97530- Therapeutic activity, [id number]- Neuromuscular re-education, 97535- Self Care,  97140- Manual therapy, [id number]- Gait training, [id number]-  Orthotic Initial, 04007- Canalith repositioning, [id number]- Aquatic Therapy, [phone]- Electrical stimulation (unattended), [phone] (1-2 muscles), 20561 (3+ muscles)- Dry Needling, Patient/Family education, Balance training, Stair training, Taping, Joint mobilization, Spinal mobilization, Vestibular training, Cryotherapy, and Moist heat  PLAN FOR NEXT SESSION: did not review STGs- need to check next session. Review oculomotor HEP, vestibular training, strengthening, balance   Louana Terrilyn Christians, [place], DPT 12/20/23 3:33 PM  [facility] at [facility] [address] [place], [address] [place], [place] [address] Phone # [phone] Fax # [phone]

## 2023-12-16 ENCOUNTER — Ambulatory Visit

## 2023-12-16 ENCOUNTER — Ambulatory Visit: Admitting: Occupational Therapy

## 2023-12-16 DIAGNOSIS — R42 Dizziness and giddiness: Secondary | ICD-10-CM | POA: Diagnosis not present

## 2023-12-16 DIAGNOSIS — R41842 Visuospatial deficit: Secondary | ICD-10-CM

## 2023-12-16 DIAGNOSIS — R262 Difficulty in walking, not elsewhere classified: Secondary | ICD-10-CM | POA: Diagnosis not present

## 2023-12-16 DIAGNOSIS — M6281 Muscle weakness (generalized): Secondary | ICD-10-CM

## 2023-12-16 DIAGNOSIS — I69351 Hemiplegia and hemiparesis following cerebral infarction affecting right dominant side: Secondary | ICD-10-CM | POA: Diagnosis not present

## 2023-12-16 DIAGNOSIS — R2681 Unsteadiness on feet: Secondary | ICD-10-CM

## 2023-12-16 DIAGNOSIS — R4701 Aphasia: Secondary | ICD-10-CM | POA: Diagnosis not present

## 2023-12-16 NOTE — Therapy (Signed)
 OUTPATIENT OCCUPATIONAL THERAPY NEURO  Treatment Note  Patient Name: Annette Gross MRN: [id number] DOB:[birth year], 83 y.o., female Today's Date: 12/16/2023  PCP: Dr. Sharie REFERRING PROVIDER: Gayland Lauraine PARAS, NP  END OF SESSION:    Past Medical History:  Diagnosis Date   Anemia    many yrs ago   Anxiety    Asthma    no recent issues   Cancer [facility])    s/p thyroidectomy 2018   Complication of anesthesia    Cystitis, interstitial    Degenerative disc disease, lumbar    Degenerative joint disease    spinal stenosis Chronic low back pain   Depression    Diabetes mellitus    diet control.   Diverticulitis 2005   Diverticulosis 2005   Factor V deficiency (HCC)    Fibromyalgia    Fracture of right foot 1995   GERD (gastroesophageal reflux disease)    History of hiatal hernia    Hyperlipidemia    Hypertension    off med x 5 yrs.   IBS (irritable bowel syndrome)    Insomnia    Iron deficiency    Liver hemangioma 1999   MRI   Mononucleosis    Morton's neuroma    Left foot   Nuclear sclerotic cataract of left eye 08/29/2019   Peripheral neuropathy    Pneumonia    years 89 & 89.  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   CHOLECYSTECTOMY     COLONOSCOPY WITH PROPOFOL  N/A 09/02/2015   Procedure: COLONOSCOPY WITH PROPOFOL ;  Surgeon: Gladis MARLA Louder, MD;  Location: [facility];  Service: Endoscopy;  Laterality: N/A;   GANGLION CYST EXCISION     right   HEMORRHOID SURGERY     HERNIA REPAIR     repair was aimed at the [facility]   HIATAL HERNIA REPAIR     and nissen fundoplication   LAPAROSCOPIC ESOPHAGOGASTRIC FUNDOPLASTY     LAPAROSCOPIC INCISIONAL / UMBILICAL / VENTRAL HERNIA REPAIR     umbilical hernia   LOOP RECORDER  INSERTION N/A 09/13/2023   Procedure: LOOP RECORDER INSERTION;  Surgeon: Leverne Charlies Helling, PA-C;  Location: [facility];  Service: Cardiovascular;  Laterality: N/A;   OOPHORECTOMY     left   RIGHT OOPHORECTOMY     '05-laparaoscopic   SHOULDER SURGERY Right    THYROIDECTOMY N/A 07/06/2016   Procedure: TOTAL THYROIDECTOMY;  Surgeon: Krystal Russell, MD;  [facility];  Service: General;  Laterality: N/A;   TONSILLECTOMY     TOTAL THYROIDECTOMY  07/06/2016   TRANSESOPHAGEAL ECHOCARDIOGRAM (CATH LAB) N/A 09/13/2023   Procedure: TRANSESOPHAGEAL ECHOCARDIOGRAM;  Surgeon: Mona Vinie BROCKS, MD;  Location: [facility] CV [facility];  Service: Cardiovascular;  Laterality: N/A;   Patient Active Problem List   Diagnosis Date Noted   Hypertension    CVA (cerebral vascular accident) (HCC) 09/10/2023   Stroke (cerebrum) (HCC) 09/10/2023   Pseudophakia, both eyes 10/13/2021   Dermatochalasis of both lower eyelids 10/13/2021   Moderate nonproliferative diabetic retinopathy of left eye (HCC) 10/10/2020   Moderate nonproliferative diabetic retinopathy of right eye (HCC) 08/29/2019   Early stage nonexudative age-related macular degeneration of both eyes 08/29/2019   Right posterior  capsular opacification 08/29/2019   Left thyroid  nodule 07/06/2016   Benign neoplasm of skin of upper limb, including shoulder 04/19/2012   Ventral hernia, recurrent 01/26/2011    ONSET DATE: 09/10/23  REFERRING DIAG: I63.9 (ICD-10-CM) - Acute CVA (cerebrovascular accident) (HCC)  THERAPY DIAG:  No diagnosis found.  Rationale for Evaluation and Treatment: Rehabilitation  SUBJECTIVE:   SUBJECTIVE STATEMENT: Pt reports in hospital 11/26/23 to 11/29/23 for a R cerebellar infarct.  Pt reports 2nd stroke has impacted her mobility more than the first one did.   Pt accompanied by: self (dropped off by friend)  PERTINENT HISTORY: Presented to the ER 09/10/2023 after waking with impaired vision to her left eye, she could only  see a crescent shaped area at the top of her field of vision. A right lower quadrantanopsia was also noted. She went through her ophthalmologist was told she had a central retinal artery occlusion and was sent to the ER. She was admitted for painless loss of vision to the central field of the left eye. On MRI found to have multiple embolic appearing ischemic strokes.  PMH of DM II, anxiety, cancer, DJD, DM, fibromyalgia, HTN, HLD, IBS  PRECAUTIONS: Fall  WEIGHT BEARING RESTRICTIONS: No  PAIN:  Are you having pain? Yes: NPRS scale: 2 Pain location: R knee and hip - from fall this morning Pain description: sore  FALLS: Has patient fallen in last 6 months? Yes. Number of falls 2 - one fall down the basement stairs and 1 this morning getting down the steps when walking to the car  LIVING ENVIRONMENT: Lives with: lives with their family (son and his partner) Lives in: House/apartment Stairs: Yes: Internal: there are stairs to the basement, but does not need to go down those steps; and External: 6 steps; bilateral but cannot reach both Has following equipment at home: Single point cane, Walker - 4 wheeled, and shower chair  PLOF: Independent, Independent with basic ADLs, and Leisure: enjoys drawing and painting  PATIENT GOALS: to be aware of vision, handwriting  OBJECTIVE:  Note: Objective measures were completed at Evaluation unless otherwise noted.  HAND DOMINANCE: Ambidextrous - writes with R hand, eats with L hand  ADLs: Transfers/ambulation related to ADLs: Uses Rollator in the home to transport items, SPC in the community Eating: reports decreased depth perception when pouring drink into cup, reports having to look at food when scooping Grooming: Mod I UB Dressing: Mod I - does have difficulty with bra but is able to complete without assist LB Dressing: Mod I Toileting: Mod I Bathing: Mod I  Tub Shower transfers: Mod I Equipment: Shower seat without back  IADLs: Light  housekeeping: helps with the dishes, emptying dishwasher, sweeping, folding and puttying away laundry Meal Prep: simple meal prep with sandwiches and cereal, is not primary cook now that she lives with family of 5 Community mobility: not cleared to resume driving s/p CVA Medication management: plans to get a pill box to organize all meds Handwriting: 100% legible and 17.5 sec for PPT #1 ([place]  MOBILITY STATUS: Hx of falls, uses Rollator in home occasionally and SPC in community  POSTURE COMMENTS:  Scoliosis with pelvic obliquity  ACTIVITY TOLERANCE: Activity tolerance: diminished  FUNCTIONAL OUTCOME MEASURES: 9 hole peg test: 9 Hole Peg test: Right: 34.72 sec; Left: 30.94 sec (pt asking about next step after filling peg board - ?difficulty with 2 step commands)  PSFS: TBD  UPPER EXTREMITY ROM:    Active ROM Right  eval Left eval  Shoulder flexion 142 112 ( reports she is awaiting surgery)  Shoulder abduction    Shoulder adduction    Shoulder extension    Shoulder internal rotation [facility]  Shoulder external rotation [facility]  Elbow flexion [facility] WFL  Elbow extension [facility] WFL  Wrist flexion [facility] WFL  Wrist extension [facility] WFL  Wrist ulnar deviation    Wrist radial deviation    Wrist pronation    Wrist supination    (Blank rows = not tested)  UPPER EXTREMITY MMT:     MMT Right eval Left eval  Shoulder flexion 4- 3+(reports awaiting surgery)  Shoulder abduction    Shoulder adduction    Shoulder extension    Shoulder internal rotation    Shoulder external rotation    Middle trapezius    Lower trapezius    Elbow flexion    Elbow extension    Wrist flexion    Wrist extension    Wrist ulnar deviation    Wrist radial deviation    Wrist pronation    Wrist supination    (Blank rows = not tested)  COORDINATION: Finger Nose Finger test: [facility] 9 Hole Peg test: Right: 34.72 sec; Left: 30.94 sec (pt asking about next step after filling peg board -  ?difficulty with 2 step commands) Box and Blocks:  Right 41 blocks, Left 43 blocks (hit barrier x3 with R, x2 with L)  12/16/23 9 hole peg test: Right: 31.56 sec; Left: 29.25   SENSATION: WFL   COGNITION: Overall cognitive status: Pt reports thinking skills are not quite the same (reports difficulty with crossword puzzles than before) and reports increased time to think through something.  VISION: Subjective report: vision is improving Baseline vision: Wears glasses for reading only Visual history: cataracts (s/p removal)  VISION ASSESSMENT: Impaired Ocular ROM: WFL Gaze preference/alignment: WDL Tracking/Visual pursuits: Able to track stimulus in all quads without difficulty Saccades: additional eye shifts occurred during testing Visual Fields: Right visual field deficits Depth perception: pt reports decreased depth perception, did not recognize stimulus until nearly at midline on L side Some decreased recognition of simulus on L vision field with confrontation testing  Patient has difficulty with following activities due to following visual impairments: embroidering, pouring drink into cup, handwriting                                                                                                                            TREATMENT DATE:  12/16/23 Bell cancellation test: pt locating 31/35 bells in 2:44.  Pt omitting bells primarily in middle of picture and 1 to far, lower right.  Upon review, pt stating that she had a hard time seeing the missing ones due to too close to another object and disappearing into mixed background.   Trail making: Trail making A: 1:37. Pt picking up pen x2 and requiring increased time with sequencing after 14.  Attempted Trail B, stopping after 2:40 at [facility] after  multiple omitted letters and numbers.  Pt demonstrating difficulty with alternating attention between numbers and letters.  Completed additional attempt with alternative printout and pt wearing  her reading glasses.  Pt completing in 3:28 with one error, skipping number 2.  OT educating on functional carryover of alternating attention and educating on decreasing extra stimulus and focus on one task at a time due to challenges present during alternating attention task this session.   9 hole peg test: Right: 31.56 sec; Left: 29.25.  Pt with no issues with recall of 2 step directions and no change in coordination s/p new CVA Vision: OT educated pt on lighthouse strategy with scanning head and eyes to Left during mobility (particularly in unfamiliar and/or cluttered environment), having person walk on L to faciltiate scanning to L, setting up grooming items to L to increase scanning to L.  OT also recommending use of hand placement or other anchor on L side of paper or page when reading to fully scan to Left.  Recommended use of bookmark for increased ease with line follow as pt with difficulty with scanning during Bell cancellation task due to increased stimulation.  Provided with handout.    11/17/23 Educated on depth perception exercises, provided with handout (see pt instructions).     PATIENT EDUCATION: Education details: vision and alternating attention Person educated: Patient Education method: Explanation, Verbal cues, and Handouts Education comprehension: verbalized understanding and needs further education  HOME EXERCISE PROGRAM: 11/17/23 - depth perception activities (see pt instructions)   GOALS: Goals reviewed with patient? Yes  SHORT TERM GOALS: Target date: 12/17/23  Pt will be independent with vision HEP with use of handouts. Baseline: new to OPOT Goal status: in progress  2.  Pt will be independent with coordination HEP with use of handouts. Baseline: new to OPOT Goal status:  in progress  3.  Pt will verbalize understanding of compensatory visual scanning strategies to increase attention to L visual field. Baseline: new to OPOT Goal status:  in  progress  4.  Pt will improve visual scanning/use compensatory strategies to perform cancellation task with 75% accuracy.  Baseline: 31/35 on Bell cancellation task Goal status:  in progress   LONG TERM GOALS: Target date: 01/14/24  Pt will demonstrate improved fine motor coordination for ADLs as evidenced by decreasing 9 hole peg test score for RUE by 3 secs Baseline: Right: 34.72 sec; Left: 30.94 sec  12/16/23: Right: 31.56 sec Goal status: MET  2.  Pt will navigate a moderately busy environment, completing dual task activity and/or following multi-step commands with 90% accuracy. Baseline: difficulty with 2 step commands Goal status: INITIAL  3.  Pt will demonstrate improved coordination to allow for improved speed with handwriting to completing simple sentence in <15 seconds without compromising 100% legibility. Baseline: 17.5 sec Goal status: INITIAL  4.  Pt will verbalize and/or demonstrate understanding of energy conservation strategies to increase engagement in and safety with ADLs and IADLs.  Baseline: new to OPOT Goal status: INITIAL   ASSESSMENT:  CLINICAL IMPRESSION: Patient is a 83 y.o. female who was seen today for occupational therapy treatment session with focus on vision and coordination s/p CVA. Pt present for first treatment session s/p eval on 11/17/23 due to additional hospitalization with new CVA and scheduling challenges.  Pt continues to demonstrate L visual field impairment upon assessment of visual fields, however more challenged by mixed background with Bell cancellation assessment and difficulty with alternating attention tasks.  Pt educated on visual compensation strategy  of lighthouse scanning and simple tasks to facilitate scanning and attention to L environment.  Pt will continue to benefit from skilled occupational therapy services to address coordination, pain management, balance, GM/FM control, cognition, safety awareness, introduction of compensatory  strategies/AE prn, visual-perception, and implementation of an HEP to improve participation and safety during ADLs and IADLs.    PERFORMANCE DEFICITS: in functional skills including ADLs, IADLs, coordination, pain, Fine motor control, Gross motor control, balance, body mechanics, endurance, decreased knowledge of precautions, decreased knowledge of use of DME, vision, and UE functional use, cognitive skills including memory and safety awareness, and psychosocial skills including coping strategies, environmental adaptation, and routines and behaviors.     PLAN:  OT FREQUENCY: 1-2x/week  OT DURATION: 6 weeks  PLANNED INTERVENTIONS: 97168 OT Re-evaluation, 97535 self care/ADL training, 02889 therapeutic exercise, 97530 therapeutic activity, 97112 neuromuscular re-education, 97140 manual therapy, balance training, functional mobility training, visual/perceptual remediation/compensation, psychosocial skills training, energy conservation, coping strategies training, patient/family education, and DME and/or AE instructions  RECOMMENDED OTHER SERVICES: NA  CONSULTED AND AGREED WITH PLAN OF CARE: Patient  PLAN FOR NEXT SESSION:  further explore depth perception and L attention during functional and table top tasks, 2-3 step directions for recall, educate on energy conservation.   KAYLENE DOMINO, OTR/L 12/16/2023, 3:58 PM  [facility] at [facility] [address] [place], [address] [place], [place] [address] Phone # [phone] Fax # [phone]

## 2023-12-16 NOTE — Therapy (Signed)
 OUTPATIENT PHYSICAL THERAPY TREATMENT   Patient Name: Annette Gross MRN: [id number] DOB:[birth year], 83 y.o., female Today's Date: 12/16/2023   PCP: Alys Schuyler HERO, PA REFERRING PROVIDER: Gayland Lauraine PARAS, NP  END OF SESSION:  PT End of Session - 12/16/23 1617     Visit Number 4    Number of Visits 12    Date for PT Re-Evaluation 01/12/24    Authorization Type [employer]    Authorization Time Period Auth#: [id number] approved 12 PT visits from 11/17/2023-12/29/2023    Authorization - Visit Number 3    Authorization - Number of Visits 12    Progress Note Due on Visit 10    PT Start Time 1615    PT Stop Time 1700    PT Time Calculation (min) 45 min    Behavior During Therapy [facility] for tasks assessed/performed             Past Medical History:  Diagnosis Date   Anemia    many yrs ago   Anxiety    Asthma    no recent issues   Cancer (HCC)    s/p thyroidectomy 2018   Complication of anesthesia    Cystitis, interstitial    Degenerative disc disease, lumbar    Degenerative joint disease    spinal stenosis Chronic low back pain   Depression    Diabetes mellitus    diet control.   Diverticulitis 2005   Diverticulosis 2005   Factor V deficiency (HCC)    Fibromyalgia    Fracture of right foot 1995   GERD (gastroesophageal reflux disease)    History of hiatal hernia    Hyperlipidemia    Hypertension    off med x 5 yrs.   IBS (irritable bowel syndrome)    Insomnia    Iron deficiency    Liver hemangioma 1999   MRI   Mononucleosis    Morton's neuroma    Left foot   Nuclear sclerotic cataract of left eye 08/29/2019   Peripheral neuropathy    Pneumonia    years 89 & 89.  None since   PONV (postoperative nausea and vomiting)    nausea no vomiting   Rectocele    Transfusion history    child x2, many yrs ago after childbirthhemorrhage   Umbilical hernia 2012   Past Surgical History:  Procedure Laterality Date   blood vessel tumor removal   1980   from chin   CHOLECYSTECTOMY     COLONOSCOPY WITH PROPOFOL  N/A 09/02/2015   Procedure: COLONOSCOPY WITH PROPOFOL ;  Surgeon: Gladis MARLA Louder, MD;  Location: [facility];  Service: Endoscopy;  Laterality: N/A;   GANGLION CYST EXCISION     right   HEMORRHOID SURGERY     HERNIA REPAIR     repair was aimed at the [facility]   HIATAL HERNIA REPAIR     and nissen fundoplication   LAPAROSCOPIC ESOPHAGOGASTRIC FUNDOPLASTY     LAPAROSCOPIC INCISIONAL / UMBILICAL / VENTRAL HERNIA REPAIR     umbilical hernia   LOOP RECORDER INSERTION N/A 09/13/2023   Procedure: LOOP RECORDER INSERTION;  Surgeon: Leverne Charlies Helling, PA-C;  Location: [facility];  Service: Cardiovascular;  Laterality: N/A;   OOPHORECTOMY     left   RIGHT OOPHORECTOMY     '05-laparaoscopic   SHOULDER SURGERY Right    THYROIDECTOMY N/A 07/06/2016   Procedure: TOTAL THYROIDECTOMY;  Surgeon: Krystal Russell, MD;  Location[facility];  Service: General;  Laterality: N/A;  TONSILLECTOMY     TOTAL THYROIDECTOMY  07/06/2016   TRANSESOPHAGEAL ECHOCARDIOGRAM (CATH LAB) N/A 09/13/2023   Procedure: TRANSESOPHAGEAL ECHOCARDIOGRAM;  Surgeon: Mona Vinie BROCKS, MD;  Location: [facility];  Service: Cardiovascular;  Laterality: N/A;   Patient Active Problem List   Diagnosis Date Noted   Hypertension    CVA (cerebral vascular accident) (HCC) 09/10/2023   Stroke (cerebrum) (HCC) 09/10/2023   Pseudophakia, both eyes 10/13/2021   Dermatochalasis of both lower eyelids 10/13/2021   Moderate nonproliferative diabetic retinopathy of left eye (HCC) 10/10/2020   Moderate nonproliferative diabetic retinopathy of right eye (HCC) 08/29/2019   Early stage nonexudative age-related macular degeneration of both eyes 08/29/2019   Right posterior capsular opacification 08/29/2019   Left thyroid  nodule 07/06/2016   Benign neoplasm of skin of upper limb, including shoulder 04/19/2012   Ventral hernia, recurrent 01/26/2011    ONSET DATE:  09/10/23  REFERRING DIAG:  I63.9 (ICD-10-CM) - Acute CVA (cerebrovascular accident) (HCC)    THERAPY DIAG:  Muscle weakness (generalized)  Difficulty in walking, not elsewhere classified  Dizziness and giddiness  Unsteadiness on feet  Hemiplegia and hemiparesis following cerebral infarction affecting right dominant side (HCC)  Rationale for Evaluation and Treatment: Rehabilitation  SUBJECTIVE:                                                                                                                                                                                             SUBJECTIVE STATEMENT: Dizziness isn't too bad today. Have been able to walk short distances w/out holding on to anything  From re-eval: Pt reports she had another CVA. Pt admitted to hospital on 11/26/23 to 11/29/23 for a R cerebellar infarct. Reports meclizine  has been helping but dizziness has been worse. Vision has been the same. Balance is worse. Pt states her left side is a little weaker than it was but isn't sure if it's noticeable. Pt is left handed. Pt states she just can't get up and walk around without RW.   From eval: Pt with CVA in May 2025 with loss of vision to left eye and reports some improvement in vision in the mean time.  Reports no therapy services in the interim. Previously independent-modified independent with functional mobility.  Noting some ongoing balance issues and LLE weakness  Pt accompanied by: self  PERTINENT HISTORY: 83 y.o. year old female with stroke in May 2025.  Left eye central retinal artery occlusion and multiple small scattered ischemic infarcts.  Cardioembolic source.  Presented to her ophthalmologist with painless loss of vision to the left eye.  Loop recorder placed 09/13/23.  TEE negative.  Vascular risk factors: HTN, HLD. Incidental 8 mm meningioma overlying the anterior right frontal lobe  PAIN:  Are you having pain? No  PRECAUTIONS: None  RED FLAGS: None   WEIGHT  BEARING RESTRICTIONS: No  FALLS: Has patient fallen in last 6 months? Yes. Number of falls 2 falls involving going down stairs; 1 recent fall on to grass 11/17/23  LIVING ENVIRONMENT: Lives with: lives with their son Lives in: House/apartment ground floor set-up Stairs: 6 steps to enter Has following equipment at home: Single point cane and Environmental consultant - 4 wheeled  PLOF: Independent with household mobility with device and Independent with household mobility without device  PATIENT GOALS: improve balance  OBJECTIVE:   TODAY'S TREATMENT: 12/16/23 Activity Comments  138/83 mmHg, 91 bpm   Seated LE AROM 20x for warm-up   Seated VOR x 1 Difficulty w/ horizontal Good control w/ vertical  5xSTS test 17 sec  M-CTSIB Condition 1: mild Condition 2: mod-severe x 8 sec Condition 3:mild Condition 4:severe x 3 sec  balance -feet apart eyes closed x 15 sec -feet together EO w/ slow head movements -semi-tandem forward/retrowalk x 2 min: single UE support -sidestep x 2 min      TODAY'S TREATMENT: 12/16/2023 Activity Comments  Nustep L5 x 8 min UEs/LEs   At counter: Heel/toe raise 3x10 Hip abd 2x10 Staggered stance rock fwd/bwd 2x10 UE support  Seated: LAQ with red TB 2x10 Smooth pursuit horizontal and vertical 2x30 each Saccades horizontal and vertical x 30 each VOR cancellation horizontal and vertial x30 each VOR x 1 horizontal and vertical 2x30   Minor deviations noted off target with under/overshooting       HOME EXERCISE PROGRAM Access Code: [email or web address] URL: [email or web address] Date: 12/01/2023 Prepared by: Gellen April Earnie Starring  Exercises - Seated Heel Toe Raises  - 1 x daily - 7 x weekly - 3 sets - 10 reps - Seated Long Arc Quad  - 1 x daily - 7 x weekly - 3 sets - 10 reps - Seated Hip Adduction Isometrics with Ball  - 1 x daily - 7 x weekly - 3 sets - 10 reps - Staggered Sit-to-Stand  - 1 x daily - 7 x weekly - 3 sets - 5 reps - Seated Gaze  Stabilization with Head Rotation  - 3-5 x daily - 7 x weekly - 20 sec hold - Seated Gaze Stabilization with Head Nod  - 3-5 x daily - 7 x weekly - 20 sec hold  PATIENT EDUCATION: Education details: Exam findings and comparison to initial PT evaluation, HEP updates Person educated: Patient Education method: Explanation and Handouts Education comprehension: verbalized understanding  --------------------------------------------  VESTIBULAR ASSESSMENT 12/01/23:  GENERAL OBSERVATION: n/a   SYMPTOM BEHAVIOR:  Subjective history: Increased dizziness after acute new CVA on 11/26/23  Non-Vestibular symptoms: nausea/vomiting  Type of dizziness: Imbalance (Disequilibrium), Unsteady with head/body turns, and It feels like I'm moving.  Frequency: Comes and goes, maybe 3-5x in the day  Duration: Can last a couple of hours  Aggravating factors: Induced by motion: turning body quickly and turning head quickly  Relieving factors: medication and rest  Progression of symptoms: worse  OCULOMOTOR EXAM:  Ocular Alignment: normal  Ocular ROM: No Limitations  Spontaneous Nystagmus: absent  Gaze-Induced Nystagmus: absent  Smooth Pursuits: intact  Saccades: slow  FRENZEL - FIXATION SUPRESSED: Did not assess  VESTIBULAR - OCULAR REFLEX:   Slow VOR: Positive Left  VOR Cancellation: Normal  Head-Impulse Test: HIT Right: negative HIT Left: positive  Dynamic  Visual Acuity: TBA   POSITIONAL TESTING: did not assess   MOTION SENSITIVITY: did not assess   OTHOSTATICS: not done  DIAGNOSTIC FINDINGS:   MR BRAIN WO CONTRAST Result Date: 11/26/2023 CLINICAL DATA:  Neuro deficit, acute, stroke suspected EXAM: MRI HEAD WITHOUT CONTRAST TECHNIQUE: Multiplanar, multiecho pulse sequences of the brain and surrounding structures were obtained without intravenous contrast. COMPARISON:  MRI head Sep 10, 2023 FINDINGS: Brain: Punctate acute infarct in the right cerebellum (series five, image sixty one). No acute  hemorrhage, hydrocephalus, extra-axial collection or mass lesion. Multiple small remote lacunar infarcts in the frontal and parietal white matter bilaterally. Patchy T2/FLAIR hyperintensities, compatible with chronic microvascular ischemic change. Vascular: Normal flow voids. Skull and upper cervical spine: Normal marrow signal. Sinuses/Orbits: Negative. IMPRESSION: Punctate acute infarct in the right cerebellum. Electronically Signed   By: Gilmore GORMAN Molt M.D.   On: 11/26/2023 03:24  09/10/23 MRI multiple small acute infarcts scattered within bilateral cerebral hemispheres, likely embolic in nature, 8 mm meningioma overlying anterior right frontal lobe, background atrophy and chronic small vessel ischemic disease - MRA no LVO or significant stenosis - Carotid Doppler unremarkable - Bilateral lower extremity ultrasound negative - 2D echo EF 60 to 65% - Loop recorder placed - TEE EF 60 to 65%, negative for PFO - LDL 30 - A1c 6.6  FROM INITIAL EVALUATION 11/17/23 (UNLESS OTHERWISE NOTED)  COGNITION: Overall cognitive status: Within functional limits for tasks assessed   SENSATION: WFL  COORDINATION: WFL Slight dysmetria finger to nose LUE  EDEMA:  None noted  MUSCLE TONE: NT  MUSCLE LENGTH: WFL  DTRs:  NT  POSTURE: scoliosis w/ pelvic obliquity   LOWER EXTREMITY ROM:    WFL   LOWER EXTREMITY MMT:    MMT Right Eval Left Eval R/L 12/01/23  Hip flexion 5 4 3+/3+  Hip extension   3+/3+  Hip abduction 4 3- 3/3-  Hip adduction 4 4   Hip internal rotation     Hip external rotation     Knee flexion 5 4 5/4  Knee extension 5 4 5/4  Ankle dorsiflexion 5 5   Ankle plantarflexion     Ankle inversion     Ankle eversion     (Blank rows = not tested)  BED MOBILITY:  Not tested reports indep  TRANSFERS: Sit to stand: Complete Independence  Assistive device utilized: None     Chair to chair: Modified independence  Assistive device utilized: UE support       RAMP:  Not  tested  CURB:  Findings: SBA-CGA  STAIRS: Findings: Comments: Supervision w/ BHR GAIT: Findings: Gait Characteristics: trendelenburg and Comments: left Modified indep level surfaces, CGA uneven  FUNCTIONAL TESTS:   11/17/23 5 times sit to stand: 18.78 sec Timed up and go (TUG): 19 sec w/ cane 10 meter walk test: 20 sec w/ cane = 1.64 ft/sec Berg Balance Test:42/56  M-CTSIB  Condition 1: Firm Surface, EO 30 Sec, Moderate Sway  Condition 2: Firm Surface, EC 6 Sec, Severe Sway  Condition 3: Foam Surface, EO  Sec,  Sway  Condition 4: Foam Surface, EC  Sec,  Sway    12/01/23 5 times sit to stand: 27.47 sec (R hand on knee, uses legs against bed) 10 meter walk test: 17.03 sec with  Lars Balance Test: 27/56 TUG: Deferred today due to pt L hip fatigued and starting to hurt after functional testing MCTSIB: Deferred today due to pt L hip pain  GOALS: Goals reviewed with patient? Yes  SHORT TERM GOALS: Target date: 12/08/2023    Patient will be independent in HEP to improve functional outcomes Baseline: Goal status: MET  2.  Demo improved postural stability per mild-moderate sway x 30 sec condition 2 M-CTSIB to improve safety with ADL Baseline: severe x 6 sec; severe x 8 sec Goal status: NOT MET 12/16/23  3.  Demo improved BLE strength and reduce risk for falls per time 15 sec 5xSTS test Baseline: 18 sec; 17 sec Goal status: NOT MET 12/16/23    LONG TERM GOALS: Target date: 01/12/2024    Demo improved left hip abduction strength to 3+/5 for improved single limb support and to minimize gait dysfunction Baseline: 3-/5, Trendelenberg Goal status: IN PROGRESS  2.  Modified independent stair ambulation for improved safety with entering/exiting home Baseline: supervision BHR Goal status: IN PROGRESS  3.  Reduce risk for falls per score 48/56 Berg  Balance Test Baseline 11/17/23: 42/56 12/01/23: 21/56 Goal status: IN PROGRESS  4.  Reduce risk for falls per time 14 sec TUG test Baseline: 19 sec w/ cane Goal status: IN PROGRESS  5.  Modified independent with uneven surfaces and curb negotiation to improve safety in community Baseline: CGA Goal status: IN PROGRESS    ASSESSMENT:  CLINICAL IMPRESSION: Review to gaze stabilization w/ greater difficulty and multiple corrective saccades for horizontal VOR vs vertical. 5xSTS test requiring 17 sec indicating high risk for falls and BLE weakness. M-CTSIB revealing for difficulty under eyes closed conditions on firm and compliant surface. Progressed balance training for multisensory conditions w/ improved stability/tolerance using wide BOS. Dynamic balance activities to improve single limb support for improved stability in gait.    OBJECTIVE IMPAIRMENTS: Abnormal gait, decreased activity tolerance, decreased balance, difficulty walking, decreased strength, and postural dysfunction.   ACTIVITY LIMITATIONS: carrying, lifting, standing, stairs, transfers, reach over head, and locomotion level  PARTICIPATION LIMITATIONS: meal prep, cleaning, laundry, interpersonal relationship, driving, shopping, and community activity  PERSONAL FACTORS: Age, Time since onset of injury/illness/exacerbation, and 1-2 comorbidities: PMH are also affecting patient's functional outcome.   REHAB POTENTIAL: Good  CLINICAL DECISION MAKING: Evolving/moderate complexity  EVALUATION COMPLEXITY: Moderate  PLAN:  PT FREQUENCY: 1-2x/week  PT DURATION: 6 weeks  PLANNED INTERVENTIONS: [id number]- PT Re-evaluation, 97750- Physical Performance Testing, 97110-Therapeutic exercises, 97530- Therapeutic activity, [id number]- Neuromuscular re-education, 97535- Self Care, [id number]- Manual therapy, [id number]- Gait training, [phone]- Orthotic Initial, [phone]- Canalith repositioning, [id number]- Aquatic Therapy, [phone]- Electrical stimulation  (unattended), [phone] (1-2 muscles), [id number] (3+ muscles)- Dry Needling, Patient/Family education, Balance training, Stair training, Taping, Joint mobilization, Spinal mobilization, Vestibular training, Cryotherapy, and Moist heat  PLAN FOR NEXT SESSION: Finish functional tests. HEP review, vestibular training, strengthening, balance   Jonette MARLA Sandifer, PT, DPT 12/16/23 4:18 PM Phone: [phone] Fax: [phone]  [facility] at [facility] [address] [place], [address] [place], [place] [address] Phone # [phone] Fax # [phone]

## 2023-12-16 NOTE — Therapy (Signed)
 OUTPATIENT SPEECH LANGUAGE PATHOLOGY TREATMENT/RECERTIFICATION   Patient Name: Annette Gross MRN: [id number] DOB:June 04, 1940, 83 y.o., female Today's Date: 12/16/2023  PCP: Annette Bitters, PA REFERRING PROVIDER: Gayland Annette PARAS, NP  END OF SESSION:  End of Session - 12/16/23 1608     Visit Number 2    Number of Visits 5    Date for SLP Re-Evaluation 12/24/23    SLP Start Time 1533    SLP Stop Time  1606    SLP Time Calculation (min) 33 min    Activity Tolerance Patient tolerated treatment well          Past Medical History:  Diagnosis Date   Anemia    many yrs ago   Anxiety    Asthma    no recent issues   Cancer (HCC)    s/p thyroidectomy 2018   Complication of anesthesia    Cystitis, interstitial    Degenerative disc disease, lumbar    Degenerative joint disease    spinal stenosis Chronic low back pain   Depression    Diabetes mellitus    diet control.   Diverticulitis 2005   Diverticulosis 2005   Factor V deficiency (HCC)    Fibromyalgia    Fracture of right foot 1995   GERD (gastroesophageal reflux disease)    History of hiatal hernia    Hyperlipidemia    Hypertension    off med x 5 yrs.   IBS (irritable bowel syndrome)    Insomnia    Iron deficiency    Liver hemangioma 1999   MRI   Mononucleosis    Morton's neuroma    Left foot   Nuclear sclerotic cataract of left eye 08/29/2019   Peripheral neuropathy    Pneumonia    years 89 & 89.  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   CHOLECYSTECTOMY     COLONOSCOPY WITH PROPOFOL  N/A 09/02/2015   Procedure: COLONOSCOPY WITH PROPOFOL ;  Surgeon: Annette MARLA Louder, MD;  Location: [facility];  Service: Endoscopy;  Laterality: N/A;   GANGLION CYST EXCISION      right   HEMORRHOID SURGERY     HERNIA REPAIR     repair was [facility]   HIATAL HERNIA REPAIR     and nissen fundoplication   LAPAROSCOPIC ESOPHAGOGASTRIC FUNDOPLASTY     LAPAROSCOPIC INCISIONAL / UMBILICAL / VENTRAL HERNIA REPAIR     umbilical hernia   LOOP RECORDER INSERTION N/A 09/13/2023   Procedure: LOOP RECORDER INSERTION;  Surgeon: Annette Charlies Helling, PA-C;  Location: [facility];  Service: Cardiovascular;  Laterality: N/A;   OOPHORECTOMY     left   RIGHT OOPHORECTOMY     '05-laparaoscopic   SHOULDER SURGERY Right    THYROIDECTOMY N/A 07/06/2016   Procedure: TOTAL THYROIDECTOMY;  Surgeon: Annette Russell, MD;  Location: [facility] OR;  Service: General;  Laterality: N/A;   TONSILLECTOMY     TOTAL THYROIDECTOMY  07/06/2016   TRANSESOPHAGEAL ECHOCARDIOGRAM (CATH LAB) N/A 09/13/2023   Procedure: TRANSESOPHAGEAL ECHOCARDIOGRAM;  Surgeon: Annette Vinie BROCKS, MD;  Location: [facility];  Service: Cardiovascular;  Laterality: N/A;   Patient Active Problem List   Diagnosis Date Noted  Hypertension    CVA (cerebral vascular accident) (HCC) 09/10/2023   Stroke (cerebrum) (HCC) 09/10/2023   Pseudophakia, both eyes 10/13/2021   Dermatochalasis of both lower eyelids 10/13/2021   Moderate nonproliferative diabetic retinopathy of left eye (HCC) 10/10/2020   Moderate nonproliferative diabetic retinopathy of right eye (HCC) 08/29/2019   Early stage nonexudative age-related macular degeneration of both eyes 08/29/2019   Right posterior capsular opacification 08/29/2019   Left thyroid  nodule 07/06/2016   Benign neoplasm of skin of upper limb, including shoulder 04/19/2012   Ventral hernia, recurrent 01/26/2011   Speech Therapy Progress Note  Dates of Reporting Period: 11/17/23 to present (two sessions)  Subjective Statement: Pt was seen for eval and one session. She was hospitalized from 11/26/23 to 11/29/23.  Objective: Pt learned how to complete Semantic Feature Analysis  today. She was encouraged to use description strategy if she experiences anomia.  Goal Update: See below. All goals remain as pt has been seen once.   Plan: See pt for three more ST sessions  Reason Skilled Services are Required: Not reached max potential yet.     ONSET DATE: 09/10/23  REFERRING DIAG:  I63.9 (ICD-10-CM) - Acute CVA (cerebrovascular accident) (HCC)    THERAPY DIAG:  Aphasia  Rationale for Evaluation and Treatment: Rehabilitation  SUBJECTIVE:   SUBJECTIVE STATEMENT: Pt had significant dizzy spell and was admitted due to acute infarct in rt cerebellum on 11/26/23.   Pt accompanied by: self  PERTINENT HISTORY:  MRI HEAD WO CONTRAST IMPRESSION: Punctate acute infarct in the right cerebellum.   From Annette Gross HISTORY OF PRESENT ILLNESS: Today 10/14/23 Here with Annette Gross, son's partner. They all live together. Continues to have large vision loss to the left eye, but can see crescent shaped light at the top that is getting larger. Saw ophthalmology.  She feels like right eye is fine. No other symptoms. BP was high today 162/81, then good on recheck 120/70 olmesartan. Continues on home Repatha. Goes to [place] for primary care. Mentions occasionally will have tingling to left side of face every few days, no longer than 30 minutes, only at V2 area radiates behind ear, no headache or dental pain. Mentions prior to stroke had some memory concerns remembering names or hard time finding her words. Would like to get occupational and speech therapy arranged. She doesn't drive since CVA. Other than driving, back to normal activities.   PAIN:  Are you having pain? Yes: NPRS scale: 2/10 Pain location: rt knee/hip Pain description: sore  FALLS: Has patient fallen in last 6 months?  See PT evaluation for details   PATIENT GOALS: Concerned about word finding incidents x2-3/week  OBJECTIVE:  Note: Objective measures were completed at Evaluation unless otherwise  noted.  DIAGNOSTIC FINDINGS:  CT 09/10/23 IMPRESSION: No acute CT finding. Age related volume loss. Chronic small-vessel ischemic changes of the white matter and basal ganglia.  MRI 09/10/23 IMPRESSION: 1. Multiple small acute infarcts scattered within the bilateral cerebral hemispheres, 10-15 in number and measuring up to 8 mm. Given involvement of multiple vascular territories, findings suggest sequelae of an embolic process. 2. 8 mm meningioma overlying the anterior right frontal lobe. The mass slightly indents the underlying brain parenchyma without underlying parenchymal edema. 3. Background parenchymal atrophy and chronic small vessel ischemic disease.   SLE - 09/12/23 Clinical Impression  Pt reports concerns with processing PTA but attributes this to age. She states she is typically independent but has assistance PRN from her son and his family. She scored 23/30  on the SLUMS (a 27 or above is considered WFL) characterized by deficits related to memory (retrieval > stroage), selective attention, and executive functioning. She accurately recalled 3/5 novel words after a delay but states this is likely the most signficant change from her baseline. Pt had difficulty with a clock drawing task, repeating numbers and incorrectly placing the hands. Question effect of L vision deficits. Recommend ongoing SLP f/u on an OP basis, will sign off acutelty.   PATIENT REPORTED OUTCOME MEASURES (PROM): Communication Effectiveness Survey: provided to pt 12/16/23 and asked to return next session                                                                                                                            TREATMENT DATE:  Semantic Feature Analysis = SFA  12/16/23: PROM provided to return next session. Pt looks relatively unchanged with speech and language after rt cerebellar CVA on 11/26/23. Will recert today. SLP introduced SFA to pt today and collaborated with her to complete one with  platter as the target word. Pt req'd initial cues for writing the target word in the sentences but was independent after that. In conversation with SLP during the session today neither anomia nor a verbal expression deficit were noted. Pt will continue with this process (SFA) if she experiences anomia with a noun. Homework provided.  11/17/23: n/a  PATIENT EDUCATION: Education details: See treatment date for more details Person educated: Patient Education method: Explanation Education comprehension: verbalized understanding   GOALS: Goals reviewed with patient? Yes  SHORT TERM GOALS:  = LONG TERM GOALS: Target date:  01/28/24 (due to pt unable to schedule last two weeks of ST until week of 01/17/24)  Pt will perform SFA with mod I in 2 sessions Baseline:  Goal status: INITIAL  2.  Pt will perform VNEST with mod I in 2 sessions Baseline:  Goal status: INITIAL  3.  Pt will report using one new compensatory strategy for anomia between two sessions Baseline:  Goal status: INITIAL  4.  Pt will score better with PROM than initial administration Baseline:  Goal status: INITIAL   ASSESSMENT:  CLINICAL IMPRESSION: RECERT TODAY due to pt's hospitalization/cert date. Patient is a 83 y.o. F who was seen today for treatment of aphasia following CVA Sep 10, 2023. She has premorbid dx of ADD (since 1999-2000 pt stated). Pt mentions prior to CVA had some s/sx aphasia and memory disorder. She continues to experience anomia x2-3 per week. Today she used description strategy which SLP highlighted was another good strategy for her to use. Pt would cont to benefit from as hort course of ST targeting a wider use of compensations, and teaching pt home activities to strengthen word finding and verbal expression (SFA and VNEST). She is managing medications and bills independently without errors reported to SLP. She does not have her calendar as she has recently moved (prior to CVA) and has not yet  located it.  OBJECTIVE IMPAIRMENTS: include expressive language. These impairments are limiting patient from effectively communicating at home and in community. Factors affecting potential to achieve goals and functional outcome are premorbid dx of ADD.SABRA Patient will benefit from skilled SLP services to address above impairments and improve overall function.  REHAB POTENTIAL: Excellent  PLAN:  SLP FREQUENCY: 1x/week  SLP DURATION: 4 weeks total  PLANNED INTERVENTIONS: Language facilitation, Internal/external aids, Multimodal communication approach, SLP instruction and feedback, Compensatory strategies, Patient/family education, and [id number] Treatment of speech (30 or 45 min)     Rosio Weiss, CCC-SLP 12/16/2023, 4:10 PM

## 2023-12-20 ENCOUNTER — Encounter: Payer: Self-pay | Admitting: Physical Therapy

## 2023-12-20 ENCOUNTER — Ambulatory Visit: Admitting: Physical Therapy

## 2023-12-20 DIAGNOSIS — R42 Dizziness and giddiness: Secondary | ICD-10-CM

## 2023-12-20 DIAGNOSIS — R262 Difficulty in walking, not elsewhere classified: Secondary | ICD-10-CM | POA: Diagnosis not present

## 2023-12-20 DIAGNOSIS — M6281 Muscle weakness (generalized): Secondary | ICD-10-CM | POA: Diagnosis not present

## 2023-12-20 DIAGNOSIS — I69351 Hemiplegia and hemiparesis following cerebral infarction affecting right dominant side: Secondary | ICD-10-CM | POA: Diagnosis not present

## 2023-12-20 DIAGNOSIS — R2681 Unsteadiness on feet: Secondary | ICD-10-CM

## 2023-12-20 DIAGNOSIS — R41842 Visuospatial deficit: Secondary | ICD-10-CM | POA: Diagnosis not present

## 2023-12-20 DIAGNOSIS — R4701 Aphasia: Secondary | ICD-10-CM | POA: Diagnosis not present

## 2023-12-21 ENCOUNTER — Ambulatory Visit

## 2023-12-22 ENCOUNTER — Ambulatory Visit

## 2023-12-22 DIAGNOSIS — M6281 Muscle weakness (generalized): Secondary | ICD-10-CM | POA: Diagnosis not present

## 2023-12-22 DIAGNOSIS — R41842 Visuospatial deficit: Secondary | ICD-10-CM | POA: Diagnosis not present

## 2023-12-22 DIAGNOSIS — R2681 Unsteadiness on feet: Secondary | ICD-10-CM | POA: Diagnosis not present

## 2023-12-22 DIAGNOSIS — R4701 Aphasia: Secondary | ICD-10-CM | POA: Diagnosis not present

## 2023-12-22 DIAGNOSIS — R42 Dizziness and giddiness: Secondary | ICD-10-CM

## 2023-12-22 DIAGNOSIS — I69351 Hemiplegia and hemiparesis following cerebral infarction affecting right dominant side: Secondary | ICD-10-CM

## 2023-12-22 DIAGNOSIS — R262 Difficulty in walking, not elsewhere classified: Secondary | ICD-10-CM | POA: Diagnosis not present

## 2023-12-22 NOTE — Therapy (Signed)
 OUTPATIENT PHYSICAL THERAPY TREATMENT   Patient Name: Annette Gross MRN: [id number] DOB:01/31/1941, 83 y.o., female Today's Date: 12/22/2023   PCP: Alys Schuyler [place], [name] REFERRING PROVIDER: Gayland Lauraine PARAS, NP  END OF SESSION:  PT End of Session - 12/22/23 1446     Visit Number 6    Number of Visits 12    Date for PT Re-Evaluation 01/12/24    Authorization Type [employer]    Authorization Time Period Auth#: [id number] approved 12 PT visits from 11/17/2023-12/29/2023    Authorization - Visit Number 5    Authorization - Number of Visits 12    Progress Note Due on Visit 10    PT Start Time 1445    PT Stop Time 1530    PT Time Calculation (min) 45 min    Equipment Utilized During Treatment Gait belt    Activity Tolerance Patient tolerated treatment well    Behavior During Therapy WFL for tasks assessed/performed             Past Medical History:  Diagnosis Date   Anemia    many yrs ago   Anxiety    Asthma    no recent issues   Cancer (HCC)    s/p thyroidectomy 2018   Complication of anesthesia    Cystitis, interstitial    Degenerative disc disease, lumbar    Degenerative joint disease    spinal stenosis Chronic low back pain   Depression    Diabetes mellitus    diet control.   Diverticulitis 2005   Diverticulosis 2005   Factor V deficiency (HCC)    Fibromyalgia    Fracture of right foot 1995   GERD (gastroesophageal reflux disease)    History of hiatal hernia    Hyperlipidemia    Hypertension    off med x 5 yrs.   IBS (irritable bowel syndrome)    Insomnia    Iron deficiency    Liver hemangioma 1999   MRI   Mononucleosis    Morton's neuroma    Left foot   Nuclear sclerotic cataract of left eye 08/29/2019   Peripheral neuropathy    Pneumonia    years 89 & 89.  None since   PONV (postoperative nausea and vomiting)    nausea no vomiting   Rectocele    Transfusion history    child x2, many yrs ago after childbirthhemorrhage    Umbilical hernia 2012   Past Surgical History:  Procedure Laterality Date   blood vessel tumor removal  1980   from chin   CHOLECYSTECTOMY     COLONOSCOPY WITH PROPOFOL  N/A 09/02/2015   Procedure: COLONOSCOPY WITH PROPOFOL ;  Surgeon: Gladis MARLA Louder, MD;  Location: [facility];  Service: Endoscopy;  Laterality: N/A;   GANGLION CYST EXCISION     right   HEMORRHOID SURGERY     HERNIA REPAIR     repair was aimed at [facility]   HIATAL HERNIA REPAIR     and nissen fundoplication   LAPAROSCOPIC ESOPHAGOGASTRIC FUNDOPLASTY     LAPAROSCOPIC INCISIONAL / UMBILICAL / VENTRAL HERNIA REPAIR     umbilical hernia   LOOP RECORDER INSERTION N/A 09/13/2023   Procedure: LOOP RECORDER INSERTION;  Surgeon: Leverne Charlies Helling, PA-C;  Location: [facility];  Service: Cardiovascular;  Laterality: N/A;   OOPHORECTOMY     left   RIGHT OOPHORECTOMY     '05-laparaoscopic   SHOULDER SURGERY Right    THYROIDECTOMY N/A 07/06/2016   Procedure: TOTAL  THYROIDECTOMY;  Surgeon: Krystal Russell, MD;  Location: [facility] OR;  Service: General;  Laterality: N/A;   TONSILLECTOMY     TOTAL THYROIDECTOMY  07/06/2016   TRANSESOPHAGEAL ECHOCARDIOGRAM (CATH LAB) N/A 09/13/2023   Procedure: TRANSESOPHAGEAL ECHOCARDIOGRAM;  Surgeon: Mona Vinie BROCKS, MD;  Location: [facility];  Service: Cardiovascular;  Laterality: N/A;   Patient Active Problem List   Diagnosis Date Noted   Hypertension    CVA (cerebral vascular accident) (HCC) 09/10/2023   Stroke (cerebrum) (HCC) 09/10/2023   Pseudophakia, both eyes 10/13/2021   Dermatochalasis of both lower eyelids 10/13/2021   Moderate nonproliferative diabetic retinopathy of left eye (HCC) 10/10/2020   Moderate nonproliferative diabetic retinopathy of right eye (HCC) 08/29/2019   Early stage nonexudative age-related macular degeneration of both eyes 08/29/2019   Right posterior capsular opacification 08/29/2019   Left thyroid  nodule 07/06/2016   Benign neoplasm of skin of upper  limb, including shoulder 04/19/2012   Ventral hernia, recurrent 01/26/2011    ONSET DATE: 09/10/23  REFERRING DIAG:  I63.9 (ICD-10-CM) - Acute CVA (cerebrovascular accident) (HCC)    THERAPY DIAG:  Muscle weakness (generalized)  Difficulty in walking, not elsewhere classified  Dizziness and giddiness  Unsteadiness on feet  Hemiplegia and hemiparesis following cerebral infarction affecting right dominant side (HCC)  Rationale for Evaluation and Treatment: Rehabilitation  SUBJECTIVE:                                                                                                                                                                                             SUBJECTIVE STATEMENT: Only one episode of getting dizzy with positioning in bed but no other instances.    Pt accompanied by: self  PERTINENT HISTORY: 83 y.o. year old female with stroke in May 2025.  Left eye central retinal artery occlusion and multiple small scattered ischemic infarcts.  Cardioembolic source.  Presented to her ophthalmologist with painless loss of vision to the left eye.  Loop recorder placed 09/13/23.  TEE negative.  Vascular risk factors: HTN, HLD. Incidental 8 mm meningioma overlying the anterior right frontal lobe  PAIN:  Are you having pain? Yes: NPRS scale: 3-4/10 Pain location: posterior right knee Pain description: ache Aggravating factors: standing/walking Relieving factors:    PRECAUTIONS: None  RED FLAGS: None   WEIGHT BEARING RESTRICTIONS: No  FALLS: Has patient fallen in last 6 months? Yes. Number of falls 2 falls involving going down stairs; 1 recent fall on to grass 11/17/23  LIVING ENVIRONMENT: Lives with: lives with their son Lives in: House/apartment ground floor set-up Stairs: 6 steps to enter Has following equipment at home: Single point  cane and Walker - 4 wheeled  PLOF: Independent with household mobility with device and Independent with household mobility  without device  PATIENT GOALS: improve balance  OBJECTIVE:    TODAY'S TREATMENT: 12/22/23 Activity Comments  5xSTS 13 sec  M-CTSIB condition 2: mild-mod x 15 sec   Gait training -outdoors up/down ramp and curb supervision  LE PRE  -LAQ 3x10, 3# -hip add iso 3x10 -seated clamshells 3x10 red -standing hip flexion 3x10 red    Reactive balance          TODAY'S TREATMENT: 12/20/23 Activity Comments  Standing EC with 4WW in front  No LOB, but safest with 2 fingertip support on walker  TUG 21.38 sec with 4WW  review of HEP: - Seated Long Arc Quad   - Staggered Sit-to-Stand  - Heel Toe Raises with Counter Support - Standing Hip Abduction with Counter  - Staggered Stance Forward Backward Weight Shift with Counter Support  - Seated Horizontal Smooth Pursuit  - Seated Vertical Smooth Pursuit  - Seated VOR Cancellation   - Seated Horizontal Saccades  Review of HEP for carryover, benefit, and safety   Tendency for posterior LOB/retropulsion with STS, responding well to reaching towards visual target to improve form  Had to clarify smooth pursuit, VOR cancellation, and saccades- pt was not using a target and had difficulty maintaining pursuit and saccades horizontally.      M-CTSIB  Condition 1: Firm Surface, EO 30 Sec, Mild Sway  Condition 2: Firm Surface, EC 15 Sec, Moderate and LOB requiring min-mod A Sway  Condition 3: Foam Surface, EO - Sec, - Sway  Condition 4: Foam Surface, EC - Sec, - Sway     HOME EXERCISE PROGRAM Access Code: [email or web address] URL: [email or web address] Date: 12/20/2023 Prepared by: [facility] - Outpatient  Rehab - [facility]  Program Notes perform standing exercises at corner or counter for safety  Exercises - Seated Long Arc Quad  - 1 x daily - 7 x weekly - 3 sets - 10 reps - Staggered Sit-to-Stand  - 1 x daily - 7 x weekly - 3 sets - 5 reps - Heel Toe Raises with Counter Support  - 1 x daily - 7 x weekly - 3 sets - 10 reps -  Standing Hip Abduction with Counter Support  - 1 x daily - 7 x weekly - 3 sets - 10 reps - Staggered Stance Forward Backward Weight Shift with Counter Support  - 1 x daily - 7 x weekly - 3 sets - 10 reps - Seated Horizontal Smooth Pursuit  - 1 x daily - 7 x weekly - 2 sets - 30 sec hold - Seated Vertical Smooth Pursuit  - 1 x daily - 7 x weekly - 2 sets - 30 sec hold - Seated VOR Cancellation  - 1 x daily - 7 x weekly - 2 sets - 30 sec hold - Seated Horizontal Saccades  - 1 x daily - 7 x weekly - 2 sets - 30 sec hold - Standing Balance in Corner with Eyes Closed  - 1 x daily - 5 x weekly - 2 sets - 30 sec hold - Seated Hip Abduction with Resistance  - 2-3 x weekly - 3 sets - 10 reps - Marching with Resistance  - 2-3 x weekly - 3 sets - 10 reps     PATIENT EDUCATION: Education details: HEP update with detailed edu for safety; encouraged pt to use 4WW indoors/outdoors d/t falls risk as she reports walking without  it in the house  Person educated: Patient Education method: Explanation, Demonstration, Tactile cues, Verbal cues, and Handouts Education comprehension: verbalized understanding and returned demonstration   --------------------------------------------  VESTIBULAR ASSESSMENT 12/01/23:  GENERAL OBSERVATION: n/a   SYMPTOM BEHAVIOR:  Subjective history: Increased dizziness after acute new CVA on 11/26/23  Non-Vestibular symptoms: nausea/vomiting  Type of dizziness: Imbalance (Disequilibrium), Unsteady with head/body turns, and It feels like I'm moving.  Frequency: Comes and goes, maybe 3-5x in the day  Duration: Can last a couple of hours  Aggravating factors: Induced by motion: turning body quickly and turning head quickly  Relieving factors: medication and rest  Progression of symptoms: worse  OCULOMOTOR EXAM:  Ocular Alignment: normal  Ocular ROM: No Limitations  Spontaneous Nystagmus: absent  Gaze-Induced Nystagmus: absent  Smooth Pursuits: intact  Saccades:  slow  FRENZEL - FIXATION SUPRESSED: Did not assess  VESTIBULAR - OCULAR REFLEX:   Slow VOR: Positive Left  VOR Cancellation: Normal  Head-Impulse Test: HIT Right: negative HIT Left: positive  Dynamic Visual Acuity: TBA   POSITIONAL TESTING: did not assess   MOTION SENSITIVITY: did not assess   OTHOSTATICS: not done  DIAGNOSTIC FINDINGS:   MR BRAIN WO CONTRAST Result Date: 11/26/2023 CLINICAL DATA:  Neuro deficit, acute, stroke suspected EXAM: MRI HEAD WITHOUT CONTRAST TECHNIQUE: Multiplanar, multiecho pulse sequences of the brain and surrounding structures were obtained without intravenous contrast. COMPARISON:  MRI head Sep 10, 2023 FINDINGS: Brain: Punctate acute infarct in the right cerebellum (series five, image sixty one). No acute hemorrhage, hydrocephalus, extra-axial collection or mass lesion. Multiple small remote lacunar infarcts in the frontal and parietal white matter bilaterally. Patchy T2/FLAIR hyperintensities, compatible with chronic microvascular ischemic change. Vascular: Normal flow voids. Skull and upper cervical spine: Normal marrow signal. Sinuses/Orbits: Negative. IMPRESSION: Punctate acute infarct in the right cerebellum. Electronically Signed   By: Gilmore GORMAN Molt M.D.   On: 11/26/2023 03:24  09/10/23 MRI multiple small acute infarcts scattered within bilateral cerebral hemispheres, likely embolic in nature, 8 mm meningioma overlying anterior right frontal lobe, background atrophy and chronic small vessel ischemic disease - MRA no LVO or significant stenosis - Carotid Doppler unremarkable - Bilateral lower extremity ultrasound negative - 2D echo EF 60 to 65% - Loop recorder placed - TEE EF 60 to 65%, negative for PFO - LDL 30 - A1c 6.6  FROM INITIAL EVALUATION 11/17/23 (UNLESS OTHERWISE NOTED)  COGNITION: Overall cognitive status: Within functional limits for tasks assessed   SENSATION: WFL  COORDINATION: WFL Slight dysmetria finger to nose  LUE  EDEMA:  None noted  MUSCLE TONE: NT  MUSCLE LENGTH: WFL  DTRs:  NT  POSTURE: scoliosis w/ pelvic obliquity   LOWER EXTREMITY ROM:    WFL   LOWER EXTREMITY MMT:    MMT Right Eval Left Eval R/L 12/01/23  Hip flexion 5 4 3+/3+  Hip extension   3+/3+  Hip abduction 4 3- 3/3-  Hip adduction 4 4   Hip internal rotation     Hip external rotation     Knee flexion 5 4 5/4  Knee extension 5 4 5/4  Ankle dorsiflexion 5 5   Ankle plantarflexion     Ankle inversion     Ankle eversion     (Blank rows = not tested)  BED MOBILITY:  Not tested reports indep  TRANSFERS: Sit to stand: Complete Independence  Assistive device utilized: None     Chair to chair: Modified independence  Assistive device utilized: UE support  RAMP:  Not tested  CURB:  Findings: SBA-CGA  STAIRS: Findings: Comments: Supervision w/ BHR GAIT: Findings: Gait Characteristics: trendelenburg and Comments: left Modified indep level surfaces, CGA uneven  FUNCTIONAL TESTS:   11/17/23 5 times sit to stand: 18.78 sec Timed up and go (TUG): 19 sec w/ cane 10 meter walk test: 20 sec w/ cane = 1.64 ft/sec Berg Balance Test:42/56  M-CTSIB  Condition 1: Firm Surface, EO 30 Sec, Moderate Sway  Condition 2: Firm Surface, EC 6 Sec, Severe Sway  Condition 3: Foam Surface, EO  Sec,  Sway  Condition 4: Foam Surface, EC  Sec,  Sway    12/01/23 5 times sit to stand: 27.47 sec (R hand on knee, uses legs against bed) 10 meter walk test: 17.03 sec with  Lars Balance Test: 27/56 TUG: Deferred today due to pt L hip fatigued and starting to hurt after functional testing MCTSIB: Deferred today due to pt L hip pain                                                                                                                                  GOALS: Goals reviewed with patient? Yes  SHORT TERM GOALS: Target date: 12/08/2023    Patient will be independent in HEP to improve functional  outcomes Baseline: Goal status: IN PROGRESS  2.  Demo improved postural stability per mild-moderate sway x 30 sec condition 2 M-CTSIB to improve safety with ADL Baseline: severe x 6 sec; 15 sec mod sway/LOB requiring min A/tactile Goal status: NOT MET  3.  Demo improved BLE strength and reduce risk for falls per time 15 sec 5xSTS test Baseline: 18 sec; 13 sec Goal status: MET    LONG TERM GOALS: Target date: 01/12/2024    Demo improved left hip abduction strength to 3+/5 for improved single limb support and to minimize gait dysfunction Baseline: 3-/5, Trendelenberg Goal status: IN PROGRESS  2.  Modified independent stair ambulation for improved safety with entering/exiting home Baseline: supervision BHR Goal status: IN PROGRESS  3.  Reduce risk for falls per score 48/56 Berg Balance Test Baseline 11/17/23: 42/56 12/01/23: 21/56 Goal status: IN PROGRESS  4.  Reduce risk for falls per time 14 sec TUG test Baseline: 19 sec w/ cane; 21 sec with 4WW 12/20/23 Goal status: IN PROGRESS 12/20/23  5.  Modified independent with uneven surfaces and curb negotiation to improve safety in community Baseline: CGA Goal status: IN PROGRESS    ASSESSMENT:  CLINICAL IMPRESSION: Demo improved 5xSTS test 13 sec indicating low risk for falls. Dificulty with Romberg eyes closed. Focus on improved BLE strength. Static balance to facilitate postural stability and postural awareness to improve safety with ADL   OBJECTIVE IMPAIRMENTS: Abnormal gait, decreased activity tolerance, decreased balance, difficulty walking, decreased strength, and postural dysfunction.   ACTIVITY LIMITATIONS: carrying, lifting, standing, stairs, transfers, reach over head, and locomotion level  PARTICIPATION LIMITATIONS: meal prep, cleaning, laundry, interpersonal  relationship, driving, shopping, and community activity  PERSONAL FACTORS: Age, Time since onset of injury/illness/exacerbation, and 1-2 comorbidities: PMH are  also affecting patient's functional outcome.   REHAB POTENTIAL: Good  CLINICAL DECISION MAKING: Evolving/moderate complexity  EVALUATION COMPLEXITY: Moderate  PLAN:  PT FREQUENCY: 1-2x/week  PT DURATION: 6 weeks  PLANNED INTERVENTIONS: [id number]- PT Re-evaluation, 97750- Physical Performance Testing, 97110-Therapeutic exercises, 97530- Therapeutic activity, [id number]- Neuromuscular re-education, 97535- Self Care, [id number]- Manual therapy, [id number]- Gait training, [phone]- Orthotic Initial, [phone]- Canalith repositioning, [id number]- Aquatic Therapy, [phone]- Electrical stimulation (unattended), [phone] (1-2 muscles), [id number] (3+ muscles)- Dry Needling, Patient/Family education, Balance training, Stair training, Taping, Joint mobilization, Spinal mobilization, Vestibular training, Cryotherapy, and Moist heat  PLAN FOR NEXT SESSION: did not review STGs- need to check next session. Review oculomotor HEP, vestibular training, strengthening, balance   Louana Terrilyn Christians, PT, DPT 12/22/23 2:52 PM  [facility] at [facility] [address] [place], [address] [place], [place] [address] Phone # [phone] Fax # [phone]

## 2023-12-22 NOTE — Therapy (Addendum)
 OUTPATIENT SPEECH LANGUAGE PATHOLOGY TREATMENT/RECERTIFICATION   Patient Name: Annette Gross MRN: [id number] DOB:06-Nov-1940, 83 y.o., female Today's Date: 12/22/2023  PCP: Alys Bitters, PA REFERRING PROVIDER: Gayland Lauraine PARAS, NP  END OF SESSION:  End of Session - 12/22/23 1537     Visit Number 3    Number of Visits 5    Date for SLP Re-Evaluation 01/28/24    SLP Start Time 1535    SLP Stop Time  1615    SLP Time Calculation (min) 40 min    Activity Tolerance Patient tolerated treatment well          Past Medical History:  Diagnosis Date   Anemia    many yrs ago   Anxiety    Asthma    no recent issues   Cancer (HCC)    s/p thyroidectomy 2018   Complication of anesthesia    Cystitis, interstitial    Degenerative disc disease, lumbar    Degenerative joint disease    spinal stenosis Chronic low back pain   Depression    Diabetes mellitus    diet control.   Diverticulitis 2005   Diverticulosis 2005   Factor V deficiency (HCC)    Fibromyalgia    Fracture of right foot 1995   GERD (gastroesophageal reflux disease)    History of hiatal hernia    Hyperlipidemia    Hypertension    off med x 5 yrs.   IBS (irritable bowel syndrome)    Insomnia    Iron deficiency    Liver hemangioma 1999   MRI   Mononucleosis    Morton's neuroma    Left foot   Nuclear sclerotic cataract of left eye 08/29/2019   Peripheral neuropathy    Pneumonia    years 89 & 89.  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   CHOLECYSTECTOMY     COLONOSCOPY WITH PROPOFOL  N/A 09/02/2015   Procedure: COLONOSCOPY WITH PROPOFOL ;  Surgeon: Gladis MARLA Louder, MD;  Location: [facility];  Service: Endoscopy;  Laterality: N/A;   GANGLION CYST EXCISION      right   HEMORRHOID SURGERY     HERNIA REPAIR     repair was aimed at the [facility]   HIATAL HERNIA REPAIR     and nissen fundoplication   LAPAROSCOPIC ESOPHAGOGASTRIC FUNDOPLASTY     LAPAROSCOPIC INCISIONAL / UMBILICAL / VENTRAL HERNIA REPAIR     umbilical hernia   LOOP RECORDER INSERTION N/A 09/13/2023   Procedure: LOOP RECORDER INSERTION;  Surgeon: Leverne Charlies Helling, PA-C;  Location: [facility];  Service: Cardiovascular;  Laterality: N/A;   OOPHORECTOMY     left   RIGHT OOPHORECTOMY     '05-laparaoscopic   SHOULDER SURGERY Right    THYROIDECTOMY N/A 07/06/2016   Procedure: TOTAL THYROIDECTOMY;  Surgeon: Krystal Russell, MD;  Location: [facility];  Service: General;  Laterality: N/A;   TONSILLECTOMY     TOTAL THYROIDECTOMY  07/06/2016   TRANSESOPHAGEAL ECHOCARDIOGRAM (CATH LAB) N/A 09/13/2023   Procedure: TRANSESOPHAGEAL ECHOCARDIOGRAM;  Surgeon: Mona Vinie BROCKS, MD;  Location: [facility];  Service: Cardiovascular;  Laterality: N/A;   Patient Active Problem List   Diagnosis Date Noted  Hypertension    CVA (cerebral vascular accident) (HCC) 09/10/2023   Stroke (cerebrum) (HCC) 09/10/2023   Pseudophakia, both eyes 10/13/2021   Dermatochalasis of both lower eyelids 10/13/2021   Moderate nonproliferative diabetic retinopathy of left eye (HCC) 10/10/2020   Moderate nonproliferative diabetic retinopathy of right eye (HCC) 08/29/2019   Early stage nonexudative age-related macular degeneration of both eyes 08/29/2019   Right posterior capsular opacification 08/29/2019   Left thyroid  nodule 07/06/2016   Benign neoplasm of skin of upper limb, including shoulder 04/19/2012   Ventral hernia, recurrent 01/26/2011   Speech Therapy Progress Note  Dates of Reporting Period: 11/17/23 to present (two sessions)  Subjective Statement: Pt was seen for eval and one session. She was hospitalized from 11/26/23 to 11/29/23.  Objective: Pt learned how to complete Semantic Feature Analysis  today. She was encouraged to use description strategy if she experiences anomia.  Goal Update: See below. All goals remain as pt has been seen once.   Plan: See pt for three more ST sessions  Reason Skilled Services are Required: Not reached max potential yet.     ONSET DATE: 09/10/23  REFERRING DIAG:  I63.9 (ICD-10-CM) - Acute CVA (cerebrovascular accident) (HCC)    THERAPY DIAG:  Aphasia  Rationale for Evaluation and Treatment: Rehabilitation  SUBJECTIVE:   SUBJECTIVE STATEMENT: It seems like my talking is better.   Pt accompanied by: self  PERTINENT HISTORY:  MRI HEAD WO CONTRAST IMPRESSION: Punctate acute infarct in the right cerebellum.   From [facility] Born HISTORY OF PRESENT ILLNESS: Today 10/14/23 Here with Annette Gross, son's partner. They all live together. Continues to have large vision loss to the left eye, but can see crescent shaped light at the top that is getting larger. Saw ophthalmology.  She feels like right eye is fine. No other symptoms. BP was high today 162/81, then good on recheck 120/70 olmesartan. Continues on home Repatha. Goes to [place] for primary care. Mentions occasionally will have tingling to left side of face every few days, no longer than 30 minutes, only at V2 area radiates behind ear, no headache or dental pain. Mentions prior to stroke had some memory concerns remembering names or hard time finding her words. Would like to get occupational and speech therapy arranged. She doesn't drive since CVA. Other than driving, back to normal activities.   PAIN:  Are you having pain? No  FALLS: Has patient fallen in last 6 months?  See PT evaluation for details   PATIENT GOALS: Concerned about word finding incidents x2-3/week  OBJECTIVE:  Note: Objective measures were completed at Evaluation unless otherwise noted.  DIAGNOSTIC FINDINGS:  CT 09/10/23 IMPRESSION: No acute CT finding. Age related volume loss. Chronic small-vessel ischemic changes of the  white matter and basal ganglia.  MRI 09/10/23 IMPRESSION: 1. Multiple small acute infarcts scattered within the bilateral cerebral hemispheres, 10-15 in number and measuring up to 8 mm. Given involvement of multiple vascular territories, findings suggest sequelae of an embolic process. 2. 8 mm meningioma overlying the anterior right frontal lobe. The mass slightly indents the underlying brain parenchyma without underlying parenchymal edema. 3. Background parenchymal atrophy and chronic small vessel ischemic disease.   SLE - 09/12/23 Clinical Impression  Pt reports concerns with processing PTA but attributes this to age. She states she is typically independent but has assistance PRN from her son and his family. She scored 23/30 on the SLUMS (a 27 or above is considered WFL) characterized by deficits related to memory (retrieval > stroage), selective  attention, and executive functioning. She accurately recalled 3/5 novel words after a delay but states this is likely the most signficant change from her baseline. Pt had difficulty with a clock drawing task, repeating numbers and incorrectly placing the hands. Question effect of L vision deficits. Recommend ongoing SLP f/u on an OP basis, will sign off acutelty.   PATIENT REPORTED OUTCOME MEASURES (PROM): Communication Effectiveness Survey: provided to pt 12/16/23 but not pt forgot to return on 12/22/23. She will bring back 01/17/24.                                                                                                                            TREATMENT DATE:  Semantic Feature Analysis = SFA  12/22/23: Pt did not return PROM - told her to bring next session. Best compensation at this time is I just wait a bit and the word comes.  Znya reports anomia frequency continuing as 2-3 times a week. SLP reviewed previous session with pt and told her she should not perform SFA with proper nouns but SLP DID encourage pt to write/spell word x5, then  put the word into three different sentences if she cannot use a SFA with it. Today SLP and pt completed another SFA with SLP  (mod I necessary) reiterating to only use SFA with nouns and not proper nouns. Pt to see SLP in 4 weeks and likely either d/c or reduce frequency to once every two weeks.  12/16/23: PROM provided to return next session. Pt looks relatively unchanged with speech and language after rt cerebellar CVA on 11/26/23. Will recert today. SLP introduced SFA to pt today and collaborated with her to complete one with platter as the target word. Pt req'd initial cues for writing the target word in the sentences but was independent after that. In conversation with SLP during the session today neither anomia nor a verbal expression deficit were noted. Pt will continue with this process (SFA) if she experiences anomia with a noun. Homework provided.  11/17/23: n/a  PATIENT EDUCATION: Education details: See treatment date for more details Person educated: Patient Education method: Explanation Education comprehension: verbalized understanding   GOALS: Goals reviewed with patient? Yes  SHORT TERM GOALS:  = LONG TERM GOALS: Target date:  01/28/24 (due to pt unable to schedule last two weeks of ST until week of 01/17/24)  Pt will perform SFA with mod I in 2 sessions Baseline: 12/22/23 Goal status: INITIAL  2.  Pt will perform VNEST with mod I in 2 sessions Baseline:  Goal status: INITIAL  3.  Pt will report using one new compensatory strategy for anomia between two sessions Baseline: 12/22/23 Goal status: INITIAL  4.  Pt will score better with PROM than initial administration Baseline:  Goal status: INITIAL   ASSESSMENT:  CLINICAL IMPRESSION: Patient is a 83 y.o. F who was seen today for treatment of aphasia following CVA Sep 10, 2023. She has premorbid dx of ADD (since  1999-2000 pt stated). Pt mentions prior to CVA had some s/sx aphasia and memory disorder. She continues to  experience anomia x2-3 per week and says she just waits and the word comes which is an improvement in the last two weeks. Pt would cont to benefit from a short course of ST targeting a wider use of compensations, and teaching pt home activities to strengthen word finding and verbal expression (SFA and VNEST). She is managing medications and bills independently without errors reported to SLP. She does not have her calendar as she has recently moved (prior to CVA) and has not yet located it.   OBJECTIVE IMPAIRMENTS: include expressive language. These impairments are limiting patient from effectively communicating at home and in community. Factors affecting potential to achieve goals and functional outcome are premorbid dx of ADD.SABRA Patient will benefit from skilled SLP services to address above impairments and improve overall function.  REHAB POTENTIAL: Excellent  PLAN:  SLP FREQUENCY: 1x/week  SLP DURATION: 4 weeks total  PLANNED INTERVENTIONS: Language facilitation, Internal/external aids, Multimodal communication approach, SLP instruction and feedback, Compensatory strategies, Patient/family education, and [id number] Treatment of speech (30 or 45 min)     Delaila Nand, CCC-SLP 12/22/2023, 3:38 PM

## 2023-12-23 ENCOUNTER — Ambulatory Visit: Admitting: Occupational Therapy

## 2023-12-23 DIAGNOSIS — I69351 Hemiplegia and hemiparesis following cerebral infarction affecting right dominant side: Secondary | ICD-10-CM

## 2023-12-23 DIAGNOSIS — R41842 Visuospatial deficit: Secondary | ICD-10-CM | POA: Diagnosis not present

## 2023-12-23 DIAGNOSIS — R2681 Unsteadiness on feet: Secondary | ICD-10-CM | POA: Diagnosis not present

## 2023-12-23 DIAGNOSIS — R262 Difficulty in walking, not elsewhere classified: Secondary | ICD-10-CM | POA: Diagnosis not present

## 2023-12-23 DIAGNOSIS — R42 Dizziness and giddiness: Secondary | ICD-10-CM | POA: Diagnosis not present

## 2023-12-23 DIAGNOSIS — R4701 Aphasia: Secondary | ICD-10-CM | POA: Diagnosis not present

## 2023-12-23 DIAGNOSIS — M6281 Muscle weakness (generalized): Secondary | ICD-10-CM | POA: Diagnosis not present

## 2023-12-23 NOTE — Therapy (Signed)
 OUTPATIENT OCCUPATIONAL THERAPY NEURO  Treatment Note  Patient Name: Annette Gross MRN: [id number] DOB:[birth year], 83 y.o., female Today's Date: 12/23/2023  PCP: Dr. Sharie REFERRING PROVIDER: Gayland Lauraine PARAS, NP  END OF SESSION:  OT End of Session - 12/23/23 1327     Visit Number 3    Number of Visits 13    Date for OT Re-Evaluation 01/14/24    Authorization Type [employer] Medicare 2025    OT Start Time 1318    OT Stop Time 1400    OT Time Calculation (min) 42 min           Past Medical History:  Diagnosis Date   Anemia    many yrs ago   Anxiety    Asthma    no recent issues   Cancer (HCC)    s/p thyroidectomy 2018   Complication of anesthesia    Cystitis, interstitial    Degenerative disc disease, lumbar    Degenerative joint disease    spinal stenosis Chronic low back pain   Depression    Diabetes mellitus    diet control.   Diverticulitis 2005   Diverticulosis 2005   Factor V deficiency (HCC)    Fibromyalgia    Fracture of right foot 1995   GERD (gastroesophageal reflux disease)    History of hiatal hernia    Hyperlipidemia    Hypertension    off med x 5 yrs.   IBS (irritable bowel syndrome)    Insomnia    Iron deficiency    Liver hemangioma 1999   MRI   Mononucleosis    Morton's neuroma    Left foot   Nuclear sclerotic cataract of left eye 08/29/2019   Peripheral neuropathy    Pneumonia    years 89 & 89.  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   CHOLECYSTECTOMY     COLONOSCOPY WITH PROPOFOL  N/A 09/02/2015   Procedure: COLONOSCOPY WITH PROPOFOL ;  Surgeon: Gladis MARLA Louder, MD;  Location: [facility];  Service: Endoscopy;  Laterality: N/A;   GANGLION CYST EXCISION     right   HEMORRHOID SURGERY      HERNIA REPAIR     repair was aimed at the [facility]   HIATAL HERNIA REPAIR     and nissen fundoplication   LAPAROSCOPIC ESOPHAGOGASTRIC FUNDOPLASTY     LAPAROSCOPIC INCISIONAL / UMBILICAL / VENTRAL HERNIA REPAIR     umbilical hernia   LOOP RECORDER INSERTION N/A 09/13/2023   Procedure: LOOP RECORDER INSERTION;  Surgeon: Leverne Charlies Helling, PA-C;  Location: [facility];  Service: Cardiovascular;  Laterality: N/A;   OOPHORECTOMY     left   RIGHT OOPHORECTOMY     '05-laparaoscopic   SHOULDER SURGERY Right    THYROIDECTOMY N/A 07/06/2016   Procedure: TOTAL THYROIDECTOMY;  Surgeon: Krystal Russell, MD;  Location: [facility] OR;  Service: General;  Laterality: N/A;   TONSILLECTOMY     TOTAL THYROIDECTOMY  07/06/2016   TRANSESOPHAGEAL ECHOCARDIOGRAM (CATH LAB) N/A 09/13/2023   Procedure: TRANSESOPHAGEAL ECHOCARDIOGRAM;  Surgeon: Mona Vinie BROCKS, MD;  Location: [facility];  Service: Cardiovascular;  Laterality: N/A;   Patient Active Problem List   Diagnosis Date Noted  Hypertension    CVA (cerebral vascular accident) (HCC) 09/10/2023   Stroke (cerebrum) (HCC) 09/10/2023   Pseudophakia, both eyes 10/13/2021   Dermatochalasis of both lower eyelids 10/13/2021   Moderate nonproliferative diabetic retinopathy of left eye (HCC) 10/10/2020   Moderate nonproliferative diabetic retinopathy of right eye (HCC) 08/29/2019   Early stage nonexudative age-related macular degeneration of both eyes 08/29/2019   Right posterior capsular opacification 08/29/2019   Left thyroid  nodule 07/06/2016   Benign neoplasm of skin of upper limb, including shoulder 04/19/2012   Ventral hernia, recurrent 01/26/2011    ONSET DATE: 09/10/23  REFERRING DIAG: I63.9 (ICD-10-CM) - Acute CVA (cerebrovascular accident) (HCC)  THERAPY DIAG:  Visuospatial deficit  Hemiplegia and hemiparesis following cerebral infarction affecting right dominant side (HCC)  Rationale for Evaluation and Treatment:  Rehabilitation  SUBJECTIVE:   SUBJECTIVE STATEMENT: Pt reports that things have been going really well.  Has not noticed any changes in her vision. Pt accompanied by: self (dropped off by friend)  PERTINENT HISTORY: Presented to the ER 09/10/2023 after waking with impaired vision to her left eye, she could only see a crescent shaped area at the top of her field of vision. A right lower quadrantanopsia was also noted. She went through her ophthalmologist was told she had a central retinal artery occlusion and was sent to the ER. She was admitted for painless loss of vision to the central field of the left eye. On MRI found to have multiple embolic appearing ischemic strokes.  PMH of DM II, anxiety, cancer, DJD, DM, fibromyalgia, HTN, HLD, IBS  PRECAUTIONS: Fall  WEIGHT BEARING RESTRICTIONS: No  PAIN:  Are you having pain? Yes: NPRS scale: 2 Pain location: R knee and hip - from fall this morning Pain description: sore  FALLS: Has patient fallen in last 6 months? Yes. Number of falls 2 - one fall down the basement stairs and 1 this morning getting down the steps when walking to the car  LIVING ENVIRONMENT: Lives with: lives with their family (son and his partner) Lives in: House/apartment Stairs: Yes: Internal: there are stairs to the basement, but does not need to go down those steps; and External: 6 steps; bilateral but cannot reach both Has following equipment at home: Single point cane, Walker - 4 wheeled, and shower chair  PLOF: Independent, Independent with basic ADLs, and Leisure: enjoys drawing and painting  PATIENT GOALS: to be aware of vision, handwriting  OBJECTIVE:  Note: Objective measures were completed at Evaluation unless otherwise noted.  HAND DOMINANCE: Ambidextrous - writes with R hand, eats with L hand  ADLs: Transfers/ambulation related to ADLs: Uses Rollator in the home to transport items, SPC in the community Eating: reports decreased depth perception when  pouring drink into cup, reports having to look at food when scooping Grooming: Mod I UB Dressing: Mod I - does have difficulty with bra but is able to complete without assist LB Dressing: Mod I Toileting: Mod I Bathing: Mod I  Tub Shower transfers: Mod I Equipment: Shower seat without back  IADLs: Light housekeeping: helps with the dishes, emptying dishwasher, sweeping, folding and puttying away laundry Meal Prep: simple meal prep with sandwiches and cereal, is not primary cook now that she lives with family of 5 Community mobility: not cleared to resume driving s/p CVA Medication management: plans to get a pill box to organize all meds Handwriting: 100% legible and 17.5 sec for PPT #1 ([place]  MOBILITY STATUS: Hx of falls, uses  Rollator in home occasionally and SPC in community  POSTURE COMMENTS:  Scoliosis with pelvic obliquity  ACTIVITY TOLERANCE: Activity tolerance: diminished  FUNCTIONAL OUTCOME MEASURES: 9 hole peg test: 9 Hole Peg test: Right: 34.72 sec; Left: 30.94 sec (pt asking about next step after filling peg board - ?difficulty with 2 step commands)  PSFS: TBD  UPPER EXTREMITY ROM:    Active ROM Right eval Left eval  Shoulder flexion 142 112 ( reports she is awaiting surgery)  Shoulder abduction    Shoulder adduction    Shoulder extension    Shoulder internal rotation [facility]  Shoulder external rotation [facility]  Elbow flexion [facility] WFL  Elbow extension [facility] WFL  Wrist flexion [name] WFL  Wrist extension [facility] WFL  Wrist ulnar deviation    Wrist radial deviation    Wrist pronation    Wrist supination    (Blank rows = not tested)  UPPER EXTREMITY MMT:     MMT Right eval Left eval  Shoulder flexion 4- 3+(reports awaiting surgery)  Shoulder abduction    Shoulder adduction    Shoulder extension    Shoulder internal rotation    Shoulder external rotation    Middle trapezius    Lower trapezius    Elbow flexion    Elbow extension     Wrist flexion    Wrist extension    Wrist ulnar deviation    Wrist radial deviation    Wrist pronation    Wrist supination    (Blank rows = not tested)  COORDINATION: Finger Nose Finger test: [facility] 9 Hole Peg test: Right: 34.72 sec; Left: 30.94 sec (pt asking about next step after filling peg board - ?difficulty with 2 step commands) Box and Blocks:  Right 41 blocks, Left 43 blocks (hit barrier x3 with R, x2 with L)  12/16/23 9 hole peg test: Right: 31.56 sec; Left: 29.25   SENSATION: WFL   COGNITION: Overall cognitive status: Pt reports thinking skills are not quite the same (reports difficulty with crossword puzzles than before) and reports increased time to think through something.  VISION: Subjective report: vision is improving Baseline vision: Wears glasses for reading only Visual history: cataracts (s/p removal)  VISION ASSESSMENT: Impaired Ocular ROM: WFL Gaze preference/alignment: WDL Tracking/Visual pursuits: Able to track stimulus in all quads without difficulty Saccades: additional eye shifts occurred during testing Visual Fields: Right visual field deficits Depth perception: pt reports decreased depth perception, did not recognize stimulus until nearly at midline on L side Some decreased recognition of simulus on L vision field with confrontation testing  Patient has difficulty with following activities due to following visual impairments: embroidering, pouring drink into cup, handwriting                                                                                                                            TREATMENT DATE:  12/23/23 Engaged in small peg board pattern replication with use of key to identify correlating  colors, challenging alternating attention, visual perception and attention to L environment as picture placed in L visual field.  Pt initially scanning in linear fashion, however about halfway through pt getting off line requiring cues to problem  solve errors.  OT educated on use of bookmark for line follow to decrease stimulus and cues to fully scan to L to ensure all visual information.  Pt omitting 2 pegs on far left initially, but with cues to scan to edge of page and with bookmark pt with no additional errors.  Educated on functional carryover of task implications with systematic process to minimize onset of errors.  Pt reports having some errors when reading or following recipes. Vision: engaged in scanning nonsensical words from L to R to identify letters in alphabetical order.  OT placed high lighter line on L side of passage to facilitate scanning fully to L before beginning to scan linearly. Noted pt to skip a line towards the end, but with increased time able to recognize error and correct.   Spot It: engaged in visual scanning task challenging pt to locate 3 cards with matching icon between field of 9 cards with mixed icons on each.  Pt requiring min increased time to scan cards to locate matching icon, especially recognizing cards on L.   12/16/23 Bell cancellation test: pt locating 31/35 bells in 2:44.  Pt omitting bells primarily in middle of picture and 1 to far, lower right.  Upon review, pt stating that she had a hard time seeing the missing ones due to too close to another object and disappearing into mixed background.   Trail making: Trail making A: 1:37. Pt picking up pen x2 and requiring increased time with sequencing after 14.  Attempted Trail B, stopping after 2:40 at [facility] after multiple omitted letters and numbers.  Pt demonstrating difficulty with alternating attention between numbers and letters.  Completed additional attempt with alternative printout and pt wearing her reading glasses.  Pt completing in 3:28 with one error, skipping number 2.  OT educating on functional carryover of alternating attention and educating on decreasing extra stimulus and focus on one task at a time due to challenges present during alternating  attention task this session.   9 hole peg test: Right: 31.56 sec; Left: 29.25.  Pt with no issues with recall of 2 step directions and no change in coordination s/p new CVA Vision: OT educated pt on lighthouse strategy with scanning head and eyes to Left during mobility (particularly in unfamiliar and/or cluttered environment), having person walk on L to faciltiate scanning to L, setting up grooming items to L to increase scanning to L.  OT also recommending use of hand placement or other anchor on L side of paper or page when reading to fully scan to Left.  Recommended use of bookmark for increased ease with line follow as pt with difficulty with scanning during Bell cancellation task due to increased stimulation.  Provided with handout.    11/17/23 Educated on depth perception exercises, provided with handout (see pt instructions).     PATIENT EDUCATION: Education details: vision and alternating attention Person educated: Patient Education method: Explanation, Verbal cues, and Handouts Education comprehension: verbalized understanding and needs further education  HOME EXERCISE PROGRAM: 11/17/23 - depth perception activities (see pt instructions)   GOALS: Goals reviewed with patient? Yes  SHORT TERM GOALS: Target date: 12/17/23  Pt will be independent with vision HEP with use of handouts. Baseline: new to OPOT Goal status: in progress  2.  Pt will be independent with coordination HEP with use of handouts. Baseline: new to OPOT Goal status:  in progress  3.  Pt will verbalize understanding of compensatory visual scanning strategies to increase attention to L visual field. Baseline: new to OPOT Goal status:  in progress  4.  Pt will improve visual scanning/use compensatory strategies to perform cancellation task with 75% accuracy.  Baseline: 31/35 on Bell cancellation task Goal status:  in progress   LONG TERM GOALS: Target date: 01/14/24  Pt will demonstrate improved fine  motor coordination for ADLs as evidenced by decreasing 9 hole peg test score for RUE by 3 secs Baseline: Right: 34.72 sec; Left: 30.94 sec  12/16/23: Right: 31.56 sec Goal status: MET  2.  Pt will navigate a moderately busy environment, completing dual task activity and/or following multi-step commands with 90% accuracy. Baseline: difficulty with 2 step commands Goal status: INITIAL  3.  Pt will demonstrate improved coordination to allow for improved speed with handwriting to completing simple sentence in <15 seconds without compromising 100% legibility. Baseline: 17.5 sec Goal status: INITIAL  4.  Pt will verbalize and/or demonstrate understanding of energy conservation strategies to increase engagement in and safety with ADLs and IADLs.  Baseline: new to OPOT Goal status: INITIAL   ASSESSMENT:  CLINICAL IMPRESSION: Patient is a 83 y.o. female who was seen today for occupational therapy treatment session with focus on vision and coordination s/p CVA. Pt continues to demonstrate L visual field impairment and difficulties with skipping lines during peg board and letter scanning activity.  Pt receptive to education on use of anchor to Left to increase attention to left environment and use of bookmark for line follow to reduce skipping lines.  Pt will continue to benefit from skilled occupational therapy services to address coordination, pain management, balance, GM/FM control, cognition, safety awareness, introduction of compensatory strategies/AE prn, visual-perception, and implementation of an HEP to improve participation and safety during ADLs and IADLs.    PERFORMANCE DEFICITS: in functional skills including ADLs, IADLs, coordination, pain, Fine motor control, Gross motor control, balance, body mechanics, endurance, decreased knowledge of precautions, decreased knowledge of use of DME, vision, and UE functional use, cognitive skills including memory and safety awareness, and psychosocial  skills including coping strategies, environmental adaptation, and routines and behaviors.     PLAN:  OT FREQUENCY: 1-2x/week  OT DURATION: 6 weeks  PLANNED INTERVENTIONS: 97168 OT Re-evaluation, 97535 self care/ADL training, 02889 therapeutic exercise, 97530 therapeutic activity, 97112 neuromuscular re-education, 97140 manual therapy, balance training, functional mobility training, visual/perceptual remediation/compensation, psychosocial skills training, energy conservation, coping strategies training, patient/family education, and DME and/or AE instructions  RECOMMENDED OTHER SERVICES: NA  CONSULTED AND AGREED WITH PLAN OF CARE: Patient  PLAN FOR NEXT SESSION:  further explore depth perception and L attention during functional and table top tasks, 2-3 step directions for recall, educate on energy conservation.  Check handwriting.   KAYLENE DOMINO, OTR/L 12/23/2023, 1:28 PM  [facility] [address] [place], [address] [place], [place] [address] Phone # [phone] Fax # [phone]

## 2024-01-02 DIAGNOSIS — M810 Age-related osteoporosis without current pathological fracture: Secondary | ICD-10-CM | POA: Diagnosis not present

## 2024-01-02 DIAGNOSIS — E782 Mixed hyperlipidemia: Secondary | ICD-10-CM | POA: Diagnosis not present

## 2024-01-02 DIAGNOSIS — J4530 Mild persistent asthma, uncomplicated: Secondary | ICD-10-CM | POA: Diagnosis not present

## 2024-01-02 DIAGNOSIS — E890 Postprocedural hypothyroidism: Secondary | ICD-10-CM | POA: Diagnosis not present

## 2024-01-03 ENCOUNTER — Ambulatory Visit (INDEPENDENT_AMBULATORY_CARE_PROVIDER_SITE_OTHER)

## 2024-01-03 DIAGNOSIS — I639 Cerebral infarction, unspecified: Secondary | ICD-10-CM

## 2024-01-05 LAB — CUP PACEART REMOTE DEVICE CHECK
Date Time Interrogation Session: 20250830100346
Implantable Pulse Generator Implant Date: 20250512

## 2024-01-10 ENCOUNTER — Encounter: Payer: Self-pay | Admitting: Podiatry

## 2024-01-10 ENCOUNTER — Ambulatory Visit: Admitting: Podiatry

## 2024-01-10 DIAGNOSIS — B351 Tinea unguium: Secondary | ICD-10-CM

## 2024-01-10 DIAGNOSIS — E1142 Type 2 diabetes mellitus with diabetic polyneuropathy: Secondary | ICD-10-CM | POA: Diagnosis not present

## 2024-01-10 DIAGNOSIS — M79675 Pain in left toe(s): Secondary | ICD-10-CM

## 2024-01-10 DIAGNOSIS — M79674 Pain in right toe(s): Secondary | ICD-10-CM | POA: Diagnosis not present

## 2024-01-10 NOTE — Progress Notes (Signed)
  Subjective:  Patient ID: Annette Gross, female    DOB: 03-Mar-1941,   MRN: [id number]  No chief complaint on file.   83 y.o. female presents for concern of thickened elongated and painful nails that are difficult to trim. Requesting to have them trimmed today. Relates burning and tingling in their feet. Patient is diabetic and last A1c was  Lab Results  Component Value Date   HGBA1C 6.6 (H) 09/11/2023   .   PCP:  Husain, Karrar, MD    . Denies any other pedal complaints. Denies n/v/f/c.   Past Medical History:  Diagnosis Date   Anemia    many yrs ago   Anxiety    Asthma    no recent issues   Cancer [facility])    s/p thyroidectomy 2018   Complication of anesthesia    Cystitis, interstitial    Degenerative disc disease, lumbar    Degenerative joint disease    spinal stenosis Chronic low back pain   Depression    Diabetes mellitus    diet control.   Diverticulitis 2005   Diverticulosis 2005   Factor V deficiency (HCC)    Fibromyalgia    Fracture of right foot 1995   GERD (gastroesophageal reflux disease)    History of hiatal hernia    Hyperlipidemia    Hypertension    off med x 5 yrs.   IBS (irritable bowel syndrome)    Insomnia    Iron deficiency    Liver hemangioma 1999   MRI   Mononucleosis    Morton's neuroma    Left foot   Nuclear sclerotic cataract of left eye 08/29/2019   Peripheral neuropathy    Pneumonia    years 89 & 89.  None since   PONV (postoperative nausea and vomiting)    nausea no vomiting   Rectocele    Transfusion history    child x2, many yrs ago after childbirthhemorrhage   Umbilical hernia 2012    Objective:  Physical Exam: Vascular: DP/PT pulses 2/4 bilateral. CFT <3 seconds. Absent hair growth on digits. Edema noted to bilateral lower extremities. Xerosis noted bilaterally.  Skin. No lacerations or abrasions bilateral feet. Nails 1-5 bilateral  are thickened discolored and elongated with subungual debris.  Musculoskeletal: MMT 5/5  bilateral lower extremities in DF, PF, Inversion and Eversion. Deceased ROM in DF of ankle joint. HAV deformity noted bilateral with hammered second digits and crossover of second digit on left. Pes planus noted bilateral as well.  Neurological: Sensation intact to light touch. Protective sensation diminished bilateral.    Assessment:   1. Pain due to onychomycosis of toenails of both feet   2. Type 2 diabetes mellitus with peripheral neuropathy (HCC)      Plan:  Patient was evaluated and treated and all questions answered. -Discussed and educated patient on diabetic foot care, especially with  regards to the vascular, neurological and musculoskeletal systems.  -Stressed the importance of good glycemic control and the detriment of not  controlling glucose levels in relation to the foot. -Discussed supportive shoes at all times and checking feet regularly.  -Mechanically debrided all nails 1-5 bilateral using sterile nail nipper and filed with dremel without incident  -Answered all patient questions -Patient to return  in 3 months for at risk foot care -Patient advised to call the office if any problems or questions arise in the meantime.   Asberry Failing, DPM

## 2024-01-11 NOTE — Progress Notes (Signed)
 Remote Loop Recorder Transmission

## 2024-01-14 DIAGNOSIS — M1711 Unilateral primary osteoarthritis, right knee: Secondary | ICD-10-CM | POA: Diagnosis not present

## 2024-01-17 DIAGNOSIS — H211X2 Other vascular disorders of iris and ciliary body, left eye: Secondary | ICD-10-CM | POA: Diagnosis not present

## 2024-01-17 DIAGNOSIS — H3412 Central retinal artery occlusion, left eye: Secondary | ICD-10-CM | POA: Diagnosis not present

## 2024-01-17 DIAGNOSIS — H353132 Nonexudative age-related macular degeneration, bilateral, intermediate dry stage: Secondary | ICD-10-CM | POA: Diagnosis not present

## 2024-01-17 DIAGNOSIS — H3522 Other non-diabetic proliferative retinopathy, left eye: Secondary | ICD-10-CM | POA: Diagnosis not present

## 2024-01-17 DIAGNOSIS — E113391 Type 2 diabetes mellitus with moderate nonproliferative diabetic retinopathy without macular edema, right eye: Secondary | ICD-10-CM | POA: Diagnosis not present

## 2024-01-17 DIAGNOSIS — E113392 Type 2 diabetes mellitus with moderate nonproliferative diabetic retinopathy without macular edema, left eye: Secondary | ICD-10-CM | POA: Diagnosis not present

## 2024-01-17 DIAGNOSIS — Z9889 Other specified postprocedural states: Secondary | ICD-10-CM | POA: Diagnosis not present

## 2024-01-19 ENCOUNTER — Ambulatory Visit: Attending: Neurology | Admitting: Occupational Therapy

## 2024-01-19 ENCOUNTER — Ambulatory Visit: Admitting: Physical Therapy

## 2024-01-19 ENCOUNTER — Ambulatory Visit

## 2024-01-19 ENCOUNTER — Encounter: Payer: Self-pay | Admitting: Physical Therapy

## 2024-01-19 DIAGNOSIS — R2689 Other abnormalities of gait and mobility: Secondary | ICD-10-CM | POA: Insufficient documentation

## 2024-01-19 DIAGNOSIS — R2681 Unsteadiness on feet: Secondary | ICD-10-CM | POA: Insufficient documentation

## 2024-01-19 DIAGNOSIS — I69351 Hemiplegia and hemiparesis following cerebral infarction affecting right dominant side: Secondary | ICD-10-CM | POA: Insufficient documentation

## 2024-01-19 DIAGNOSIS — M6281 Muscle weakness (generalized): Secondary | ICD-10-CM

## 2024-01-19 DIAGNOSIS — R4701 Aphasia: Secondary | ICD-10-CM | POA: Insufficient documentation

## 2024-01-19 DIAGNOSIS — R41842 Visuospatial deficit: Secondary | ICD-10-CM | POA: Insufficient documentation

## 2024-01-19 NOTE — Patient Instructions (Signed)
 Annette Gross

## 2024-01-19 NOTE — Therapy (Signed)
 OUTPATIENT OCCUPATIONAL THERAPY NEURO  Treatment Note  Patient Name: Annette Gross MRN: [id number] DOB:[birth year], 83 y.o., female Today's Date: 01/19/2024  PCP: Dr. Sharie REFERRING PROVIDER: Gayland Lauraine PARAS, NP  END OF SESSION:  OT End of Session - 01/19/24 1537     Visit Number 4    Number of Visits 13    Date for OT Re-Evaluation 03/03/24    Authorization Type [employer] Medicare 2025    OT Start Time 1533    OT Stop Time 1616    OT Time Calculation (min) 43 min            Past Medical History:  Diagnosis Date   Anemia    many yrs ago   Anxiety    Asthma    no recent issues   Cancer (HCC)    s/p thyroidectomy 2018   Complication of anesthesia    Cystitis, interstitial    Degenerative disc disease, lumbar    Degenerative joint disease    spinal stenosis Chronic low back pain   Depression    Diabetes mellitus    diet control.   Diverticulitis 2005   Diverticulosis 2005   Factor V deficiency (HCC)    Fibromyalgia    Fracture of right foot 1995   GERD (gastroesophageal reflux disease)    History of hiatal hernia    Hyperlipidemia    Hypertension    off med x 5 yrs.   IBS (irritable bowel syndrome)    Insomnia    Iron deficiency    Liver hemangioma 1999   MRI   Mononucleosis    Morton's neuroma    Left foot   Nuclear sclerotic cataract of left eye 08/29/2019   Peripheral neuropathy    Pneumonia    years 89 & 89.  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   CHOLECYSTECTOMY     COLONOSCOPY WITH PROPOFOL  N/A 09/02/2015   Procedure: COLONOSCOPY WITH PROPOFOL ;  Surgeon: Gladis MARLA Louder, MD;  Location: [facility];  Service: Endoscopy;  Laterality: N/A;   GANGLION CYST EXCISION     right   HEMORRHOID  SURGERY     HERNIA REPAIR     repair was aimed at the [facility]   HIATAL HERNIA REPAIR     and nissen fundoplication   LAPAROSCOPIC ESOPHAGOGASTRIC FUNDOPLASTY     LAPAROSCOPIC INCISIONAL / UMBILICAL / VENTRAL HERNIA REPAIR     umbilical hernia   LOOP RECORDER INSERTION N/A 09/13/2023   Procedure: LOOP RECORDER INSERTION;  Surgeon: Leverne Charlies Helling, PA-C;  Location: [facility];  Service: Cardiovascular;  Laterality: N/A;   OOPHORECTOMY     left   RIGHT OOPHORECTOMY     '05-laparaoscopic   SHOULDER SURGERY Right    THYROIDECTOMY N/A 07/06/2016   Procedure: TOTAL THYROIDECTOMY;  Surgeon: Krystal Russell, MD;  Location: [facility];  Service: General;  Laterality: N/A;   TONSILLECTOMY     TOTAL THYROIDECTOMY  07/06/2016   TRANSESOPHAGEAL ECHOCARDIOGRAM (CATH LAB) N/A 09/13/2023   Procedure: TRANSESOPHAGEAL ECHOCARDIOGRAM;  Surgeon: Mona Vinie BROCKS, MD;  Location: [facility];  Service: Cardiovascular;  Laterality: N/A;   Patient Active Problem List   Diagnosis Date Noted  Hypertension    CVA (cerebral vascular accident) (HCC) 09/10/2023   Stroke (cerebrum) (HCC) 09/10/2023   Pseudophakia, both eyes 10/13/2021   Dermatochalasis of both lower eyelids 10/13/2021   Moderate nonproliferative diabetic retinopathy of left eye (HCC) 10/10/2020   Moderate nonproliferative diabetic retinopathy of right eye (HCC) 08/29/2019   Early stage nonexudative age-related macular degeneration of both eyes 08/29/2019   Right posterior capsular opacification 08/29/2019   Left thyroid  nodule 07/06/2016   Benign neoplasm of skin of upper limb, including shoulder 04/19/2012   Ventral hernia, recurrent 01/26/2011    ONSET DATE: 09/10/23  REFERRING DIAG: I63.9 (ICD-10-CM) - Acute CVA (cerebrovascular accident) (HCC)  THERAPY DIAG:  Visuospatial deficit  Hemiplegia and hemiparesis following cerebral infarction affecting right dominant side (HCC)  Muscle weakness (generalized)  Rationale for  Evaluation and Treatment: Rehabilitation  SUBJECTIVE:   SUBJECTIVE STATEMENT: Pt reports that things have been going pretty well.  Pt reports that she has the occasional dizziness that starts in the morning, but does not seem to impact her exercises.    Pt reports having difficulty with coordination and eye/hand coordination.   Pt accompanied by: self (dropped off by friend)  PERTINENT HISTORY: Presented to the ER 09/10/2023 after waking with impaired vision to her left eye, she could only see a crescent shaped area at the top of her field of vision. A right lower quadrantanopsia was also noted. She went through her ophthalmologist was told she had a central retinal artery occlusion and was sent to the ER. She was admitted for painless loss of vision to the central field of the left eye. On MRI found to have multiple embolic appearing ischemic strokes.  PMH of DM II, anxiety, cancer, DJD, DM, fibromyalgia, HTN, HLD, IBS  PRECAUTIONS: Fall  WEIGHT BEARING RESTRICTIONS: No  PAIN:  Are you having pain? Yes: NPRS scale: 2 Pain location: R knee  - had a shot in it last week Pain description: aching  FALLS: Has patient fallen in last 6 months? Yes. Number of falls 2 - one fall down the basement stairs and 1 this morning getting down the steps when walking to the car  LIVING ENVIRONMENT: Lives with: lives with their family (son and his partner) Lives in: House/apartment Stairs: Yes: Internal: there are stairs to the basement, but does not need to go down those steps; and External: 6 steps; bilateral but cannot reach both Has following equipment at home: Single point cane, Walker - 4 wheeled, and shower chair  PLOF: Independent, Independent with basic ADLs, and Leisure: enjoys drawing and painting  PATIENT GOALS: to be aware of vision, handwriting  OBJECTIVE:  Note: Objective measures were completed at Evaluation unless otherwise noted.  HAND DOMINANCE: Ambidextrous - writes with R hand,  eats with L hand  ADLs: Transfers/ambulation related to ADLs: Uses Rollator in the home to transport items, SPC in the community Eating: reports decreased depth perception when pouring drink into cup, reports having to look at food when scooping Grooming: Mod I UB Dressing: Mod I - does have difficulty with bra but is able to complete without assist LB Dressing: Mod I Toileting: Mod I Bathing: Mod I  Tub Shower transfers: Mod I Equipment: Shower seat without back  IADLs: Light housekeeping: helps with the dishes, emptying dishwasher, sweeping, folding and puttying away laundry Meal Prep: simple meal prep with sandwiches and cereal, is not primary cook now that she lives with family of 5 Community mobility: not cleared to resume driving s/p CVA Medication  management: plans to get a pill box to organize all meds Handwriting: 100% legible and 17.5 sec for PPT #1 ([place]  MOBILITY STATUS: Hx of falls, uses Rollator in home occasionally and SPC in community  POSTURE COMMENTS:  Scoliosis with pelvic obliquity  ACTIVITY TOLERANCE: Activity tolerance: diminished  FUNCTIONAL OUTCOME MEASURES: 9 hole peg test: 9 Hole Peg test: Right: 34.72 sec; Left: 30.94 sec (pt asking about next step after filling peg board - ?difficulty with 2 step commands)  PSFS: TBD  UPPER EXTREMITY ROM:    Active ROM Right eval Left eval  Shoulder flexion 142 112 ( reports she is awaiting surgery)  Shoulder abduction    Shoulder adduction    Shoulder extension    Shoulder internal rotation [facility]  Shoulder external rotation [facility]  Elbow flexion [facility] WFL  Elbow extension [facility] WFL  Wrist flexion [facility] WFL  Wrist extension [facility] WFL  Wrist ulnar deviation    Wrist radial deviation    Wrist pronation    Wrist supination    (Blank rows = not tested)  UPPER EXTREMITY MMT:     MMT Right eval Left eval  Shoulder flexion 4- 3+(reports awaiting surgery)  Shoulder abduction     Shoulder adduction    Shoulder extension    Shoulder internal rotation    Shoulder external rotation    Middle trapezius    Lower trapezius    Elbow flexion    Elbow extension    Wrist flexion    Wrist extension    Wrist ulnar deviation    Wrist radial deviation    Wrist pronation    Wrist supination    (Blank rows = not tested)  COORDINATION: Finger Nose Finger test: [facility] 9 Hole Peg test: Right: 34.72 sec; Left: 30.94 sec (pt asking about next step after filling peg board - ?difficulty with 2 step commands) Box and Blocks:  Right 41 blocks, Left 43 blocks (hit barrier x3 with R, x2 with L)  12/16/23 9 hole peg test: Right: 31.56 sec; Left: 29.25   SENSATION: WFL   COGNITION: Overall cognitive status: Pt reports thinking skills are not quite the same (reports difficulty with crossword puzzles than before) and reports increased time to think through something.  VISION: Subjective report: vision is improving Baseline vision: Wears glasses for reading only Visual history: cataracts (s/p removal)  VISION ASSESSMENT: Impaired Ocular ROM: WFL Gaze preference/alignment: WDL Tracking/Visual pursuits: Able to track stimulus in all quads without difficulty Saccades: additional eye shifts occurred during testing Visual Fields: Right visual field deficits Depth perception: pt reports decreased depth perception, did not recognize stimulus until nearly at midline on L side Some decreased recognition of simulus on L vision field with confrontation testing  Patient has difficulty with following activities due to following visual impairments: embroidering, pouring drink into cup, handwriting  TREATMENT DATE:  01/19/24 Bell cancellation: pt locating 31 of 35 bells in 3:13.  Pt demonstrating good linear scanning, however still omitting 4.Pt omitting bells primarily  in middle of picture.  Upon review, pt stating that she had a hard time seeing the missing ones and reports that when she is reading that she will occasionally miss the middle. Trail making: completed Trail A in 52.72 seconds with no errors.  Completed Trail B in 2:14 with no errors.  Pt picking up pen intermittently but no errors or doubling back.   Handwriting: Pt writing name and simple sentence with 100% legibility.  Pt reports that it is worse than it used to be and feels that it is her coordination that is impacting it. Pegs: Right: 30.0 sec and Left: 29.5 sec.  Discussed norms with pt expressing desire to continue to work with coordination. Coordination: provided pt with coordination HEP (see pt instructions) and providing demonstration of each and modifications to card tasks to increase cognitive challenge and alternating attention.       12/23/23 Engaged in small peg board pattern replication with use of key to identify correlating colors, challenging alternating attention, visual perception and attention to L environment as picture placed in L visual field.  Pt initially scanning in linear fashion, however about halfway through pt getting off line requiring cues to problem solve errors.  OT educated on use of bookmark for line follow to decrease stimulus and cues to fully scan to L to ensure all visual information.  Pt omitting 2 pegs on far left initially, but with cues to scan to edge of page and with bookmark pt with no additional errors.  Educated on functional carryover of task implications with systematic process to minimize onset of errors.  Pt reports having some errors when reading or following recipes. Vision: engaged in scanning nonsensical words from L to R to identify letters in alphabetical order.  OT placed high lighter line on L side of passage to facilitate scanning fully to L before beginning to scan linearly. Noted pt to skip a line towards the end, but with increased time able  to recognize error and correct.   Spot It: engaged in visual scanning task challenging pt to locate 3 cards with matching icon between field of 9 cards with mixed icons on each.  Pt requiring min increased time to scan cards to locate matching icon, especially recognizing cards on L.   12/16/23 Bell cancellation test: pt locating 31/35 bells in 2:44.  Pt omitting bells primarily in middle of picture and 1 to far, lower right.  Upon review, pt stating that she had a hard time seeing the missing ones due to too close to another object and disappearing into mixed background.   Trail making: Trail making A: 1:37. Pt picking up pen x2 and requiring increased time with sequencing after 14.  Attempted Trail B, stopping after 2:40 at [facility] after multiple omitted letters and numbers.  Pt demonstrating difficulty with alternating attention between numbers and letters.  Completed additional attempt with alternative printout and pt wearing her reading glasses.  Pt completing in 3:28 with one error, skipping number 2.  OT educating on functional carryover of alternating attention and educating on decreasing extra stimulus and focus on one task at a time due to challenges present during alternating attention task this session.   9 hole peg test: Right: 31.56 sec; Left: 29.25.  Pt with no issues with recall of 2 step directions and no change  in coordination s/p new CVA Vision: OT educated pt on lighthouse strategy with scanning head and eyes to Left during mobility (particularly in unfamiliar and/or cluttered environment), having person walk on L to faciltiate scanning to L, setting up grooming items to L to increase scanning to L.  OT also recommending use of hand placement or other anchor on L side of paper or page when reading to fully scan to Left.  Recommended use of bookmark for increased ease with line follow as pt with difficulty with scanning during Bell cancellation task due to increased stimulation.  Provided  with handout.   PATIENT EDUCATION: Education details: vision and alternating attention Person educated: Patient Education method: Explanation, Verbal cues, and Handouts Education comprehension: verbalized understanding and needs further education  HOME EXERCISE PROGRAM: 11/17/23 - depth perception activities (see pt instructions)   GOALS: Goals reviewed with patient? Yes  SHORT TERM GOALS: Target date: 12/17/23  Pt will be independent with vision HEP with use of handouts. Baseline: new to OPOT Goal status: in progress  2.  Pt will be independent with coordination HEP with use of handouts. Baseline: new to OPOT Goal status:  in progress  3.  Pt will verbalize understanding of compensatory visual scanning strategies to increase attention to L visual field. Baseline: new to OPOT Goal status:  in progress  4.  Pt will improve visual scanning/use compensatory strategies to perform cancellation task with 75% accuracy.  Baseline: 31/35 on Bell cancellation task Goal status:  in progress   LONG TERM GOALS: Target date: 01/14/24  Pt will demonstrate improved fine motor coordination for ADLs as evidenced by decreasing 9 hole peg test score for RUE by 3 secs Baseline: Right: 34.72 sec; Left: 30.94 sec  12/16/23: Right: 31.56 sec 01/19/24: Right: 30.0 sec Goal status: MET  2.  Pt will navigate a moderately busy environment, completing dual task activity and/or following multi-step commands with 90% accuracy. Baseline: difficulty with 2 step commands Goal status: INITIAL  3.  Pt will demonstrate improved coordination to allow for improved speed with handwriting to completing simple sentence in <15 seconds without compromising 100% legibility. Baseline: 17.5 sec 01/19/24: 18.25 sec with 100% legibility Goal status: INITIAL  4.  Pt will verbalize and/or demonstrate understanding of energy conservation strategies to increase engagement in and safety with ADLs and IADLs.  Baseline: new  to OPOT Goal status: INITIAL  SHORT TERM GOALS: Target date: 02/11/24  Pt will be independent with vision HEP with use of handouts. Baseline: new to OPOT Goal status: in progress  2.  Pt will be independent with coordination HEP with use of handouts. Baseline: new to OPOT Goal status:  in progress  4.  Pt will improve visual scanning/use compensatory strategies to perform cancellation task with 92% accuracy.  Baseline: 31/35 on Bell cancellation task Goal status:  REVISED  LONG TERM GOALS: Target date: 03/03/24  1.Pt will demonstrate improved fine motor coordination for ADLs as evidenced by decreasing 9 hole peg test score for RUE by 3 secs from 01/19/24 data. Baseline: Right: 34.72 sec; Left: 30.94 sec  12/16/23: Right: 31.56 sec 01/19/24: Right: 30.0 sec, Left: 29.5 sec Goal status: REVISED  2.  Pt will navigate a moderately busy environment, completing dual task activity and/or following multi-step commands with 90% accuracy. Baseline: difficulty with 2 step commands Goal status: on going  3.  Pt will demonstrate improved coordination to allow for improved speed with handwriting to completing simple sentence in <15 seconds without compromising 100% legibility.  Baseline: 17.5 sec 01/19/24: 18.25 sec with 100% legibility Goal status: on going  4.  Pt will verbalize and/or demonstrate understanding of energy conservation strategies to increase engagement in and safety with ADLs and IADLs.  Baseline: new to OPOT Goal status: on going   ASSESSMENT:  CLINICAL IMPRESSION: Patient is a 83 y.o. female who was seen today for occupational therapy treatment session with focus on vision and coordination s/p CVA. Pt continues to demonstrate visual field impairment, however much more prevalent in middle of paper, both from R <>L and top <> bottom.  Pt reports frequently missing words or information in the middle section of the page, ?attention vs vision. Pt receptive to education on use of  bookmark for line follow to reduce skipping lines.  Pt has had limited participation in therapy sessions due to transportation availability, as pt relies on son who works.  Pt will continue to benefit from skilled occupational therapy services to address coordination, GM/FM control, cognition, safety awareness, introduction of compensatory strategies/AE prn, visual-perception, and implementation of an HEP to improve participation and safety during ADLs and IADLs.    PERFORMANCE DEFICITS: in functional skills including ADLs, IADLs, coordination, pain, Fine motor control, Gross motor control, balance, body mechanics, endurance, decreased knowledge of precautions, decreased knowledge of use of DME, vision, and UE functional use, cognitive skills including memory and safety awareness, and psychosocial skills including coping strategies, environmental adaptation, and routines and behaviors.     PLAN:  OT FREQUENCY: 1-2x/week  OT DURATION: 6 weeks  PLANNED INTERVENTIONS: 97168 OT Re-evaluation, 97535 self care/ADL training, 02889 therapeutic exercise, 97530 therapeutic activity, 97112 neuromuscular re-education, 97140 manual therapy, balance training, functional mobility training, visual/perceptual remediation/compensation, psychosocial skills training, energy conservation, coping strategies training, patient/family education, and DME and/or AE instructions  RECOMMENDED OTHER SERVICES: NA  CONSULTED AND AGREED WITH PLAN OF CARE: Patient  PLAN FOR NEXT SESSION:  further explore depth perception and L attention during functional and table top tasks, 2-3 step directions for recall, educate on energy conservation.    Coordination, vision, attention   KAYLENE DOMINO, OTR/L 01/19/2024, 4:26 PM  [facility] at [facility] [address] [place], [address] [place], [place] [address] Phone # [phone] Fax # [phone]

## 2024-01-19 NOTE — Therapy (Signed)
 OUTPATIENT SPEECH LANGUAGE PATHOLOGY TREATMENT/RECERTIFICATION   Patient Name: Annette Gross MRN: [id number] DOB:03/07/1941, 83 y.o., female Today's Date: 01/19/2024  PCP: Alys Bitters, PA REFERRING PROVIDER: Gayland Lauraine PARAS, NP  END OF SESSION:   End of Session - 01/19/24 1725     Visit Number 4    Number of Visits 6    Date for SLP Re-Evaluation 03/22/24   due to pt not able to schedule ST until 02/24/24   SLP Start Time 1618    SLP Stop Time  1700    SLP Time Calculation (min) 42 min    Activity Tolerance Patient tolerated treatment well           Past Medical History:  Diagnosis Date   Anemia    many yrs ago   Anxiety    Asthma    no recent issues   Cancer (HCC)    s/p thyroidectomy 2018   Complication of anesthesia    Cystitis, interstitial    Degenerative disc disease, lumbar    Degenerative joint disease    spinal stenosis Chronic low back pain   Depression    Diabetes mellitus    diet control.   Diverticulitis 2005   Diverticulosis 2005   Factor V deficiency (HCC)    Fibromyalgia    Fracture of right foot 1995   GERD (gastroesophageal reflux disease)    History of hiatal hernia    Hyperlipidemia    Hypertension    off med x 5 yrs.   IBS (irritable bowel syndrome)    Insomnia    Iron deficiency    Liver hemangioma 1999   MRI   Mononucleosis    Morton's neuroma    Left foot   Nuclear sclerotic cataract of left eye 08/29/2019   Peripheral neuropathy    Pneumonia    years 89 & 89.  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   CHOLECYSTECTOMY     COLONOSCOPY WITH PROPOFOL  N/A 09/02/2015   Procedure: COLONOSCOPY WITH PROPOFOL ;  Surgeon: Gladis MARLA Louder, MD;  Location: [facility];  Service:  Endoscopy;  Laterality: N/A;   GANGLION CYST EXCISION     right   HEMORRHOID SURGERY     HERNIA REPAIR     repair was aimed at the [facility]   HIATAL HERNIA REPAIR     and nissen fundoplication   LAPAROSCOPIC ESOPHAGOGASTRIC FUNDOPLASTY     LAPAROSCOPIC INCISIONAL / UMBILICAL / VENTRAL HERNIA REPAIR     umbilical hernia   LOOP RECORDER INSERTION N/A 09/13/2023   Procedure: LOOP RECORDER INSERTION;  Surgeon: Leverne Charlies Helling, PA-C;  Location: [facility];  Service: Cardiovascular;  Laterality: N/A;   OOPHORECTOMY     left   RIGHT OOPHORECTOMY     '05-laparaoscopic   SHOULDER SURGERY Right    THYROIDECTOMY N/A 07/06/2016   Procedure: TOTAL THYROIDECTOMY;  Surgeon: Krystal Russell, MD;  Location: [facility];  Service: General;  Laterality: N/A;   TONSILLECTOMY     TOTAL THYROIDECTOMY  07/06/2016   TRANSESOPHAGEAL ECHOCARDIOGRAM (CATH LAB) N/A 09/13/2023   Procedure: TRANSESOPHAGEAL ECHOCARDIOGRAM;  Surgeon: Mona Vinie BROCKS, MD;  Location: [facility];  Service: Cardiovascular;  Laterality: N/A;  Patient Active Problem List   Diagnosis Date Noted   Hypertension    CVA (cerebral vascular accident) (HCC) 09/10/2023   Stroke (cerebrum) (HCC) 09/10/2023   Pseudophakia, both eyes 10/13/2021   Dermatochalasis of both lower eyelids 10/13/2021   Moderate nonproliferative diabetic retinopathy of left eye (HCC) 10/10/2020   Moderate nonproliferative diabetic retinopathy of right eye (HCC) 08/29/2019   Early stage nonexudative age-related macular degeneration of both eyes 08/29/2019   Right posterior capsular opacification 08/29/2019   Left thyroid  nodule 07/06/2016   Benign neoplasm of skin of upper limb, including shoulder 04/19/2012   Ventral hernia, recurrent 01/26/2011   Speech Therapy Progress Note  Dates of Reporting Period: 11/17/23 to present (three sessions)  Subjective Statement: Pt was seen for eval and two sessions.   Objective: Pt learned how to complete Recruitment consultant today, and SLP reinforced completion of [employer]. She was encouraged to use description strategy and another strategy she said works well for her - a Lawyer - if she experiences anomia.  Goal Update: See below.    Plan: Continue, with d/c suspected in 2-3 visits.  Reason Skilled Services are Required: Not reached max potential yet.     ONSET DATE: 09/10/23  REFERRING DIAG:  I63.9 (ICD-10-CM) - Acute CVA (cerebrovascular accident) (HCC)    THERAPY DIAG:  Aphasia  Rationale for Evaluation and Treatment: Rehabilitation  SUBJECTIVE:   SUBJECTIVE STATEMENT: It took me a while to come up with 'salsa'.   Pt accompanied by: self  PERTINENT HISTORY:  MRI HEAD WO CONTRAST IMPRESSION: Punctate acute infarct in the right cerebellum.   From [facility] HISTORY OF PRESENT ILLNESS: Today 10/14/23 Here with [name], son's partner. They all live together. Continues to have large vision loss to the left eye, but can see crescent shaped light at the top that is getting larger. Saw ophthalmology.  She feels like right eye is fine. No other symptoms. BP was high today 162/81, then good on recheck 120/70 olmesartan. Continues on home Repatha. Goes to [place] for primary care. Mentions occasionally will have tingling to left side of face every few days, no longer than 30 minutes, only at V2 area radiates behind ear, no headache or dental pain. Mentions prior to stroke had some memory concerns remembering names or hard time finding her words. Would like to get occupational and speech therapy arranged. She doesn't drive since CVA. Other than driving, back to normal activities.   PAIN:  Are you having pain? No  FALLS: Has patient fallen in last 6 months?  See PT evaluation for details   PATIENT GOALS: Concerned about word finding incidents x2-3/week  OBJECTIVE:  Note: Objective measures were completed at Evaluation unless otherwise  noted.  DIAGNOSTIC FINDINGS:  CT 09/10/23 IMPRESSION: No acute CT finding. Age related volume loss. Chronic small-vessel ischemic changes of the white matter and basal ganglia.  MRI 09/10/23 IMPRESSION: 1. Multiple small acute infarcts scattered within the bilateral cerebral hemispheres, 10-15 in number and measuring up to 8 mm. Given involvement of multiple vascular territories, findings suggest sequelae of an embolic process. 2. 8 mm meningioma overlying the anterior right frontal lobe. The mass slightly indents the underlying brain parenchyma without underlying parenchymal edema. 3. Background parenchymal atrophy and chronic small vessel ischemic disease.   SLE - 09/12/23 Clinical Impression  Pt reports concerns with processing PTA but attributes this to age. She states she is typically independent but has assistance PRN from her son and his  family. She scored 23/30 on the SLUMS (a 27 or above is considered WFL) characterized by deficits related to memory (retrieval > stroage), selective attention, and executive functioning. She accurately recalled 3/5 novel words after a delay but states this is likely the most signficant change from her baseline. Pt had difficulty with a clock drawing task, repeating numbers and incorrectly placing the hands. Question effect of L vision deficits. Recommend ongoing SLP f/u on an OP basis, will sign off acutelty.   PATIENT REPORTED OUTCOME MEASURES (PROM): Communication Effectiveness Survey: provided to pt 12/16/23 but not pt forgot to return on 12/22/23. She will bring back 01/17/24.                                                                                                                            TREATMENT DATE:  Semantic Feature Analysis = SFA, Verb Network Strengthening Treatment  01/19/24: Pt again forgot PROM. SLP told pt to try to bring it next time, or PROM goal will be deferred. Pt did not exhibit anomia in today's session. I just kept  (talking about) all the words I could think of associated with salsa. Pt used this strategy when trying to think of salsa which was successful. Today SLP guided pt through another SFA task which pt has not necessarily been completing at home. She did so with rare min A for procedure (write the word in for each answer, say the answer after its written). SLP introduced VNeST with pt today as well, and she completed with usual max A. SLP told pt to complete 2-3 of these per week. Pt and SLP agreed pt should be seen once every other week x2-3 more visits due to progress. Pt states it is a little harder to find the word I want when anomia occurs. SLP to focus on ensuring she knows how to perform SFA and VNeST however this is dependent upon pt's use of these at home. Secondly, pt to cont focusing on her use of compensations for anomia.  12/22/23: Pt did not return PROM - told her to bring next session. Best compensation at this time is I just wait a bit and the word comes.  Wendolyn reports anomia frequency continuing as 2-3 times a week. SLP reviewed previous session with pt and told her she should not perform SFA with proper nouns but SLP DID encourage pt to write/spell word x5, then put the word into three different sentences if she cannot use a SFA with it. Today SLP and pt completed another SFA with SLP  (mod I necessary) reiterating to only use SFA with nouns and not proper nouns. Pt to see SLP in 4 weeks and likely either d/c or reduce frequency to once every two weeks.  12/16/23: PROM provided to return next session. Pt looks relatively unchanged with speech and language after rt cerebellar CVA on 11/26/23. Will recert today. SLP introduced SFA to pt today and collaborated with her to complete one  with platter as the target word. Pt req'd initial cues for writing the target word in the sentences but was independent after that. In conversation with SLP during the session today neither anomia nor a verbal  expression deficit were noted. Pt will continue with this process (SFA) if she experiences anomia with a noun. Homework provided.  11/17/23: n/a  PATIENT EDUCATION: Education details: See treatment date for more details Person educated: Patient Education method: Explanation Education comprehension: verbalized understanding   GOALS: Goals reviewed with patient? Yes  SHORT TERM GOALS:  = LONG TERM GOALS: Target date:   03/22/24 (due to pt unable to schedule ST until 02/24/24)  Pt will perform SFA with mod I in 2 sessions Baseline: 12/22/23 Goal status: INITIAL  2.  Pt will perform VNEST with mod I in 2 sessions Baseline: 01/19/24 Goal status: INITIAL  3.  Pt will report using one new compensatory strategy for anomia between two sessions Baseline: 12/22/23 Goal status: met  4.  Pt will score better with PROM than initial administration Baseline:  Goal status: INITIAL   ASSESSMENT:  CLINICAL IMPRESSION: Patient is a 83 y.o. F who was seen today for treatment of aphasia following CVA Sep 10, 2023. She has premorbid dx of ADD (since 1999-2000 pt stated). Pt mentions prior to CVA had some s/sx aphasia and memory disorder. She continues to experience anomia and it was more challenging to find her target word in the last few weeks. This could be mostly due to the depth of conversation pt was in, not completing SFA tasks at home, or fatigue. Pt would benefit from a short course of ST cont'ing to targeting a wider use of compensations, and teaching pt home activities to strengthen word finding and verbal expression (SFA and VNEST).    OBJECTIVE IMPAIRMENTS: include expressive language. These impairments are limiting patient from effectively communicating at home and in community. Factors affecting potential to achieve goals and functional outcome are premorbid dx of ADD.SABRA Patient will benefit from skilled SLP services to address above impairments and improve overall function.  REHAB  POTENTIAL: Excellent  PLAN:  SLP FREQUENCY: 1x/week  SLP DURATION: 4 weeks total  PLANNED INTERVENTIONS: Language facilitation, Internal/external aids, Multimodal communication approach, SLP instruction and feedback, Compensatory strategies, Patient/family education, and [id number] Treatment of speech (30 or 45 min)     Lovey Crupi, CCC-SLP 01/19/2024, 5:25 PM

## 2024-01-19 NOTE — Therapy (Signed)
 OUTPATIENT PHYSICAL THERAPY TREATMENT/RECERT/PROGRESS NOTE   Patient Name: Annette Gross MRN: [id number] DOB:04-19-1941, 83 y.o., female Today's Date: 01/19/2024   PCP: Alys Schuyler HER[place], [name] REFERRING PROVIDER: Gayland Lauraine PARAS, NP   Progress Note Reporting Period 11/17/2023 to 01/19/2024  See note below for Objective Data and Assessment of Progress/Goals.     END OF SESSION:  PT End of Session - 01/19/24 1452     Visit Number 7    Number of Visits 21    Date for PT Re-Evaluation 03/10/24    Authorization Type [employer]-reauth submitted 01/19/2024    Authorization Time Period Auth#: [id number] approved 12 PT visits from 11/17/2023-12/29/2023    Progress Note Due on Visit 17    PT Start Time 1452    PT Stop Time 1530    PT Time Calculation (min) 38 min    Equipment Utilized During Treatment Gait belt    Activity Tolerance Patient tolerated treatment well    Behavior During Therapy WFL for tasks assessed/performed              Past Medical History:  Diagnosis Date   Anemia    many yrs ago   Anxiety    Asthma    no recent issues   Cancer (HCC)    s/p thyroidectomy 2018   Complication of anesthesia    Cystitis, interstitial    Degenerative disc disease, lumbar    Degenerative joint disease    spinal stenosis Chronic low back pain   Depression    Diabetes mellitus    diet control.   Diverticulitis 2005   Diverticulosis 2005   Factor V deficiency (HCC)    Fibromyalgia    Fracture of right foot 1995   GERD (gastroesophageal reflux disease)    History of hiatal hernia    Hyperlipidemia    Hypertension    off med x 5 yrs.   IBS (irritable bowel syndrome)    Insomnia    Iron deficiency    Liver hemangioma 1999   MRI   Mononucleosis    Morton's neuroma    Left foot   Nuclear sclerotic cataract of left eye 08/29/2019   Peripheral neuropathy    Pneumonia    years 89 & 89.  None since   PONV (postoperative nausea and vomiting)    nausea  no vomiting   Rectocele    Transfusion history    child x2, many yrs ago after childbirthhemorrhage   Umbilical hernia 2012   Past Surgical History:  Procedure Laterality Date   blood vessel tumor removal  1980   from chin   CHOLECYSTECTOMY     COLONOSCOPY WITH PROPOFOL  N/A 09/02/2015   Procedure: COLONOSCOPY WITH PROPOFOL ;  Surgeon: Gladis MARLA Louder, MD;  Location: [facility];  Service: Endoscopy;  Laterality: N/A;   GANGLION CYST EXCISION     right   HEMORRHOID SURGERY     HERNIA REPAIR     repair was aimed at the [facility]   HIATAL HERNIA REPAIR     and nissen fundoplication   LAPAROSCOPIC ESOPHAGOGASTRIC FUNDOPLASTY     LAPAROSCOPIC INCISIONAL / UMBILICAL / VENTRAL HERNIA REPAIR     umbilical hernia   LOOP RECORDER INSERTION N/A 09/13/2023   Procedure: LOOP RECORDER INSERTION;  Surgeon: Leverne Charlies Helling, PA-C;  Location: [facility];  Service: Cardiovascular;  Laterality: N/A;   OOPHORECTOMY     left   RIGHT OOPHORECTOMY     '05-laparaoscopic   SHOULDER SURGERY Right  THYROIDECTOMY N/A 07/06/2016   Procedure: TOTAL THYROIDECTOMY;  Surgeon: Krystal Russell, MD;  Location: [facility] OR;  Service: General;  Laterality: N/A;   TONSILLECTOMY     TOTAL THYROIDECTOMY  07/06/2016   TRANSESOPHAGEAL ECHOCARDIOGRAM (CATH LAB) N/A 09/13/2023   Procedure: TRANSESOPHAGEAL ECHOCARDIOGRAM;  Surgeon: Mona Vinie BROCKS, MD;  Location: [facility];  Service: Cardiovascular;  Laterality: N/A;   Patient Active Problem List   Diagnosis Date Noted   Hypertension    CVA (cerebral vascular accident) (HCC) 09/10/2023   Stroke (cerebrum) (HCC) 09/10/2023   Pseudophakia, both eyes 10/13/2021   Dermatochalasis of both lower eyelids 10/13/2021   Moderate nonproliferative diabetic retinopathy of left eye (HCC) 10/10/2020   Moderate nonproliferative diabetic retinopathy of right eye (HCC) 08/29/2019   Early stage nonexudative age-related macular degeneration of both eyes 08/29/2019   Right  posterior capsular opacification 08/29/2019   Left thyroid  nodule 07/06/2016   Benign neoplasm of skin of upper limb, including shoulder 04/19/2012   Ventral hernia, recurrent 01/26/2011    ONSET DATE: 09/10/23  REFERRING DIAG:  I63.9 (ICD-10-CM) - Acute CVA (cerebrovascular accident) (HCC)    THERAPY DIAG:  Unsteadiness on feet  Muscle weakness (generalized)  Other abnormalities of gait and mobility  Rationale for Evaluation and Treatment: Rehabilitation  SUBJECTIVE:                                                                                                                                                                                             SUBJECTIVE STATEMENT: The main problem is my balance.  Want to make sure to keep going with PT to keep working on my balance.  Dizziness comes and goes.  This time it is lasting about 3 days-not like the room is spinning, just more like sway and unsteadiness.   Pt accompanied by: self  PERTINENT HISTORY: 83 y.o. year old female with stroke in May 2025.  Left eye central retinal artery occlusion and multiple small scattered ischemic infarcts.  Cardioembolic source.  Presented to her ophthalmologist with painless loss of vision to the left eye.  Loop recorder placed 09/13/23.  TEE negative.  Vascular risk factors: HTN, HLD. Incidental 8 mm meningioma overlying the anterior right frontal lobe  PAIN:  Are you having pain? Yes: NPRS scale: no/10 Pain location: posterior right knee Pain description: ache Aggravating factors: standing/walking Relieving factors: cortisone shot recently  PRECAUTIONS: None  RED FLAGS: None   WEIGHT BEARING RESTRICTIONS: No  FALLS: Has patient fallen in last 6 months? Yes. Number of falls 2 falls involving going down stairs; 1 recent fall on to grass 11/17/23  LIVING ENVIRONMENT:  Lives with: lives with their son Lives in: House/apartment ground floor set-up Stairs: 6 steps to enter Has following  equipment at home: Single point cane and Environmental consultant - 4 wheeled  PLOF: Independent with household mobility with device and Independent with household mobility without device  PATIENT GOALS: improve balance  OBJECTIVE:    TODAY'S TREATMENT: 01/19/2024 Activity Comments  10 M walk:  20.57 sec 1.59 ft/sec  5x sit to stand:  18.91 sec   Berg 27/56 See below  Review of HEP- See below; pt needs cues for correct technique for smooth pursuit, saccades and VOR; she reports no dizziness  Upgraded HEP to add red band for LAQ and green band for seated hip abduction   Standing march x 10 Romberg EO/EC head turns/nods  UE support at locked walker     [facility] PT Assessment - 01/19/24 1507       Standardized Balance Assessment   Standardized Balance Assessment Berg Balance Test      Berg Balance Test   Sit to Stand Able to stand  independently using hands    Standing Unsupported Able to stand safely 2 minutes    Sitting with Back Unsupported but Feet Supported on Floor or Stool Able to sit safely and securely 2 minutes    Stand to Sit Sits safely with minimal use of hands    Transfers Able to transfer safely, definite need of hands    Standing Unsupported with Eyes Closed Able to stand 3 seconds    Standing Unsupported with Feet Together Able to place feet together independently but unable to hold for 30 seconds    From Standing, Reach Forward with Outstretched Arm Can reach forward >5 cm safely (2)    From Standing Position, Pick up Object from Floor Unable to try/needs assist to keep balance    From Standing Position, Turn to Look Behind Over each Shoulder Needs supervision when turning    Turn 360 Degrees Needs assistance while turning    Standing Unsupported, Alternately Place Feet on Step/Stool Needs assistance to keep from falling or unable to try    Standing Unsupported, One Foot in Front Able to take small step independently and hold 30 seconds    Standing on One Leg Unable to try or needs  assist to prevent fall    Total Score 27            HOME EXERCISE PROGRAM Access Code: [email or web address] URL: [email or web address] Date: 12/20/2023> updates 01/19/2024 Prepared by: [facility] - Outpatient  Rehab - [facility]  Program Notes perform standing exercises at corner or counter for safety  Exercises - Seated Long Arc Quad  - 1 x daily - 7 x weekly - 3 sets - 10 reps (*try red band at home) - Staggered Sit-to-Stand  - 1 x daily - 7 x weekly - 3 sets - 5 reps - Heel Toe Raises with Counter Support  - 1 x daily - 7 x weekly - 3 sets - 10 reps - Standing Hip Abduction with Counter Support  - 1 x daily - 7 x weekly - 3 sets - 10 reps - Staggered Stance Forward Backward Weight Shift with Counter Support  - 1 x daily - 7 x weekly - 3 sets - 10 reps - Seated Horizontal Smooth Pursuit  - 1 x daily - 7 x weekly - 2 sets - 30 sec hold - Seated Vertical Smooth Pursuit  - 1 x daily - 7 x weekly - 2 sets -  30 sec hold - Seated VOR Cancellation  - 1 x daily - 7 x weekly - 2 sets - 30 sec hold - Seated Horizontal Saccades  - 1 x daily - 7 x weekly - 2 sets - 30 sec hold - Standing Balance in Corner with Eyes Closed  - 1 x daily - 5 x weekly - 2 sets - 30 sec hold - Seated Hip Abduction with Resistance  - 2-3 x weekly - 3 sets - 10 reps (*green band) - Marching with Resistance  - 2-3 x weekly - 3 sets - 10 reps (performing without resistance)     PATIENT EDUCATION: Education details: Continue current HEP and upgrades noted bolded above; continue use of rollator for safety, per fall risk.  Discussed POC and progress to goals, plans for recert today Person educated: Patient Education method: Explanation, Demonstration, Tactile cues, Verbal cues, and Handouts Education comprehension: verbalized understanding and returned demonstration   --------------------------------------------  VESTIBULAR ASSESSMENT 12/01/23:  GENERAL OBSERVATION: n/a   SYMPTOM BEHAVIOR:  Subjective  history: Increased dizziness after acute new CVA on 11/26/23  Non-Vestibular symptoms: nausea/vomiting  Type of dizziness: Imbalance (Disequilibrium), Unsteady with head/body turns, and It feels like I'm moving.  Frequency: Comes and goes, maybe 3-5x in the day  Duration: Can last a couple of hours  Aggravating factors: Induced by motion: turning body quickly and turning head quickly  Relieving factors: medication and rest  Progression of symptoms: worse  OCULOMOTOR EXAM:  Ocular Alignment: normal  Ocular ROM: No Limitations  Spontaneous Nystagmus: absent  Gaze-Induced Nystagmus: absent  Smooth Pursuits: intact  Saccades: slow  FRENZEL - FIXATION SUPRESSED: Did not assess  VESTIBULAR - OCULAR REFLEX:   Slow VOR: Positive Left  VOR Cancellation: Normal  Head-Impulse Test: HIT Right: negative HIT Left: positive  Dynamic Visual Acuity: TBA   POSITIONAL TESTING: did not assess   MOTION SENSITIVITY: did not assess   OTHOSTATICS: not done  DIAGNOSTIC FINDINGS:   MR BRAIN WO CONTRAST Result Date: 11/26/2023 CLINICAL DATA:  Neuro deficit, acute, stroke suspected EXAM: MRI HEAD WITHOUT CONTRAST TECHNIQUE: Multiplanar, multiecho pulse sequences of the brain and surrounding structures were obtained without intravenous contrast. COMPARISON:  MRI head Sep 10, 2023 FINDINGS: Brain: Punctate acute infarct in the right cerebellum (series five, image sixty one). No acute hemorrhage, hydrocephalus, extra-axial collection or mass lesion. Multiple small remote lacunar infarcts in the frontal and parietal white matter bilaterally. Patchy T2/FLAIR hyperintensities, compatible with chronic microvascular ischemic change. Vascular: Normal flow voids. Skull and upper cervical spine: Normal marrow signal. Sinuses/Orbits: Negative. IMPRESSION: Punctate acute infarct in the right cerebellum. Electronically Signed   By: Gilmore GORMAN Molt M.D.   On: 11/26/2023 03:24  09/10/23 MRI multiple small acute infarcts  scattered within bilateral cerebral hemispheres, likely embolic in nature, 8 mm meningioma overlying anterior right frontal lobe, background atrophy and chronic small vessel ischemic disease - MRA no LVO or significant stenosis - Carotid Doppler unremarkable - Bilateral lower extremity ultrasound negative - 2D echo EF 60 to 65% - Loop recorder placed - TEE EF 60 to 65%, negative for PFO - LDL 30 - A1c 6.6  FROM INITIAL EVALUATION 11/17/23 (UNLESS OTHERWISE NOTED)  COGNITION: Overall cognitive status: Within functional limits for tasks assessed   SENSATION: WFL  COORDINATION: WFL Slight dysmetria finger to nose LUE  EDEMA:  None noted  MUSCLE TONE: NT  MUSCLE LENGTH: WFL  DTRs:  NT  POSTURE: scoliosis w/ pelvic obliquity   LOWER EXTREMITY ROM:  WFL   LOWER EXTREMITY MMT:    MMT Right Eval Left Eval R/L 12/01/23  Hip flexion 5 4 3+/3+  Hip extension   3+/3+  Hip abduction 4 3- 3/3-  Hip adduction 4 4   Hip internal rotation     Hip external rotation     Knee flexion 5 4 5/4  Knee extension 5 4 5/4  Ankle dorsiflexion 5 5   Ankle plantarflexion     Ankle inversion     Ankle eversion     (Blank rows = not tested)  BED MOBILITY:  Not tested reports indep  TRANSFERS: Sit to stand: Complete Independence  Assistive device utilized: None     Chair to chair: Modified independence  Assistive device utilized: UE support       RAMP:  Not tested  CURB:  Findings: SBA-CGA  STAIRS: Findings: Comments: Supervision w/ BHR GAIT: Findings: Gait Characteristics: trendelenburg and Comments: left Modified indep level surfaces, CGA uneven  FUNCTIONAL TESTS:   11/17/23 5 times sit to stand: 18.78 sec Timed up and go (TUG): 19 sec w/ cane 10 meter walk test: 20 sec w/ cane = 1.64 ft/sec Berg Balance Test:42/56  M-CTSIB  Condition 1: Firm Surface, EO 30 Sec, Moderate Sway  Condition 2: Firm Surface, EC 6 Sec, Severe Sway  Condition 3: Foam Surface, EO  Sec,   Sway  Condition 4: Foam Surface, EC  Sec,  Sway    12/01/23 5 times sit to stand: 27.47 sec (R hand on knee, uses legs against bed) 10 meter walk test: 17.03 sec with  Lars Balance Test: 27/56 TUG: Deferred today due to pt L hip fatigued and starting to hurt after functional testing MCTSIB: Deferred today due to pt L hip pain    [facility] PT Assessment - 01/19/24 1507       Standardized Balance Assessment   Standardized Balance Assessment Berg Balance Test      Berg Balance Test   Sit to Stand Able to stand  independently using hands    Standing Unsupported Able to stand safely 2 minutes    Sitting with Back Unsupported but Feet Supported on Floor or Stool Able to sit safely and securely 2 minutes    Stand to Sit Sits safely with minimal use of hands    Transfers Able to transfer safely, definite need of hands    Standing Unsupported with Eyes Closed Able to stand 3 seconds    Standing Unsupported with Feet Together Able to place feet together independently but unable to hold for 30 seconds    From Standing, Reach Forward with Outstretched Arm Can reach forward >5 cm safely (2)    From Standing Position, Pick up Object from Floor Unable to try/needs assist to keep balance    From Standing Position, Turn to Look Behind Over each Shoulder Needs supervision when turning    Turn 360 Degrees Needs assistance while turning    Standing Unsupported, Alternately Place Feet on Step/Stool Needs assistance to keep from falling or unable to try    Standing Unsupported, One Foot in Front Able to take small step independently and hold 30 seconds    Standing on One Leg Unable to try or needs assist to prevent fall    Total Score 27  GOALS: Goals reviewed with patient? Yes  SHORT TERM GOALS: Target date: 12/08/2023>>UPDATED STG 02/11/2024    Patient will be  independent in HEP to improve functional outcomes Baseline: needs cues 01/19/2024 Goal status: IN PROGRESS, 01/19/2024  2.  Demo improved postural stability per mild-moderate sway x 30 sec condition 2 M-CTSIB to improve safety with ADL Baseline: 10 sec with mod-severe sway Goal status: IN PROGRESS, 01/19/2024  3.  Demo improved BLE strength and reduce risk for falls per time 15 sec 5xSTS test Baseline: 18 sec; 13 sec Goal status: MET    LONG TERM GOALS: Target date: 01/12/2024>>UPDATED TARGET 03/10/2024    Demo improved left hip abduction strength to 3+/5 for improved single limb support and to minimize gait dysfunction Baseline: 3-/5, Trendelenberg Goal status: IN PROGRESS, 01/19/2024  2.  Modified independent stair ambulation for improved safety with entering/exiting home Baseline: supervision BHR Goal status: IN PROGRESS, 01/19/2024  3.  Reduce risk for falls per score 48/56 Berg Balance Test Baseline 11/17/23: 42/56 12/01/23: 21/56; 01/19/2024 27/56 Goal status: IN PROGRESS, 01/19/2024  4.  Reduce risk for falls per time 14 sec TUG test Baseline: 19 sec w/ cane; 21 sec with 4WW 12/20/23 Goal status: IN PROGRESS 12/20/23  5.  Modified independent with uneven surfaces and curb negotiation to improve safety in community Baseline: CGA Goal status: IN PROGRESS 01/19/2024  6.  Pt will improve 5x sit<>stand to less than or equal to 13 sec to demonstrate improved functional strength and transfer efficiency.  Baseline:  18 sec 01/19/2024  Goals status:  INITIAL 01/19/2024      ASSESSMENT:  CLINICAL IMPRESSION: Pt presents today, not having been seen since 12/20/2023 (this may be due to our scheduling, but also scheduling/transportation constraints on patient).  She is out of her initial POC, so goals assessed today and recert completed.  She reports balance continues to be a main concern for her; she reports walking at home without walker sometimes.  Skilled PT session focused on assessing  objective measures and reviewing HEP.  Berg score of 27/56 and 5x sit to stand score of 18 seconds and gait velocity score of 1.59 ft/sec indicate increased fall risk.  She has not met goals fully, as she has not had therapy sessions in one month.  She would benefit from skilled PT towards goals to improve functional mobility, independence, and decreased fall risk.    OBJECTIVE IMPAIRMENTS: Abnormal gait, decreased activity tolerance, decreased balance, difficulty walking, decreased strength, and postural dysfunction.   ACTIVITY LIMITATIONS: carrying, lifting, standing, stairs, transfers, reach over head, and locomotion level  PARTICIPATION LIMITATIONS: meal prep, cleaning, laundry, interpersonal relationship, driving, shopping, and community activity  PERSONAL FACTORS: Age, Time since onset of injury/illness/exacerbation, and 1-2 comorbidities: PMH are also affecting patient's functional outcome.   REHAB POTENTIAL: Good  CLINICAL DECISION MAKING: Evolving/moderate complexity  EVALUATION COMPLEXITY: Moderate  PLAN:  PT FREQUENCY: 1-2x/week  PT DURATION: 8 weeks per recert 01/19/2024  PLANNED INTERVENTIONS: 97164- PT Re-evaluation, 97750- Physical Performance Testing, 97110-Therapeutic exercises, 97530- Therapeutic activity, 97112- Neuromuscular re-education, 97535- Self Care, [id number]- Manual therapy, [phone]- Gait training, [phone]- Orthotic Initial, [phone]- Canalith repositioning, [id number]- Aquatic Therapy, [phone]- Electrical stimulation (unattended), [phone] (1-2 muscles), [id number] (3+ muscles)- Dry Needling, Patient/Family education, Balance training, Stair training, Taping, Joint mobilization, Spinal mobilization, Vestibular training, Cryotherapy, and Moist heat  PLAN FOR NEXT SESSION: Review oculomotor HEP, vestibular training, strengthening, balance; progress HEP as appropriate   Greig Anon, PT 01/19/24 4:04 PM Phone: [phone] Fax: [phone]  [place]  [facility] [address] [place], [place] [address] Phone # [phone] Fax # [phone]

## 2024-02-01 DIAGNOSIS — E782 Mixed hyperlipidemia: Secondary | ICD-10-CM | POA: Diagnosis not present

## 2024-02-01 DIAGNOSIS — E890 Postprocedural hypothyroidism: Secondary | ICD-10-CM | POA: Diagnosis not present

## 2024-02-01 DIAGNOSIS — M810 Age-related osteoporosis without current pathological fracture: Secondary | ICD-10-CM | POA: Diagnosis not present

## 2024-02-01 DIAGNOSIS — J4530 Mild persistent asthma, uncomplicated: Secondary | ICD-10-CM | POA: Diagnosis not present

## 2024-02-02 NOTE — Therapy (Incomplete)
 OUTPATIENT PHYSICAL THERAPY TREATMENT NOTE   Patient Name: Annette Gross MRN: [id number] DOB:1941/04/03, 83 y.o., female Today's Date: 02/02/2024   PCP: Alys Schuyler HERO, PA REFERRING PROVIDER: Gayland Lauraine PARAS, NP     END OF SESSION:        Past Medical History:  Diagnosis Date   Anemia    many yrs ago   Anxiety    Asthma    no recent issues   Cancer [facility])    s/p thyroidectomy 2018   Complication of anesthesia    Cystitis, interstitial    Degenerative disc disease, lumbar    Degenerative joint disease    spinal stenosis Chronic low back pain   Depression    Diabetes mellitus    diet control.   Diverticulitis 2005   Diverticulosis 2005   Factor V deficiency (HCC)    Fibromyalgia    Fracture of right foot 1995   GERD (gastroesophageal reflux disease)    History of hiatal hernia    Hyperlipidemia    Hypertension    off med x 5 yrs.   IBS (irritable bowel syndrome)    Insomnia    Iron deficiency    Liver hemangioma 1999   MRI   Mononucleosis    Morton's neuroma    Left foot   Nuclear sclerotic cataract of left eye 08/29/2019   Peripheral neuropathy    Pneumonia    years 89 & 89.  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   CHOLECYSTECTOMY     COLONOSCOPY WITH PROPOFOL  N/A 09/02/2015   Procedure: COLONOSCOPY WITH PROPOFOL ;  Surgeon: Gladis MARLA Louder, MD;  Location: [facility];  Service: Endoscopy;  Laterality: N/A;   GANGLION CYST EXCISION     right   HEMORRHOID SURGERY     HERNIA REPAIR     repair was aimed at the [facility]   HIATAL HERNIA REPAIR     and nissen fundoplication   LAPAROSCOPIC ESOPHAGOGASTRIC FUNDOPLASTY     LAPAROSCOPIC INCISIONAL / UMBILICAL / VENTRAL HERNIA REPAIR     umbilical hernia    LOOP RECORDER INSERTION N/A 09/13/2023   Procedure: LOOP RECORDER INSERTION;  Surgeon: Leverne Charlies Helling, PA-C;  Location: [facility];  Service: Cardiovascular;  Laterality: N/A;   OOPHORECTOMY     left   RIGHT OOPHORECTOMY     '05-laparaoscopic   SHOULDER SURGERY Right    THYROIDECTOMY N/A 07/06/2016   Procedure: TOTAL THYROIDECTOMY;  Surgeon: Krystal Russell, MD;  Location: [facility];  Service: General;  Laterality: N/A;   TONSILLECTOMY     TOTAL THYROIDECTOMY  07/06/2016   TRANSESOPHAGEAL ECHOCARDIOGRAM (CATH LAB) N/A 09/13/2023   Procedure: TRANSESOPHAGEAL ECHOCARDIOGRAM;  Surgeon: Mona Vinie BROCKS, MD;  Location: [facility];  Service: Cardiovascular;  Laterality: N/A;   Patient Active Problem List   Diagnosis Date Noted   Hypertension    CVA (cerebral vascular accident) (HCC) 09/10/2023   Stroke (cerebrum) (HCC) 09/10/2023   Pseudophakia, both eyes 10/13/2021   Dermatochalasis of both lower eyelids 10/13/2021   Moderate nonproliferative diabetic retinopathy of left eye (HCC) 10/10/2020   Moderate nonproliferative diabetic retinopathy of right eye (HCC) 08/29/2019   Early stage nonexudative age-related macular  degeneration of both eyes 08/29/2019   Right posterior capsular opacification 08/29/2019   Left thyroid  nodule 07/06/2016   Benign neoplasm of skin of upper limb, including shoulder 04/19/2012   Ventral hernia, recurrent 01/26/2011    ONSET DATE: 09/10/23  REFERRING DIAG:  I63.9 (ICD-10-CM) - Acute CVA (cerebrovascular accident) (HCC)    THERAPY DIAG:  No diagnosis found.  Rationale for Evaluation and Treatment: Rehabilitation  SUBJECTIVE:                                                                                                                                                                                             SUBJECTIVE STATEMENT: The main problem is my balance.  Want to make sure to keep going with PT to keep working on my balance.   Dizziness comes and goes.  This time it is lasting about 3 days-not like the room is spinning, just more like sway and unsteadiness.   Pt accompanied by: self  PERTINENT HISTORY: 83 y.o. year old female with stroke in May 2025.  Left eye central retinal artery occlusion and multiple small scattered ischemic infarcts.  Cardioembolic source.  Presented to her ophthalmologist with painless loss of vision to the left eye.  Loop recorder placed 09/13/23.  TEE negative.  Vascular risk factors: HTN, HLD. Incidental 8 mm meningioma overlying the anterior right frontal lobe  PAIN:  Are you having pain? Yes: NPRS scale: no/10 Pain location: posterior right knee Pain description: ache Aggravating factors: standing/walking Relieving factors: cortisone shot recently  PRECAUTIONS: None  RED FLAGS: None   WEIGHT BEARING RESTRICTIONS: No  FALLS: Has patient fallen in last 6 months? Yes. Number of falls 2 falls involving going down stairs; 1 recent fall on to grass 11/17/23  LIVING ENVIRONMENT: Lives with: lives with their son Lives in: House/apartment ground floor set-up Stairs: 6 steps to enter Has following equipment at home: Single point cane and Environmental consultant - 4 wheeled  PLOF: Independent with household mobility with device and Independent with household mobility without device  PATIENT GOALS: improve balance  OBJECTIVE:       TODAY'S TREATMENT: 02/03/24 Activity Comments                        TODAY'S TREATMENT: 01/19/2024 Activity Comments  10 M walk:  20.57 sec 1.59 ft/sec  5x sit to stand:  18.91 sec   Berg 27/56 See below  Review of HEP- See below; pt needs cues for correct technique for smooth pursuit, saccades and VOR; she reports no dizziness  Upgraded HEP to add red band for LAQ and green band  for seated hip abduction   Standing march x 10 Romberg EO/EC head turns/nods  UE support at locked walker         HOME EXERCISE PROGRAM Access Code: [email or web address] URL:  [email or web address] Date: 12/20/2023> updates 01/19/2024 Prepared by: [facility] - Outpatient  Rehab - [facility]  Program Notes perform standing exercises at corner or counter for safety  Exercises - Seated Long Arc Quad  - 1 x daily - 7 x weekly - 3 sets - 10 reps (*try red band at home) - Staggered Sit-to-Stand  - 1 x daily - 7 x weekly - 3 sets - 5 reps - Heel Toe Raises with Counter Support  - 1 x daily - 7 x weekly - 3 sets - 10 reps - Standing Hip Abduction with Counter Support  - 1 x daily - 7 x weekly - 3 sets - 10 reps - Staggered Stance Forward Backward Weight Shift with Counter Support  - 1 x daily - 7 x weekly - 3 sets - 10 reps - Seated Horizontal Smooth Pursuit  - 1 x daily - 7 x weekly - 2 sets - 30 sec hold - Seated Vertical Smooth Pursuit  - 1 x daily - 7 x weekly - 2 sets - 30 sec hold - Seated VOR Cancellation  - 1 x daily - 7 x weekly - 2 sets - 30 sec hold - Seated Horizontal Saccades  - 1 x daily - 7 x weekly - 2 sets - 30 sec hold - Standing Balance in Corner with Eyes Closed  - 1 x daily - 5 x weekly - 2 sets - 30 sec hold - Seated Hip Abduction with Resistance  - 2-3 x weekly - 3 sets - 10 reps (*green band) - Marching with Resistance  - 2-3 x weekly - 3 sets - 10 reps (performing without resistance)     PATIENT EDUCATION: Education details: Continue current HEP and upgrades noted bolded above; continue use of rollator for safety, per fall risk.  Discussed POC and progress to goals, plans for recert today Person educated: Patient Education method: Explanation, Demonstration, Tactile cues, Verbal cues, and Handouts Education comprehension: verbalized understanding and returned demonstration   --------------------------------------------  VESTIBULAR ASSESSMENT 12/01/23:  GENERAL OBSERVATION: n/a   SYMPTOM BEHAVIOR:  Subjective history: Increased dizziness after acute new CVA on 11/26/23  Non-Vestibular symptoms: nausea/vomiting  Type  of dizziness: Imbalance (Disequilibrium), Unsteady with head/body turns, and It feels like I'm moving.  Frequency: Comes and goes, maybe 3-5x in the day  Duration: Can last a couple of hours  Aggravating factors: Induced by motion: turning body quickly and turning head quickly  Relieving factors: medication and rest  Progression of symptoms: worse  OCULOMOTOR EXAM:  Ocular Alignment: normal  Ocular ROM: No Limitations  Spontaneous Nystagmus: absent  Gaze-Induced Nystagmus: absent  Smooth Pursuits: intact  Saccades: slow  FRENZEL - FIXATION SUPRESSED: Did not assess  VESTIBULAR - OCULAR REFLEX:   Slow VOR: Positive Left  VOR Cancellation: Normal  Head-Impulse Test: HIT Right: negative HIT Left: positive  Dynamic Visual Acuity: TBA   POSITIONAL TESTING: did not assess   MOTION SENSITIVITY: did not assess   OTHOSTATICS: not done  DIAGNOSTIC FINDINGS:   MR BRAIN WO CONTRAST Result Date: 11/26/2023 CLINICAL DATA:  Neuro deficit, acute, stroke suspected EXAM: MRI HEAD WITHOUT CONTRAST TECHNIQUE: Multiplanar, multiecho pulse sequences of the brain and surrounding structures were obtained without intravenous contrast. COMPARISON:  MRI head Sep 10, 2023 FINDINGS: Brain: Punctate  acute infarct in the right cerebellum (series five, image sixty one). No acute hemorrhage, hydrocephalus, extra-axial collection or mass lesion. Multiple small remote lacunar infarcts in the frontal and parietal white matter bilaterally. Patchy T2/FLAIR hyperintensities, compatible with chronic microvascular ischemic change. Vascular: Normal flow voids. Skull and upper cervical spine: Normal marrow signal. Sinuses/Orbits: Negative. IMPRESSION: Punctate acute infarct in the right cerebellum. Electronically Signed   By: Gilmore GORMAN Molt M.D.   On: 11/26/2023 03:24  09/10/23 MRI multiple small acute infarcts scattered within bilateral cerebral hemispheres, likely embolic in nature, 8 mm meningioma overlying anterior  right frontal lobe, background atrophy and chronic small vessel ischemic disease - MRA no LVO or significant stenosis - Carotid Doppler unremarkable - Bilateral lower extremity ultrasound negative - 2D echo EF 60 to 65% - Loop recorder placed - TEE EF 60 to 65%, negative for PFO - LDL 30 - A1c 6.6  FROM INITIAL EVALUATION 11/17/23 (UNLESS OTHERWISE NOTED)  COGNITION: Overall cognitive status: Within functional limits for tasks assessed   SENSATION: WFL  COORDINATION: WFL Slight dysmetria finger to nose LUE  EDEMA:  None noted  MUSCLE TONE: NT  MUSCLE LENGTH: WFL  DTRs:  NT  POSTURE: scoliosis w/ pelvic obliquity   LOWER EXTREMITY ROM:    WFL   LOWER EXTREMITY MMT:    MMT Right Eval Left Eval R/L 12/01/23  Hip flexion 5 4 3+/3+  Hip extension   3+/3+  Hip abduction 4 3- 3/3-  Hip adduction 4 4   Hip internal rotation     Hip external rotation     Knee flexion 5 4 5/4  Knee extension 5 4 5/4  Ankle dorsiflexion 5 5   Ankle plantarflexion     Ankle inversion     Ankle eversion     (Blank rows = not tested)  BED MOBILITY:  Not tested reports indep  TRANSFERS: Sit to stand: Complete Independence  Assistive device utilized: None     Chair to chair: Modified independence  Assistive device utilized: UE support       RAMP:  Not tested  CURB:  Findings: SBA-CGA  STAIRS: Findings: Comments: Supervision w/ BHR GAIT: Findings: Gait Characteristics: trendelenburg and Comments: left Modified indep level surfaces, CGA uneven  FUNCTIONAL TESTS:   11/17/23 5 times sit to stand: 18.78 sec Timed up and go (TUG): 19 sec w/ cane 10 meter walk test: 20 sec w/ cane = 1.64 ft/sec Berg Balance Test:42/56  M-CTSIB  Condition 1: Firm Surface, EO 30 Sec, Moderate Sway  Condition 2: Firm Surface, EC 6 Sec, Severe Sway  Condition 3: Foam Surface, EO  Sec,  Sway  Condition 4: Foam Surface, EC  Sec,  Sway    12/01/23 5 times sit to stand: 27.47 sec (R hand on  knee, uses legs against bed) 10 meter walk test: 17.03 sec with  Lars Balance Test: 27/56 TUG: Deferred today due to pt L hip fatigued and starting to hurt after functional testing MCTSIB: Deferred today due to pt L hip pain  GOALS: Goals reviewed with patient? Yes  SHORT TERM GOALS: Target date: 12/08/2023>>UPDATED STG 02/11/2024    Patient will be independent in HEP to improve functional outcomes Baseline: needs cues 01/19/2024 Goal status: IN PROGRESS, 01/19/2024  2.  Demo improved postural stability per mild-moderate sway x 30 sec condition 2 M-CTSIB to improve safety with ADL Baseline: 10 sec with mod-severe sway Goal status: IN PROGRESS, 01/19/2024  3.  Demo improved BLE strength and reduce risk for falls per time 15 sec 5xSTS test Baseline: 18 sec; 13 sec Goal status: MET    LONG TERM GOALS: Target date: 01/12/2024>>UPDATED TARGET 03/10/2024    Demo improved left hip abduction strength to 3+/5 for improved single limb support and to minimize gait dysfunction Baseline: 3-/5, Trendelenberg Goal status: IN PROGRESS, 01/19/2024  2.  Modified independent stair ambulation for improved safety with entering/exiting home Baseline: supervision BHR Goal status: IN PROGRESS, 01/19/2024  3.  Reduce risk for falls per score 48/56 Berg Balance Test Baseline 11/17/23: 42/56 12/01/23: 21/56; 01/19/2024 27/56 Goal status: IN PROGRESS, 01/19/2024  4.  Reduce risk for falls per time 14 sec TUG test Baseline: 19 sec w/ cane; 21 sec with 4WW 12/20/23 Goal status: IN PROGRESS 12/20/23  5.  Modified independent with uneven surfaces and curb negotiation to improve safety in community Baseline: CGA Goal status: IN PROGRESS 01/19/2024  6.  Pt will improve 5x sit<>stand to less than or equal to 13 sec to demonstrate improved functional strength and transfer  efficiency.  Baseline:  18 sec 01/19/2024  Goals status:  IN PROGRESS      ASSESSMENT:  CLINICAL IMPRESSION: Pt presents today, not having been seen since 12/20/2023 (this may be due to our scheduling, but also scheduling/transportation constraints on patient).  She is out of her initial POC, so goals assessed today and recert completed.  She reports balance continues to be a main concern for her; she reports walking at home without walker sometimes.  Skilled PT session focused on assessing objective measures and reviewing HEP.  Berg score of 27/56 and 5x sit to stand score of 18 seconds and gait velocity score of 1.59 ft/sec indicate increased fall risk.  She has not met goals fully, as she has not had therapy sessions in one month.  She would benefit from skilled PT towards goals to improve functional mobility, independence, and decreased fall risk.    OBJECTIVE IMPAIRMENTS: Abnormal gait, decreased activity tolerance, decreased balance, difficulty walking, decreased strength, and postural dysfunction.   ACTIVITY LIMITATIONS: carrying, lifting, standing, stairs, transfers, reach over head, and locomotion level  PARTICIPATION LIMITATIONS: meal prep, cleaning, laundry, interpersonal relationship, driving, shopping, and community activity  PERSONAL FACTORS: Age, Time since onset of injury/illness/exacerbation, and 1-2 comorbidities: PMH are also affecting patient's functional outcome.   REHAB POTENTIAL: Good  CLINICAL DECISION MAKING: Evolving/moderate complexity  EVALUATION COMPLEXITY: Moderate  PLAN:  PT FREQUENCY: 1-2x/week  PT DURATION: 8 weeks per recert 01/19/2024  PLANNED INTERVENTIONS: [id number]- PT Re-evaluation, 97750- Physical Performance Testing, 97110-Therapeutic exercises, 97530- Therapeutic activity, 97112- Neuromuscular re-education, 97535- Self Care, [id number]- Manual therapy, [phone]- Gait training, [phone]- Orthotic Initial, [phone]- Canalith repositioning, [id number]- Aquatic Therapy,  [phone]- Electrical stimulation (unattended), [phone] (1-2 muscles), [phone] (3+ muscles)- Dry Needling, Patient/Family education, Balance training, Stair training, Taping, Joint mobilization, Spinal mobilization, Vestibular training, Cryotherapy, and Moist heat  PLAN FOR NEXT SESSION: Review oculomotor HEP, vestibular training, strengthening, balance; progress HEP as appropriate   Greig Anon, PT 02/02/24 10:17 AM Phone: [phone] Fax: [phone]  [facility] [address] [place], [place] [address] Phone # [phone] Fax # [phone]

## 2024-02-03 ENCOUNTER — Ambulatory Visit: Admitting: Physical Therapy

## 2024-02-03 ENCOUNTER — Ambulatory Visit (INDEPENDENT_AMBULATORY_CARE_PROVIDER_SITE_OTHER)

## 2024-02-03 ENCOUNTER — Ambulatory Visit: Admitting: Occupational Therapy

## 2024-02-03 DIAGNOSIS — I639 Cerebral infarction, unspecified: Secondary | ICD-10-CM | POA: Diagnosis not present

## 2024-02-03 LAB — CUP PACEART REMOTE DEVICE CHECK
Date Time Interrogation Session: 20251001233626
Implantable Pulse Generator Implant Date: 20250512

## 2024-02-03 NOTE — Therapy (Incomplete)
 OUTPATIENT OCCUPATIONAL THERAPY NEURO  Treatment Note  Patient Name: Annette Gross MRN: [id number] DOB:06-Jan-1941, 83 y.o., female Today's Date: 02/03/2024  PCP: Dr. Sharie REFERRING PROVIDER: Gayland Lauraine PARAS, NP  END OF SESSION:      Past Medical History:  Diagnosis Date   Anemia    many yrs ago   Anxiety    Asthma    no recent issues   Cancer [facility])    s/p thyroidectomy 2018   Complication of anesthesia    Cystitis, interstitial    Degenerative disc disease, lumbar    Degenerative joint disease    spinal stenosis Chronic low back pain   Depression    Diabetes mellitus    diet control.   Diverticulitis 2005   Diverticulosis 2005   Factor V deficiency (HCC)    Fibromyalgia    Fracture of right foot 1995   GERD (gastroesophageal reflux disease)    History of hiatal hernia    Hyperlipidemia    Hypertension    off med x 5 yrs.   IBS (irritable bowel syndrome)    Insomnia    Iron deficiency    Liver hemangioma 1999   MRI   Mononucleosis    Morton's neuroma    Left foot   Nuclear sclerotic cataract of left eye 08/29/2019   Peripheral neuropathy    Pneumonia    years 89 & 89.  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   CHOLECYSTECTOMY     COLONOSCOPY WITH PROPOFOL  N/A 09/02/2015   Procedure: COLONOSCOPY WITH PROPOFOL ;  Surgeon: Gladis MARLA Louder, MD;  Location: [facility];  Service: Endoscopy;  Laterality: N/A;   GANGLION CYST EXCISION     right   HEMORRHOID SURGERY     HERNIA REPAIR     repair was aimed at the [facility]   HIATAL HERNIA REPAIR     and nissen fundoplication   LAPAROSCOPIC ESOPHAGOGASTRIC FUNDOPLASTY     LAPAROSCOPIC INCISIONAL / UMBILICAL / VENTRAL HERNIA REPAIR     umbilical hernia   LOOP  RECORDER INSERTION N/A 09/13/2023   Procedure: LOOP RECORDER INSERTION;  Surgeon: Leverne Charlies Helling, PA-C;  Location: [facility];  Service: Cardiovascular;  Laterality: N/A;   OOPHORECTOMY     left   RIGHT OOPHORECTOMY     '05-laparaoscopic   SHOULDER SURGERY Right    THYROIDECTOMY N/A 07/06/2016   Procedure: TOTAL THYROIDECTOMY;  Surgeon: Krystal Russell, MD;  Location: [facility] OR;  Service: General;  Laterality: N/A;   TONSILLECTOMY     TOTAL THYROIDECTOMY  07/06/2016   TRANSESOPHAGEAL ECHOCARDIOGRAM (CATH LAB) N/A 09/13/2023   Procedure: TRANSESOPHAGEAL ECHOCARDIOGRAM;  Surgeon: Mona Vinie BROCKS, MD;  Location: [facility];  Service: Cardiovascular;  Laterality: N/A;   Patient Active Problem List   Diagnosis Date Noted   Hypertension    CVA (cerebral vascular accident) (HCC) 09/10/2023   Stroke (cerebrum) (HCC) 09/10/2023   Pseudophakia, both eyes 10/13/2021   Dermatochalasis of both lower eyelids 10/13/2021   Moderate nonproliferative diabetic retinopathy of left eye (HCC) 10/10/2020   Moderate nonproliferative diabetic retinopathy of right eye (HCC) 08/29/2019   Early stage nonexudative age-related macular degeneration of both eyes 08/29/2019  Right posterior capsular opacification 08/29/2019   Left thyroid  nodule 07/06/2016   Benign neoplasm of skin of upper limb, including shoulder 04/19/2012   Ventral hernia, recurrent 01/26/2011    ONSET DATE: 09/10/23  REFERRING DIAG: I63.9 (ICD-10-CM) - Acute CVA (cerebrovascular accident) (HCC)  THERAPY DIAG:  No diagnosis found.  Rationale for Evaluation and Treatment: Rehabilitation  SUBJECTIVE:   SUBJECTIVE STATEMENT: Pt reports that things have been going pretty well.  Pt reports that she has the occasional dizziness that starts in the morning, but does not seem to impact her exercises.    Pt reports having difficulty with coordination and eye/hand coordination.   Pt accompanied by: self (dropped off by  friend)  PERTINENT HISTORY: Presented to the ER 09/10/2023 after waking with impaired vision to her left eye, she could only see a crescent shaped area at the top of her field of vision. A right lower quadrantanopsia was also noted. She went through her ophthalmologist was told she had a central retinal artery occlusion and was sent to the ER. She was admitted for painless loss of vision to the central field of the left eye. On MRI found to have multiple embolic appearing ischemic strokes.  PMH of DM II, anxiety, cancer, DJD, DM, fibromyalgia, HTN, HLD, IBS  PRECAUTIONS: Fall  WEIGHT BEARING RESTRICTIONS: No  PAIN:  Are you having pain? Yes: NPRS scale: 2 Pain location: R knee  - had a shot in it last week Pain description: aching  FALLS: Has patient fallen in last 6 months? Yes. Number of falls 2 - one fall down the basement stairs and 1 this morning getting down the steps when walking to the car  LIVING ENVIRONMENT: Lives with: lives with their family (son and his partner) Lives in: House/apartment Stairs: Yes: Internal: there are stairs to the basement, but does not need to go down those steps; and External: 6 steps; bilateral but cannot reach both Has following equipment at home: Single point cane, Walker - 4 wheeled, and shower chair  PLOF: Independent, Independent with basic ADLs, and Leisure: enjoys drawing and painting  PATIENT GOALS: to be aware of vision, handwriting  OBJECTIVE:  Note: Objective measures were completed at Evaluation unless otherwise noted.  HAND DOMINANCE: Ambidextrous - writes with R hand, eats with L hand  ADLs: Transfers/ambulation related to ADLs: Uses Rollator in the home to transport items, SPC in the community Eating: reports decreased depth perception when pouring drink into cup, reports having to look at food when scooping Grooming: Mod I UB Dressing: Mod I - does have difficulty with bra but is able to complete without assist LB Dressing: Mod  I Toileting: Mod I Bathing: Mod I  Tub Shower transfers: Mod I Equipment: Shower seat without back  IADLs: Light housekeeping: helps with the dishes, emptying dishwasher, sweeping, folding and puttying away laundry Meal Prep: simple meal prep with sandwiches and cereal, is not primary cook now that she lives with family of 5 Community mobility: not cleared to resume driving s/p CVA Medication management: plans to get a pill box to organize all meds Handwriting: 100% legible and 17.5 sec for PPT #1 ([place]  MOBILITY STATUS: Hx of falls, uses Rollator in home occasionally and SPC in community  POSTURE COMMENTS:  Scoliosis with pelvic obliquity  ACTIVITY TOLERANCE: Activity tolerance: diminished  FUNCTIONAL OUTCOME MEASURES: 9 hole peg test: 9 Hole Peg test: Right: 34.72 sec; Left: 30.94 sec (pt asking about next step after filling peg  board - ?difficulty with 2 step commands)  PSFS: TBD  UPPER EXTREMITY ROM:    Active ROM Right eval Left eval  Shoulder flexion 142 112 ( reports she is awaiting surgery)  Shoulder abduction    Shoulder adduction    Shoulder extension    Shoulder internal rotation [facility]  Shoulder external rotation [facility]  Elbow flexion [facility] WFL  Elbow extension [facility] WFL  Wrist flexion [facility] WFL  Wrist extension [facility] WFL  Wrist ulnar deviation    Wrist radial deviation    Wrist pronation    Wrist supination    (Blank rows = not tested)  UPPER EXTREMITY MMT:     MMT Right eval Left eval  Shoulder flexion 4- 3+(reports awaiting surgery)  Shoulder abduction    Shoulder adduction    Shoulder extension    Shoulder internal rotation    Shoulder external rotation    Middle trapezius    Lower trapezius    Elbow flexion    Elbow extension    Wrist flexion    Wrist extension    Wrist ulnar deviation    Wrist radial deviation    Wrist pronation    Wrist supination    (Blank rows = not tested)  COORDINATION: Finger Nose Finger  test: [facility] 9 Hole Peg test: Right: 34.72 sec; Left: 30.94 sec (pt asking about next step after filling peg board - ?difficulty with 2 step commands) Box and Blocks:  Right 41 blocks, Left 43 blocks (hit barrier x3 with R, x2 with L)  12/16/23 9 hole peg test: Right: 31.56 sec; Left: 29.25   SENSATION: WFL   COGNITION: Overall cognitive status: Pt reports thinking skills are not quite the same (reports difficulty with crossword puzzles than before) and reports increased time to think through something.  VISION: Subjective report: vision is improving Baseline vision: Wears glasses for reading only Visual history: cataracts (s/p removal)  VISION ASSESSMENT: Impaired Ocular ROM: WFL Gaze preference/alignment: WDL Tracking/Visual pursuits: Able to track stimulus in all quads without difficulty Saccades: additional eye shifts occurred during testing Visual Fields: Right visual field deficits Depth perception: pt reports decreased depth perception, did not recognize stimulus until nearly at midline on L side Some decreased recognition of simulus on L vision field with confrontation testing  Patient has difficulty with following activities due to following visual impairments: embroidering, pouring drink into cup, handwriting                                                                                                                            TREATMENT DATE:  02/03/24 Peg board pattern and pattern with key   01/19/24 Bell cancellation: pt locating 31 of 35 bells in 3:13.  Pt demonstrating good linear scanning, however still omitting 4.Pt omitting bells primarily in middle of picture.  Upon review, pt stating that she had a hard time seeing the missing ones and reports that when she is reading that she will  occasionally miss the middle. Trail making: completed Trail A in 52.72 seconds with no errors.  Completed Trail B in 2:14 with no errors.  Pt picking up pen intermittently but no  errors or doubling back.   Handwriting: Pt writing name and simple sentence with 100% legibility.  Pt reports that it is worse than it used to be and feels that it is her coordination that is impacting it. Pegs: Right: 30.0 sec and Left: 29.5 sec.  Discussed norms with pt expressing desire to continue to work with coordination. Coordination: provided pt with coordination HEP (see pt instructions) and providing demonstration of each and modifications to card tasks to increase cognitive challenge and alternating attention.       12/23/23 Engaged in small peg board pattern replication with use of key to identify correlating colors, challenging alternating attention, visual perception and attention to L environment as picture placed in L visual field.  Pt initially scanning in linear fashion, however about halfway through pt getting off line requiring cues to problem solve errors.  OT educated on use of bookmark for line follow to decrease stimulus and cues to fully scan to L to ensure all visual information.  Pt omitting 2 pegs on far left initially, but with cues to scan to edge of page and with bookmark pt with no additional errors.  Educated on functional carryover of task implications with systematic process to minimize onset of errors.  Pt reports having some errors when reading or following recipes. Vision: engaged in scanning nonsensical words from L to R to identify letters in alphabetical order.  OT placed high lighter line on L side of passage to facilitate scanning fully to L before beginning to scan linearly. Noted pt to skip a line towards the end, but with increased time able to recognize error and correct.   Spot It: engaged in visual scanning task challenging pt to locate 3 cards with matching icon between field of 9 cards with mixed icons on each.  Pt requiring min increased time to scan cards to locate matching icon, especially recognizing cards on L.   12/16/23 Bell cancellation test: pt  locating 31/35 bells in 2:44.  Pt omitting bells primarily in middle of picture and 1 to far, lower right.  Upon review, pt stating that she had a hard time seeing the missing ones due to too close to another object and disappearing into mixed background.   Trail making: Trail making A: 1:37. Pt picking up pen x2 and requiring increased time with sequencing after 14.  Attempted Trail B, stopping after 2:40 at [facility] after multiple omitted letters and numbers.  Pt demonstrating difficulty with alternating attention between numbers and letters.  Completed additional attempt with alternative printout and pt wearing her reading glasses.  Pt completing in 3:28 with one error, skipping number 2.  OT educating on functional carryover of alternating attention and educating on decreasing extra stimulus and focus on one task at a time due to challenges present during alternating attention task this session.   9 hole peg test: Right: 31.56 sec; Left: 29.25.  Pt with no issues with recall of 2 step directions and no change in coordination s/p new CVA Vision: OT educated pt on lighthouse strategy with scanning head and eyes to Left during mobility (particularly in unfamiliar and/or cluttered environment), having person walk on L to faciltiate scanning to L, setting up grooming items to L to increase scanning to L.  OT also recommending use of hand placement  or other anchor on L side of paper or page when reading to fully scan to Left.  Recommended use of bookmark for increased ease with line follow as pt with difficulty with scanning during Bell cancellation task due to increased stimulation.  Provided with handout.   PATIENT EDUCATION: Education details: vision and alternating attention Person educated: Patient Education method: Explanation, Verbal cues, and Handouts Education comprehension: verbalized understanding and needs further education  HOME EXERCISE PROGRAM: 11/17/23 - depth perception activities (see pt  instructions)   GOALS: Goals reviewed with patient? Yes   SHORT TERM GOALS: Target date: 02/11/24  Pt will be independent with vision HEP with use of handouts. Baseline: new to OPOT Goal status: in progress  2.  Pt will be independent with coordination HEP with use of handouts. Baseline: new to OPOT Goal status:  in progress  4.  Pt will improve visual scanning/use compensatory strategies to perform cancellation task with 92% accuracy.  Baseline: 31/35 on Bell cancellation task Goal status:  REVISED  LONG TERM GOALS: Target date: 03/03/24  1.Pt will demonstrate improved fine motor coordination for ADLs as evidenced by decreasing 9 hole peg test score for RUE by 3 secs from 01/19/24 data. Baseline: Right: 34.72 sec; Left: 30.94 sec  12/16/23: Right: 31.56 sec 01/19/24: Right: 30.0 sec, Left: 29.5 sec Goal status: REVISED  2.  Pt will navigate a moderately busy environment, completing dual task activity and/or following multi-step commands with 90% accuracy. Baseline: difficulty with 2 step commands Goal status: on going  3.  Pt will demonstrate improved coordination to allow for improved speed with handwriting to completing simple sentence in <15 seconds without compromising 100% legibility. Baseline: 17.5 sec 01/19/24: 18.25 sec with 100% legibility Goal status: on going  4.  Pt will verbalize and/or demonstrate understanding of energy conservation strategies to increase engagement in and safety with ADLs and IADLs.  Baseline: new to OPOT Goal status: on going   ASSESSMENT:  CLINICAL IMPRESSION: Patient is a 83 y.o. female who was seen today for occupational therapy treatment session with focus on vision and coordination s/p CVA. Pt continues to demonstrate visual field impairment, however much more prevalent in middle of paper, both from R <>L and top <> bottom.  Pt reports frequently missing words or information in the middle section of the page, ?attention vs vision. Pt  receptive to education on use of bookmark for line follow to reduce skipping lines.  Pt has had limited participation in therapy sessions due to transportation availability, as pt relies on son who works.  Pt will continue to benefit from skilled occupational therapy services to address coordination, GM/FM control, cognition, safety awareness, introduction of compensatory strategies/AE prn, visual-perception, and implementation of an HEP to improve participation and safety during ADLs and IADLs.    PERFORMANCE DEFICITS: in functional skills including ADLs, IADLs, coordination, pain, Fine motor control, Gross motor control, balance, body mechanics, endurance, decreased knowledge of precautions, decreased knowledge of use of DME, vision, and UE functional use, cognitive skills including memory and safety awareness, and psychosocial skills including coping strategies, environmental adaptation, and routines and behaviors.     PLAN:  OT FREQUENCY: 1-2x/week  OT DURATION: 6 weeks  PLANNED INTERVENTIONS: 97168 OT Re-evaluation, 97535 self care/ADL training, 02889 therapeutic exercise, 97530 therapeutic activity, 97112 neuromuscular re-education, 97140 manual therapy, balance training, functional mobility training, visual/perceptual remediation/compensation, psychosocial skills training, energy conservation, coping strategies training, patient/family education, and DME and/or AE instructions  RECOMMENDED OTHER SERVICES: NA  CONSULTED AND  AGREED WITH PLAN OF CARE: Patient  PLAN FOR NEXT SESSION:  further explore depth perception and L attention during functional and table top tasks, 2-3 step directions for recall, educate on energy conservation.    Coordination, vision, attention   KAYLENE DOMINO, OTR/L 02/03/2024, 1:18 PM  [facility] at [facility] [address] [place], [address] [place], [place] [address] Phone # [phone] Fax # [phone]

## 2024-02-07 DIAGNOSIS — S50812A Abrasion of left forearm, initial encounter: Secondary | ICD-10-CM | POA: Diagnosis not present

## 2024-02-07 DIAGNOSIS — L0390 Cellulitis, unspecified: Secondary | ICD-10-CM | POA: Diagnosis not present

## 2024-02-07 DIAGNOSIS — Z23 Encounter for immunization: Secondary | ICD-10-CM | POA: Diagnosis not present

## 2024-02-07 NOTE — Progress Notes (Signed)
 Remote Loop Recorder Transmission

## 2024-02-09 ENCOUNTER — Ambulatory Visit: Attending: Neurology

## 2024-02-09 ENCOUNTER — Encounter: Payer: Self-pay | Admitting: Physical Therapy

## 2024-02-09 ENCOUNTER — Ambulatory Visit: Admitting: Physical Therapy

## 2024-02-09 DIAGNOSIS — R2681 Unsteadiness on feet: Secondary | ICD-10-CM | POA: Insufficient documentation

## 2024-02-09 DIAGNOSIS — M6281 Muscle weakness (generalized): Secondary | ICD-10-CM | POA: Diagnosis present

## 2024-02-09 DIAGNOSIS — R41840 Attention and concentration deficit: Secondary | ICD-10-CM | POA: Diagnosis present

## 2024-02-09 DIAGNOSIS — R262 Difficulty in walking, not elsewhere classified: Secondary | ICD-10-CM | POA: Diagnosis present

## 2024-02-09 DIAGNOSIS — R2689 Other abnormalities of gait and mobility: Secondary | ICD-10-CM

## 2024-02-09 DIAGNOSIS — R42 Dizziness and giddiness: Secondary | ICD-10-CM | POA: Diagnosis present

## 2024-02-09 DIAGNOSIS — R4701 Aphasia: Secondary | ICD-10-CM | POA: Diagnosis present

## 2024-02-09 DIAGNOSIS — R278 Other lack of coordination: Secondary | ICD-10-CM | POA: Insufficient documentation

## 2024-02-09 DIAGNOSIS — R41842 Visuospatial deficit: Secondary | ICD-10-CM | POA: Insufficient documentation

## 2024-02-09 DIAGNOSIS — R29898 Other symptoms and signs involving the musculoskeletal system: Secondary | ICD-10-CM | POA: Diagnosis present

## 2024-02-09 NOTE — Therapy (Addendum)
 OUTPATIENT OCCUPATIONAL THERAPY NEURO  Treatment Note  Patient Name: Annette Gross MRN: [id number] DOB:1940-05-30, 83 y.o., female Today's Date: 02/09/2024  PCP: Dr. Sharie REFERRING PROVIDER: Gayland Lauraine PARAS, NP  END OF SESSION:   02/09/24 1627  OT Visits / Re-Eval  Visit Number 5  Number of Visits 13  Date for Recertification  03/03/24  Authorization  Authorization Type [employer] 2025  OT Time Calculation  OT Start Time 1530  OT Stop Time 1615  OT Time Calculation (min) 45 min  End of Session  Equipment Utilized During Treatment yellow theraputty  Activity Tolerance Patient tolerated treatment well  Behavior During Therapy WFL for tasks assessed/performed       Past Medical History:  Diagnosis Date   Anemia    many yrs ago   Anxiety    Asthma    no recent issues   Cancer (HCC)    s/p thyroidectomy 2018   Complication of anesthesia    Cystitis, interstitial    Degenerative disc disease, lumbar    Degenerative joint disease    spinal stenosis Chronic low back pain   Depression    Diabetes mellitus    diet control.   Diverticulitis 2005   Diverticulosis 2005   Factor V deficiency (HCC)    Fibromyalgia    Fracture of right foot 1995   GERD (gastroesophageal reflux disease)    History of hiatal hernia    Hyperlipidemia    Hypertension    off med x 5 yrs.   IBS (irritable bowel syndrome)    Insomnia    Iron deficiency    Liver hemangioma 1999   MRI   Mononucleosis    Morton's neuroma    Left foot   Nuclear sclerotic cataract of left eye 08/29/2019   Peripheral neuropathy    Pneumonia    years 89 & 89.  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   CHOLECYSTECTOMY     COLONOSCOPY WITH PROPOFOL   N/A 09/02/2015   Procedure: COLONOSCOPY WITH PROPOFOL ;  Surgeon: Gladis MARLA Louder, MD;  Location: [facility];  Service: Endoscopy;  Laterality: N/A;   GANGLION CYST EXCISION     right   HEMORRHOID SURGERY     HERNIA REPAIR     repair was aimed at the [facility]   HIATAL HERNIA REPAIR     and nissen fundoplication   LAPAROSCOPIC ESOPHAGOGASTRIC FUNDOPLASTY     LAPAROSCOPIC INCISIONAL / UMBILICAL / VENTRAL HERNIA REPAIR     umbilical hernia   LOOP RECORDER INSERTION N/A 09/13/2023   Procedure: LOOP RECORDER INSERTION;  Surgeon: Leverne Charlies Helling, PA-C;  Location: [facility];  Service: Cardiovascular;  Laterality: N/A;   OOPHORECTOMY     left   RIGHT OOPHORECTOMY     '05-laparaoscopic   SHOULDER SURGERY Right    THYROIDECTOMY N/A 07/06/2016   Procedure: TOTAL THYROIDECTOMY;  Surgeon: Krystal Russell, MD;  Location: [facility] OR;  Service: General;  Laterality: N/A;   TONSILLECTOMY     TOTAL THYROIDECTOMY  07/06/2016   TRANSESOPHAGEAL ECHOCARDIOGRAM (CATH LAB) N/A 09/13/2023   Procedure: TRANSESOPHAGEAL ECHOCARDIOGRAM;  Surgeon: Mona Vinie BROCKS, MD;  Location: [facility];  Service: Cardiovascular;  Laterality:  N/A;   Patient Active Problem List   Diagnosis Date Noted   Hypertension    CVA (cerebral vascular accident) (HCC) 09/10/2023   Stroke (cerebrum) (HCC) 09/10/2023   Pseudophakia, both eyes 10/13/2021   Dermatochalasis of both lower eyelids 10/13/2021   Moderate nonproliferative diabetic retinopathy of left eye (HCC) 10/10/2020   Moderate nonproliferative diabetic retinopathy of right eye (HCC) 08/29/2019   Early stage nonexudative age-related macular degeneration of both eyes 08/29/2019   Right posterior capsular opacification 08/29/2019   Left thyroid  nodule 07/06/2016   Benign neoplasm of skin of upper limb, including shoulder 04/19/2012   Ventral hernia, recurrent 01/26/2011    ONSET DATE: 09/10/23  REFERRING DIAG: I63.9 (ICD-10-CM) - Acute CVA (cerebrovascular accident)  (HCC)  THERAPY DIAG:  Other lack of coordination  Attention and concentration deficit  Other symptoms and signs involving the musculoskeletal system  Visuospatial deficit  Muscle weakness (generalized)  Rationale for Evaluation and Treatment: Rehabilitation  SUBJECTIVE:   SUBJECTIVE STATEMENT: Pt reports that things have been going pretty well. Reports continued participation with coordination, stated that she had difficulty finding small items but is using acorns. Pt reported dizziness this date, assessed BP 156/89 and HR 87.    Pt accompanied by: self (dropped off by friend)  PERTINENT HISTORY: Presented to the ER 09/10/2023 after waking with impaired vision to her left eye, she could only see a crescent shaped area at the top of her field of vision. A right lower quadrantanopsia was also noted. She went through her ophthalmologist was told she had a central retinal artery occlusion and was sent to the ER. She was admitted for painless loss of vision to the central field of the left eye. On MRI found to have multiple embolic appearing ischemic strokes.  PMH of DM II, anxiety, cancer, DJD, DM, fibromyalgia, HTN, HLD, IBS  PRECAUTIONS: Fall  WEIGHT BEARING RESTRICTIONS: No  PAIN:  Are you having pain? Yes: NPRS scale: 2 Pain location: R knee  - had a shot in it last week Pain description: aching  FALLS: Has patient fallen in last 6 months? Yes. Number of falls 2 - one fall down the basement stairs and 1 this morning getting down the steps when walking to the car  LIVING ENVIRONMENT: Lives with: lives with their family (son and his partner) Lives in: House/apartment Stairs: Yes: Internal: there are stairs to the basement, but does not need to go down those steps; and External: 6 steps; bilateral but cannot reach both Has following equipment at home: Single point cane, Walker - 4 wheeled, and shower chair  PLOF: Independent, Independent with basic ADLs, and Leisure: enjoys  drawing and painting  PATIENT GOALS: to be aware of vision, handwriting  OBJECTIVE:  Note: Objective measures were completed at Evaluation unless otherwise noted.  HAND DOMINANCE: Ambidextrous - writes with R hand, eats with L hand  ADLs: Transfers/ambulation related to ADLs: Uses Rollator in the home to transport items, SPC in the community Eating: reports decreased depth perception when pouring drink into cup, reports having to look at food when scooping Grooming: Mod I UB Dressing: Mod I - does have difficulty with bra but is able to complete without assist LB Dressing: Mod I Toileting: Mod I Bathing: Mod I  Tub Shower transfers: Mod I Equipment: Shower seat without back  IADLs: Light housekeeping: helps with the dishes, emptying dishwasher, sweeping, folding and puttying away laundry Meal Prep: simple meal prep with sandwiches and cereal, is not primary cook now that  she lives with family of 5 Community mobility: not cleared to resume driving s/p CVA Medication management: plans to get a pill box to organize all meds Handwriting: 100% legible and 17.5 sec for PPT #1 ([place]  MOBILITY STATUS: Hx of falls, uses Rollator in home occasionally and SPC in community  POSTURE COMMENTS:  Scoliosis with pelvic obliquity  ACTIVITY TOLERANCE: Activity tolerance: diminished  FUNCTIONAL OUTCOME MEASURES: 9 hole peg test: 9 Hole Peg test: Right: 34.72 sec; Left: 30.94 sec (pt asking about next step after filling peg board - ?difficulty with 2 step commands)  PSFS: TBD  UPPER EXTREMITY ROM:    Active ROM Right eval Left eval  Shoulder flexion 142 112 ( reports she is awaiting surgery)  Shoulder abduction    Shoulder adduction    Shoulder extension    Shoulder internal rotation [facility]  Shoulder external rotation [facility]  Elbow flexion [facility] WFL  Elbow extension [facility] WFL  Wrist flexion [facility] WFL  Wrist extension [facility] WFL  Wrist ulnar deviation    Wrist  radial deviation    Wrist pronation    Wrist supination    (Blank rows = not tested)  UPPER EXTREMITY MMT:     MMT Right eval Left eval  Shoulder flexion 4- 3+(reports awaiting surgery)  Shoulder abduction    Shoulder adduction    Shoulder extension    Shoulder internal rotation    Shoulder external rotation    Middle trapezius    Lower trapezius    Elbow flexion    Elbow extension    Wrist flexion    Wrist extension    Wrist ulnar deviation    Wrist radial deviation    Wrist pronation    Wrist supination    (Blank rows = not tested)  COORDINATION: Finger Nose Finger test: [facility] 9 Hole Peg test: Right: 34.72 sec; Left: 30.94 sec (pt asking about next step after filling peg board - ?difficulty with 2 step commands) Box and Blocks:  Right 41 blocks, Left 43 blocks (hit barrier x3 with R, x2 with L)  12/16/23 9 hole peg test: Right: 31.56 sec; Left: 29.25   SENSATION: WFL   COGNITION: Overall cognitive status: Pt reports thinking skills are not quite the same (reports difficulty with crossword puzzles than before) and reports increased time to think through something.  VISION: Subjective report: vision is improving Baseline vision: Wears glasses for reading only Visual history: cataracts (s/p removal)  VISION ASSESSMENT: Impaired Ocular ROM: WFL Gaze preference/alignment: WDL Tracking/Visual pursuits: Able to track stimulus in all quads without difficulty Saccades: additional eye shifts occurred during testing Visual Fields: Right visual field deficits Depth perception: pt reports decreased depth perception, did not recognize stimulus until nearly at midline on L side Some decreased recognition of simulus on L vision field with confrontation testing  Patient has difficulty with following activities due to following visual impairments: embroidering, pouring drink into cup, handwriting  TREATMENT DATE:  02/09/24 Pt reported dizziness: 156/89 BP and 87 HR. PT notified. Monitored throughout tx.  Educated pt in energy conservation, see Pt instructions for detailed handout to promote safety and quality of life in the home.   Educated pt in obtaining activities to address visual tracking as well as FM coordination and alternating attention online (ie: spot the difference, word searches).    Completed visual tracking test, located 7/8 of designated letters on first test, then 28/31 numbers on second test (test originally had 32 but one was cut off d/t the copier).   Educated pt in final coordination activity for carryover with handwriting tasks, instructed to find 4-5 small items in light resistance yellow theraputty. Pt instructed in proper care and storage of putty. Measured BP once more, 124/89 and 89 HR. PT notified of vitals.  01/19/24 Bell cancellation: pt locating 31 of 35 bells in 3:13.  Pt demonstrating good linear scanning, however still omitting 4.Pt omitting bells primarily in middle of picture.  Upon review, pt stating that she had a hard time seeing the missing ones and reports that when she is reading that she will occasionally miss the middle. Trail making: completed Trail A in 52.72 seconds with no errors.  Completed Trail B in 2:14 with no errors.  Pt picking up pen intermittently but no errors or doubling back.   Handwriting: Pt writing name and simple sentence with 100% legibility.  Pt reports that it is worse than it used to be and feels that it is her coordination that is impacting it. Pegs: Right: 30.0 sec and Left: 29.5 sec.  Discussed norms with pt expressing desire to continue to work with coordination. Coordination: provided pt with coordination HEP (see pt instructions) and providing demonstration of each and modifications to card tasks to increase cognitive challenge and alternating attention.        12/23/23 Engaged in small peg board pattern replication with use of key to identify correlating colors, challenging alternating attention, visual perception and attention to L environment as picture placed in L visual field.  Pt initially scanning in linear fashion, however about halfway through pt getting off line requiring cues to problem solve errors.  OT educated on use of bookmark for line follow to decrease stimulus and cues to fully scan to L to ensure all visual information.  Pt omitting 2 pegs on far left initially, but with cues to scan to edge of page and with bookmark pt with no additional errors.  Educated on functional carryover of task implications with systematic process to minimize onset of errors.  Pt reports having some errors when reading or following recipes. Vision: engaged in scanning nonsensical words from L to R to identify letters in alphabetical order.  OT placed high lighter line on L side of passage to facilitate scanning fully to L before beginning to scan linearly. Noted pt to skip a line towards the end, but with increased time able to recognize error and correct.   Spot It: engaged in visual scanning task challenging pt to locate 3 cards with matching icon between field of 9 cards with mixed icons on each.  Pt requiring min increased time to scan cards to locate matching icon, especially recognizing cards on L.   12/16/23 Bell cancellation test: pt locating 31/35 bells in 2:44.  Pt omitting bells primarily in middle of picture and 1 to far, lower right.  Upon review, pt stating that she had a hard time seeing the missing ones due  to too close to another object and disappearing into mixed background.   Trail making: Trail making A: 1:37. Pt picking up pen x2 and requiring increased time with sequencing after 14.  Attempted Trail B, stopping after 2:40 at [facility] after multiple omitted letters and numbers.  Pt demonstrating difficulty with alternating attention between  numbers and letters.  Completed additional attempt with alternative printout and pt wearing her reading glasses.  Pt completing in 3:28 with one error, skipping number 2.  OT educating on functional carryover of alternating attention and educating on decreasing extra stimulus and focus on one task at a time due to challenges present during alternating attention task this session.   9 hole peg test: Right: 31.56 sec; Left: 29.25.  Pt with no issues with recall of 2 step directions and no change in coordination s/p new CVA Vision: OT educated pt on lighthouse strategy with scanning head and eyes to Left during mobility (particularly in unfamiliar and/or cluttered environment), having person walk on L to faciltiate scanning to L, setting up grooming items to L to increase scanning to L.  OT also recommending use of hand placement or other anchor on L side of paper or page when reading to fully scan to Left.  Recommended use of bookmark for increased ease with line follow as pt with difficulty with scanning during Bell cancellation task due to increased stimulation.  Provided with handout.   PATIENT EDUCATION: Education details: Energy conservation Person educated: Patient Education method: Explanation, Verbal cues, and Handouts Education comprehension: verbalized understanding and needs further education  HOME EXERCISE PROGRAM: 11/17/23 - depth perception activities (see pt instructions)   GOALS: Goals reviewed with patient? Yes  SHORT TERM GOALS: Target date: 12/17/23  Pt will be independent with vision HEP with use of handouts. Baseline: new to OPOT Goal status: in progress  2.  Pt will be independent with coordination HEP with use of handouts. Baseline: new to OPOT Goal status:  in progress  3.  Pt will verbalize understanding of compensatory visual scanning strategies to increase attention to L visual field. Baseline: new to OPOT Goal status:  in progress  4.  Pt will improve  visual scanning/use compensatory strategies to perform cancellation task with 75% accuracy.  Baseline: 31/35 on Bell cancellation task Goal status:  in progress   LONG TERM GOALS: Target date: 01/14/24  Pt will demonstrate improved fine motor coordination for ADLs as evidenced by decreasing 9 hole peg test score for RUE by 3 secs Baseline: Right: 34.72 sec; Left: 30.94 sec  12/16/23: Right: 31.56 sec 01/19/24: Right: 30.0 sec Goal status: MET  2.  Pt will navigate a moderately busy environment, completing dual task activity and/or following multi-step commands with 90% accuracy. Baseline: difficulty with 2 step commands Goal status: INITIAL  3.  Pt will demonstrate improved coordination to allow for improved speed with handwriting to completing simple sentence in <15 seconds without compromising 100% legibility. Baseline: 17.5 sec 01/19/24: 18.25 sec with 100% legibility Goal status: INITIAL  4.  Pt will verbalize and/or demonstrate understanding of energy conservation strategies to increase engagement in and safety with ADLs and IADLs.  Baseline: new to OPOT Goal status: INITIAL  SHORT TERM GOALS: Target date: 02/11/24  Pt will be independent with vision HEP with use of handouts. Baseline: new to OPOT Goal status: in progress  2.  Pt will be independent with coordination HEP with use of handouts. Baseline: new to OPOT Goal status:  in progress  4.  Pt will improve visual scanning/use compensatory strategies to perform cancellation task with 92% accuracy.  Baseline: 31/35 on Bell cancellation task Goal status:  REVISED  LONG TERM GOALS: Target date: 03/03/24  1.Pt will demonstrate improved fine motor coordination for ADLs as evidenced by decreasing 9 hole peg test score for RUE by 3 secs from 01/19/24 data. Baseline: Right: 34.72 sec; Left: 30.94 sec  12/16/23: Right: 31.56 sec 01/19/24: Right: 30.0 sec, Left: 29.5 sec Goal status: REVISED  2.  Pt will navigate a moderately  busy environment, completing dual task activity and/or following multi-step commands with 90% accuracy. Baseline: difficulty with 2 step commands Goal status: on going  3.  Pt will demonstrate improved coordination to allow for improved speed with handwriting to completing simple sentence in <15 seconds without compromising 100% legibility. Baseline: 17.5 sec 01/19/24: 18.25 sec with 100% legibility Goal status: on going  4.  Pt will verbalize and/or demonstrate understanding of energy conservation strategies to increase engagement in and safety with ADLs and IADLs.  Baseline: new to OPOT Goal status: on going   ASSESSMENT:  CLINICAL IMPRESSION: Patient is a 83 y.o. female who was seen today for occupational therapy treatment session with focus on vision and coordination s/p CVA. Pt continues to demonstrate visual field impairment, more prevalent on R side of paper this date. Pt verbalized understanding of energy conservation techniques and activities that are able to be found online to address visual deficits and alternating attention. Pt at this time would benefit from continued skilled services to improve safety secondary to visual impairment and promote improved FM coordination for pt's desire to improve handwriting.   PERFORMANCE DEFICITS: in functional skills including ADLs, IADLs, coordination, pain, Fine motor control, Gross motor control, balance, body mechanics, endurance, decreased knowledge of precautions, decreased knowledge of use of DME, vision, and UE functional use, cognitive skills including memory and safety awareness, and psychosocial skills including coping strategies, environmental adaptation, and routines and behaviors.     PLAN:  OT FREQUENCY: 1-2x/week  OT DURATION: 6 weeks  PLANNED INTERVENTIONS: 97168 OT Re-evaluation, 97535 self care/ADL training, 02889 therapeutic exercise, 97530 therapeutic activity, 97112 neuromuscular re-education, 97140 manual therapy,  balance training, functional mobility training, visual/perceptual remediation/compensation, psychosocial skills training, energy conservation, coping strategies training, patient/family education, and DME and/or AE instructions  RECOMMENDED OTHER SERVICES: NA  CONSULTED AND AGREED WITH PLAN OF CARE: Patient  PLAN FOR NEXT SESSION:  further explore depth perception and L attention during functional and table top tasks, 2-3 step directions for recall, alternating attention tasks      Rocky Dutch, OTR/L 02/09/2024, 3:33 PM  [facility] [address] [place], [place] [address] Phone # [phone] Fax # [phone]

## 2024-02-09 NOTE — Patient Instructions (Signed)
 Energy Conservation Techniques  Sit for as many activities as possible. Use slow, smooth movements.  Rushing increases discomfort. Determine the necessity of performing the task.  Simplify those tasks that are necessary.  (Get clothes out of the dryer when they are warm instead of ironing, let dishes air dry, etc.) Take frequent rests both during and between activities.  Avoid repetitive tasks. Pre-plan your activities; try a daily and/or weekly schedule.  Spread out the activities that are most fatiguing (break up cleaning tasks over multiple days). Remember to plan a balance of work, rest and recreation. Consider the best time for each activity.  Do the most exertive task when you have the most energy. Don't carry items if you can push them.  Slide, don't lift. Push, don't pull. Utilize two hands when appropriate. Maintain good posture and use proper body mechanics. Avoid remaining in one position for too long. When lifting, bend at the knees, not at the waist.  Exhale when bending down, inhale when straightening up. Carry objects as close to your body and as near to the center of the pelvis.  11. Avoid wasted body movements (position yourself for the task so that you avoid bending, twisting, etc. when possible). 12. Select the best working environment.  Consider lighting, ventilation, clothing, and equipment. 13. Organize your storage areas, making the items you use daily convenient.  Store heaviest items at waist height.  Store frequently used items between shoulders and knee height.  Consider leaving frequently used items on countertops.  (You can organize in storage baskets based on time used/purpose). 14. Feelings and emotions can be real causes of fatigue.  Try to avoid unnecessary worry, irritation, or frustration.  Avoid stress, it can also be a source of fatigue. 15. Get help from other people for difficult tasks. 16. Explore equipment or items that may be able to do the job for you  with greater ease.  (Electric can openers, blenders, lightweight items for cleaning, etc.)    THE FOUR P's Pacing Prioritizing Planning Pursed lip breathing

## 2024-02-09 NOTE — Therapy (Signed)
 OUTPATIENT PHYSICAL THERAPY TREATMENT NOTE   Patient Name: Annette Gross MRN: [id number] DOB:1940-07-16, 83 y.o., female Today's Date: 02/09/2024   PCP: Alys Schuyler HERO, PA REFERRING PROVIDER: Gayland Lauraine PARAS, NP     END OF SESSION:  PT End of Session - 02/09/24 1612     Visit Number 8    Number of Visits 21    Date for Recertification  03/10/24    Authorization Type [employer]-    Authorization Time Period Auth#: [id number] , approved 6 PT visits from 01/19/2024 to 02/16/2024.    Authorization - Visit Number 2    Authorization - Number of Visits 6    Progress Note Due on Visit 17    PT Start Time 1619    PT Stop Time 1700    PT Time Calculation (min) 41 min    Equipment Utilized During Treatment Gait belt    Activity Tolerance Patient tolerated treatment well    Behavior During Therapy WFL for tasks assessed/performed               Past Medical History:  Diagnosis Date   Anemia    many yrs ago   Anxiety    Asthma    no recent issues   Cancer (HCC)    s/p thyroidectomy 2018   Complication of anesthesia    Cystitis, interstitial    Degenerative disc disease, lumbar    Degenerative joint disease    spinal stenosis Chronic low back pain   Depression    Diabetes mellitus    diet control.   Diverticulitis 2005   Diverticulosis 2005   Factor V deficiency (HCC)    Fibromyalgia    Fracture of right foot 1995   GERD (gastroesophageal reflux disease)    History of hiatal hernia    Hyperlipidemia    Hypertension    off med x 5 yrs.   IBS (irritable bowel syndrome)    Insomnia    Iron deficiency    Liver hemangioma 1999   MRI   Mononucleosis    Morton's neuroma    Left foot   Nuclear sclerotic cataract of left eye 08/29/2019   Peripheral neuropathy    Pneumonia    years 89 & 89.  None since   PONV (postoperative nausea and vomiting)    nausea no vomiting   Rectocele    Transfusion history    child x2, many yrs ago after  childbirthhemorrhage   Umbilical hernia 2012   Past Surgical History:  Procedure Laterality Date   blood vessel tumor removal  1980   from chin   CHOLECYSTECTOMY     COLONOSCOPY WITH PROPOFOL  N/A 09/02/2015   Procedure: COLONOSCOPY WITH PROPOFOL ;  Surgeon: Gladis MARLA Louder, MD;  Location: [facility];  Service: Endoscopy;  Laterality: N/A;   GANGLION CYST EXCISION     right   HEMORRHOID SURGERY     HERNIA REPAIR     repair was aimed at the [facility]   HIATAL HERNIA REPAIR     and nissen fundoplication   LAPAROSCOPIC ESOPHAGOGASTRIC FUNDOPLASTY     LAPAROSCOPIC INCISIONAL / UMBILICAL / VENTRAL HERNIA REPAIR     umbilical hernia   LOOP RECORDER INSERTION N/A 09/13/2023   Procedure: LOOP RECORDER INSERTION;  Surgeon: Leverne Charlies Helling, PA-C;  Location: [facility];  Service: Cardiovascular;  Laterality: N/A;   OOPHORECTOMY     left   RIGHT OOPHORECTOMY     '05-laparaoscopic   SHOULDER SURGERY Right  THYROIDECTOMY N/A 07/06/2016   Procedure: TOTAL THYROIDECTOMY;  Surgeon: Krystal Russell, MD;  Location: [facility];  Service: General;  Laterality: N/A;   TONSILLECTOMY     TOTAL THYROIDECTOMY  07/06/2016   TRANSESOPHAGEAL ECHOCARDIOGRAM (CATH LAB) N/A 09/13/2023   Procedure: TRANSESOPHAGEAL ECHOCARDIOGRAM;  Surgeon: Mona Vinie BROCKS, MD;  Location: [facility];  Service: Cardiovascular;  Laterality: N/A;   Patient Active Problem List   Diagnosis Date Noted   Hypertension    CVA (cerebral vascular accident) (HCC) 09/10/2023   Stroke (cerebrum) (HCC) 09/10/2023   Pseudophakia, both eyes 10/13/2021   Dermatochalasis of both lower eyelids 10/13/2021   Moderate nonproliferative diabetic retinopathy of left eye (HCC) 10/10/2020   Moderate nonproliferative diabetic retinopathy of right eye (HCC) 08/29/2019   Early stage nonexudative age-related macular degeneration of both eyes 08/29/2019   Right posterior capsular opacification 08/29/2019   Left thyroid  nodule 07/06/2016   Benign  neoplasm of skin of upper limb, including shoulder 04/19/2012   Ventral hernia, recurrent 01/26/2011    ONSET DATE: 09/10/23  REFERRING DIAG:  I63.9 (ICD-10-CM) - Acute CVA (cerebrovascular accident) (HCC)    THERAPY DIAG:  Unsteadiness on feet  Other abnormalities of gait and mobility  Muscle weakness (generalized)  Rationale for Evaluation and Treatment: Rehabilitation  SUBJECTIVE:                                                                                                                                                                                             SUBJECTIVE STATEMENT: Today having some dizziness when I stand up, I feel like rocking or imbalance.  Move slowly.  No falls.  Missed last visit because thought I had the flu.     Pt accompanied by: self  PERTINENT HISTORY: 83 y.o. year old female with stroke in May 2025.  Left eye central retinal artery occlusion and multiple small scattered ischemic infarcts.  Cardioembolic source.  Presented to her ophthalmologist with painless loss of vision to the left eye.  Loop recorder placed 09/13/23.  TEE negative.  Vascular risk factors: HTN, HLD. Incidental 8 mm meningioma overlying the anterior right frontal lobe  PAIN:  Are you having pain? Yes: NPRS scale: no/10 Pain location: posterior right knee Pain description: ache Aggravating factors: standing/walking Relieving factors: cortisone shot recently  PRECAUTIONS: None  RED FLAGS: None   WEIGHT BEARING RESTRICTIONS: No  FALLS: Has patient fallen in last 6 months? Yes. Number of falls 2 falls involving going down stairs; 1 recent fall on to grass 11/17/23  LIVING ENVIRONMENT: Lives with: lives with their son Lives in: House/apartment ground floor set-up Stairs: 6 steps  to enter Has following equipment at home: Single point cane and Walker - 4 wheeled  PLOF: Independent with household mobility with device and Independent with household mobility without  device  PATIENT GOALS: improve balance  OBJECTIVE:    Pt verbalizes understanding of updates to HEP from last visit. Rates dizziness 3-4/10.  (OT prior to PT session measured BP and last measure was 124/89   TODAY'S TREATMENT: 02/09/2024 Activity Comments  NuStep, Level 2, 4 extremities x 6 minutes   Standing: Forward/back walking in parallel bars 5 reps Sidestepping  BUE>1 UE support  BUE support, cues for foot placement  SLS activities: Alt step taps to 4 step Foot propped on 4 step, 3 x 10 BUE>1 UE support  2 x 10 reps No UE support standing, UE support while transitioning  Standing Romberg EO 30 seconds, EC 6 sec, 15 sec, 6 sec Attempted on foam EO 30 sec, EC, <3 seconds   Corner balance feet together  EO head turns/ head nods  2 x 5 reps  Wall bumps x 10 EO, then EC  Light UE support with EC    *Pt does report dizziness has nearly gone at end of session   HOME EXERCISE PROGRAM Access Code: [email or web address] URL: [email or web address] Date: 12/20/2023> updates 01/19/2024 and 02/09/2024 Prepared by: [facility] - Outpatient  Rehab - [facility]  Program Notes perform standing exercises at corner or counter for safety  Exercises - Seated Long Arc Quad  - 1 x daily - 7 x weekly - 3 sets - 10 reps (*try red band at home) - Staggered Sit-to-Stand  - 1 x daily - 7 x weekly - 3 sets - 5 reps - Heel Toe Raises with Counter Support  - 1 x daily - 7 x weekly - 3 sets - 10 reps - Standing Hip Abduction with Counter Support  - 1 x daily - 7 x weekly - 3 sets - 10 reps - Staggered Stance Forward Backward Weight Shift with Counter Support  - 1 x daily - 7 x weekly - 3 sets - 10 reps - Seated Horizontal Smooth Pursuit  - 1 x daily - 7 x weekly - 2 sets - 30 sec hold - Seated Vertical Smooth Pursuit  - 1 x daily - 7 x weekly - 2 sets - 30 sec hold - Seated VOR Cancellation  - 1 x daily - 7 x weekly - 2 sets - 30 sec hold - Seated Horizontal Saccades  - 1 x daily - 7 x  weekly - 2 sets - 30 sec hold - Standing Balance in Corner with Eyes Closed  - 1 x daily - 5 x weekly - 2 sets - 30 sec hold - Seated Hip Abduction with Resistance  - 2-3 x weekly - 3 sets - 10 reps (*green band) - Marching with Resistance  - 2-3 x weekly - 3 sets - 10 reps (performing without resistance) - Corner Balance Feet Together With Eyes Open  - 1 x daily - 7 x weekly - 2 sets - 5 reps HEAD TURNS AND NODS     PATIENT EDUCATION: Education details: Update to HEP-see above for corner balance Person educated: Patient Education method: Explanation, Demonstration, Tactile cues, Verbal cues, and Handouts Education comprehension: verbalized understanding and returned demonstration   --------------------------------------------  VESTIBULAR ASSESSMENT 12/01/23:  GENERAL OBSERVATION: n/a   SYMPTOM BEHAVIOR:  Subjective history: Increased dizziness after acute new CVA on 11/26/23  Non-Vestibular symptoms: nausea/vomiting  Type of dizziness: Imbalance (Disequilibrium),  Unsteady with head/body turns, and It feels like I'm moving.  Frequency: Comes and goes, maybe 3-5x in the day  Duration: Can last a couple of hours  Aggravating factors: Induced by motion: turning body quickly and turning head quickly  Relieving factors: medication and rest  Progression of symptoms: worse  OCULOMOTOR EXAM:  Ocular Alignment: normal  Ocular ROM: No Limitations  Spontaneous Nystagmus: absent  Gaze-Induced Nystagmus: absent  Smooth Pursuits: intact  Saccades: slow  FRENZEL - FIXATION SUPRESSED: Did not assess  VESTIBULAR - OCULAR REFLEX:   Slow VOR: Positive Left  VOR Cancellation: Normal  Head-Impulse Test: HIT Right: negative HIT Left: positive  Dynamic Visual Acuity: TBA   POSITIONAL TESTING: did not assess   MOTION SENSITIVITY: did not assess   OTHOSTATICS: not done  DIAGNOSTIC FINDINGS:   MR BRAIN WO CONTRAST Result Date: 11/26/2023 CLINICAL DATA:  Neuro deficit, acute, stroke  suspected EXAM: MRI HEAD WITHOUT CONTRAST TECHNIQUE: Multiplanar, multiecho pulse sequences of the brain and surrounding structures were obtained without intravenous contrast. COMPARISON:  MRI head Sep 10, 2023 FINDINGS: Brain: Punctate acute infarct in the right cerebellum (series five, image sixty one). No acute hemorrhage, hydrocephalus, extra-axial collection or mass lesion. Multiple small remote lacunar infarcts in the frontal and parietal white matter bilaterally. Patchy T2/FLAIR hyperintensities, compatible with chronic microvascular ischemic change. Vascular: Normal flow voids. Skull and upper cervical spine: Normal marrow signal. Sinuses/Orbits: Negative. IMPRESSION: Punctate acute infarct in the right cerebellum. Electronically Signed   By: Gilmore GORMAN Molt M.D.   On: 11/26/2023 03:24  09/10/23 MRI multiple small acute infarcts scattered within bilateral cerebral hemispheres, likely embolic in nature, 8 mm meningioma overlying anterior right frontal lobe, background atrophy and chronic small vessel ischemic disease - MRA no LVO or significant stenosis - Carotid Doppler unremarkable - Bilateral lower extremity ultrasound negative - 2D echo EF 60 to 65% - Loop recorder placed - TEE EF 60 to 65%, negative for PFO - LDL 30 - A1c 6.6  FROM INITIAL EVALUATION 11/17/23 (UNLESS OTHERWISE NOTED)  COGNITION: Overall cognitive status: Within functional limits for tasks assessed   SENSATION: WFL  COORDINATION: WFL Slight dysmetria finger to nose LUE  EDEMA:  None noted  MUSCLE TONE: NT  MUSCLE LENGTH: WFL  DTRs:  NT  POSTURE: scoliosis w/ pelvic obliquity   LOWER EXTREMITY ROM:    WFL   LOWER EXTREMITY MMT:    MMT Right Eval Left Eval R/L 12/01/23  Hip flexion 5 4 3+/3+  Hip extension   3+/3+  Hip abduction 4 3- 3/3-  Hip adduction 4 4   Hip internal rotation     Hip external rotation     Knee flexion 5 4 5/4  Knee extension 5 4 5/4  Ankle dorsiflexion 5 5   Ankle  plantarflexion     Ankle inversion     Ankle eversion     (Blank rows = not tested)  BED MOBILITY:  Not tested reports indep  TRANSFERS: Sit to stand: Complete Independence  Assistive device utilized: None     Chair to chair: Modified independence  Assistive device utilized: UE support       RAMP:  Not tested  CURB:  Findings: SBA-CGA  STAIRS: Findings: Comments: Supervision w/ BHR GAIT: Findings: Gait Characteristics: trendelenburg and Comments: left Modified indep level surfaces, CGA uneven  FUNCTIONAL TESTS:   11/17/23 5 times sit to stand: 18.78 sec Timed up and go (TUG): 19 sec w/ cane 10 meter walk test: 20 sec w/  cane = 1.64 ft/sec Berg Balance Test:42/56  M-CTSIB  Condition 1: Firm Surface, EO 30 Sec, Moderate Sway  Condition 2: Firm Surface, EC 6 Sec, Severe Sway  Condition 3: Foam Surface, EO  Sec,  Sway  Condition 4: Foam Surface, EC  Sec,  Sway    12/01/23 5 times sit to stand: 27.47 sec (R hand on knee, uses legs against bed) 10 meter walk test: 17.03 sec with  Lars Balance Test: 27/56 TUG: Deferred today due to pt L hip fatigued and starting to hurt after functional testing MCTSIB: Deferred today due to pt L hip pain                                                                                                                                   GOALS: Goals reviewed with patient? Yes  SHORT TERM GOALS: Target date: 12/08/2023>>UPDATED STG 02/11/2024    Patient will be independent in HEP to improve functional outcomes Baseline: per report 02/09/2024 Goal status: MET, 02/09/2024  2.  Demo improved postural stability per mild-moderate sway x 30 sec condition 2 M-CTSIB to improve safety with ADL Baseline: 10 sec with mod-severe sway, 6-15 sec severe sway Goal status: NOT MET 02/09/2024  3.  Demo improved BLE strength and reduce risk for falls per time 15 sec 5xSTS test Baseline: 18 sec; 13 sec Goal status: MET    LONG TERM GOALS: Target date:  01/12/2024>>UPDATED TARGET 03/10/2024    Demo improved left hip abduction strength to 3+/5 for improved single limb support and to minimize gait dysfunction Baseline: 3-/5, Trendelenberg Goal status: IN PROGRESS, 01/19/2024  2.  Modified independent stair ambulation for improved safety with entering/exiting home Baseline: supervision BHR Goal status: IN PROGRESS, 01/19/2024  3.  Reduce risk for falls per score 48/56 Berg Balance Test Baseline 11/17/23: 42/56 12/01/23: 21/56; 01/19/2024 27/56 Goal status: IN PROGRESS, 01/19/2024  4.  Reduce risk for falls per time 14 sec TUG test Baseline: 19 sec w/ cane; 21 sec with 4WW 12/20/23 Goal status: IN PROGRESS 12/20/23  5.  Modified independent with uneven surfaces and curb negotiation to improve safety in community Baseline: CGA Goal status: IN PROGRESS 01/19/2024  6.  Pt will improve 5x sit<>stand to less than or equal to 13 sec to demonstrate improved functional strength and transfer efficiency.  Baseline:  18 sec 01/19/2024  Goals status:  IN PROGRESS      ASSESSMENT:  CLINICAL IMPRESSION: Pt presents today with reports of dizziness (unsteadiness that she rates at baseline).   Skilled PT session focused on flexibility/aerobic warm up then dynamic and static balance exercises. Pt has met STG 1 and not met STG 2, able to hold 6-15 seconds EC for MCTSIB Condition 2; she recovers by reaching out with hands for support.  Worked on hip stability, SLS and continued balance work as well as hip strategies in corner.  She reports by end of session that she  feels almost no dizziness.  She will continue to benefit from skilled PT towards goals for improved functional mobility and decreased fall risk.     OBJECTIVE IMPAIRMENTS: Abnormal gait, decreased activity tolerance, decreased balance, difficulty walking, decreased strength, and postural dysfunction.   ACTIVITY LIMITATIONS: carrying, lifting, standing, stairs, transfers, reach over head, and locomotion  level  PARTICIPATION LIMITATIONS: meal prep, cleaning, laundry, interpersonal relationship, driving, shopping, and community activity  PERSONAL FACTORS: Age, Time since onset of injury/illness/exacerbation, and 1-2 comorbidities: PMH are also affecting patient's functional outcome.   REHAB POTENTIAL: Good  CLINICAL DECISION MAKING: Evolving/moderate complexity  EVALUATION COMPLEXITY: Moderate  PLAN:  PT FREQUENCY: 1-2x/week  PT DURATION: 8 weeks per recert 01/19/2024  PLANNED INTERVENTIONS: [id number]- PT Re-evaluation, 97750- Physical Performance Testing, 97110-Therapeutic exercises, 97530- Therapeutic activity, 97112- Neuromuscular re-education, 97535- Self Care, [id number]- Manual therapy, [phone]- Gait training, [phone]- Orthotic Initial, [phone]- Canalith repositioning, [id number]- Aquatic Therapy, [phone]- Electrical stimulation (unattended), [phone] (1-2 muscles), [id number] (3+ muscles)- Dry Needling, Patient/Family education, Balance training, Stair training, Taping, Joint mobilization, Spinal mobilization, Vestibular training, Cryotherapy, and Moist heat  PLAN FOR NEXT SESSION: *Needs resubmit for [facility] Medicare; *make sure she's scheduled out to end of POC date*Review oculomotor HEP, vestibular training, strengthening, balance; progress HEP as appropriate   Greig Anon, PT 02/09/24 5:03 PM Phone: [phone] Fax: [phone]  [facility] at [facility] [address] [place], [place] [address] Phone # [phone] Fax # [phone]

## 2024-02-11 DIAGNOSIS — M25561 Pain in right knee: Secondary | ICD-10-CM | POA: Diagnosis not present

## 2024-02-16 ENCOUNTER — Ambulatory Visit

## 2024-02-16 DIAGNOSIS — R2681 Unsteadiness on feet: Secondary | ICD-10-CM

## 2024-02-16 DIAGNOSIS — R41842 Visuospatial deficit: Secondary | ICD-10-CM

## 2024-02-16 DIAGNOSIS — R2689 Other abnormalities of gait and mobility: Secondary | ICD-10-CM

## 2024-02-16 DIAGNOSIS — R41840 Attention and concentration deficit: Secondary | ICD-10-CM

## 2024-02-16 DIAGNOSIS — R262 Difficulty in walking, not elsewhere classified: Secondary | ICD-10-CM

## 2024-02-16 DIAGNOSIS — R278 Other lack of coordination: Secondary | ICD-10-CM | POA: Diagnosis not present

## 2024-02-16 DIAGNOSIS — R42 Dizziness and giddiness: Secondary | ICD-10-CM

## 2024-02-16 DIAGNOSIS — M6281 Muscle weakness (generalized): Secondary | ICD-10-CM

## 2024-02-16 NOTE — Therapy (Signed)
 OUTPATIENT PHYSICAL THERAPY TREATMENT NOTE   Patient Name: Annette Gross MRN: [id number] DOB:Jan 07, 1941, 83 y.o., female Today's Date: 02/16/2024   PCP: Alys Schuyler HERO, PA REFERRING PROVIDER: Gayland Lauraine PARAS, NP     END OF SESSION:  PT End of Session - 02/16/24 1451     Visit Number 9    Number of Visits 21    Date for Recertification  03/10/24    Authorization Type [employer] Medicare-    Authorization Time Period Auth#: [id number] , approved 6 PT visits from 01/19/2024 to 02/16/2024.    Authorization - Visit Number 3    Authorization - Number of Visits 6    Progress Note Due on Visit 17    PT Start Time 1450    PT Stop Time 1530    PT Time Calculation (min) 40 min    Equipment Utilized During Treatment Gait belt    Activity Tolerance Patient tolerated treatment well    Behavior During Therapy WFL for tasks assessed/performed               Past Medical History:  Diagnosis Date   Anemia    many yrs ago   Anxiety    Asthma    no recent issues   Cancer (HCC)    s/p thyroidectomy 2018   Complication of anesthesia    Cystitis, interstitial    Degenerative disc disease, lumbar    Degenerative joint disease    spinal stenosis Chronic low back pain   Depression    Diabetes mellitus    diet control.   Diverticulitis 2005   Diverticulosis 2005   Factor V deficiency (HCC)    Fibromyalgia    Fracture of right foot 1995   GERD (gastroesophageal reflux disease)    History of hiatal hernia    Hyperlipidemia    Hypertension    off med x 5 yrs.   IBS (irritable bowel syndrome)    Insomnia    Iron deficiency    Liver hemangioma 1999   MRI   Mononucleosis    Morton's neuroma    Left foot   Nuclear sclerotic cataract of left eye 08/29/2019   Peripheral neuropathy    Pneumonia    years 89 & 89.  None since   PONV (postoperative nausea and vomiting)    nausea no vomiting   Rectocele    Transfusion history    child x2, many yrs ago after  childbirthhemorrhage   Umbilical hernia 2012   Past Surgical History:  Procedure Laterality Date   blood vessel tumor removal  1980   from chin   CHOLECYSTECTOMY     COLONOSCOPY WITH PROPOFOL  N/A 09/02/2015   Procedure: COLONOSCOPY WITH PROPOFOL ;  Surgeon: Gladis MARLA Louder, MD;  Location: [facility];  Service: Endoscopy;  Laterality: N/A;   GANGLION CYST EXCISION     right   HEMORRHOID SURGERY     HERNIA REPAIR     repair was aimed at the [facility]   HIATAL HERNIA REPAIR     and nissen fundoplication   LAPAROSCOPIC ESOPHAGOGASTRIC FUNDOPLASTY     LAPAROSCOPIC INCISIONAL / UMBILICAL / VENTRAL HERNIA REPAIR     umbilical hernia   LOOP RECORDER INSERTION N/A 09/13/2023   Procedure: LOOP RECORDER INSERTION;  Surgeon: Leverne Charlies Helling, PA-C;  Location: [facility];  Service: Cardiovascular;  Laterality: N/A;   OOPHORECTOMY     left   RIGHT OOPHORECTOMY     '05-laparaoscopic   SHOULDER SURGERY Right  THYROIDECTOMY N/A 07/06/2016   Procedure: TOTAL THYROIDECTOMY;  Surgeon: Krystal Russell, MD;  Location: [facility] OR;  Service: General;  Laterality: N/A;   TONSILLECTOMY     TOTAL THYROIDECTOMY  07/06/2016   TRANSESOPHAGEAL ECHOCARDIOGRAM (CATH LAB) N/A 09/13/2023   Procedure: TRANSESOPHAGEAL ECHOCARDIOGRAM;  Surgeon: Mona Vinie BROCKS, MD;  Location: [facility];  Service: Cardiovascular;  Laterality: N/A;   Patient Active Problem List   Diagnosis Date Noted   Hypertension    CVA (cerebral vascular accident) (HCC) 09/10/2023   Stroke (cerebrum) (HCC) 09/10/2023   Pseudophakia, both eyes 10/13/2021   Dermatochalasis of both lower eyelids 10/13/2021   Moderate nonproliferative diabetic retinopathy of left eye (HCC) 10/10/2020   Moderate nonproliferative diabetic retinopathy of right eye (HCC) 08/29/2019   Early stage nonexudative age-related macular degeneration of both eyes 08/29/2019   Right posterior capsular opacification 08/29/2019   Left thyroid  nodule 07/06/2016   Benign  neoplasm of skin of upper limb, including shoulder 04/19/2012   Ventral hernia, recurrent 01/26/2011    ONSET DATE: 09/10/23  REFERRING DIAG:  I63.9 (ICD-10-CM) - Acute CVA (cerebrovascular accident) (HCC)    THERAPY DIAG:  Unsteadiness on feet  Other abnormalities of gait and mobility  Muscle weakness (generalized)  Difficulty in walking, not elsewhere classified  Dizziness and giddiness  Rationale for Evaluation and Treatment: Rehabilitation  SUBJECTIVE:                                                                                                                                                                                             SUBJECTIVE STATEMENT: Had Baker's cyst on right knee aspirated yesterday and feels much better. Not feeling dizzy, but out of balance   Pt accompanied by: self  PERTINENT HISTORY: 83 y.o. year old female with stroke in May 2025.  Left eye central retinal artery occlusion and multiple small scattered ischemic infarcts.  Cardioembolic source.  Presented to her ophthalmologist with painless loss of vision to the left eye.  Loop recorder placed 09/13/23.  TEE negative.  Vascular risk factors: HTN, HLD. Incidental 8 mm meningioma overlying the anterior right frontal lobe  PAIN:  Are you having pain? Yes: NPRS scale: no/10 Pain location: posterior right knee Pain description: ache Aggravating factors: standing/walking Relieving factors: cortisone shot recently  PRECAUTIONS: None  RED FLAGS: None   WEIGHT BEARING RESTRICTIONS: No  FALLS: Has patient fallen in last 6 months? Yes. Number of falls 2 falls involving going down stairs; 1 recent fall on to grass 11/17/23  LIVING ENVIRONMENT: Lives with: lives with their son Lives in: House/apartment ground floor set-up Stairs: 6 steps to enter  Has following equipment at home: Single point cane and Walker - 4 wheeled  PLOF: Independent with household mobility with device and Independent with  household mobility without device  PATIENT GOALS: improve balance  OBJECTIVE:   TODAY'S TREATMENT: 02/16/24 Activity Comments  Oculomotor HEP activity review Occasional cues for maintaining head stationary   balance -dynamic balance: march (UE support needed), retrowalk (UE support) -static multisensory: min-mod sway w/ eyes closed/compliant surfaces -stepping to obstacles with single limb support and static holds/crossing midline -sidestep over 4 obstacles BUE support -postural perturbations x 60 sec                 Pt verbalizes understanding of updates to HEP from last visit. Rates dizziness 3-4/10.  (OT prior to PT session measured BP and last measure was 124/89   TODAY'S TREATMENT: 02/09/2024 Activity Comments  NuStep, Level 2, 4 extremities x 6 minutes   Standing: Forward/back walking in parallel bars 5 reps Sidestepping  BUE>1 UE support  BUE support, cues for foot placement  SLS activities: Alt step taps to 4 step Foot propped on 4 step, 3 x 10 BUE>1 UE support  2 x 10 reps No UE support standing, UE support while transitioning  Standing Romberg EO 30 seconds, EC 6 sec, 15 sec, 6 sec Attempted on foam EO 30 sec, EC, <3 seconds   Corner balance feet together  EO head turns/ head nods  2 x 5 reps  Wall bumps x 10 EO, then EC  Light UE support with EC    *Pt does report dizziness has nearly gone at end of session   HOME EXERCISE PROGRAM Access Code: [email or web address] URL: [email or web address] Date: 12/20/2023> updates 01/19/2024 and 02/09/2024 Prepared by: [facility] - Outpatient  Rehab - [facility]  Program Notes perform standing exercises at corner or counter for safety  Exercises - Seated Long Arc Quad  - 1 x daily - 7 x weekly - 3 sets - 10 reps (*try red band at home) - Staggered Sit-to-Stand  - 1 x daily - 7 x weekly - 3 sets - 5 reps - Heel Toe Raises with Counter Support  - 1 x daily - 7 x weekly - 3 sets - 10 reps - Standing Hip  Abduction with Counter Support  - 1 x daily - 7 x weekly - 3 sets - 10 reps - Staggered Stance Forward Backward Weight Shift with Counter Support  - 1 x daily - 7 x weekly - 3 sets - 10 reps - Seated Horizontal Smooth Pursuit  - 1 x daily - 7 x weekly - 2 sets - 30 sec hold - Seated Vertical Smooth Pursuit  - 1 x daily - 7 x weekly - 2 sets - 30 sec hold - Seated VOR Cancellation  - 1 x daily - 7 x weekly - 2 sets - 30 sec hold - Seated Horizontal Saccades  - 1 x daily - 7 x weekly - 2 sets - 30 sec hold - Standing Balance in Corner with Eyes Closed  - 1 x daily - 5 x weekly - 2 sets - 30 sec hold - Seated Hip Abduction with Resistance  - 2-3 x weekly - 3 sets - 10 reps (*green band) - Marching with Resistance  - 2-3 x weekly - 3 sets - 10 reps (performing without resistance) - Corner Balance Feet Together With Eyes Open  - 1 x daily - 7 x weekly - 2 sets - 5 reps HEAD TURNS AND NODS  PATIENT EDUCATION: Education details: Update to HEP-see above for corner balance Person educated: Patient Education method: Explanation, Demonstration, Tactile cues, Verbal cues, and Handouts Education comprehension: verbalized understanding and returned demonstration   --------------------------------------------  VESTIBULAR ASSESSMENT 12/01/23:  GENERAL OBSERVATION: n/a   SYMPTOM BEHAVIOR:  Subjective history: Increased dizziness after acute new CVA on 11/26/23  Non-Vestibular symptoms: nausea/vomiting  Type of dizziness: Imbalance (Disequilibrium), Unsteady with head/body turns, and It feels like I'm moving.  Frequency: Comes and goes, maybe 3-5x in the day  Duration: Can last a couple of hours  Aggravating factors: Induced by motion: turning body quickly and turning head quickly  Relieving factors: medication and rest  Progression of symptoms: worse  OCULOMOTOR EXAM:  Ocular Alignment: normal  Ocular ROM: No Limitations  Spontaneous Nystagmus: absent  Gaze-Induced Nystagmus:  absent  Smooth Pursuits: intact  Saccades: slow  FRENZEL - FIXATION SUPRESSED: Did not assess  VESTIBULAR - OCULAR REFLEX:   Slow VOR: Positive Left  VOR Cancellation: Normal  Head-Impulse Test: HIT Right: negative HIT Left: positive  Dynamic Visual Acuity: TBA   POSITIONAL TESTING: did not assess   MOTION SENSITIVITY: did not assess   OTHOSTATICS: not done  DIAGNOSTIC FINDINGS:   MR BRAIN WO CONTRAST Result Date: 11/26/2023 CLINICAL DATA:  Neuro deficit, acute, stroke suspected EXAM: MRI HEAD WITHOUT CONTRAST TECHNIQUE: Multiplanar, multiecho pulse sequences of the brain and surrounding structures were obtained without intravenous contrast. COMPARISON:  MRI head Sep 10, 2023 FINDINGS: Brain: Punctate acute infarct in the right cerebellum (series five, image sixty one). No acute hemorrhage, hydrocephalus, extra-axial collection or mass lesion. Multiple small remote lacunar infarcts in the frontal and parietal white matter bilaterally. Patchy T2/FLAIR hyperintensities, compatible with chronic microvascular ischemic change. Vascular: Normal flow voids. Skull and upper cervical spine: Normal marrow signal. Sinuses/Orbits: Negative. IMPRESSION: Punctate acute infarct in the right cerebellum. Electronically Signed   By: Gilmore GORMAN Molt M.D.   On: 11/26/2023 03:24  09/10/23 MRI multiple small acute infarcts scattered within bilateral cerebral hemispheres, likely embolic in nature, 8 mm meningioma overlying anterior right frontal lobe, background atrophy and chronic small vessel ischemic disease - MRA no LVO or significant stenosis - Carotid Doppler unremarkable - Bilateral lower extremity ultrasound negative - 2D echo EF 60 to 65% - Loop recorder placed - TEE EF 60 to 65%, negative for PFO - LDL 30 - A1c 6.6  FROM INITIAL EVALUATION 11/17/23 (UNLESS OTHERWISE NOTED)  COGNITION: Overall cognitive status: Within functional limits for tasks  assessed   SENSATION: WFL  COORDINATION: WFL Slight dysmetria finger to nose LUE  EDEMA:  None noted  MUSCLE TONE: NT  MUSCLE LENGTH: WFL  DTRs:  NT  POSTURE: scoliosis w/ pelvic obliquity   LOWER EXTREMITY ROM:    WFL   LOWER EXTREMITY MMT:    MMT Right Eval Left Eval R/L 12/01/23  Hip flexion 5 4 3+/3+  Hip extension   3+/3+  Hip abduction 4 3- 3/3-  Hip adduction 4 4   Hip internal rotation     Hip external rotation     Knee flexion 5 4 5/4  Knee extension 5 4 5/4  Ankle dorsiflexion 5 5   Ankle plantarflexion     Ankle inversion     Ankle eversion     (Blank rows = not tested)  BED MOBILITY:  Not tested reports indep  TRANSFERS: Sit to stand: Complete Independence  Assistive device utilized: None     Chair to chair: Modified independence  Assistive device utilized:  UE support       RAMP:  Not tested  CURB:  Findings: SBA-CGA  STAIRS: Findings: Comments: Supervision w/ BHR GAIT: Findings: Gait Characteristics: trendelenburg and Comments: left Modified indep level surfaces, CGA uneven  FUNCTIONAL TESTS:   11/17/23 5 times sit to stand: 18.78 sec Timed up and go (TUG): 19 sec w/ cane 10 meter walk test: 20 sec w/ cane = 1.64 ft/sec Berg Balance Test:42/56  M-CTSIB  Condition 1: Firm Surface, EO 30 Sec, Moderate Sway  Condition 2: Firm Surface, EC 6 Sec, Severe Sway  Condition 3: Foam Surface, EO  Sec,  Sway  Condition 4: Foam Surface, EC  Sec,  Sway    12/01/23 5 times sit to stand: 27.47 sec (R hand on knee, uses legs against bed) 10 meter walk test: 17.03 sec with  Lars Balance Test: 27/56 TUG: Deferred today due to pt L hip fatigued and starting to hurt after functional testing MCTSIB: Deferred today due to pt L hip pain                                                                                                                                   GOALS: Goals reviewed with patient? Yes  SHORT TERM GOALS: Target date:  12/08/2023>>UPDATED STG 02/11/2024    Patient will be independent in HEP to improve functional outcomes Baseline: per report 02/09/2024 Goal status: MET, 02/09/2024  2.  Demo improved postural stability per mild-moderate sway x 30 sec condition 2 M-CTSIB to improve safety with ADL Baseline: 10 sec with mod-severe sway, 6-15 sec severe sway Goal status: NOT MET 02/09/2024  3.  Demo improved BLE strength and reduce risk for falls per time 15 sec 5xSTS test Baseline: 18 sec; 13 sec Goal status: MET    LONG TERM GOALS: Target date: 01/12/2024>>UPDATED TARGET 03/10/2024    Demo improved left hip abduction strength to 3+/5 for improved single limb support and to minimize gait dysfunction Baseline: 3-/5, Trendelenberg Goal status: IN PROGRESS, 01/19/2024  2.  Modified independent stair ambulation for improved safety with entering/exiting home Baseline: supervision BHR Goal status: IN PROGRESS, 01/19/2024  3.  Reduce risk for falls per score 48/56 Berg Balance Test Baseline 11/17/23: 42/56 12/01/23: 21/56; 01/19/2024 27/56 Goal status: IN PROGRESS, 01/19/2024  4.  Reduce risk for falls per time 14 sec TUG test Baseline: 19 sec w/ cane; 21 sec with 4WW 12/20/23 Goal status: IN PROGRESS 12/20/23  5.  Modified independent with uneven surfaces and curb negotiation to improve safety in community Baseline: CGA Goal status: IN PROGRESS 01/19/2024  6.  Pt will improve 5x sit<>stand to less than or equal to 13 sec to demonstrate improved functional strength and transfer efficiency.  Baseline:  18 sec 01/19/2024  Goals status:  IN PROGRESS      ASSESSMENT:  CLINICAL IMPRESSION: Review of oculomotor tx techniques for improved control/tracking with some difficulty in maintaining head stationary for saccades  and smooth pursuits. Request to continue activities for balance improvement with focus on dynamic standing balance to promote single limb support and facilitation of postural correction in response  to LOB requiring UE support. Static multisensory balance with mild-mod sway with eyes closed/compliant conditions but with good ability to monitor and self-correct without UE support OBJECTIVE IMPAIRMENTS: Abnormal gait, decreased activity tolerance, decreased balance, difficulty walking, decreased strength, and postural dysfunction.   ACTIVITY LIMITATIONS: carrying, lifting, standing, stairs, transfers, reach over head, and locomotion level  PARTICIPATION LIMITATIONS: meal prep, cleaning, laundry, interpersonal relationship, driving, shopping, and community activity  PERSONAL FACTORS: Age, Time since onset of injury/illness/exacerbation, and 1-2 comorbidities: PMH are also affecting patient's functional outcome.   REHAB POTENTIAL: Good  CLINICAL DECISION MAKING: Evolving/moderate complexity  EVALUATION COMPLEXITY: Moderate  PLAN:  PT FREQUENCY: 1-2x/week  PT DURATION: 8 weeks per recert 01/19/2024  PLANNED INTERVENTIONS: [id number]- PT Re-evaluation, 97750- Physical Performance Testing, 97110-Therapeutic exercises, 97530- Therapeutic activity, 97112- Neuromuscular re-education, 97535- Self Care, [id number]- Manual therapy, [phone]- Gait training, [phone]- Orthotic Initial, [phone]- Canalith repositioning, [id number]- Aquatic Therapy, [phone]- Electrical stimulation (unattended), [phone] (1-2 muscles), [id number] (3+ muscles)- Dry Needling, Patient/Family education, Balance training, Stair training, Taping, Joint mobilization, Spinal mobilization, Vestibular training, Cryotherapy, and Moist heat  PLAN FOR NEXT SESSION: *Needs resubmit for [facility] Medicare (Submitted 10/15) *make sure she's scheduled out to end of POC date*Review oculomotor HEP, vestibular training, strengthening, balance; progress HEP as appropriate   3:33 PM, 02/16/24 M. Kelly Giovana Faciane, PT, DPT Physical Therapist- [place] Office Number: [phone]

## 2024-02-16 NOTE — Therapy (Signed)
 OUTPATIENT OCCUPATIONAL THERAPY NEURO  Treatment Note  Patient Name: Annette Gross MRN: [id number] DOB:08/03/1940, 83 y.o., female Today's Date: 02/16/2024  PCP: Dr. Sharie REFERRING PROVIDER: Gayland Lauraine PARAS, NP  END OF SESSION:  OT End of Session - 02/16/24 1627     Visit Number 6    Number of Visits 13    Date for Recertification  03/03/24    Authorization Type [employer] Medicare 2025    OT Start Time 1531    OT Stop Time 1614    OT Time Calculation (min) 43 min    Equipment Utilized During Treatment cones, testing materials    Activity Tolerance Patient tolerated treatment well    Behavior During Therapy WFL for tasks assessed/performed              Past Medical History:  Diagnosis Date   Anemia    many yrs ago   Anxiety    Asthma    no recent issues   Cancer (HCC)    s/p thyroidectomy 2018   Complication of anesthesia    Cystitis, interstitial    Degenerative disc disease, lumbar    Degenerative joint disease    spinal stenosis Chronic low back pain   Depression    Diabetes mellitus    diet control.   Diverticulitis 2005   Diverticulosis 2005   Factor V deficiency (HCC)    Fibromyalgia    Fracture of right foot 1995   GERD (gastroesophageal reflux disease)    History of hiatal hernia    Hyperlipidemia    Hypertension    off med x 5 yrs.   IBS (irritable bowel syndrome)    Insomnia    Iron deficiency    Liver hemangioma 1999   MRI   Mononucleosis    Morton's neuroma    Left foot   Nuclear sclerotic cataract of left eye 08/29/2019   Peripheral neuropathy    Pneumonia    years 89 & 89.  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   CHOLECYSTECTOMY     COLONOSCOPY WITH PROPOFOL  N/A 09/02/2015    Procedure: COLONOSCOPY WITH PROPOFOL ;  Surgeon: Gladis MARLA Louder, MD;  Location: [facility];  Service: Endoscopy;  Laterality: N/A;   GANGLION CYST EXCISION     right   HEMORRHOID SURGERY     HERNIA REPAIR     repair was aimed at the [facility]   HIATAL HERNIA REPAIR     and nissen fundoplication   LAPAROSCOPIC ESOPHAGOGASTRIC FUNDOPLASTY     LAPAROSCOPIC INCISIONAL / UMBILICAL / VENTRAL HERNIA REPAIR     umbilical hernia   LOOP RECORDER INSERTION N/A 09/13/2023   Procedure: LOOP RECORDER INSERTION;  Surgeon: Leverne Charlies Helling, PA-C;  Location: [facility] INVAS[facility] CV [facility];  Service: Cardiovascular;  Laterality: N/A;   OOPHORECTOMY     left   RIGHT OOPHORECTOMY     '05-laparaoscopic   SHOULDER SURGERY Right    THYROIDECTOMY N/A 07/06/2016   Procedure: TOTAL THYROIDECTOMY;  Surgeon: Krystal Russell, MD;  Location: [facility] OR;  Service: General;  Laterality: N/A;   TONSILLECTOMY     TOTAL THYROIDECTOMY  07/06/2016   TRANSESOPHAGEAL ECHOCARDIOGRAM (CATH LAB) N/A 09/13/2023   Procedure: TRANSESOPHAGEAL  ECHOCARDIOGRAM;  Surgeon: Mona Vinie BROCKS, MD;  Location: [facility] INVASIVE CV LAB;  Service: Cardiovascular;  Laterality: N/A;   Patient Active Problem List   Diagnosis Date Noted   Hypertension    CVA (cerebral vascular accident) (HCC) 09/10/2023   Stroke (cerebrum) (HCC) 09/10/2023   Pseudophakia, both eyes 10/13/2021   Dermatochalasis of both lower eyelids 10/13/2021   Moderate nonproliferative diabetic retinopathy of left eye (HCC) 10/10/2020   Moderate nonproliferative diabetic retinopathy of right eye (HCC) 08/29/2019   Early stage nonexudative age-related macular degeneration of both eyes 08/29/2019   Right posterior capsular opacification 08/29/2019   Left thyroid  nodule 07/06/2016   Benign neoplasm of skin of upper limb, including shoulder 04/19/2012   Ventral hernia, recurrent 01/26/2011    ONSET DATE: 09/10/23  REFERRING DIAG: I63.9 (ICD-10-CM) - Acute CVA (cerebrovascular accident) (HCC)  THERAPY  DIAG:  Other lack of coordination  Attention and concentration deficit  Muscle weakness (generalized)  Visuospatial deficit  Rationale for Evaluation and Treatment: Rehabilitation  SUBJECTIVE:   SUBJECTIVE STATEMENT: Pt reports completing putty sometimes. I do the visual tracking every day.   Pt accompanied by: self (dropped off by friend)  PERTINENT HISTORY: Presented to the ER 09/10/2023 after waking with impaired vision to her left eye, she could only see a crescent shaped area at the top of her field of vision. A right lower quadrantanopsia was also noted. She went through her ophthalmologist was told she had a central retinal artery occlusion and was sent to the ER. She was admitted for painless loss of vision to the central field of the left eye. On MRI found to have multiple embolic appearing ischemic strokes.  PMH of DM II, anxiety, cancer, DJD, DM, fibromyalgia, HTN, HLD, IBS  PRECAUTIONS: Fall  WEIGHT BEARING RESTRICTIONS: No  PAIN:  Are you having pain? Yes: NPRS scale: 2 Pain location: R knee  - had a shot in it last week Pain description: aching  FALLS: Has patient fallen in last 6 months? Yes. Number of falls 2 - one fall down the basement stairs and 1 this morning getting down the steps when walking to the car  LIVING ENVIRONMENT: Lives with: lives with their family (son and his partner) Lives in: House/apartment Stairs: Yes: Internal: there are stairs to the basement, but does not need to go down those steps; and External: 6 steps; bilateral but cannot reach both Has following equipment at home: Single point cane, Walker - 4 wheeled, and shower chair  PLOF: Independent, Independent with basic ADLs, and Leisure: enjoys drawing and painting  PATIENT GOALS: to be aware of vision, handwriting  OBJECTIVE:  Note: Objective measures were completed at Evaluation unless otherwise noted.  HAND DOMINANCE: Ambidextrous - writes with R hand, eats with L  hand  ADLs: Transfers/ambulation related to ADLs: Uses Rollator in the home to transport items, SPC in the community Eating: reports decreased depth perception when pouring drink into cup, reports having to look at food when scooping Grooming: Mod I UB Dressing: Mod I - does have difficulty with bra but is able to complete without assist LB Dressing: Mod I Toileting: Mod I Bathing: Mod I  Tub Shower transfers: Mod I Equipment: Shower seat without back  IADLs: Light housekeeping: helps with the dishes, emptying dishwasher, sweeping, folding and puttying away laundry Meal Prep: simple meal prep with sandwiches and cereal, is not primary cook now that she lives with family of 5 Community mobility: not cleared to resume driving s/p CVA Medication management:  plans to get a pill box to organize all meds Handwriting: 100% legible and 17.5 sec for PPT #1 ([place]  MOBILITY STATUS: Hx of falls, uses Rollator in home occasionally and SPC in community  POSTURE COMMENTS:  Scoliosis with pelvic obliquity  ACTIVITY TOLERANCE: Activity tolerance: diminished  FUNCTIONAL OUTCOME MEASURES: 9 hole peg test: 9 Hole Peg test: Right: 34.72 sec; Left: 30.94 sec (pt asking about next step after filling peg board - ?difficulty with 2 step commands)  PSFS: TBD  UPPER EXTREMITY ROM:    Active ROM Right eval Left eval  Shoulder flexion 142 112 ( reports she is awaiting surgery)  Shoulder abduction    Shoulder adduction    Shoulder extension    Shoulder internal rotation [facility]  Shoulder external rotation [facility]  Elbow flexion [facility] WFL  Elbow extension [facility] WFL  Wrist flexion [facility] WFL  Wrist extension [facility] WFL  Wrist ulnar deviation    Wrist radial deviation    Wrist pronation    Wrist supination    (Blank rows = not tested)  UPPER EXTREMITY MMT:     MMT Right eval Left eval  Shoulder flexion 4- 3+(reports awaiting surgery)  Shoulder abduction    Shoulder adduction     Shoulder extension    Shoulder internal rotation    Shoulder external rotation    Middle trapezius    Lower trapezius    Elbow flexion    Elbow extension    Wrist flexion    Wrist extension    Wrist ulnar deviation    Wrist radial deviation    Wrist pronation    Wrist supination    (Blank rows = not tested)  COORDINATION: Finger Nose Finger test: [facility] 9 Hole Peg test: Right: 34.72 sec; Left: 30.94 sec (pt asking about next step after filling peg board - ?difficulty with 2 step commands) Box and Blocks:  Right 41 blocks, Left 43 blocks (hit barrier x3 with R, x2 with L)  12/16/23 9 hole peg test: Right: 31.56 sec; Left: 29.25   02/16/24: 02/16/24: Right: 32.79 sec; Left 29.03  SENSATION: WFL   COGNITION: Overall cognitive status: Pt reports thinking skills are not quite the same (reports difficulty with crossword puzzles than before) and reports increased time to think through something.  VISION: Subjective report: vision is improving Baseline vision: Wears glasses for reading only Visual history: cataracts (s/p removal)  VISION ASSESSMENT: Impaired Ocular ROM: WFL Gaze preference/alignment: WDL Tracking/Visual pursuits: Able to track stimulus in all quads without difficulty Saccades: additional eye shifts occurred during testing Visual Fields: Right visual field deficits Depth perception: pt reports decreased depth perception, did not recognize stimulus until nearly at midline on L side Some decreased recognition of simulus on L vision field with confrontation testing  Patient has difficulty with following activities due to following visual impairments: embroidering, pouring drink into cup, handwriting  TREATMENT DATE:  02/16/24 Re-assessed 9HPT, handwriting simple sentence, and Bell Cancellation Test. Updated goals, see below. Informed pt of  decreased FM coordination in R hand, pt in agreement with continuing OT to continue to address R FM coordination tasks. Pt urged to complete FM activities from tx at home every day. When asked if there were any tasks she used to do or had more difficulty doing at this time, pt reported I used to sew, I stopped doing that about a year ago.  Completed visual scanning activity, instructing pt to find cones hidden throughout gym. Pt found 8/9, 4/5 on L side and 4/4 on R side. Some unsafe tendencies noted including pt not turning rollator with her, pt educated in importance of keeping rollator close to self to reduce risk of falling.   02/09/24 Pt reported dizziness: 156/89 BP and 87 HR. PT notified. Monitored throughout tx.  Educated pt in energy conservation, see Pt instructions for detailed handout to promote safety and quality of life in the home.   Educated pt in obtaining activities to address visual tracking as well as FM coordination and alternating attention online (ie: spot the difference, word searches).    Completed visual tracking test, located 7/8 of designated letters on first test, then 28/31 numbers on second test (test originally had 32 but one was cut off d/t the copier).   Educated pt in final coordination activity for carryover with handwriting tasks, instructed to find 4-5 small items in light resistance yellow theraputty. Pt instructed in proper care and storage of putty. Measured BP once more, 124/89 and 89 HR. PT notified of vitals.  01/19/24 Bell cancellation: pt locating 31 of 35 bells in 3:13.  Pt demonstrating good linear scanning, however still omitting 4.Pt omitting bells primarily in middle of picture.  Upon review, pt stating that she had a hard time seeing the missing ones and reports that when she is reading that she will occasionally miss the middle. Trail making: completed Trail A in 52.72 seconds with no errors.  Completed Trail B in 2:14 with no errors.  Pt picking  up pen intermittently but no errors or doubling back.   Handwriting: Pt writing name and simple sentence with 100% legibility.  Pt reports that it is worse than it used to be and feels that it is her coordination that is impacting it. Pegs: Right: 30.0 sec and Left: 29.5 sec.  Discussed norms with pt expressing desire to continue to work with coordination. Coordination: provided pt with coordination HEP (see pt instructions) and providing demonstration of each and modifications to card tasks to increase cognitive challenge and alternating attention.      PATIENT EDUCATION: Education details: Energy conservation Person educated: Patient Education method: Explanation, Verbal cues, and Handouts Education comprehension: verbalized understanding and needs further education  HOME EXERCISE PROGRAM: 11/17/23 - depth perception activities (see pt instructions)   GOALS: Goals reviewed with patient? Yes    SHORT TERM GOALS: Target date: 02/25/24  Pt will be independent with vision HEP with use of handouts. Baseline: new to OPOT Goal status: in progress  2.  Pt will be independent with coordination HEP with use of handouts. Baseline: new to OPOT Goal status:  in progress  4.  Pt will improve visual scanning/use compensatory strategies to perform cancellation task with 92% accuracy.  Baseline: 31/35 on Bell cancellation task 02/16/24: 32/35 on Bell cancellation Goal status:  REVISED  LONG TERM GOALS: Target date: 03/03/24  1.Pt will demonstrate improved fine motor  coordination for ADLs as evidenced by decreasing 9 hole peg test score for RUE by 3 secs from 01/19/24 data. Baseline: Right: 34.72 sec; Left: 30.94 sec  12/16/23: Right: 31.56 sec 01/19/24: Right: 30.0 sec, Left: 29.5 sec 02/16/24: Right: 32.79 sec; Left 29.03 Goal status: REVISED  2.  Pt will navigate a moderately busy environment, completing dual task activity and/or following multi-step commands with 90%  accuracy. Baseline: difficulty with 2 step commands Goal status: on going  3.  Pt will demonstrate improved coordination to allow for improved speed with handwriting to completing simple sentence in <15 seconds without compromising 100% legibility. Baseline: 17.5 sec 01/19/24: 18.25 sec with 100% legibility 02/16/24: 19.38 with 100% legibility Goal status: on going  4.  Pt will verbalize and/or demonstrate understanding of energy conservation strategies to increase engagement in and safety with ADLs and IADLs.  Baseline: new to OPOT Goal status: on going   ASSESSMENT:  CLINICAL IMPRESSION: Patient is a 83 y.o. female who was seen today for occupational therapy treatment session with focus on vision and coordination s/p CVA. Pt appears to demonstrate improved visual scanning, but tendency to favor R side of field of vision is still noted. Noted decrease in coordination in R hand per 9HPT. Pt informed of this and is in agreement to continue skilled OT at this time to improve coordination in R hand to carry over with ADL and leisure tasks.   PERFORMANCE DEFICITS: in functional skills including ADLs, IADLs, coordination, pain, Fine motor control, Gross motor control, balance, body mechanics, endurance, decreased knowledge of precautions, decreased knowledge of use of DME, vision, and UE functional use, cognitive skills including memory and safety awareness, and psychosocial skills including coping strategies, environmental adaptation, and routines and behaviors.     PLAN:  OT FREQUENCY: 1-2x/week  OT DURATION: 6 weeks  PLANNED INTERVENTIONS: 97168 OT Re-evaluation, 97535 self care/ADL training, 02889 therapeutic exercise, 97530 therapeutic activity, 97112 neuromuscular re-education, 97140 manual therapy, balance training, functional mobility training, visual/perceptual remediation/compensation, psychosocial skills training, energy conservation, coping strategies training, patient/family  education, and DME and/or AE instructions  RECOMMENDED OTHER SERVICES: NA  CONSULTED AND AGREED WITH PLAN OF CARE: Patient  PLAN FOR NEXT SESSION: R FM Coordination tasks; 2-3 step directions for recall, alternating attention tasks      Rocky Dutch, OTR/L 02/16/2024, 4:28 PM  [facility] [address], [address] [place], [place] [address] Phone # [phone] Fax # [phone]

## 2024-02-24 ENCOUNTER — Ambulatory Visit

## 2024-02-24 ENCOUNTER — Ambulatory Visit: Admitting: Occupational Therapy

## 2024-02-24 DIAGNOSIS — M6281 Muscle weakness (generalized): Secondary | ICD-10-CM

## 2024-02-24 DIAGNOSIS — R2681 Unsteadiness on feet: Secondary | ICD-10-CM

## 2024-02-24 DIAGNOSIS — R2689 Other abnormalities of gait and mobility: Secondary | ICD-10-CM

## 2024-02-24 DIAGNOSIS — R262 Difficulty in walking, not elsewhere classified: Secondary | ICD-10-CM

## 2024-02-24 DIAGNOSIS — R42 Dizziness and giddiness: Secondary | ICD-10-CM

## 2024-02-24 DIAGNOSIS — R278 Other lack of coordination: Secondary | ICD-10-CM | POA: Diagnosis not present

## 2024-02-24 DIAGNOSIS — R4701 Aphasia: Secondary | ICD-10-CM

## 2024-02-24 NOTE — Patient Instructions (Signed)
   Small talk topics General:  weather, sports, music, movies, traffic If adult: Married? Kids? Grandkids?  If at a certain place or event: Ask about previous experience with that place or event E.g. ask about previous bridge groups if you meet someone at a bridge group

## 2024-02-24 NOTE — Therapy (Signed)
 OUTPATIENT SPEECH LANGUAGE PATHOLOGY TREATMENT/discharge   Patient Name: Annette Gross MRN: [id number] DOB:25-Apr-1941, 83 y.o., female Today's Date: 02/24/2024  PCP: Annette Bitters, PA REFERRING PROVIDER: Gayland Lauraine PARAS, NP  END OF SESSION:   End of Session - 02/24/24 1709     Visit Number 5    Number of Visits 6    Date for Recertification  03/22/24   due to pt not able to schedule ST until 02/24/24   SLP Start Time 1618    SLP Stop Time  1700    SLP Time Calculation (min) 42 min    Activity Tolerance Patient tolerated treatment well            Past Medical History:  Diagnosis Date   Anemia    many yrs ago   Anxiety    Asthma    no recent issues   Cancer (HCC)    s/p thyroidectomy 2018   Complication of anesthesia    Cystitis, interstitial    Degenerative disc disease, lumbar    Degenerative joint disease    spinal stenosis Chronic low back pain   Depression    Diabetes mellitus    diet control.   Diverticulitis 2005   Diverticulosis 2005   Factor V deficiency (HCC)    Fibromyalgia    Fracture of right foot 1995   GERD (gastroesophageal reflux disease)    History of hiatal hernia    Hyperlipidemia    Hypertension    off med x 5 yrs.   IBS (irritable bowel syndrome)    Insomnia    Iron deficiency    Liver hemangioma 1999   MRI   Mononucleosis    Morton's neuroma    Left foot   Nuclear sclerotic cataract of left eye 08/29/2019   Peripheral neuropathy    Pneumonia    years 89 & 89.  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   CHOLECYSTECTOMY     COLONOSCOPY WITH PROPOFOL  N/A 09/02/2015   Procedure: COLONOSCOPY WITH PROPOFOL ;  Surgeon: Annette MARLA Louder, MD;  Location: [facility];  Service:  Endoscopy;  Laterality: N/A;   GANGLION CYST EXCISION     right   HEMORRHOID SURGERY     HERNIA REPAIR     repair was aimed at the [facility]   HIATAL HERNIA REPAIR     and nissen fundoplication   LAPAROSCOPIC ESOPHAGOGASTRIC FUNDOPLASTY     LAPAROSCOPIC INCISIONAL / UMBILICAL / VENTRAL HERNIA REPAIR     umbilical hernia   LOOP RECORDER INSERTION N/A 09/13/2023   Procedure: LOOP RECORDER INSERTION;  Surgeon: Annette Charlies Helling, PA-C;  Location: [facility];  Service: Cardiovascular;  Laterality: N/A;   OOPHORECTOMY     left   RIGHT OOPHORECTOMY     '05-laparaoscopic   SHOULDER SURGERY Right    THYROIDECTOMY N/A 07/06/2016   Procedure: TOTAL THYROIDECTOMY;  Surgeon: Annette Russell, MD;  Location: [facility] OR;  Service: General;  Laterality: N/A;   TONSILLECTOMY     TOTAL THYROIDECTOMY  07/06/2016   TRANSESOPHAGEAL ECHOCARDIOGRAM (CATH LAB) N/A 09/13/2023   Procedure: TRANSESOPHAGEAL ECHOCARDIOGRAM;  Surgeon: Annette Vinie BROCKS, MD;  Location: [facility];  Service: Cardiovascular;  Laterality:  N/A;   Patient Active Problem List   Diagnosis Date Noted   Hypertension    CVA (cerebral vascular accident) (HCC) 09/10/2023   Stroke (cerebrum) (HCC) 09/10/2023   Pseudophakia, both eyes 10/13/2021   Dermatochalasis of both lower eyelids 10/13/2021   Moderate nonproliferative diabetic retinopathy of left eye (HCC) 10/10/2020   Moderate nonproliferative diabetic retinopathy of right eye (HCC) 08/29/2019   Early stage nonexudative age-related macular degeneration of both eyes 08/29/2019   Right posterior capsular opacification 08/29/2019   Left thyroid  nodule 07/06/2016   Benign neoplasm of skin of upper limb, including shoulder 04/19/2012   Ventral hernia, recurrent 01/26/2011   SPEECH THERAPY DISCHARGE SUMMARY  Visits from Start of Care: 4  Current functional level related to goals / functional outcomes: Pt has made excellent success with word finding. Speech at this time is within  normal limits.    Remaining deficits: No deficits remain.    Education / Equipment: See therapy session notes.   Patient agrees to discharge. Patient goals were partially met. Patient is being discharged due to pt's language WNL.        ONSET DATE: 09/10/23  REFERRING DIAG:  I63.9 (ICD-10-CM) - Acute CVA (cerebrovascular accident) (HCC)    THERAPY DIAG:  Aphasia  Rationale for Evaluation and Treatment: Rehabilitation  SUBJECTIVE:   SUBJECTIVE STATEMENT: That writing something down five times has really helped.   Pt accompanied by: self  PERTINENT HISTORY:  MRI HEAD WO CONTRAST IMPRESSION: Punctate acute infarct in the right cerebellum.   From [facility] Born HISTORY OF PRESENT ILLNESS: Today 10/14/23 Here with Annette Gross, son's partner. They all live together. Continues to have large vision loss to the left eye, but can see crescent shaped light at the top that is getting larger. Saw ophthalmology.  She feels like right eye is fine. No other symptoms. BP was high today 162/81, then good on recheck 120/70 olmesartan. Continues on home Repatha. Goes to [place] for primary care. Mentions occasionally will have tingling to left side of face every few days, no longer than 30 minutes, only at V2 area radiates behind ear, no headache or dental pain. Mentions prior to stroke had some memory concerns remembering names or hard time finding her words. Would like to get occupational and speech therapy arranged. She doesn't drive since CVA. Other than driving, back to normal activities.   PAIN:  Are you having pain? No  FALLS: Has patient fallen in last 6 months?  See PT evaluation for details   PATIENT GOALS: Concerned about word finding incidents x2-3/week  OBJECTIVE:  Note: Objective measures were completed at Evaluation unless otherwise noted.  DIAGNOSTIC FINDINGS:  CT 09/10/23 IMPRESSION: No acute CT finding. Age related volume loss. Chronic small-vessel ischemic changes of the  white matter and basal ganglia.  MRI 09/10/23 IMPRESSION: 1. Multiple small acute infarcts scattered within the bilateral cerebral hemispheres, 10-15 in number and measuring up to 8 mm. Given involvement of multiple vascular territories, findings suggest sequelae of an embolic process. 2. 8 mm meningioma overlying the anterior right frontal lobe. The mass slightly indents the underlying brain parenchyma without underlying parenchymal edema. 3. Background parenchymal atrophy and chronic small vessel ischemic disease.   SLE - 09/12/23 Clinical Impression  Pt reports concerns with processing PTA but attributes this to age. She states she is typically independent but has assistance PRN from her son and his family. She scored 23/30 on the SLUMS (a 27 or above is considered WFL) characterized by deficits related to  memory (retrieval > stroage), selective attention, and executive functioning. She accurately recalled 3/5 novel words after a delay but states this is likely the most signficant change from her baseline. Pt had difficulty with a clock drawing task, repeating numbers and incorrectly placing the hands. Question effect of L vision deficits. Recommend ongoing SLP f/u on an OP basis, will sign off acutelty.   PATIENT REPORTED OUTCOME MEASURES (PROM): Communication Effectiveness Survey: provided to pt 12/16/23 but not pt forgot to return on 12/22/23. She will bring back 01/17/24.                                                                                                                            TREATMENT DATE:  Semantic Feature Analysis = SFA, Verb Network Strengthening Treatment=VNeST  02/24/24: Did not bring back initial PROM. Because of this pt does not need to complete follow up PROM today. LTG for PROM will be deferred. SLP reviewed SFA and VNeST with pt; She req'd mod A to complete. From this SLP does not think pt has been completing at home. SLP told pt she could complete these  whenever she liked at home. She asked SLP how to make small talk as she finds it difficult, prior to the CVA. SLP briefly discussed topics for small talk. (See pt instructions) Today pt's speech/language appeared WNL. She agreed with d/c today.  01/19/24: Pt again forgot PROM. SLP told pt to try to bring it next time, or PROM goal will be deferred. Pt did not exhibit anomia in today's session. I just kept (talking about) all the words I could think of associated with salsa. Pt used this strategy when trying to think of salsa which was successful. Today SLP guided pt through another SFA task which pt has not necessarily been completing at home. She did so with rare min A for procedure (write the word in for each answer, say the answer after its written). SLP introduced VNeST with pt today as well, and she completed with usual max A. SLP told pt to complete 2-3 of these per week. Pt and SLP agreed pt should be seen once every other week x2-3 more visits due to progress. Pt states it is a little harder to find the word I want when anomia occurs. SLP to focus on ensuring she knows how to perform SFA and VNeST however this is dependent upon pt's use of these at home. Secondly, pt to cont focusing on her use of compensations for anomia.  12/22/23: Pt did not return PROM - told her to bring next session. Best compensation at this time is I just wait a bit and the word comes.  Viviane reports anomia frequency continuing as 2-3 times a week. SLP reviewed previous session with pt and told her she should not perform SFA with proper nouns but SLP DID encourage pt to write/spell word x5, then put the word into three different sentences if she cannot use a SFA with  it. Today SLP and pt completed another SFA with SLP  (mod I necessary) reiterating to only use SFA with nouns and not proper nouns. Pt to see SLP in 4 weeks and likely either d/c or reduce frequency to once every two weeks.  12/16/23: PROM provided to  return next session. Pt looks relatively unchanged with speech and language after rt cerebellar CVA on 11/26/23. Will recert today. SLP introduced SFA to pt today and collaborated with her to complete one with platter as the target word. Pt req'd initial cues for writing the target word in the sentences but was independent after that. In conversation with SLP during the session today neither anomia nor a verbal expression deficit were noted. Pt will continue with this process (SFA) if she experiences anomia with a noun. Homework provided.  11/17/23: n/a  PATIENT EDUCATION: Education details: See treatment date for more details Person educated: Patient Education method: Explanation Education comprehension: verbalized understanding   GOALS: Goals reviewed with patient? Yes  SHORT TERM GOALS:  = LONG TERM GOALS: Target date:   03/22/24 (due to pt unable to schedule ST until 02/24/24)  Pt will perform SFA with mod I in 2 sessions Baseline: 12/22/23 Goal status: partially met  2.  Pt will perform VNEST with mod I in 2 sessions Baseline: 01/19/24 Goal status: partially met  3.  Pt will report using one new compensatory strategy for anomia between two sessions Baseline: 12/22/23 Goal status: met  4.  Pt will score better with PROM than initial administration Baseline:  Goal status: deferred as pt never returned initial PROM   ASSESSMENT:  CLINICAL IMPRESSION: Patient is a 83 y.o. F who was seen today for treatment of aphasia following CVA Sep 10, 2023. She has premorbid dx of ADD (since 1999-2000 pt stated). Pt mentions prior to CVA had some s/sx aphasia and memory disorder. Today pt's speech/language appeared WNL Pt agreed to d/c today.    OBJECTIVE IMPAIRMENTS: include expressive language. These impairments are limiting patient from effectively communicating at home and in community. Factors affecting potential to achieve goals and functional outcome are premorbid dx of ADD.SABRA  Patient will benefit from skilled SLP services to address above impairments and improve overall function.  REHAB POTENTIAL: Excellent  PLAN:  discharge today   PLANNED INTERVENTIONS: Language facilitation, Internal/external aids, Multimodal communication approach, SLP instruction and feedback, Compensatory strategies, Patient/family education, and [id number] Treatment of speech (30 or 45 min)     Annamay Laymon, CCC-SLP 02/24/2024, 5:10 PM

## 2024-02-24 NOTE — Therapy (Signed)
 OUTPATIENT PHYSICAL THERAPY TREATMENT NOTE   Patient Name: Annette Gross MRN: [id number] DOB:1940-08-24, 83 y.o., female Today's Date: 02/24/2024   PCP: Alys Schuyler HERO, PA REFERRING PROVIDER: Gayland Lauraine PARAS, NP     END OF SESSION:  PT End of Session - 02/24/24 1536     Visit Number 10    Number of Visits 21    Date for Recertification  03/10/24    Authorization Type [employer] Medicare-    Authorization Time Period Auth#: [id number] , approved 6 PT visits from 01/19/2024 to 02/16/2024.    Authorization - Visit Number 4    Authorization - Number of Visits 6    Progress Note Due on Visit 17    PT Start Time 1535    PT Stop Time 1615    PT Time Calculation (min) 40 min    Equipment Utilized During Treatment Gait belt    Activity Tolerance Patient tolerated treatment well    Behavior During Therapy WFL for tasks assessed/performed               Past Medical History:  Diagnosis Date   Anemia    many yrs ago   Anxiety    Asthma    no recent issues   Cancer (HCC)    s/p thyroidectomy 2018   Complication of anesthesia    Cystitis, interstitial    Degenerative disc disease, lumbar    Degenerative joint disease    spinal stenosis Chronic low back pain   Depression    Diabetes mellitus    diet control.   Diverticulitis 2005   Diverticulosis 2005   Factor V deficiency (HCC)    Fibromyalgia    Fracture of right foot 1995   GERD (gastroesophageal reflux disease)    History of hiatal hernia    Hyperlipidemia    Hypertension    off med x 5 yrs.   IBS (irritable bowel syndrome)    Insomnia    Iron deficiency    Liver hemangioma 1999   MRI   Mononucleosis    Morton's neuroma    Left foot   Nuclear sclerotic cataract of left eye 08/29/2019   Peripheral neuropathy    Pneumonia    years 89 & 89.  None since   PONV (postoperative nausea and vomiting)    nausea no vomiting   Rectocele    Transfusion history    child x2, many yrs ago after  childbirthhemorrhage   Umbilical hernia 2012   Past Surgical History:  Procedure Laterality Date   blood vessel tumor removal  1980   from chin   CHOLECYSTECTOMY     COLONOSCOPY WITH PROPOFOL  N/A 09/02/2015   Procedure: COLONOSCOPY WITH PROPOFOL ;  Surgeon: Gladis MARLA Louder, MD;  Location: [facility];  Service: Endoscopy;  Laterality: N/A;   GANGLION CYST EXCISION     right   HEMORRHOID SURGERY     HERNIA REPAIR     repair was aimed at the [facility]   HIATAL HERNIA REPAIR     and nissen fundoplication   LAPAROSCOPIC ESOPHAGOGASTRIC FUNDOPLASTY     LAPAROSCOPIC INCISIONAL / UMBILICAL / VENTRAL HERNIA REPAIR     umbilical hernia   LOOP RECORDER INSERTION N/A 09/13/2023   Procedure: LOOP RECORDER INSERTION;  Surgeon: Leverne Charlies Helling, PA-C;  Location: [facility];  Service: Cardiovascular;  Laterality: N/A;   OOPHORECTOMY     left   RIGHT OOPHORECTOMY     '05-laparaoscopic   SHOULDER SURGERY Right  THYROIDECTOMY N/A 07/06/2016   Procedure: TOTAL THYROIDECTOMY;  Surgeon: Krystal Russell, MD;  Location: [facility] OR;  Service: General;  Laterality: N/A;   TONSILLECTOMY     TOTAL THYROIDECTOMY  07/06/2016   TRANSESOPHAGEAL ECHOCARDIOGRAM (CATH LAB) N/A 09/13/2023   Procedure: TRANSESOPHAGEAL ECHOCARDIOGRAM;  Surgeon: Mona Vinie BROCKS, MD;  Location: [facility];  Service: Cardiovascular;  Laterality: N/A;   Patient Active Problem List   Diagnosis Date Noted   Hypertension    CVA (cerebral vascular accident) (HCC) 09/10/2023   Stroke (cerebrum) (HCC) 09/10/2023   Pseudophakia, both eyes 10/13/2021   Dermatochalasis of both lower eyelids 10/13/2021   Moderate nonproliferative diabetic retinopathy of left eye (HCC) 10/10/2020   Moderate nonproliferative diabetic retinopathy of right eye (HCC) 08/29/2019   Early stage nonexudative age-related macular degeneration of both eyes 08/29/2019   Right posterior capsular opacification 08/29/2019   Left thyroid  nodule 07/06/2016   Benign  neoplasm of skin of upper limb, including shoulder 04/19/2012   Ventral hernia, recurrent 01/26/2011    ONSET DATE: 09/10/23  REFERRING DIAG:  I63.9 (ICD-10-CM) - Acute CVA (cerebrovascular accident) (HCC)    THERAPY DIAG:  Muscle weakness (generalized)  Unsteadiness on feet  Other abnormalities of gait and mobility  Difficulty in walking, not elsewhere classified  Dizziness and giddiness  Rationale for Evaluation and Treatment: Rehabilitation  SUBJECTIVE:                                                                                                                                                                                             SUBJECTIVE STATEMENT: Knee is feeling much better, will be getting custom shoes on Monday   Pt accompanied by: self  PERTINENT HISTORY: 83 y.o. year old female with stroke in May 2025.  Left eye central retinal artery occlusion and multiple small scattered ischemic infarcts.  Cardioembolic source.  Presented to her ophthalmologist with painless loss of vision to the left eye.  Loop recorder placed 09/13/23.  TEE negative.  Vascular risk factors: HTN, HLD. Incidental 8 mm meningioma overlying the anterior right frontal lobe  PAIN:  Are you having pain? Yes: NPRS scale: no/10 Pain location: posterior right knee Pain description: ache Aggravating factors: standing/walking Relieving factors: cortisone shot recently  PRECAUTIONS: None  RED FLAGS: None   WEIGHT BEARING RESTRICTIONS: No  FALLS: Has patient fallen in last 6 months? Yes. Number of falls 2 falls involving going down stairs; 1 recent fall on to grass 11/17/23  LIVING ENVIRONMENT: Lives with: lives with their son Lives in: House/apartment ground floor set-up Stairs: 6 steps to enter Has following equipment at home: Single point  cane and Walker - 4 wheeled  PLOF: Independent with household mobility with device and Independent with household mobility without device  PATIENT  GOALS: improve balance  OBJECTIVE:   TODAY'S TREATMENT: 02/24/24 Activity Comments  Seated LE AROM and manual muscle testing 4-/5 left tib ant  LAQ 3x12 3#  Sidestepping 2x60 sec 3#  Alt stair taps 2x60 sec 3#  Forward step-up 1x10 Lateral step-up 1x10 BUE support, 6  Static multisensory balance Light tactile cues for eyes closed conditions        TODAY'S TREATMENT: 02/16/24 Activity Comments  Oculomotor HEP activity review Occasional cues for maintaining head stationary   balance -dynamic balance: march (UE support needed), retrowalk (UE support) -static multisensory: min-mod sway w/ eyes closed/compliant surfaces -stepping to obstacles with single limb support and static holds/crossing midline -sidestep over 4 obstacles BUE support -postural perturbations x 60 sec                 Pt verbalizes understanding of updates to HEP from last visit. Rates dizziness 3-4/10.  (OT prior to PT session measured BP and last measure was 124/89   TODAY'S TREATMENT: 02/09/2024 Activity Comments  NuStep, Level 2, 4 extremities x 6 minutes   Standing: Forward/back walking in parallel bars 5 reps Sidestepping  BUE>1 UE support  BUE support, cues for foot placement  SLS activities: Alt step taps to 4 step Foot propped on 4 step, 3 x 10 BUE>1 UE support  2 x 10 reps No UE support standing, UE support while transitioning  Standing Romberg EO 30 seconds, EC 6 sec, 15 sec, 6 sec Attempted on foam EO 30 sec, EC, <3 seconds   Corner balance feet together  EO head turns/ head nods  2 x 5 reps  Wall bumps x 10 EO, then EC  Light UE support with EC    *Pt does report dizziness has nearly gone at end of session   HOME EXERCISE PROGRAM Access Code: [email or web address] URL: [email or web address] Date: 12/20/2023> updates 01/19/2024 and 02/09/2024 Prepared by: [facility] - Outpatient  Rehab - [facility]  Program Notes perform standing exercises at corner or counter for  safety  Exercises - Seated Long Arc Quad  - 1 x daily - 7 x weekly - 3 sets - 10 reps (*try red band at home) - Staggered Sit-to-Stand  - 1 x daily - 7 x weekly - 3 sets - 5 reps - Heel Toe Raises with Counter Support  - 1 x daily - 7 x weekly - 3 sets - 10 reps - Standing Hip Abduction with Counter Support  - 1 x daily - 7 x weekly - 3 sets - 10 reps - Staggered Stance Forward Backward Weight Shift with Counter Support  - 1 x daily - 7 x weekly - 3 sets - 10 reps - Seated Horizontal Smooth Pursuit  - 1 x daily - 7 x weekly - 2 sets - 30 sec hold - Seated Vertical Smooth Pursuit  - 1 x daily - 7 x weekly - 2 sets - 30 sec hold - Seated VOR Cancellation  - 1 x daily - 7 x weekly - 2 sets - 30 sec hold - Seated Horizontal Saccades  - 1 x daily - 7 x weekly - 2 sets - 30 sec hold - Standing Balance in Corner with Eyes Closed  - 1 x daily - 5 x weekly - 2 sets - 30 sec hold - Seated Hip Abduction with Resistance  - 2-3 x  weekly - 3 sets - 10 reps (*green band) - Marching with Resistance  - 2-3 x weekly - 3 sets - 10 reps (performing without resistance) - Corner Balance Feet Together With Eyes Open  - 1 x daily - 7 x weekly - 2 sets - 5 reps HEAD TURNS AND NODS     PATIENT EDUCATION: Education details: Update to HEP-see above for corner balance Person educated: Patient Education method: Explanation, Demonstration, Tactile cues, Verbal cues, and Handouts Education comprehension: verbalized understanding and returned demonstration   --------------------------------------------  VESTIBULAR ASSESSMENT 12/01/23:  GENERAL OBSERVATION: n/a   SYMPTOM BEHAVIOR:  Subjective history: Increased dizziness after acute new CVA on 11/26/23  Non-Vestibular symptoms: nausea/vomiting  Type of dizziness: Imbalance (Disequilibrium), Unsteady with head/body turns, and It feels like I'm moving.  Frequency: Comes and goes, maybe 3-5x in the day  Duration: Can last a couple of hours  Aggravating factors:  Induced by motion: turning body quickly and turning head quickly  Relieving factors: medication and rest  Progression of symptoms: worse  OCULOMOTOR EXAM:  Ocular Alignment: normal  Ocular ROM: No Limitations  Spontaneous Nystagmus: absent  Gaze-Induced Nystagmus: absent  Smooth Pursuits: intact  Saccades: slow  FRENZEL - FIXATION SUPRESSED: Did not assess  VESTIBULAR - OCULAR REFLEX:   Slow VOR: Positive Left  VOR Cancellation: Normal  Head-Impulse Test: HIT Right: negative HIT Left: positive  Dynamic Visual Acuity: TBA   POSITIONAL TESTING: did not assess   MOTION SENSITIVITY: did not assess   OTHOSTATICS: not done  DIAGNOSTIC FINDINGS:   MR BRAIN WO CONTRAST Result Date: 11/26/2023 CLINICAL DATA:  Neuro deficit, acute, stroke suspected EXAM: MRI HEAD WITHOUT CONTRAST TECHNIQUE: Multiplanar, multiecho pulse sequences of the brain and surrounding structures were obtained without intravenous contrast. COMPARISON:  MRI head Sep 10, 2023 FINDINGS: Brain: Punctate acute infarct in the right cerebellum (series five, image sixty one). No acute hemorrhage, hydrocephalus, extra-axial collection or mass lesion. Multiple small remote lacunar infarcts in the frontal and parietal white matter bilaterally. Patchy T2/FLAIR hyperintensities, compatible with chronic microvascular ischemic change. Vascular: Normal flow voids. Skull and upper cervical spine: Normal marrow signal. Sinuses/Orbits: Negative. IMPRESSION: Punctate acute infarct in the right cerebellum. Electronically Signed   By: Gilmore GORMAN Molt M.D.   On: 11/26/2023 03:24  09/10/23 MRI multiple small acute infarcts scattered within bilateral cerebral hemispheres, likely embolic in nature, 8 mm meningioma overlying anterior right frontal lobe, background atrophy and chronic small vessel ischemic disease - MRA no LVO or significant stenosis - Carotid Doppler unremarkable - Bilateral lower extremity ultrasound negative - 2D echo EF 60 to  65% - Loop recorder placed - TEE EF 60 to 65%, negative for PFO - LDL 30 - A1c 6.6  FROM INITIAL EVALUATION 11/17/23 (UNLESS OTHERWISE NOTED)  COGNITION: Overall cognitive status: Within functional limits for tasks assessed   SENSATION: WFL  COORDINATION: WFL Slight dysmetria finger to nose LUE  EDEMA:  None noted  MUSCLE TONE: NT  MUSCLE LENGTH: WFL  DTRs:  NT  POSTURE: scoliosis w/ pelvic obliquity   LOWER EXTREMITY ROM:    WFL   LOWER EXTREMITY MMT:    MMT Right Eval Left Eval R/L 12/01/23  Hip flexion 5 4 3+/3+  Hip extension   3+/3+  Hip abduction 4 3- 3/3-  Hip adduction 4 4   Hip internal rotation     Hip external rotation     Knee flexion 5 4 5/4  Knee extension 5 4 5/4  Ankle dorsiflexion 5 5  Ankle plantarflexion     Ankle inversion     Ankle eversion     (Blank rows = not tested)  BED MOBILITY:  Not tested reports indep  TRANSFERS: Sit to stand: Complete Independence  Assistive device utilized: None     Chair to chair: Modified independence  Assistive device utilized: UE support       RAMP:  Not tested  CURB:  Findings: SBA-CGA  STAIRS: Findings: Comments: Supervision w/ BHR GAIT: Findings: Gait Characteristics: trendelenburg and Comments: left Modified indep level surfaces, CGA uneven  FUNCTIONAL TESTS:   11/17/23 5 times sit to stand: 18.78 sec Timed up and go (TUG): 19 sec w/ cane 10 meter walk test: 20 sec w/ cane = 1.64 ft/sec Berg Balance Test:42/56  M-CTSIB  Condition 1: Firm Surface, EO 30 Sec, Moderate Sway  Condition 2: Firm Surface, EC 6 Sec, Severe Sway  Condition 3: Foam Surface, EO  Sec,  Sway  Condition 4: Foam Surface, EC  Sec,  Sway    12/01/23 5 times sit to stand: 27.47 sec (R hand on knee, uses legs against bed) 10 meter walk test: 17.03 sec with  Lars Balance Test: 27/56 TUG: Deferred today due to pt L hip fatigued and starting to hurt after functional testing MCTSIB: Deferred today due to pt L  hip pain                                                                                                                                   GOALS: Goals reviewed with patient? Yes  SHORT TERM GOALS: Target date: 12/08/2023>>UPDATED STG 02/11/2024    Patient will be independent in HEP to improve functional outcomes Baseline: per report 02/09/2024 Goal status: MET, 02/09/2024  2.  Demo improved postural stability per mild-moderate sway x 30 sec condition 2 M-CTSIB to improve safety with ADL Baseline: 10 sec with mod-severe sway, 6-15 sec severe sway Goal status: NOT MET 02/09/2024  3.  Demo improved BLE strength and reduce risk for falls per time 15 sec 5xSTS test Baseline: 18 sec; 13 sec Goal status: MET    LONG TERM GOALS: Target date: 01/12/2024>>UPDATED TARGET 03/10/2024    Demo improved left hip abduction strength to 3+/5 for improved single limb support and to minimize gait dysfunction Baseline: 3-/5, Trendelenberg Goal status: IN PROGRESS, 01/19/2024  2.  Modified independent stair ambulation for improved safety with entering/exiting home Baseline: supervision BHR Goal status: IN PROGRESS, 01/19/2024  3.  Reduce risk for falls per score 48/56 Berg Balance Test Baseline 11/17/23: 42/56 12/01/23: 21/56; 01/19/2024 27/56 Goal status: IN PROGRESS, 01/19/2024  4.  Reduce risk for falls per time 14 sec TUG test Baseline: 19 sec w/ cane; 21 sec with 4WW 12/20/23 Goal status: IN PROGRESS 12/20/23  5.  Modified independent with uneven surfaces and curb negotiation to improve safety in community Baseline: CGA Goal status: IN PROGRESS 01/19/2024  6.  Pt will improve  5x sit<>stand to less than or equal to 13 sec to demonstrate improved functional strength and transfer efficiency.  Baseline:  18 sec 01/19/2024  Goals status:  IN PROGRESS      ASSESSMENT:  CLINICAL IMPRESSION: Reports marked improvement in right knee pain/function since recent aspiration.  Exhibits some residual  weakness to left tibialis anterior 4-/5 with some difficulty in foot clearance with stair ambulation as result with cues for awareness/exaggerating movement to accommodate with imrpoved performance thereafter. Activities to facilitate single limb support and weight acceptance to improve safety with negotiation of obstacles. Static multisensory balance to improve proprioceptive awareness and postural control to improve safey with ADL with difficulty under eyes closed conditions needing light tactile cues for limits of stability awareness. Continued sessions to advance POC details to improve mobility and progress to low risk for falls OBJECTIVE IMPAIRMENTS: Abnormal gait, decreased activity tolerance, decreased balance, difficulty walking, decreased strength, and postural dysfunction.   ACTIVITY LIMITATIONS: carrying, lifting, standing, stairs, transfers, reach over head, and locomotion level  PARTICIPATION LIMITATIONS: meal prep, cleaning, laundry, interpersonal relationship, driving, shopping, and community activity  PERSONAL FACTORS: Age, Time since onset of injury/illness/exacerbation, and 1-2 comorbidities: PMH are also affecting patient's functional outcome.   REHAB POTENTIAL: Good  CLINICAL DECISION MAKING: Evolving/moderate complexity  EVALUATION COMPLEXITY: Moderate  PLAN:  PT FREQUENCY: 1-2x/week  PT DURATION: 8 weeks per recert 01/19/2024  PLANNED INTERVENTIONS: [id number]- PT Re-evaluation, 97750- Physical Performance Testing, 97110-Therapeutic exercises, 97530- Therapeutic activity, 97112- Neuromuscular re-education, 97535- Self Care, [id number]- Manual therapy, [phone]- Gait training, [phone]- Orthotic Initial, [phone]- Canalith repositioning, [id number]- Aquatic Therapy, [phone]- Electrical stimulation (unattended), [phone] (1-2 muscles), [id number] (3+ muscles)- Dry Needling, Patient/Family education, Balance training, Stair training, Taping, Joint mobilization, Spinal mobilization, Vestibular training,  Cryotherapy, and Moist heat  PLAN FOR NEXT SESSION: *Needs resubmit for [facility] Medicare (Submitted 10/15) *make sure she's scheduled out to end of POC date*Review oculomotor HEP, vestibular training, strengthening, balance; progress HEP as appropriate   3:37 PM, 02/24/24 M. Kelly Karsten Vaughn, PT, DPT Physical Therapist- [place] Office Number: [phone]

## 2024-03-02 ENCOUNTER — Ambulatory Visit: Admitting: Occupational Therapy

## 2024-03-02 NOTE — Therapy (Deleted)
 OUTPATIENT OCCUPATIONAL THERAPY NEURO  Treatment Note  Patient Name: Annette Gross MRN: [id number] DOB:10-05-1940, 83 y.o., female Today's Date: 03/02/2024  PCP: Dr. Sharie REFERRING PROVIDER: Gayland Lauraine PARAS, NP  END OF SESSION:        Past Medical History:  Diagnosis Date   Anemia    many yrs ago   Anxiety    Asthma    no recent issues   Cancer [facility])    s/p thyroidectomy 2018   Complication of anesthesia    Cystitis, interstitial    Degenerative disc disease, lumbar    Degenerative joint disease    spinal stenosis Chronic low back pain   Depression    Diabetes mellitus    diet control.   Diverticulitis 2005   Diverticulosis 2005   Factor V deficiency (HCC)    Fibromyalgia    Fracture of right foot 1995   GERD (gastroesophageal reflux disease)    History of hiatal hernia    Hyperlipidemia    Hypertension    off med x 5 yrs.   IBS (irritable bowel syndrome)    Insomnia    Iron deficiency    Liver hemangioma 1999   MRI   Mononucleosis    Morton's neuroma    Left foot   Nuclear sclerotic cataract of left eye 08/29/2019   Peripheral neuropathy    Pneumonia    years 89 & 90.  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   CHOLECYSTECTOMY     COLONOSCOPY WITH PROPOFOL  N/A 09/02/2015   Procedure: COLONOSCOPY WITH PROPOFOL ;  Surgeon: Gladis MARLA Louder, MD;  Location: [facility];  Service: Endoscopy;  Laterality: N/A;   GANGLION CYST EXCISION     right   HEMORRHOID SURGERY     HERNIA REPAIR     repair was aimed at the [facility]   HIATAL HERNIA REPAIR     and nissen fundoplication   LAPAROSCOPIC ESOPHAGOGASTRIC FUNDOPLASTY     LAPAROSCOPIC INCISIONAL / UMBILICAL / VENTRAL HERNIA REPAIR     umbilical hernia   LOOP  RECORDER INSERTION N/A 09/13/2023   Procedure: LOOP RECORDER INSERTION;  Surgeon: Leverne Charlies Helling, PA-C;  Location: [facility] INVAS[facility] CV [facility];  Service: Cardiovascular;  Laterality: N/A;   OOPHORECTOMY     left   RIGHT OOPHORECTOMY     '05-laparaoscopic   SHOULDER SURGERY Right    THYROIDECTOMY N/A 07/06/2016   Procedure: TOTAL THYROIDECTOMY;  Surgeon: Krystal Russell, MD;  Location: [facility] OR;  Service: General;  Laterality: N/A;   TONSILLECTOMY     TOTAL THYROIDECTOMY  07/06/2016   TRANSESOPHAGEAL ECHOCARDIOGRAM (CATH LAB) N/A 09/13/2023   Procedure: TRANSESOPHAGEAL ECHOCARDIOGRAM;  Surgeon: Mona Vinie BROCKS, MD;  Location: [facility] INVAS[facility] CV LAB;  Service: Cardiovascular;  Laterality: N/A;   Patient Active Problem List   Diagnosis Date Noted   Hypertension    CVA (cerebral vascular accident) (HCC) 09/10/2023   Stroke (cerebrum) (HCC) 09/10/2023   Pseudophakia, both eyes 10/13/2021   Dermatochalasis of both lower eyelids 10/13/2021   Moderate nonproliferative diabetic retinopathy of left eye (HCC) 10/10/2020   Moderate nonproliferative diabetic retinopathy of right eye (HCC) 08/29/2019   Early stage nonexudative age-related macular degeneration of both eyes 08/29/2019  Right posterior capsular opacification 08/29/2019   Left thyroid  nodule 07/06/2016   Benign neoplasm of skin of upper limb, including shoulder 04/19/2012   Ventral hernia, recurrent 01/26/2011    ONSET DATE: 09/10/23  REFERRING DIAG: I63.9 (ICD-10-CM) - Acute CVA (cerebrovascular accident) (HCC)  THERAPY DIAG:  No diagnosis found.  Rationale for Evaluation and Treatment: Rehabilitation  SUBJECTIVE:   SUBJECTIVE STATEMENT: Pt reports completing putty sometimes. I do the visual tracking every day.   Pt accompanied by: self (dropped off by friend)  PERTINENT HISTORY: Presented to the ER 09/10/2023 after waking with impaired vision to her left eye, she could only see a crescent shaped area at the top of her field of vision.  A right lower quadrantanopsia was also noted. She went through her ophthalmologist was told she had a central retinal artery occlusion and was sent to the ER. She was admitted for painless loss of vision to the central field of the left eye. On MRI found to have multiple embolic appearing ischemic strokes.  PMH of DM II, anxiety, cancer, DJD, DM, fibromyalgia, HTN, HLD, IBS  PRECAUTIONS: Fall  WEIGHT BEARING RESTRICTIONS: No  PAIN:  Are you having pain? Yes: NPRS scale: 2 Pain location: R knee  - had a shot in it last week Pain description: aching  FALLS: Has patient fallen in last 6 months? Yes. Number of falls 2 - one fall down the basement stairs and 1 this morning getting down the steps when walking to the car  LIVING ENVIRONMENT: Lives with: lives with their family (son and his partner) Lives in: House/apartment Stairs: Yes: Internal: there are stairs to the basement, but does not need to go down those steps; and External: 6 steps; bilateral but cannot reach both Has following equipment at home: Single point cane, Walker - 4 wheeled, and shower chair  PLOF: Independent, Independent with basic ADLs, and Leisure: enjoys drawing and painting  PATIENT GOALS: to be aware of vision, handwriting  OBJECTIVE:  Note: Objective measures were completed at Evaluation unless otherwise noted.  HAND DOMINANCE: Ambidextrous - writes with R hand, eats with L hand  ADLs: Transfers/ambulation related to ADLs: Uses Rollator in the home to transport items, SPC in the community Eating: reports decreased depth perception when pouring drink into cup, reports having to look at food when scooping Grooming: Mod I UB Dressing: Mod I - does have difficulty with bra but is able to complete without assist LB Dressing: Mod I Toileting: Mod I Bathing: Mod I  Tub Shower transfers: Mod I Equipment: Shower seat without back  IADLs: Light housekeeping: helps with the dishes, emptying dishwasher, sweeping,  folding and puttying away laundry Meal Prep: simple meal prep with sandwiches and cereal, is not primary cook now that she lives with family of 5 Community mobility: not cleared to resume driving s/p CVA Medication management: plans to get a pill box to organize all meds Handwriting: 100% legible and 17.5 sec for PPT #1 ([place])  MOBILITY STATUS: Hx of falls, uses Rollator in home occasionally and SPC in community  POSTURE COMMENTS:  Scoliosis with pelvic obliquity  ACTIVITY TOLERANCE: Activity tolerance: diminished  FUNCTIONAL OUTCOME MEASURES: 9 hole peg test: 9 Hole Peg test: Right: 34.72 sec; Left: 30.94 sec (pt asking about next step after filling peg board - ?difficulty with 2 step commands)  PSFS: TBD  UPPER EXTREMITY ROM:    Active ROM Right eval Left eval  Shoulder flexion 142 112 ( reports she  is awaiting surgery)  Shoulder abduction    Shoulder adduction    Shoulder extension    Shoulder internal rotation [facility]  Shoulder external rotation [facility]  Elbow flexion [facility] WFL  Elbow extension [facility] WFL  Wrist flexion [facility] WFL  Wrist extension [facility] WFL  Wrist ulnar deviation    Wrist radial deviation    Wrist pronation    Wrist supination    (Blank rows = not tested)  UPPER EXTREMITY MMT:     MMT Right eval Left eval  Shoulder flexion 4- 3+(reports awaiting surgery)  Shoulder abduction    Shoulder adduction    Shoulder extension    Shoulder internal rotation    Shoulder external rotation    Middle trapezius    Lower trapezius    Elbow flexion    Elbow extension    Wrist flexion    Wrist extension    Wrist ulnar deviation    Wrist radial deviation    Wrist pronation    Wrist supination    (Blank rows = not tested)  COORDINATION: Finger Nose Finger test: [facility] 9 Hole Peg test: Right: 34.72 sec; Left: 30.94 sec (pt asking about next step after filling peg board - ?difficulty with 2 step commands) Box and Blocks:  Right 41 blocks, Left  43 blocks (hit barrier x3 with R, x2 with L)  12/16/23 9 hole peg test: Right: 31.56 sec; Left: 29.25   02/16/24: 02/16/24: Right: 32.79 sec; Left 29.03  SENSATION: WFL   COGNITION: Overall cognitive status: Pt reports thinking skills are not quite the same (reports difficulty with crossword puzzles than before) and reports increased time to think through something.  VISION: Subjective report: vision is improving Baseline vision: Wears glasses for reading only Visual history: cataracts (s/p removal)  VISION ASSESSMENT: Impaired Ocular ROM: WFL Gaze preference/alignment: WDL Tracking/Visual pursuits: Able to track stimulus in all quads without difficulty Saccades: additional eye shifts occurred during testing Visual Fields: Right visual field deficits Depth perception: pt reports decreased depth perception, did not recognize stimulus until nearly at midline on L side Some decreased recognition of simulus on L vision field with confrontation testing  Patient has difficulty with following activities due to following visual impairments: embroidering, pouring drink into cup, handwriting                                                                                                                            TREATMENT DATE:  03/03/24 Assess goals, d/c for re-cert   89/84/74 Re-assessed 9HPT, handwriting simple sentence, and Bell Cancellation Test. Updated goals, see below. Informed pt of decreased FM coordination in R hand, pt in agreement with continuing OT to continue to address R FM coordination tasks. Pt urged to complete FM activities from tx at home every day. When asked if there were any tasks she used to do or had more difficulty doing at this time, pt reported I used to sew, I stopped doing that  about a year ago.  Completed visual scanning activity, instructing pt to find cones hidden throughout gym. Pt found 8/9, 4/5 on L side and 4/4 on R side. Some unsafe tendencies  noted including pt not turning rollator with her, pt educated in importance of keeping rollator close to self to reduce risk of falling.   02/09/24 Pt reported dizziness: 156/89 BP and 87 HR. PT notified. Monitored throughout tx.  Educated pt in energy conservation, see Pt instructions for detailed handout to promote safety and quality of life in the home.   Educated pt in obtaining activities to address visual tracking as well as FM coordination and alternating attention online (ie: spot the difference, word searches).    Completed visual tracking test, located 7/8 of designated letters on first test, then 28/31 numbers on second test (test originally had 32 but one was cut off d/t the copier).   Educated pt in final coordination activity for carryover with handwriting tasks, instructed to find 4-5 small items in light resistance yellow theraputty. Pt instructed in proper care and storage of putty. Measured BP once more, 124/89 and 89 HR. PT notified of vitals.  01/19/24 Bell cancellation: pt locating 31 of 35 bells in 3:13.  Pt demonstrating good linear scanning, however still omitting 4.Pt omitting bells primarily in middle of picture.  Upon review, pt stating that she had a hard time seeing the missing ones and reports that when she is reading that she will occasionally miss the middle. Trail making: completed Trail A in 52.72 seconds with no errors.  Completed Trail B in 2:14 with no errors.  Pt picking up pen intermittently but no errors or doubling back.   Handwriting: Pt writing name and simple sentence with 100% legibility.  Pt reports that it is worse than it used to be and feels that it is her coordination that is impacting it. Pegs: Right: 30.0 sec and Left: 29.5 sec.  Discussed norms with pt expressing desire to continue to work with coordination. Coordination: provided pt with coordination HEP (see pt instructions) and providing demonstration of each and modifications to card tasks to  increase cognitive challenge and alternating attention.      PATIENT EDUCATION: Education details: Energy conservation Person educated: Patient Education method: Explanation, Verbal cues, and Handouts Education comprehension: verbalized understanding and needs further education  HOME EXERCISE PROGRAM: 11/17/23 - depth perception activities (see pt instructions)   GOALS: Goals reviewed with patient? Yes    SHORT TERM GOALS: Target date: 02/25/24  Pt will be independent with vision HEP with use of handouts. Baseline: new to OPOT Goal status: in progress  2.  Pt will be independent with coordination HEP with use of handouts. Baseline: new to OPOT Goal status:  in progress  4.  Pt will improve visual scanning/use compensatory strategies to perform cancellation task with 92% accuracy.  Baseline: 31/35 on Bell cancellation task 02/16/24: 32/35 on Bell cancellation Goal status:  REVISED  LONG TERM GOALS: Target date: 03/03/24  1.Pt will demonstrate improved fine motor coordination for ADLs as evidenced by decreasing 9 hole peg test score for RUE by 3 secs from 01/19/24 data. Baseline: Right: 34.72 sec; Left: 30.94 sec  12/16/23: Right: 31.56 sec 01/19/24: Right: 30.0 sec, Left: 29.5 sec 02/16/24: Right: 32.79 sec; Left 29.03 Goal status: REVISED  2.  Pt will navigate a moderately busy environment, completing dual task activity and/or following multi-step commands with 90% accuracy. Baseline: difficulty with 2 step commands Goal status: on going  3.  Pt will demonstrate improved coordination to allow for improved speed with handwriting to completing simple sentence in <15 seconds without compromising 100% legibility. Baseline: 17.5 sec 01/19/24: 18.25 sec with 100% legibility 02/16/24: 19.38 with 100% legibility Goal status: on going  4.  Pt will verbalize and/or demonstrate understanding of energy conservation strategies to increase engagement in and safety with ADLs and  IADLs.  Baseline: new to OPOT Goal status: on going   ASSESSMENT:  CLINICAL IMPRESSION: Patient is a 83 y.o. female who was seen today for occupational therapy treatment session with focus on vision and coordination s/p CVA. Pt appears to demonstrate improved visual scanning, but tendency to favor R side of field of vision is still noted. Noted decrease in coordination in R hand per 9HPT. Pt informed of this and is in agreement to continue skilled OT at this time to improve coordination in R hand to carry over with ADL and leisure tasks.   PERFORMANCE DEFICITS: in functional skills including ADLs, IADLs, coordination, pain, Fine motor control, Gross motor control, balance, body mechanics, endurance, decreased knowledge of precautions, decreased knowledge of use of DME, vision, and UE functional use, cognitive skills including memory and safety awareness, and psychosocial skills including coping strategies, environmental adaptation, and routines and behaviors.     PLAN:  OT FREQUENCY: 1-2x/week  OT DURATION: 6 weeks  PLANNED INTERVENTIONS: 97168 OT Re-evaluation, 97535 self care/ADL training, 02889 therapeutic exercise, 97530 therapeutic activity, 97112 neuromuscular re-education, 97140 manual therapy, balance training, functional mobility training, visual/perceptual remediation/compensation, psychosocial skills training, energy conservation, coping strategies training, patient/family education, and DME and/or AE instructions  RECOMMENDED OTHER SERVICES: NA  CONSULTED AND AGREED WITH PLAN OF CARE: Patient  PLAN FOR NEXT SESSION: R FM Coordination tasks; 2-3 step directions for recall, alternating attention tasks      Kadra Kohan, LAURAINE, OTR/L 03/02/2024, 1:59 PM  [facility] at [facility] [address] [place], [place] [address] Phone # [phone] Fax # [phone]

## 2024-03-06 ENCOUNTER — Telehealth: Payer: Self-pay

## 2024-03-06 ENCOUNTER — Encounter

## 2024-03-06 ENCOUNTER — Ambulatory Visit

## 2024-03-06 DIAGNOSIS — I639 Cerebral infarction, unspecified: Secondary | ICD-10-CM

## 2024-03-06 LAB — CUP PACEART REMOTE DEVICE CHECK
Date Time Interrogation Session: 20251102235558
Implantable Pulse Generator Implant Date: 20250512

## 2024-03-06 NOTE — Telephone Encounter (Signed)
 MDT ILR alert: AF Implanted for CVA in hospital by Dr. Cindie in July 2025, now following Dr. Kennyth.   Event occurred 10/31 @ 19:42, duration , HR's 136-158, dot plot c/w irregular R-R, no consistent discernible P wave, can not rule out AF.   Forwarding to Dr. Kennyth for rhythm confirmation -  LM for patient to assess symptoms.

## 2024-03-07 NOTE — Telephone Encounter (Signed)
 Call back received from Pt.  Pt scheduled with Afib clinic March 10, 2024 at 3:30 pm.  Directions given.

## 2024-03-07 NOTE — Telephone Encounter (Signed)
LM on VM and sent my chart message

## 2024-03-08 ENCOUNTER — Encounter

## 2024-03-08 ENCOUNTER — Telehealth: Payer: Self-pay

## 2024-03-08 ENCOUNTER — Ambulatory Visit: Attending: Neurology

## 2024-03-08 NOTE — Telephone Encounter (Signed)
 Pt no-showed for OT appointment today. Therefore, OT called pt's listed phone number [phone]). OT left voicemail reminding pt of next OT appointment date/time, provided education on cancellation/no-show policy, recommended to pt to call clinic office if pt is unable to make appointments, and provided contact information of clinic.  Rocky Dutch, OTR/L

## 2024-03-09 NOTE — Progress Notes (Signed)
 Remote Loop Recorder Transmission

## 2024-03-10 ENCOUNTER — Ambulatory Visit (HOSPITAL_COMMUNITY)
Admission: RE | Admit: 2024-03-10 | Discharge: 2024-03-10 | Disposition: A | Discharge status: Home / Self Care | Source: Ambulatory Visit | Attending: Internal Medicine | Admitting: Internal Medicine

## 2024-03-10 ENCOUNTER — Encounter (HOSPITAL_COMMUNITY): Payer: Self-pay | Admitting: Internal Medicine

## 2024-03-10 VITALS — BP 136/80 | HR 107 | Ht 59.5 in | Wt 155.2 lb

## 2024-03-10 DIAGNOSIS — D6869 Other thrombophilia: Secondary | ICD-10-CM | POA: Diagnosis not present

## 2024-03-10 DIAGNOSIS — I4891 Unspecified atrial fibrillation: Secondary | ICD-10-CM

## 2024-03-10 DIAGNOSIS — I480 Paroxysmal atrial fibrillation: Secondary | ICD-10-CM

## 2024-03-10 MED ORDER — APIXABAN 5 MG PO TABS
5.0000 mg | ORAL_TABLET | Freq: Two times a day (BID) | ORAL | 3 refills | Status: AC
Start: 2024-03-10 — End: ?

## 2024-03-10 NOTE — Patient Instructions (Addendum)
 Stop plavix    Start Eliquis 5 mg twice a day   You may go to any [facility] Location for your lab work in One month 04/09/24  [place] - [address] ([facility]) - [address] [phone] [address]    If you have any lab test that is abnormal or we need to change your treatment, we will call you

## 2024-03-10 NOTE — Progress Notes (Signed)
 Primary Care Physician: Ransom Other, MD Primary Cardiologist: None Electrophysiologist: None     Referring Physician: Device clinic / Dr. Kennyth Ronal FORBES Annette Gross is a 83 y.o. female with a history of recurrent CVA s/p ILR implantation 09/2023, HTN, HLD, GERD, factor V Leiden mutation, aortic and branch vessel atherosclerosis by CT imaging, macular degeneration, T2DM, and hypothyroidism who presents for consultation in the [facility]. Device clinic alert on 03/06/24 for likely Afib with RVR and duration 10 minutes. Patient is on plavix  for stroke prevention.  On evaluation today, patient notes occasional fluttering but states it is brief and typically resolves on its own. She slipped off the bed yesterday and smacked her head on the wall. She notes this is the first time she has fallen all year. She takes plavix  daily.   Today, she denies symptoms of palpitations, chest pain, shortness of breath, orthopnea, PND, lower extremity edema, dizziness, presyncope, syncope, bleeding, or neurologic sequela. The patient is tolerating medications without difficulties and is otherwise without complaint today.   she has a BMI of Body mass index is 30.82 kg/m.SABRA Filed Weights   03/10/24 1538  Weight: 70.4 kg    Current Outpatient Medications  Medication Sig Dispense Refill   acetaminophen  (TYLENOL ) 500 MG tablet Take 1,000 mg by mouth 2 (two) times daily as needed for moderate pain (pain score 4-6) or headache.     albuterol  (VENTOLIN  HFA) 108 (90 Base) MCG/ACT inhaler Inhale 1-2 puffs into the lungs every 6 (six) hours as needed for shortness of breath or wheezing.     apixaban (ELIQUIS) 5 MG TABS tablet Take 1 tablet (5 mg total) by mouth 2 (two) times daily. 60 tablet 3   ascorbic acid (VITAMIN C) 500 MG tablet Take 500 mg by mouth daily.     Cholecalciferol (VITAMIN D-3 PO) Take 1 capsule by mouth daily.     Cyanocobalamin  (VITAMIN B-12 PO) Take 1 tablet by mouth  daily.     DULoxetine  (CYMBALTA ) 60 MG capsule Take 60 mg by mouth at bedtime.     Evolocumab (REPATHA) 140 MG/ML SOSY Inject 140 mg into the skin See admin instructions. Inject 1 dose (140mg ) subcutaneously every 14 days on every other Tuesday.     fluticasone  (FLONASE ) 50 MCG/ACT nasal spray Place 1 spray into the nose daily.     levocetirizine (XYZAL ) 5 MG tablet Take 5 mg by mouth at bedtime.  3   levothyroxine  (SYNTHROID ) 88 MCG tablet Take 88 mcg by mouth daily before breakfast.     losartan  (COZAAR ) 25 MG tablet Take 25 mg by mouth at bedtime.     meclizine  (ANTIVERT ) 25 MG tablet Take 1 tablet (25 mg total) by mouth 3 (three) times daily as needed for dizziness. 20 tablet 0   metFORMIN  (GLUCOPHAGE ) 500 MG tablet Take 1,000 mg by mouth 2 (two) times daily after a meal.     Multiple Vitamins-Minerals (PRESERVISION AREDS 2) CAPS Take 1 capsule by mouth daily at 12 noon.     omeprazole (PRILOSEC) 40 MG capsule Take 40 mg by mouth at bedtime.     No current facility-administered medications for this encounter.    Atrial Fibrillation Management history:  Previous antiarrhythmic drugs: none Previous cardioversions: none Previous ablations: none Anticoagulation history: none   ROS- All systems are reviewed and negative except as per the HPI above.  Physical Exam: BP 136/80   Pulse (!) 107   Ht 4' 11.5 (1.511  m)   Wt 70.4 kg   BMI 30.82 kg/m   GEN: Well nourished, well developed in no acute distress NECK: No JVD; No carotid bruits CARDIAC: Regular rate and rhythm, no murmurs, rubs, gallops RESPIRATORY:  Clear to auscultation without rales, wheezing or rhonchi  ABDOMEN: Soft, non-tender, non-distended EXTREMITIES:  No edema; No deformity   EKG today demonstrates  Vent. rate 107 BPM PR interval 240 ms QRS duration 118 ms QT/QTcB 372/496 ms P-R-T axes * 159 2 Sinus tachycardia with 1st degree A-V block with Premature atrial complexes Indeterminate axis Right bundle branch  block Abnormal ECG When compared with ECG of 26-Nov-2023 01:39, PREVIOUS ECG IS PRESENT Confirmed by Terra Pac [phone]) on 03/10/2024 4:08:48 PM  Echo 09/11/23 demonstrated   1. Left ventricular ejection fraction, by estimation, is 60 to 65%. The  left ventricle has normal function. The left ventricle has no regional  wall motion abnormalities. There is mild concentric left ventricular  hypertrophy. Left ventricular diastolic  parameters are indeterminate.   2. Right ventricular systolic function is mildly reduced. The right  ventricular size is mildly enlarged.   3. A small pericardial effusion is present. The pericardial effusion is  circumferential.   4. The mitral valve is normal in structure. No evidence of mitral valve  regurgitation. No evidence of mitral stenosis.   5. The aortic valve is calcified. There is mild calcification of the  aortic valve. There is moderate thickening of the aortic valve. Aortic  valve regurgitation is not visualized. No aortic stenosis is present.   6. The inferior vena cava is normal in size with greater than 50%  respiratory variability, suggesting right atrial pressure of 3 mmHg.   ASSESSMENT & PLAN CHA2DS2-VASc Score = 8  The patient's score is based upon: CHF History: 0 HTN History: 1 Diabetes History: 1 Stroke History: 2 Vascular Disease History: 1 Age Score: 2 Gender Score: 1       ASSESSMENT AND PLAN: Paroxysmal Atrial Fibrillation (ICD10:  I48.0) The patient's CHA2DS2-VASc score is 8, indicating a 10.8% annual risk of stroke.    Patient is currently in NSR. Education provided about Afib. Discussion about triggers for Afib and medication treatments going forward if indicated. After discussion, we will proceed with conservative observation at this time via subsequent ILR checks.    Secondary Hypercoagulable State (ICD10:  D68.69) The patient is at significant risk for stroke/thromboembolism based upon her CHA2DS2-VASc Score of 8.   Start Apixaban (Eliquis).  We discussed the reasoning behind anticoagulation in the setting of stroke prevention related to Afib. We discussed the benefits vs risks of anticoagulation. After discussion, patient would like to begin anticoagulation and understands the potential risks. Will begin Eliquis 5 mg BID and print order for patient to have CBC in 1 month. Stop Plavix  today.    Follow up 3 months Afib clinic, sooner if increased burden noted on ILR.    Terra Pac, [facility] [address] [place], [place] [address] [phone]

## 2024-03-21 ENCOUNTER — Encounter

## 2024-03-22 ENCOUNTER — Encounter

## 2024-04-06 ENCOUNTER — Ambulatory Visit: Attending: Cardiology

## 2024-04-06 ENCOUNTER — Encounter

## 2024-04-06 LAB — CUP PACEART REMOTE DEVICE CHECK
Date Time Interrogation Session: 20251204004332
Implantable Pulse Generator Implant Date: 20250512

## 2024-04-10 ENCOUNTER — Ambulatory Visit: Admitting: Podiatry

## 2024-04-12 NOTE — Progress Notes (Signed)
 Remote Loop Recorder Transmission

## 2024-04-21 ENCOUNTER — Ambulatory Visit: Payer: Self-pay | Admitting: Cardiology

## 2024-04-22 ENCOUNTER — Emergency Department (HOSPITAL_COMMUNITY)

## 2024-04-22 ENCOUNTER — Other Ambulatory Visit: Payer: Self-pay

## 2024-04-22 ENCOUNTER — Emergency Department (HOSPITAL_COMMUNITY)
Admission: EM | Admit: 2024-04-22 | Discharge: 2024-04-22 | Disposition: A | Discharge status: Home / Self Care | Attending: Emergency Medicine | Admitting: Emergency Medicine

## 2024-04-22 ENCOUNTER — Encounter (HOSPITAL_COMMUNITY): Payer: Self-pay

## 2024-04-22 DIAGNOSIS — R55 Syncope and collapse: Secondary | ICD-10-CM | POA: Diagnosis not present

## 2024-04-22 DIAGNOSIS — Z7901 Long term (current) use of anticoagulants: Secondary | ICD-10-CM | POA: Insufficient documentation

## 2024-04-22 DIAGNOSIS — I480 Paroxysmal atrial fibrillation: Secondary | ICD-10-CM | POA: Diagnosis not present

## 2024-04-22 DIAGNOSIS — E119 Type 2 diabetes mellitus without complications: Secondary | ICD-10-CM | POA: Diagnosis not present

## 2024-04-22 DIAGNOSIS — J45909 Unspecified asthma, uncomplicated: Secondary | ICD-10-CM | POA: Diagnosis not present

## 2024-04-22 DIAGNOSIS — I10 Essential (primary) hypertension: Secondary | ICD-10-CM | POA: Diagnosis not present

## 2024-04-22 DIAGNOSIS — Z79899 Other long term (current) drug therapy: Secondary | ICD-10-CM | POA: Diagnosis not present

## 2024-04-22 DIAGNOSIS — Z85850 Personal history of malignant neoplasm of thyroid: Secondary | ICD-10-CM | POA: Insufficient documentation

## 2024-04-22 LAB — CBC
HCT: 42.3 % (ref 36.0–46.0)
Hemoglobin: 14.2 g/dL (ref 12.0–15.0)
MCH: 30.6 pg (ref 26.0–34.0)
MCHC: 33.6 g/dL (ref 30.0–36.0)
MCV: 91.2 fL (ref 80.0–100.0)
Platelets: 451 K/uL — ABNORMAL HIGH (ref 150–400)
RBC: 4.64 MIL/uL (ref 3.87–5.11)
RDW: 13.5 % (ref 11.5–15.5)
WBC: 10.2 K/uL (ref 4.0–10.5)
nRBC: 0 % (ref 0.0–0.2)

## 2024-04-22 LAB — DIFFERENTIAL
Abs Immature Granulocytes: 0.03 K/uL (ref 0.00–0.07)
Basophils Absolute: 0.1 K/uL (ref 0.0–0.1)
Basophils Relative: 1 %
Eosinophils Absolute: 0.3 K/uL (ref 0.0–0.5)
Eosinophils Relative: 3 %
Immature Granulocytes: 0 %
Lymphocytes Relative: 31 %
Lymphs Abs: 3.2 K/uL (ref 0.7–4.0)
Monocytes Absolute: 1.1 K/uL — ABNORMAL HIGH (ref 0.1–1.0)
Monocytes Relative: 10 %
Neutro Abs: 5.6 K/uL (ref 1.7–7.7)
Neutrophils Relative %: 55 %

## 2024-04-22 LAB — COMPREHENSIVE METABOLIC PANEL WITH GFR
ALT: 9 U/L (ref 0–44)
AST: 17 U/L (ref 15–41)
Albumin: 3.9 g/dL (ref 3.5–5.0)
Alkaline Phosphatase: 99 U/L (ref 38–126)
Anion gap: 13 (ref 5–15)
BUN: 10 mg/dL (ref 8–23)
CO2: 26 mmol/L (ref 22–32)
Calcium: 9 mg/dL (ref 8.9–10.3)
Chloride: 98 mmol/L (ref 98–111)
Creatinine, Ser: 0.65 mg/dL (ref 0.44–1.00)
GFR, Estimated: >60 mL/min
Glucose, Bld: 142 mg/dL — ABNORMAL HIGH (ref 70–99)
Potassium: 4 mmol/L (ref 3.5–5.1)
Sodium: 137 mmol/L (ref 135–145)
Total Bilirubin: 0.4 mg/dL (ref 0.0–1.2)
Total Protein: 6.4 g/dL — ABNORMAL LOW (ref 6.5–8.1)

## 2024-04-22 LAB — I-STAT CHEM 8, ED
BUN: 9 mg/dL (ref 8–23)
Calcium, Ion: 1 mmol/L — ABNORMAL LOW (ref 1.15–1.40)
Chloride: 98 mmol/L (ref 98–111)
Creatinine, Ser: 0.7 mg/dL (ref 0.44–1.00)
Glucose, Bld: 144 mg/dL — ABNORMAL HIGH (ref 70–99)
HCT: 43 % (ref 36.0–46.0)
Hemoglobin: 14.6 g/dL (ref 12.0–15.0)
Potassium: 3.9 mmol/L (ref 3.5–5.1)
Sodium: 137 mmol/L (ref 135–145)
TCO2: 24 mmol/L (ref 22–32)

## 2024-04-22 LAB — PROTIME-INR
INR: 1.2 (ref 0.8–1.2)
Prothrombin Time: 15.4 s — ABNORMAL HIGH (ref 11.4–15.2)

## 2024-04-22 LAB — APTT: aPTT: 31 s (ref 24–36)

## 2024-04-22 LAB — ETHANOL: Alcohol, Ethyl (B): <15 mg/dL

## 2024-04-22 LAB — CBG MONITORING, ED: Glucose-Capillary: 139 mg/dL — ABNORMAL HIGH (ref 70–99)

## 2024-04-22 MED ORDER — SODIUM CHLORIDE 0.9% FLUSH: 3.0000 mL | Freq: Once | INTRAVENOUS | Status: DC

## 2024-04-22 NOTE — ED Provider Notes (Signed)
 Patient care assumed as a handoff from prior provider. Case discussed in detail at signout including history, physical exam findings, diagnostic workup, and treatment course up to this point.  I have reviewed the chart, labs, imaging, and clinical course.   Please refer to initial ED note since the prior ED provider primarily managed this patient.  I am assuming care in the later phase of the ED visit.  I have reevaluated the patient to confirm clinical stability and plan of care.    Physical Exam  BP 105/65   Pulse 99   Temp 98.1 F (36.7 C)   Resp 17   Wt 69.8 kg   SpO2 96%   BMI 30.56 kg/m   Physical Exam  Procedures  Procedures  ED Course / MDM    Medical Decision Making Amount and/or Complexity of Data Reviewed Labs: ordered. Radiology: ordered.   Pending loop recorder interrogation   635pm: Loop recorder interrogation results returned.  No events marked      Corinthia No, DO 04/22/24 1835

## 2024-04-22 NOTE — ED Triage Notes (Signed)
 Pt arrives via EMS from home. Pt had a syncopal episode after getting out of the shower . Upon ems arrival, paramedics noticed some slurred speech and aphasia. LKW is around 1200. Pt arrives AxOx4. Symptoms have resolved with the exception of some decreased sensation on her RLE. Pt is on Eliquis . She denies hitting her head.

## 2024-04-22 NOTE — Code Documentation (Signed)
 Stroke Response Nurse Documentation Code Documentation  Annette Gross is a 83 y.o. female arriving to Annette [facility]  via Annette Gross on 04-22-2024 with past medical hx of CVA, HTN,  . On Eliquis  (apixaban ) daily. Code stroke was activated by Gross.   Patient from home where she was LKW at 1200 and now complaining of intermittent aphasia.  She states that she had not slept well last night and slept in this morning.  She felt fine prior to getting in the shower about noon.  Then she became dizzy and fell.  She denies hitting her head.   Stroke team at the bedside on patient arrival. Labs drawn and patient cleared for CT by EDP. Patient to CT with team. NIHSS 1, see documentation for details and code stroke times. Patient with right decreased sensation on exam. The following imaging was completed:  CT Head. Patient is not a candidate for IV Thrombolytic due to being on Eliquis . Patient is not a candidate for IR due to no LVO suspected.   Care Plan: VS & NIHSS q 2 hours x 12 hours then q 4 hours.  .   Bedside handoff with ED RN Issac.    Elvin Portland  Stroke Response RN

## 2024-04-22 NOTE — ED Provider Notes (Signed)
 "  Emergency Department Provider Note   I have reviewed the triage vital signs and the nursing notes.   HISTORY  Chief Complaint Code Stroke   HPI Annette Gross is a 83 y.o. female with past history reviewed below presents to the emergency department after having a syncope event.  Patient was out of the shower and drying herself off when she bent over and felt lightheaded.  She sat down on her bottom without striking her head.  She had some generalized weakness.  EMS was called and noticed that she had some slurred speech and prompted them to activate a code stroke.  Last normal was noon.  She is currently feeling well.  No chest pain or heart palpitations.  She remains compliant on her Eliquis .   Past Medical History:  Diagnosis Date   Anemia    many yrs ago   Anxiety    Asthma    no recent issues   Cancer [facility])    s/p thyroidectomy 2018   Complication of anesthesia    Cystitis, interstitial    Degenerative disc disease, lumbar    Degenerative joint disease    spinal stenosis Chronic low back pain   Depression    Diabetes mellitus    diet control.   Diverticulitis 2005   Diverticulosis 2005   Factor V deficiency (HCC)    Fibromyalgia    Fracture of right foot 1995   GERD (gastroesophageal reflux disease)    History of hiatal hernia    Hyperlipidemia    Hypertension    off med x 5 yrs.   IBS (irritable bowel syndrome)    Insomnia    Iron deficiency    Liver hemangioma 1999   MRI   Mononucleosis    Morton's neuroma    Left foot   Nuclear sclerotic cataract of left eye 08/29/2019   Peripheral neuropathy    Pneumonia    years 89 & 89.  None since   PONV (postoperative nausea and vomiting)    nausea no vomiting   Rectocele    Transfusion history    child x2, many yrs ago after childbirthhemorrhage   Umbilical hernia 2012    Review of Systems  Constitutional: No fever/chills Cardiovascular: Denies chest pain. Positive syncope.  Respiratory: Denies  shortness of breath. Gastrointestinal: No abdominal pain.  No nausea, no vomiting.   Genitourinary: Negative for dysuria. Neurological: Negative for headaches, focal weakness or numbness.  ____________________________________________   PHYSICAL EXAM:  VITAL SIGNS: Vitals:   04/22/24 1815 04/22/24 2010  BP: (!) 129/103   Pulse: 98   Resp: 19   Temp:  98 F (36.7 C)  SpO2: 97%     Constitutional: Alert and oriented. Well appearing and in no acute distress. Eyes: Conjunctivae are normal. PERRL. EOMI. Head: Atraumatic. Nose: No congestion/rhinnorhea. Mouth/Throat: Mucous membranes are moist.   Neck: No stridor. No cervical spine tenderness to palpation. Cardiovascular: Normal rate, regular rhythm. Good peripheral circulation. Grossly normal heart sounds.   Respiratory: Normal respiratory effort.  No retractions. Lungs CTAB. Gastrointestinal: Soft and nontender. No distention.  Musculoskeletal: No lower extremity tenderness nor edema. No gross deformities of extremities. Neurologic:  Normal speech and language. No gross focal neurologic deficits are appreciated. 5/5 strength in the bilateral upper and lower extremities.  Normal sensation throughout. Skin:  Skin is warm, dry and intact. No rash noted.  ____________________________________________   LABS (all labs ordered are listed, but only abnormal results are displayed)  Labs Reviewed  PROTIME-INR -  Abnormal; Notable for the following components:      Result Value   Prothrombin Time 15.4 (*)    All other components within normal limits  CBC - Abnormal; Notable for the following components:   Platelets 451 (*)    All other components within normal limits  DIFFERENTIAL - Abnormal; Notable for the following components:   Monocytes Absolute 1.1 (*)    All other components within normal limits  COMPREHENSIVE METABOLIC PANEL WITH GFR - Abnormal; Notable for the following components:   Glucose, Bld 142 (*)    Total Protein  6.4 (*)    All other components within normal limits  I-STAT CHEM 8, ED - Abnormal; Notable for the following components:   Glucose, Bld 144 (*)    Calcium , Ion 1.00 (*)    All other components within normal limits  CBG MONITORING, ED - Abnormal; Notable for the following components:   Glucose-Capillary 139 (*)    All other components within normal limits  APTT  ETHANOL   ____________________________________________  EKG   EKG Interpretation Date/Time:  Saturday April 22 2024 13:44:00 EST Ventricular Rate:  100 PR Interval:  189 QRS Duration:  129 QT Interval:  388 QTC Calculation: 501 R Axis:   141  Text Interpretation: Sinus tachycardia Atrial premature complexes RBBB and LPFB Similar to prior Confirmed by Darra Chew [phone]) on 04/22/2024 1:59:03 PM        ____________________________________________  RADIOLOGY  CT Cervical Spine Wo Contrast Result Date: 04/22/2024 EXAM: CT CERVICAL SPINE WITHOUT CONTRAST 04/22/2024 01:22:35 PM TECHNIQUE: CT of the cervical spine was performed without the administration of intravenous contrast. Multiplanar reformatted images are provided for review. Automated exposure control, iterative reconstruction, and/or weight based adjustment of the mA/kV was utilized to reduce the radiation dose to as low as reasonably achievable. COMPARISON: None available. CLINICAL HISTORY: Neck trauma (Age >= 65y) FINDINGS: BONES AND ALIGNMENT: Straightening of the normal cervical lordosis is present. No acute fracture or traumatic malalignment. DEGENERATIVE CHANGES: Multilevel degenerative changes are present throughout the cervical spine. Uncovertebral and facet spurring contribute to moderate left foraminal narrowing at C3-C4, severe left and moderate right foraminal narrowing at C4-C5, moderate foraminal narrowing, worse on the right at C5-C6, and moderate right foraminal narrowing at C6-C7. Moderate foraminal narrowing bilaterally at C7-T1. Central canal  stenosis is greatest at C5-C6 and C6-C7. SOFT TISSUES: No prevertebral soft tissue swelling. Atherosclerotic calcifications are present at the carotid bifurcations bilaterally without definite stenosis. IMPRESSION: 1. No acute fracture or traumatic malalignment. 2. Multilevel degenerative changes with moderate to severe foraminal narrowing and central canal stenosis, greatest at C5-C6 and C6-C7. Electronically signed by: Lonni Necessary MD 04/22/2024 01:31 PM EST RP Workstation: [id number]   CT HEAD CODE STROKE WO CONTRAST Result Date: 04/22/2024 EXAM: CT HEAD WITHOUT CONTRAST 04/22/2024 01:18:39 PM TECHNIQUE: CT of the head was performed without the administration of intravenous contrast. Automated exposure control, iterative reconstruction, and/or weight based adjustment of the mA/kV was utilized to reduce the radiation dose to as low as reasonably achievable. COMPARISON: 09/10/2023 CLINICAL HISTORY: Neuro deficit, acute, stroke suspected. FINDINGS: BRAIN AND VENTRICLES: No acute hemorrhage. No evidence of acute infarct. No hydrocephalus. No extra-axial collection. No mass effect or midline shift. Generalized cerebral volume loss. Cavum septum pellucidum et vergae, common anatomic variant. Anterior right frontal calcified meningioma measuring 0.6 cm. Intracranial vascular calcifications. Lacunar infarcts are present in the right caudate head and left lentiform nucleus. Moderate generalized atrophy and diffuse white matter disease is similar to the  prior exams. ORBITS: No acute abnormality. SINUSES: No acute abnormality. SOFT TISSUES AND SKULL: No acute soft tissue abnormality. No skull fracture. [place] Stroke Program Early CT Score (ASPECTS) ----- Ganglionic (caudate, ic, lentiform nucleus, insula, M1-m3): 7 Supraganglionic (m4-m6): 3 Total: 10 IMPRESSION: 1. No acute intracranial abnormality. ASPECTS 10. 2. Moderate generalized atrophy and diffuse white matter disease, similar to prior exams. 3. Lacunar  infarcts in the right caudate head and left lentiform nucleus. 4. These results were communicated to Dr. Dimitri at 1:26 PM on 04/22/2024 by secure text page via the [facility] messaging system. Electronically signed by: Lonni Necessary MD 04/22/2024 01:28 PM EST RP Workstation: [id number]    ____________________________________________   PROCEDURES  Procedure(s) performed:   Procedures  None  ____________________________________________   INITIAL IMPRESSION / ASSESSMENT AND PLAN / ED COURSE  Pertinent labs & imaging results that were available during my care of the patient were reviewed by me and considered in my medical decision making (see chart for details).   This patient is Presenting for Evaluation of syncope, which does require a range of treatment options, and is a complaint that involves a high risk of morbidity and mortality.  The Differential Diagnoses includes vasovagal syncope, arrhythmia, CVA, TIA, etc.  Critical Interventions-    Medications - No data to display   Reassessment after intervention:  unchanged symptoms.    I did obtain Additional Historical Information from EMS.  Clinical Laboratory Tests Ordered, included CBC without leukocytosis or anemia.  No acute kidney injury.  Radiologic Tests Ordered, included CT head. I independently interpreted the images and agree with radiology interpretation.   Cardiac Monitor Tracing which shows NSR.    Social Determinants of Health Risk patient is a non-smoker.   Consult complete with Neurology. Cancelled Code CVA. Do not recommend further neuro workup.   Medical Decision Making: Summary:  Patient presents to the emergency department with concern for syncope.  She was noted to have some speech disturbance with EMS and code stroke was activated but this was fairly quickly canceled by EMS.  Favor syncope rather than an acute stroke.  No MRI or other stroke workup advised.  Plan for [employer] ILR interrogation and  likely discharge.   Reevaluation with update and discussion with patient. IRL interrogation pending. Care transferred to Dr. Corinthia.    Patient's presentation is most consistent with acute presentation with potential threat to life or bodily function.   Disposition: pending  ____________________________________________  FINAL CLINICAL IMPRESSION(S) / ED DIAGNOSES  Final diagnoses:  Syncope, unspecified syncope type     Note:  This document was prepared using Dragon voice recognition software and may include unintentional dictation errors.  Fonda Law, MD, [facility]    Sylena Lotter, Fonda MATSU, MD 04/25/24 [phone]  "

## 2024-04-22 NOTE — Consult Note (Signed)
 NEUROLOGY CONSULT NOTE   Date of service: April 22, 2024 Patient Name: Annette Gross MRN:  [id number] DOB:  10-Nov-1940 Chief Complaint: CODE STROKE Requesting Provider: Darra Fonda MATSU, MD  History of Present Illness  Annette Gross is a 83 y.o. female with hx of pAfib (Eliquis ), HTN, HLD, GERD, left CRAO and multiple small acute infarcts scattered within bilateral cerebral hemisphere with strong suspicion for cardioembolic source, punctate right cerebellar infarct  11/2023, s/p ILR who was BIB EMS as a CODE STROKE after a syncopal episode while getting out of the shower. EMS called for fall, assessed slurred speech and intermittent aphasia, activated her as a code stroke.   On arrival to ED, patient was alert, oriented, no aphasia or dysarthria, no weakness, slight sensory deficit to RLE. Patient also mentioned that she recently had a procedure to her right knee, fluid drainage. Denies hitting head or LOC during fall, remembers all events today.   She takes Eliquis  BID, did not take her dose this morning as she was just getting up and taking a shower when symptoms occurred. Eliquis  was started by Cardiology 03/2024, CHA2DS2-VASc Score of 8. Before this, she was on Plavix . She is a [facility] of [facility].   CTH negative. Patient is allergic to contrast and LVO is not likely due to symptom presentation, no CTA completed.   LKW: 1200 Modified rankin score: 1-No significant post stroke disability and can perform usual duties with stroke symptoms IV Thrombolysis: No, low NIH  EVT: No, no LVO suspected  NIHSS components Score: Comment  1a Level of Conscious 0[x]  1[]  2[]  3[]      1b LOC Questions 0[x]  1[]  2[]       1c LOC Commands 0[x]  1[]  2[]       2 Best Gaze 0[x]  1[]  2[]       3 Visual 0[x]  1[]  2[]  3[]      4 Facial Palsy 0[x]  1[]  2[]  3[]      5a Motor Arm - left 0[x]  1[]  2[]  3[]  4[]  UN[]    5b Motor Arm - Right 0[x]  1[]  2[]  3[]  4[]  UN[]    6a Motor Leg - Left 0[x]  1[]  2[]  3[]  4[]  UN[]    6b  Motor Leg - Right 0[x]  1[]  2[]  3[]  4[]  UN[]    7 Limb Ataxia 0[x]  1[]  2[]  UN[]      8 Sensory 0[]  1[x]  2[]  UN[]      9 Best Language 0[x]  1[]  2[]  3[]      10 Dysarthria 0[x]  1[]  2[]  UN[]      11 Extinct. and Inattention 0[x]  1[]  2[]       TOTAL:   1      ROS  Comprehensive ROS performed and pertinent positives documented in HPI   Past History   Past Medical History:  Diagnosis Date   Anemia    many yrs ago   Anxiety    Asthma    no recent issues   Cancer [facility])    s/p thyroidectomy 2018   Complication of anesthesia    Cystitis, interstitial    Degenerative disc disease, lumbar    Degenerative joint disease    spinal stenosis Chronic low back pain   Depression    Diabetes mellitus    diet control.   Diverticulitis 2005   Diverticulosis 2005   Factor V deficiency (HCC)    Fibromyalgia    Fracture of right foot 1995   GERD (gastroesophageal reflux disease)    History of hiatal hernia    Hyperlipidemia    Hypertension  off med x 5 yrs.   IBS (irritable bowel syndrome)    Insomnia    Iron deficiency    Liver hemangioma 1999   MRI   Mononucleosis    Morton's neuroma    Left foot   Nuclear sclerotic cataract of left eye 08/29/2019   Peripheral neuropathy    Pneumonia    years 89 & 89.  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   CHOLECYSTECTOMY     COLONOSCOPY WITH PROPOFOL  N/A 09/02/2015   Procedure: COLONOSCOPY WITH PROPOFOL ;  Surgeon: Gladis MARLA Louder, MD;  Location: [facility];  Service: Endoscopy;  Laterality: N/A;   GANGLION CYST EXCISION     right   HEMORRHOID SURGERY     HERNIA REPAIR     repair was aimed at the [facility]   HIATAL HERNIA REPAIR     and nissen fundoplication   LAPAROSCOPIC ESOPHAGOGASTRIC  FUNDOPLASTY     LAPAROSCOPIC INCISIONAL / UMBILICAL / VENTRAL HERNIA REPAIR     umbilical hernia   LOOP RECORDER INSERTION N/A 09/13/2023   Procedure: LOOP RECORDER INSERTION;  Surgeon: Leverne Charlies Helling, PA-C;  Location: [facility] INVASIVE CV LAB;  Service: Cardiovascular;  Laterality: N/A;   OOPHORECTOMY     left   RIGHT OOPHORECTOMY     '05-laparaoscopic   SHOULDER SURGERY Right    THYROIDECTOMY N/A 07/06/2016   Procedure: TOTAL THYROIDECTOMY;  Surgeon: Krystal Russell, MD;  Location: [facility] OR;  Service: General;  Laterality: N/A;   TONSILLECTOMY     TOTAL THYROIDECTOMY  07/06/2016   TRANSESOPHAGEAL ECHOCARDIOGRAM (CATH LAB) N/A 09/13/2023   Procedure: TRANSESOPHAGEAL ECHOCARDIOGRAM;  Surgeon: Mona Vinie BROCKS, MD;  Location: [facility]VAS[facility] CV [facility];  Service: Cardiovascular;  Laterality: N/A;    Family History: Family History  Problem Relation Age of Onset   Stroke Father    Heart disease Father     Social History  reports that she has never smoked. She has never used smokeless tobacco. She reports that she does not currently use alcohol after a past usage of about 2.0 standard drinks of alcohol per week. She reports that she does not use drugs.  Allergies[1]  Medications  Current Medications[2]  Vitals   Vitals:   04/22/24 1315 04/22/24 1316 04/22/24 1332  BP: 116/88  105/65  Pulse: (!) 105  99  Resp:   17  Temp:   98.1 F (36.7 C)  SpO2: 97%  96%  Weight:  69.8 kg     Body mass index is 30.56 kg/m.   Physical Exam   Constitutional: Appears well-developed and well-nourished.  Cardiovascular: Normal rate and regular rhythm.  Respiratory: Effort normal, non-labored breathing.   Neurologic Examination   Neuro: Mental Status: Patient is awake, alert, oriented to person, place, month, year, and situation. Patient is able to give a clear and coherent history. No signs of aphasia or neglect Cranial Nerves: II: Visual Fields are full. Pupils are equal, round, and reactive to  light.   III,IV, VI: EOMI without ptosis or diploplia.  V: Facial sensation is symmetric to light touch VII: Facial movement is symmetric.  VIII: hearing is intact to voice X: Uvula elevates symmetrically XI: did not assess due  to C collar in place XII: tongue is midline without atrophy or fasciculations.  Motor: Tone is normal. Bulk is normal. 5/5 strength was present in all four extremities.  Sensory: Sensation is decreased to RLE.  Cerebellar: FNF intact bilaterally. No ataxia, but question an essential tremor on left.   Labs/Imaging/Neurodiagnostic studies   CBC:  Recent Labs  Lab May 14, 2024 1310 May 14, 2024 1317  WBC 10.2  --   NEUTROABS 5.6  --   HGB 14.2 14.6  HCT 42.3 43.0  MCV 91.2  --   PLT 451*  --    Basic Metabolic Panel:  Lab Results  Component Value Date   NA 137 2024-05-14   K 3.9 05-14-24   CO2 27 11/27/2023   GLUCOSE 144 (H) May 14, 2024   BUN 9 14-May-2024   CREATININE 0.70 2024/05/14   CALCIUM  8.8 (L) 11/27/2023   GFRNONAA >60 11/27/2023   GFRAA >60 07/10/2016   Lipid Panel:  Lab Results  Component Value Date   LDLCALC 30 09/11/2023   HgbA1c:  Lab Results  Component Value Date   HGBA1C 6.6 (H) 09/11/2023   Urine Drug Screen: No results found for: LABOPIA, COCAINSCRNUR, LABBENZ, AMPHETMU, THCU, LABBARB  Alcohol Level     Component Value Date/Time   ETH <15 09/10/2023 1220   INR  Lab Results  Component Value Date   INR 1.0 09/10/2023   APTT  Lab Results  Component Value Date   APTT 31 09/10/2023   AED levels: No results found for: PHENYTOIN, ZONISAMIDE, LAMOTRIGINE, LEVETIRACETA  CT Head without contrast(Personally reviewed): No acute intracranial abnormality. ASPECTS 10. Moderate generalized atrophy and diffuse white matter disease, similar to prior exams. Lacunar infarcts in the right caudate head and left lentiform nucleus.  ASSESSMENT   Annette Gross is a 83 y.o. female  hx of pAfib (Eliquis ), HTN, HLD,  GERD, recurrent CVA, s/p ILR who was BIB EMS as a CODE STROKE after a syncopal episode while getting out of the shower. EMS  assessed slurred speech and intermittent aphasia, activated her as a code stroke.   On arrival to ED, patient was alert, oriented, no aphasia or dysarthria, no weakness, slight sensory deficit to RLE. NIH: 1. CTH negative. No CTA completed due to allergy and low suspicion for stroke.   Patient is already on Eliquis  for full secondary stroke prevention with history of Afib. She is a patient of [facility] with good followup history, last visit 03/10/2024. LDL/A1c within goal when last checked in May.   RECOMMENDATIONS   Secondary Stroke Prevention - continue Eliquis  - A1c and LDL within goal May 2025, recommend follow-up testing with PCP - continue BP and BG management  - recommend loop recorder interrogation due to syncope. Last checked 12/19 with no AF episodes seen.   ______________________________________________________________________    Bonney Rocky JAYSON Judithe, NP [facility]    I evaluated this patient and generally agree with assessment and plan.  Patient has a history of A-fib, compliant with Eliquis , presents with a presyncopal event in the setting of low SBP in the 80s per EMS; she had some associated slurred speech and difficulty getting words out which prompted EMS to stroke alert patient.  Overall presentation is consistent with a presyncopal event given her low blood pressure.  I recommend continuing her Eliquis .  Presyncopal workup per ED.  No further inpatient stroke workup indicated at this time.    [1]  Allergies Allergen Reactions   Iohexol Anaphylaxis   Ivp Dye [Iodinated Contrast  Media] Anaphylaxis   Kiwi Extract Anaphylaxis   Neurontin [Gabapentin] Other (See Comments)    Auditory hallucinations   Sulfa Antibiotics Hives   Asa [Aspirin ] Other (See Comments)    Stomach pain   Cipro [Ciprofloxacin Hcl] Other (See  Comments)    Tendonitis    Codeine Other (See Comments)    Severe stomach cramps   Lexapro [Escitalopram Oxalate] Other (See Comments)    Myalgias    Lipitor [Atorvastatin] Other (See Comments)    Myalgias    Lomotil [Diphenoxylate-Atropine] Other (See Comments)    Severe stomach pain/cramps Dizziness   Lyrica [Pregabalin] Other (See Comments)    Auditory hallucinations  [2]  Current Facility-Administered Medications:    sodium chloride  flush (NS) 0.9 % injection 3 mL, 3 mL, Intravenous, Once, Long, Joshua G, MD  Current Outpatient Medications:    acetaminophen  (TYLENOL ) 500 MG tablet, Take 1,000 mg by mouth 2 (two) times daily as needed for moderate pain (pain score 4-6) or headache., Disp: , Rfl:    albuterol  (VENTOLIN  HFA) 108 (90 Base) MCG/ACT inhaler, Inhale 1-2 puffs into the lungs every 6 (six) hours as needed for shortness of breath or wheezing., Disp: , Rfl:    apixaban  (ELIQUIS ) 5 MG TABS tablet, Take 1 tablet (5 mg total) by mouth 2 (two) times daily., Disp: 60 tablet, Rfl: 3   ascorbic acid (VITAMIN C) 500 MG tablet, Take 500 mg by mouth daily., Disp: , Rfl:    Cholecalciferol (VITAMIN D-3 PO), Take 1 capsule by mouth daily., Disp: , Rfl:    Cyanocobalamin  (VITAMIN B-12 PO), Take 1 tablet by mouth daily., Disp: , Rfl:    DULoxetine  (CYMBALTA ) 60 MG capsule, Take 60 mg by mouth at bedtime., Disp: , Rfl:    Evolocumab (REPATHA) 140 MG/ML SOSY, Inject 140 mg into the skin See admin instructions. Inject 1 dose (140mg ) subcutaneously every 14 days on every other Tuesday., Disp: , Rfl:    fluticasone  (FLONASE ) 50 MCG/ACT nasal spray, Place 1 spray into the nose daily., Disp: , Rfl:    levocetirizine (XYZAL ) 5 MG tablet, Take 5 mg by mouth at bedtime., Disp: , Rfl: 3   levothyroxine  (SYNTHROID ) 88 MCG tablet, Take 88 mcg by mouth daily before breakfast., Disp: , Rfl:    losartan  (COZAAR ) 25 MG tablet, Take 25 mg by mouth at bedtime., Disp: , Rfl:    meclizine  (ANTIVERT ) 25 MG  tablet, Take 1 tablet (25 mg total) by mouth 3 (three) times daily as needed for dizziness., Disp: 20 tablet, Rfl: 0   metFORMIN  (GLUCOPHAGE ) 500 MG tablet, Take 1,000 mg by mouth 2 (two) times daily after a meal., Disp: , Rfl:    Multiple Vitamins-Minerals (PRESERVISION AREDS 2) CAPS, Take 1 capsule by mouth daily at 12 noon., Disp: , Rfl:    omeprazole (PRILOSEC) 40 MG capsule, Take 40 mg by mouth at bedtime., Disp: , Rfl:

## 2024-04-22 NOTE — Discharge Instructions (Addendum)
 You were evaluated in the emergency department today for syncope  Based on your evaluation, there is no evidence of a serious or life-threatening condition at this time.  However, we recommend that you continue closely monitoring your symptoms and arrange close follow-up with your primary care provider for reevaluation.  Your symptoms may improve on their own, but we recommend follow-up or return to the emergency department if they continue/worsen.  Follow-up: - Follow-up with your primary care provider or a specialist if your symptoms persist, worsen, or you have additional concerns.   - We typically recommend follow-up with your PCP within the next 1-3 days, unless directed otherwise by your medical provider. - If a follow-up appointment has already been scheduled, we recommend you continue to keep that appointment to discuss your recent ED visit  Return to the emergency department if you experience: - Worsening or new symptoms - New fever or fever that persists - Difficulty breathing, chest pain, severe pain, or weakness. - Signs of infection at any wound site (increased redness, warmth, discharge) - You have any other concerns or unexpected changes  Medications: - New medications prescribed by the ED: none - Pain relief: Use over-the-counter pain relievers like ibuprofen or Tylenol  as directed on the package.  Talk to your medical provider if you have certain medical conditions like kidney disease or liver disease to make sure these medications are safe for you to use. - Continue to take all your medications as directed.  Contact your primary care physician (or seek medical care from a medical provider) if you have any side effects or questions  Other instructions: - Drink plenty of fluids to stay hydrated (we recommend water or fluids that contain electrolytes including Gatorade/Powerade/Pedialyte) - Continue to rest and care for yourself at home - Avoid strenuous activity if feeling  unwell - If applicable: consider applying ice packs or a heating pad to the affected area of injury for 20 minutes at a time, 4-5 times a day, for the next 24-48 hours.   Once again, we highly encourage you to contact your primary care provider or return to the emergency department if you have any further concerns or questions.

## 2024-04-26 ENCOUNTER — Other Ambulatory Visit (HOSPITAL_BASED_OUTPATIENT_CLINIC_OR_DEPARTMENT_OTHER): Payer: Self-pay

## 2024-05-07 ENCOUNTER — Ambulatory Visit: Attending: Cardiology

## 2024-05-07 DIAGNOSIS — I639 Cerebral infarction, unspecified: Secondary | ICD-10-CM | POA: Diagnosis not present

## 2024-05-08 ENCOUNTER — Encounter

## 2024-05-08 LAB — CUP PACEART REMOTE DEVICE CHECK
Date Time Interrogation Session: 20260103233232
Implantable Pulse Generator Implant Date: 20250512

## 2024-05-11 NOTE — Progress Notes (Signed)
 Remote Loop Recorder Transmission

## 2024-05-13 ENCOUNTER — Ambulatory Visit: Payer: Self-pay | Admitting: Cardiology

## 2024-05-30 ENCOUNTER — Encounter: Payer: Self-pay | Admitting: Neurology

## 2024-05-30 ENCOUNTER — Ambulatory Visit: Admitting: Neurology

## 2024-05-30 NOTE — Progress Notes (Unsigned)
 "  Patient: Annette Gross Date of Birth: [birth year]  Reason for Visit: Stroke Clinic Follow Up History from: Patient Primary Neurologist: Rosemarie  ASSESSMENT AND PLAN 84 y.o. year old female with stroke in May 2025.  Left eye central retinal artery occlusion and multiple small scattered ischemic infarcts.  Cardioembolic source.  Presented to her ophthalmologist with painless loss of vision to the left eye.  Loop recorder placed 09/13/23.  TEE negative.  Vascular risk factors: HTN, HLD. Incidental 8 mm meningioma overlying the anterior right frontal lobe  -Can stop Plavix , continue aspirin  81 mg daily for secondary stroke prevention - Referral to neurorehab for physical, speech, occupational therapy evaluation  - Strict management of vascular risk factors with a goal BP less than 130/90, A1c less than 7.0, LDL less than 70 for secondary stroke prevention - Unclear etiology of occasional tingling to left V2, Carotid duplex was unrevealing to suggest carotid blockage, as well as very localized area of tingling. Advised to monitor, let me know if more frequent or area increases. May consider CTA neck, but I would think involve more of the face if blockage  - Incidental 8 mm meningioma overlying the anterior right frontal lobe on MRI, will recheck in 6 months  - Follow-up in 6 months or sooner if needed  Addendum 10/21/23 SS: I called the patient to check on tingling to left cheek, has almost completely resolved and she thinks that it was actually present before the stroke. No further action.  HISTORY OF PRESENT ILLNESS: Today 05/30/24   10/14/23 SS: Here with [name], son's partner. They all live together. Continues to have large vision loss to the left eye, but can see crescent shaped light at the top that is getting larger. Saw ophthalmology.  She feels like right eye is fine. No other symptoms. BP was high today 162/81, then good on recheck 120/70 olmesartan. Continues on home Repatha. Goes to [place]  for primary care. Mentions occasionally will have tingling to left side of face every few days, no longer than 30 minutes, only at V2 area radiates behind ear, no headache or dental pain. Mentions prior to stroke had some memory concerns remembering names or hard time finding her words. Would like to get occupational and speech therapy arranged. She doesn't drive since CVA. Other than driving, back to normal activities.   HISTORY Presented to the ER 09/10/2023 after waking with impaired vision to her left eye, she could only see a crescent shaped area at the top of her field of vision.  A right lower quadrantanopsia was also noted.  She went through her ophthalmologist was told she had a central retinal artery occlusion and was sent to the ER.  She was admitted for painless loss of vision to the central field of the left eye.  On MRI found to have multiple embolic appearing ischemic strokes.  NIH 1. BP was elevated, losartan  was started.  Continue home Repatha.  -CT head no acute abnormality.  SVD.  Atrophy - MRI multiple small acute infarcts scattered within bilateral cerebral hemispheres, likely embolic in nature, 8 mm meningioma overlying anterior right frontal lobe, background atrophy and chronic small vessel ischemic disease - MRA no LVO or significant stenosis - Carotid Doppler unremarkable - Bilateral lower extremity ultrasound negative - 2D echo EF 60 to 65% - Loop recorder placed - TEE EF 60 to 65%, negative for PFO - LDL 30 - A1c 6.6 - No antithrombotic prior to admission, 3 weeks DAPT aspirin  81 and  Plavix  75, then aspirin  alone  REVIEW OF SYSTEMS: Out of a complete 14 system review of symptoms, the patient complains only of the following symptoms, and all other reviewed systems are negative.  See HPI  ALLERGIES: Allergies  Allergen Reactions   Iohexol Anaphylaxis   Ivp Dye [Iodinated Contrast Media] Anaphylaxis   Kiwi Extract Anaphylaxis   Neurontin [Gabapentin] Other (See  Comments)    Auditory hallucinations   Sulfa Antibiotics Hives   Asa [Aspirin ] Other (See Comments)    Stomach pain   Cipro [Ciprofloxacin Hcl] Other (See Comments)    Tendonitis    Codeine Other (See Comments)    Severe stomach cramps   Lexapro [Escitalopram Oxalate] Other (See Comments)    Myalgias    Lipitor [Atorvastatin] Other (See Comments)    Myalgias    Lomotil [Diphenoxylate-Atropine] Other (See Comments)    Severe stomach pain/cramps Dizziness   Lyrica [Pregabalin] Other (See Comments)    Auditory hallucinations    HOME MEDICATIONS: Outpatient Medications Prior to Visit  Medication Sig Dispense Refill   acetaminophen  (TYLENOL ) 500 MG tablet Take 1,000 mg by mouth 2 (two) times daily as needed for moderate pain (pain score 4-6) or headache.     albuterol  (VENTOLIN  HFA) 108 (90 Base) MCG/ACT inhaler Inhale 1-2 puffs into the lungs every 6 (six) hours as needed for shortness of breath or wheezing.     apixaban  (ELIQUIS ) 5 MG TABS tablet Take 1 tablet (5 mg total) by mouth 2 (two) times daily. 60 tablet 3   ascorbic acid (VITAMIN C) 500 MG tablet Take 500 mg by mouth daily.     Cholecalciferol (VITAMIN D-3 PO) Take 1 capsule by mouth daily.     Cyanocobalamin  (VITAMIN B-12 PO) Take 1 tablet by mouth daily.     DULoxetine  (CYMBALTA ) 60 MG capsule Take 60 mg by mouth at bedtime.     Evolocumab (REPATHA) 140 MG/ML SOSY Inject 140 mg into the skin See admin instructions. Inject 1 dose (140mg ) subcutaneously every 14 days on every other Tuesday.     fluticasone  (FLONASE ) 50 MCG/ACT nasal spray Place 1 spray into the nose daily.     levocetirizine (XYZAL ) 5 MG tablet Take 5 mg by mouth at bedtime.  3   levothyroxine  (SYNTHROID ) 88 MCG tablet Take 88 mcg by mouth daily before breakfast.     losartan  (COZAAR ) 25 MG tablet Take 25 mg by mouth at bedtime.     meclizine  (ANTIVERT ) 25 MG tablet Take 1 tablet (25 mg total) by mouth 3 (three) times daily as needed for dizziness. 20 tablet  0   metFORMIN  (GLUCOPHAGE ) 500 MG tablet Take 1,000 mg by mouth 2 (two) times daily after a meal.     Multiple Vitamins-Minerals (PRESERVISION AREDS 2) CAPS Take 1 capsule by mouth daily at 12 noon.     omeprazole (PRILOSEC) 40 MG capsule Take 40 mg by mouth at bedtime.     No facility-administered medications prior to visit.    PAST MEDICAL HISTORY: Past Medical History:  Diagnosis Date   Anemia    many yrs ago   Anxiety    Asthma    no recent issues   Cancer [facility])    s/p thyroidectomy 2018   Complication of anesthesia    Cystitis, interstitial    Degenerative disc disease, lumbar    Degenerative joint disease    spinal stenosis Chronic low back pain   Depression    Diabetes mellitus    diet control.  Diverticulitis 2005   Diverticulosis 2005   Factor V deficiency (HCC)    Fibromyalgia    Fracture of right foot 1995   GERD (gastroesophageal reflux disease)    History of hiatal hernia    Hyperlipidemia    Hypertension    off med x 5 yrs.   IBS (irritable bowel syndrome)    Insomnia    Iron deficiency    Liver hemangioma 1999   MRI   Mononucleosis    Morton's neuroma    Left foot   Nuclear sclerotic cataract of left eye 08/29/2019   Peripheral neuropathy    Pneumonia    years 89 & 89.  None since   PONV (postoperative nausea and vomiting)    nausea no vomiting   Rectocele    Transfusion history    child x2, many yrs ago after childbirthhemorrhage   Umbilical hernia 2012    PAST SURGICAL HISTORY: Past Surgical History:  Procedure Laterality Date   blood vessel tumor removal  1980   from chin   CHOLECYSTECTOMY     COLONOSCOPY WITH PROPOFOL  N/A 09/02/2015   Procedure: COLONOSCOPY WITH PROPOFOL ;  Surgeon: Gladis MARLA Louder, MD;  Location: [facility];  Service: Endoscopy;  Laterality: N/A;   GANGLION CYST EXCISION     right   HEMORRHOID SURGERY     HERNIA REPAIR     repair was aimed at the [facility]   HIATAL HERNIA REPAIR     and nissen fundoplication    LAPAROSCOPIC ESOPHAGOGASTRIC FUNDOPLASTY     LAPAROSCOPIC INCISIONAL / UMBILICAL / VENTRAL HERNIA REPAIR     umbilical hernia   LOOP RECORDER INSERTION N/A 09/13/2023   Procedure: LOOP RECORDER INSERTION;  Surgeon: Leverne Charlies Helling, PA-C;  Location: [facility] INVAS[facility] CV LAB;  Service: Cardiovascular;  Laterality: N/A;   OOPHORECTOMY     left   RIGHT OOPHORECTOMY     '05-laparaoscopic   SHOULDER SURGERY Right    THYROIDECTOMY N/A 07/06/2016   Procedure: TOTAL THYROIDECTOMY;  Surgeon: Krystal Russell, MD;  Location: [facility] OR;  Service: General;  Laterality: N/A;   TONSILLECTOMY     TOTAL THYROIDECTOMY  07/06/2016   TRANSESOPHAGEAL ECHOCARDIOGRAM (CATH LAB) N/A 09/13/2023   Procedure: TRANSESOPHAGEAL ECHOCARDIOGRAM;  Surgeon: Mona Vinie BROCKS, MD;  Location: [facility] INVAS[facility] CV [facility];  Service: Cardiovascular;  Laterality: N/A;    FAMILY HISTORY: Family History  Problem Relation Age of Onset   Stroke Father    Heart disease Father     SOCIAL HISTORY: Social History   Socioeconomic History   Marital status: Married    Spouse name: Not on file   Number of children: Not on file   Years of education: Not on file   Highest education level: Not on file  Occupational History   Not on file  Tobacco Use   Smoking status: Never   Smokeless tobacco: Never   Tobacco comments:    Never smoked 03/10/24  Vaping Use   Vaping status: Never Used  Substance and Sexual Activity   Alcohol use: Not Currently    Alcohol/week: 2.0 standard drinks of alcohol    Types: 2 Glasses of wine per week    Comment: social   Drug use: No   Sexual activity: Not on file  Other Topics Concern   Not on file  Social History Narrative   Not on file   Social Drivers of Health   Tobacco Use: Low Risk (04/22/2024)   Patient History    Smoking Tobacco Use: Never  Smokeless Tobacco Use: Never    Passive Exposure: Not on file  Financial Resource Strain: Not on file  Food Insecurity: No Food Insecurity (11/26/2023)    Epic    Worried About Programme Researcher, Broadcasting/film/video in the Last Year: Never true    Ran Out of Food in the Last Year: Never true  Transportation Needs: No Transportation Needs (11/26/2023)   Epic    Lack of Transportation (Medical): No    Lack of Transportation (Non-Medical): No  Physical Activity: Not on file  Stress: Not on file  Social Connections: Socially Isolated (11/26/2023)   Social Connection and Isolation Panel    Frequency of Communication with Friends and Family: Never    Frequency of Social Gatherings with Friends and Family: Never    Attends Religious Services: Never    Database Administrator or Organizations: No    Attends Engineer, Structural: More than 4 times per year    Marital Status: Widowed  Intimate Partner Violence: Not At Risk (11/28/2023)   Epic    Fear of Current or Ex-Partner: No    Emotionally Abused: No    Physically Abused: No    Sexually Abused: No  Depression (PHQ2-9): Not on file  Alcohol Screen: Not on file  Housing: Low Risk (11/28/2023)   Epic    Unable to Pay for Housing in the Last Year: No    Number of Times Moved in the Last Year: 1    Homeless in the Last Year: No  Utilities: Not At Risk (11/26/2023)   Epic    Threatened with loss of utilities: No  Health Literacy: Not on file   PHYSICAL EXAM  There were no vitals filed for this visit.  There is no height or weight on file to calculate BMI.  Generalized: Well developed, in no acute distress  Neurological examination  Mentation: Alert oriented to time, place, history taking. Follows all commands speech and language fluent Cranial nerve II-XII: Pupils were equal round reactive to light. Extraocular movements were full, her right eye compensates, with left eye only, only superior right and left can make out #, nothing at lowers. Facial sensation and strength were normal.  Head turning and shoulder shrug were normal and symmetric. Motor: The motor testing reveals 5 over 5 strength of all 4  extremities. Good symmetric motor tone is noted throughout.  Sensory: Sensory testing is intact to soft touch on all 4 extremities. No evidence of extinction is noted.  Coordination: Cerebellar testing reveals good finger-nose-finger and heel-to-shin bilaterally.  Gait and station: Gait is slightly forward leaning, wide-based, cautious, uses cane Reflexes: Deep tendon reflexes are symmetric but decreased  DIAGNOSTIC DATA (LABS, IMAGING, TESTING) - I reviewed patient records, labs, notes, testing and imaging myself where available.  Lab Results  Component Value Date   WBC 10.2 04/22/2024   HGB 14.6 04/22/2024   HCT 43.0 04/22/2024   MCV 91.2 04/22/2024   PLT 451 (H) 04/22/2024      Component Value Date/Time   NA 137 04/22/2024 1317   K 3.9 04/22/2024 1317   CL 98 04/22/2024 1317   CO2 26 04/22/2024 1310   GLUCOSE 144 (H) 04/22/2024 1317   BUN 9 04/22/2024 1317   CREATININE 0.70 04/22/2024 1317   CALCIUM  9.0 04/22/2024 1310   PROT 6.4 (L) 04/22/2024 1310   ALBUMIN 3.9 04/22/2024 1310   AST 17 04/22/2024 1310   ALT 9 04/22/2024 1310   ALKPHOS 99 04/22/2024 1310   BILITOT  0.4 04/22/2024 1310   GFRNONAA >60 04/22/2024 1310   GFRAA >60 07/10/2016 1002   Lab Results  Component Value Date   CHOL 128 09/11/2023   HDL 52 09/11/2023   LDLCALC 30 09/11/2023   TRIG 230 (H) 09/11/2023   CHOLHDL 2.5 09/11/2023   Lab Results  Component Value Date   HGBA1C 6.6 (H) 09/11/2023   No results found for: [id number] Lab Results  Component Value Date   TSH 2.675 06/29/2016    Lauraine Born, AGNP-C, DNP 05/30/2024, 10:06 AM [facility] [address], [address] [place], [place] [address] [phone]   "

## 2024-06-07 ENCOUNTER — Ambulatory Visit

## 2024-06-07 LAB — CUP PACEART REMOTE DEVICE CHECK
Date Time Interrogation Session: 20260203235433
Implantable Pulse Generator Implant Date: 20250512

## 2024-06-08 ENCOUNTER — Telehealth: Payer: Self-pay | Admitting: Neurology

## 2024-06-08 NOTE — Telephone Encounter (Signed)
 Request to reschedule appointment

## 2024-06-12 ENCOUNTER — Ambulatory Visit (HOSPITAL_COMMUNITY): Admitting: Internal Medicine

## 2024-06-20 ENCOUNTER — Encounter

## 2024-07-08 ENCOUNTER — Ambulatory Visit

## 2024-08-08 ENCOUNTER — Ambulatory Visit

## 2024-09-08 ENCOUNTER — Ambulatory Visit

## 2024-09-19 ENCOUNTER — Encounter

## 2024-10-09 ENCOUNTER — Ambulatory Visit

## 2024-11-09 ENCOUNTER — Ambulatory Visit

## 2024-12-10 ENCOUNTER — Ambulatory Visit

## 2024-12-19 ENCOUNTER — Encounter

## 2024-12-27 ENCOUNTER — Ambulatory Visit: Admitting: Neurology

## 2025-01-10 ENCOUNTER — Ambulatory Visit

## 2025-02-10 ENCOUNTER — Ambulatory Visit

## 2025-03-13 ENCOUNTER — Ambulatory Visit

## 2025-03-20 ENCOUNTER — Encounter

## 2025-04-13 ENCOUNTER — Ambulatory Visit

## 2025-05-14 ENCOUNTER — Ambulatory Visit
